# Patient Record
Sex: Female | Born: 1955 | Race: White | Hispanic: No | Marital: Single
Health system: Midwestern US, Community
[De-identification: ages and names within clinical notes are randomized; demographics above are authoritative.]

## PROBLEM LIST (undated history)

## (undated) DIAGNOSIS — R002 Palpitations: Secondary | ICD-10-CM

## (undated) DIAGNOSIS — K469 Unspecified abdominal hernia without obstruction or gangrene: Secondary | ICD-10-CM

## (undated) DIAGNOSIS — I8500 Esophageal varices without bleeding: Secondary | ICD-10-CM

## (undated) DIAGNOSIS — I351 Nonrheumatic aortic (valve) insufficiency: Secondary | ICD-10-CM

## (undated) DIAGNOSIS — D509 Iron deficiency anemia, unspecified: Secondary | ICD-10-CM

## (undated) DIAGNOSIS — R188 Other ascites: Secondary | ICD-10-CM

## (undated) DIAGNOSIS — F329 Major depressive disorder, single episode, unspecified: Secondary | ICD-10-CM

## (undated) DIAGNOSIS — I10 Essential (primary) hypertension: Secondary | ICD-10-CM

## (undated) DIAGNOSIS — K769 Liver disease, unspecified: Secondary | ICD-10-CM

## (undated) DIAGNOSIS — R519 Headache, unspecified: Secondary | ICD-10-CM

## (undated) DIAGNOSIS — E119 Type 2 diabetes mellitus without complications: Secondary | ICD-10-CM

## (undated) DIAGNOSIS — B182 Chronic viral hepatitis C: Secondary | ICD-10-CM

## (undated) DIAGNOSIS — L820 Inflamed seborrheic keratosis: Secondary | ICD-10-CM

## (undated) DIAGNOSIS — K219 Gastro-esophageal reflux disease without esophagitis: Secondary | ICD-10-CM

## (undated) DIAGNOSIS — K7460 Unspecified cirrhosis of liver: Secondary | ICD-10-CM

## (undated) DIAGNOSIS — N390 Urinary tract infection, site not specified: Secondary | ICD-10-CM

## (undated) DIAGNOSIS — F32A Depression, unspecified: Secondary | ICD-10-CM

## (undated) DIAGNOSIS — E785 Hyperlipidemia, unspecified: Secondary | ICD-10-CM

## (undated) DIAGNOSIS — K766 Portal hypertension: Secondary | ICD-10-CM

## (undated) DIAGNOSIS — Q6102 Congenital multiple renal cysts: Secondary | ICD-10-CM

## (undated) DIAGNOSIS — B1920 Unspecified viral hepatitis C without hepatic coma: Secondary | ICD-10-CM

## (undated) DIAGNOSIS — Z889 Allergy status to unspecified drugs, medicaments and biological substances status: Secondary | ICD-10-CM

## (undated) DIAGNOSIS — G5780 Other specified mononeuropathies of unspecified lower limb: Secondary | ICD-10-CM

## (undated) DIAGNOSIS — R51 Headache: Secondary | ICD-10-CM

## (undated) DIAGNOSIS — I499 Cardiac arrhythmia, unspecified: Secondary | ICD-10-CM

## (undated) DIAGNOSIS — K922 Gastrointestinal hemorrhage, unspecified: Secondary | ICD-10-CM

## (undated) DIAGNOSIS — D61818 Other pancytopenia: Secondary | ICD-10-CM

## (undated) DIAGNOSIS — R161 Splenomegaly, not elsewhere classified: Secondary | ICD-10-CM

## (undated) HISTORY — PX: OTHER SURGICAL HISTORY: SHX169

## (undated) HISTORY — PX: WISDOM TOOTH EXTRACTION: SHX21

## (undated) HISTORY — PX: ABDOMINAL HYSTERECTOMY: SHX81

## (undated) HISTORY — PX: CHOLECYSTECTOMY: SHX55

---

## 2017-11-14 NOTE — Progress Notes (Signed)
Formatting of this note might be different from the original.  ? The patient is here today for suture removal. Surgery was performed on 10/30/2017. Surgery location: right side lower back.  ?  Patient has no complaints.  ? Physical Exam: Surgery site healing appropriately with no signs of infection, bleeding, or dehiscence.  ? Sutures removed without complication; Wound edges remained well approximated after suture removal. Patient to return to the clinic: prn or as scheduled. Continue routine wound care and monitor wound for signs of infection. Patient to call with any questions or concerns.      Electronically signed by Hilarie Fredrickson, LPN at 81/19/1478  3:59 PM EST

## 2017-11-21 ENCOUNTER — Other Ambulatory Visit: Payer: Self-pay

## 2017-11-21 ENCOUNTER — Encounter: Payer: Self-pay | Admitting: Emergency Medicine

## 2017-11-21 ENCOUNTER — Emergency Department: Payer: Federal, State, Local not specified - PPO

## 2017-11-21 ENCOUNTER — Inpatient Hospital Stay
Admission: EM | Admit: 2017-11-21 | Discharge: 2017-11-23 | DRG: 369 | Disposition: A | Discharge status: Home / Self Care | Payer: Federal, State, Local not specified - PPO | Attending: Internal Medicine | Admitting: Internal Medicine

## 2017-11-21 DIAGNOSIS — R161 Splenomegaly, not elsewhere classified: Secondary | ICD-10-CM | POA: Diagnosis present

## 2017-11-21 DIAGNOSIS — K3189 Other diseases of stomach and duodenum: Secondary | ICD-10-CM | POA: Diagnosis present

## 2017-11-21 DIAGNOSIS — E119 Type 2 diabetes mellitus without complications: Secondary | ICD-10-CM | POA: Diagnosis present

## 2017-11-21 DIAGNOSIS — K766 Portal hypertension: Secondary | ICD-10-CM | POA: Diagnosis present

## 2017-11-21 DIAGNOSIS — R110 Nausea: Secondary | ICD-10-CM | POA: Diagnosis not present

## 2017-11-21 DIAGNOSIS — Z8619 Personal history of other infectious and parasitic diseases: Secondary | ICD-10-CM | POA: Diagnosis not present

## 2017-11-21 DIAGNOSIS — Z885 Allergy status to narcotic agent status: Secondary | ICD-10-CM

## 2017-11-21 DIAGNOSIS — E876 Hypokalemia: Secondary | ICD-10-CM | POA: Diagnosis not present

## 2017-11-21 DIAGNOSIS — K922 Gastrointestinal hemorrhage, unspecified: Secondary | ICD-10-CM

## 2017-11-21 DIAGNOSIS — D61818 Other pancytopenia: Secondary | ICD-10-CM | POA: Diagnosis present

## 2017-11-21 DIAGNOSIS — R5381 Other malaise: Secondary | ICD-10-CM | POA: Diagnosis not present

## 2017-11-21 DIAGNOSIS — Z886 Allergy status to analgesic agent status: Secondary | ICD-10-CM

## 2017-11-21 DIAGNOSIS — D509 Iron deficiency anemia, unspecified: Secondary | ICD-10-CM | POA: Diagnosis present

## 2017-11-21 DIAGNOSIS — R197 Diarrhea, unspecified: Secondary | ICD-10-CM | POA: Diagnosis present

## 2017-11-21 DIAGNOSIS — R946 Abnormal results of thyroid function studies: Secondary | ICD-10-CM | POA: Diagnosis present

## 2017-11-21 DIAGNOSIS — R5383 Other fatigue: Secondary | ICD-10-CM | POA: Diagnosis not present

## 2017-11-21 DIAGNOSIS — I8501 Esophageal varices with bleeding: Secondary | ICD-10-CM | POA: Diagnosis present

## 2017-11-21 DIAGNOSIS — I10 Essential (primary) hypertension: Secondary | ICD-10-CM | POA: Diagnosis present

## 2017-11-21 DIAGNOSIS — R42 Dizziness and giddiness: Secondary | ICD-10-CM | POA: Diagnosis not present

## 2017-11-21 DIAGNOSIS — K921 Melena: Secondary | ICD-10-CM | POA: Diagnosis not present

## 2017-11-21 DIAGNOSIS — R21 Rash and other nonspecific skin eruption: Secondary | ICD-10-CM | POA: Diagnosis present

## 2017-11-21 DIAGNOSIS — Z79899 Other long term (current) drug therapy: Secondary | ICD-10-CM | POA: Diagnosis not present

## 2017-11-21 DIAGNOSIS — K7460 Unspecified cirrhosis of liver: Secondary | ICD-10-CM | POA: Diagnosis present

## 2017-11-21 DIAGNOSIS — B1920 Unspecified viral hepatitis C without hepatic coma: Secondary | ICD-10-CM | POA: Diagnosis not present

## 2017-11-21 DIAGNOSIS — R531 Weakness: Secondary | ICD-10-CM | POA: Diagnosis not present

## 2017-11-21 DIAGNOSIS — Z880 Allergy status to penicillin: Secondary | ICD-10-CM | POA: Diagnosis not present

## 2017-11-21 HISTORY — DX: Unspecified viral hepatitis C without hepatic coma: B19.20

## 2017-11-21 HISTORY — DX: Type 2 diabetes mellitus without complications: E11.9

## 2017-11-21 HISTORY — DX: Essential (primary) hypertension: I10

## 2017-11-21 LAB — DIFFERENTIAL
BASOS ABS: 0 10*3/uL (ref 0–0.1)
BLASTS: 0 %
Band Neutrophils: 0 %
Basophils Relative: 0 %
Eosinophils Absolute: 0.2 10*3/uL (ref 0–0.7)
Eosinophils Relative: 10 %
LYMPHS ABS: 0.4 10*3/uL — AB (ref 1.0–3.6)
Lymphocytes Relative: 16 %
METAMYELOCYTES PCT: 0 %
MYELOCYTES: 0 %
Monocytes Absolute: 0.2 10*3/uL (ref 0.2–0.9)
Monocytes Relative: 10 %
NEUTROS PCT: 64 %
NRBC: 0 /100{WBCs}
Neutro Abs: 1.5 10*3/uL (ref 1.4–6.5)
Other: 0 %
PROMYELOCYTES ABS: 0 %

## 2017-11-21 LAB — URINALYSIS, COMPLETE (UACMP) WITH MICROSCOPIC
Bilirubin Urine: NEGATIVE
Glucose, UA: NEGATIVE mg/dL
KETONES UR: NEGATIVE mg/dL
Leukocytes, UA: NEGATIVE
Nitrite: NEGATIVE
PH: 6 (ref 5.0–8.0)
Protein, ur: NEGATIVE mg/dL
Specific Gravity, Urine: 1.032 — ABNORMAL HIGH (ref 1.005–1.030)

## 2017-11-21 LAB — COMPREHENSIVE METABOLIC PANEL
ALT: 24 U/L (ref 14–54)
AST: 55 U/L — AB (ref 15–41)
Albumin: 2.8 g/dL — ABNORMAL LOW (ref 3.5–5.0)
Alkaline Phosphatase: 131 U/L — ABNORMAL HIGH (ref 38–126)
Anion gap: 7 (ref 5–15)
BUN: 10 mg/dL (ref 6–20)
CHLORIDE: 106 mmol/L (ref 101–111)
CO2: 19 mmol/L — ABNORMAL LOW (ref 22–32)
CREATININE: 0.54 mg/dL (ref 0.44–1.00)
Calcium: 8.9 mg/dL (ref 8.9–10.3)
GFR calc Af Amer: >60 mL/min (ref >60)
GFR calc non Af Amer: >60 mL/min (ref >60)
Glucose, Bld: 167 mg/dL — ABNORMAL HIGH (ref 65–99)
Potassium: 3.5 mmol/L (ref 3.5–5.1)
SODIUM: 132 mmol/L — AB (ref 135–145)
Total Bilirubin: 2.1 mg/dL — ABNORMAL HIGH (ref 0.3–1.2)
Total Protein: 7.4 g/dL (ref 6.5–8.1)

## 2017-11-21 LAB — CBC
HCT: 32.5 % — ABNORMAL LOW (ref 35.0–47.0)
Hemoglobin: 10.6 g/dL — ABNORMAL LOW (ref 12.0–16.0)
MCH: 28.6 pg (ref 26.0–34.0)
MCHC: 32.5 g/dL (ref 32.0–36.0)
MCV: 87.8 fL (ref 80.0–100.0)
PLATELETS: 64 10*3/uL — AB (ref 150–440)
RBC: 3.7 MIL/uL — AB (ref 3.80–5.20)
RDW: 19.1 % — AB (ref 11.5–14.5)
WBC: 2.3 10*3/uL — ABNORMAL LOW (ref 3.6–11.0)

## 2017-11-21 LAB — GLUCOSE, CAPILLARY
GLUCOSE-CAPILLARY: 180 mg/dL — AB (ref 65–99)
Glucose-Capillary: 171 mg/dL — ABNORMAL HIGH (ref 65–99)

## 2017-11-21 LAB — LIPASE, BLOOD: LIPASE: 136 U/L — AB (ref 11–51)

## 2017-11-21 LAB — RETICULOCYTES
RBC.: 3.33 MIL/uL — ABNORMAL LOW (ref 3.80–5.20)
Retic Count, Absolute: 83.3 10*3/uL (ref 19.0–183.0)
Retic Ct Pct: 2.5 % (ref 0.4–3.1)

## 2017-11-21 LAB — TSH: TSH: 0.02 u[IU]/mL — ABNORMAL LOW (ref 0.350–4.500)

## 2017-11-21 LAB — FOLATE: Folate: 10 ng/mL (ref >5.9)

## 2017-11-21 LAB — IRON AND TIBC
Iron: 61 ug/dL (ref 28–170)
SATURATION RATIOS: 17 % (ref 10.4–31.8)
TIBC: 359 ug/dL (ref 250–450)
UIBC: 298 ug/dL

## 2017-11-21 LAB — FERRITIN: FERRITIN: 28 ng/mL (ref 11–307)

## 2017-11-21 LAB — HEMOGLOBIN: HEMOGLOBIN: 9.8 g/dL — AB (ref 12.0–16.0)

## 2017-11-21 IMAGING — CT CT ABD-PELV W/ CM
2 of 5 series · 14 of 46 positions shown, 16 images · IV contrast (APPLIED)
Comparison: None.

CLINICAL DATA: 61-year-old with current history of hepatitis-C,
hypertension and diabetes, presenting with acute onset of diarrhea
today.

EXAM:
CT ABDOMEN AND PELVIS WITH CONTRAST
TECHNIQUE: Multidetector CT imaging of the abdomen and pelvis was performed
using the standard protocol following bolus administration of
intravenous contrast.
CONTRAST:  100mL [XV] IOPAMIDOL INJECTION 61% IV. Oral
contrast was not administered.

[Series 2: axial st · axial · 0.73mm/px · z∈[-1158,-768]mm · 11 of 90 slices shown, 13 images]
[im 6/90  soft-tissue]
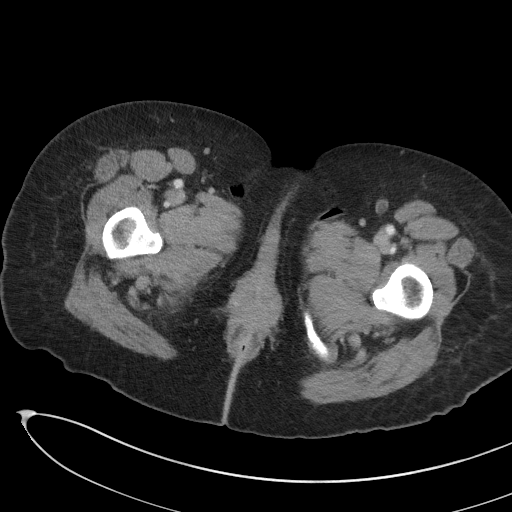
[im 6/90  bone]
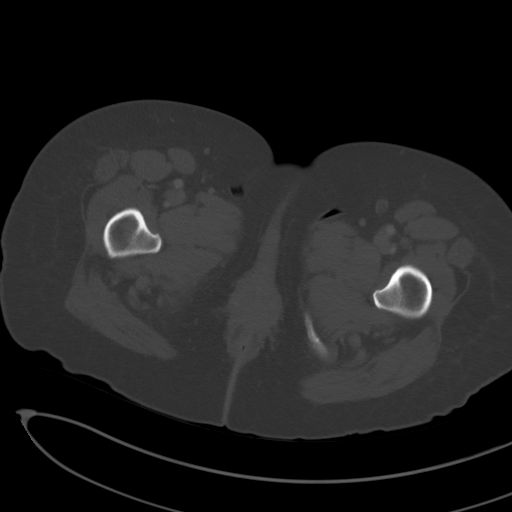
[im 17/90  soft-tissue]
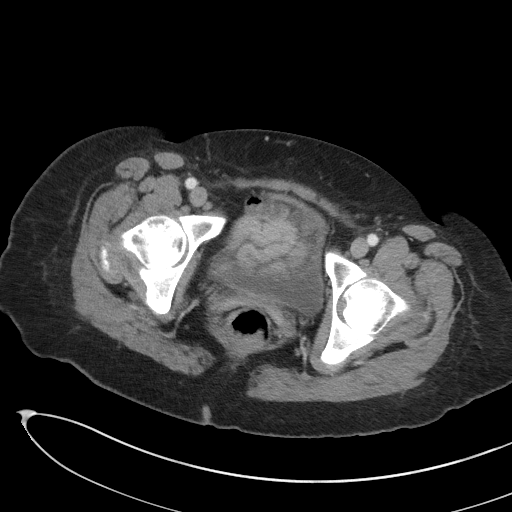
[im 23/90  soft-tissue]
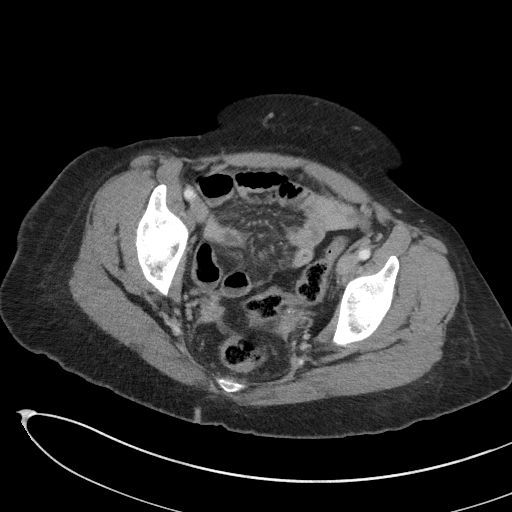
[im 28/90  soft-tissue]
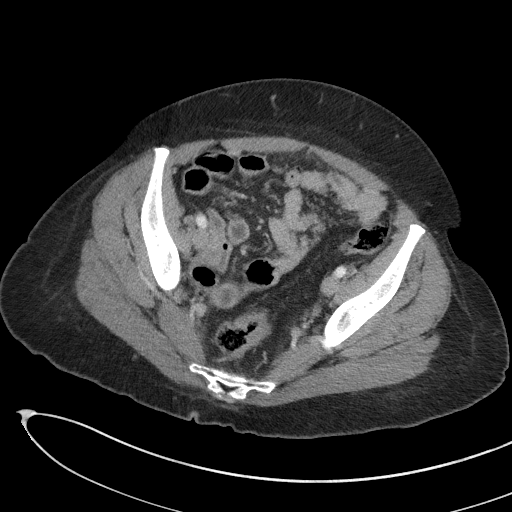
[im 39/90  soft-tissue]
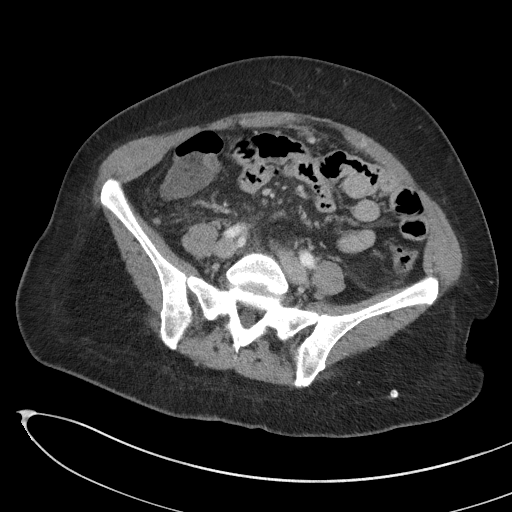
[im 45/90  soft-tissue]
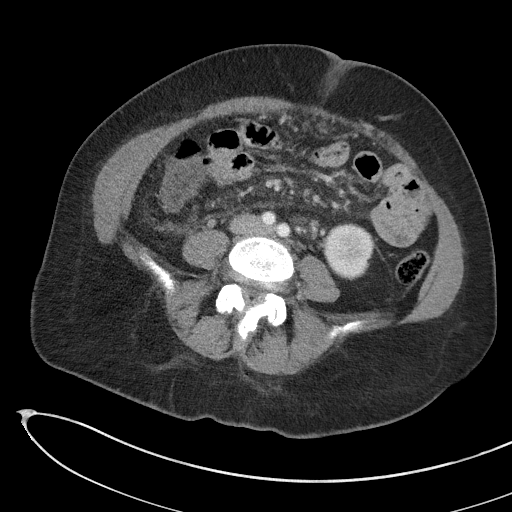
[im 51/90  soft-tissue]
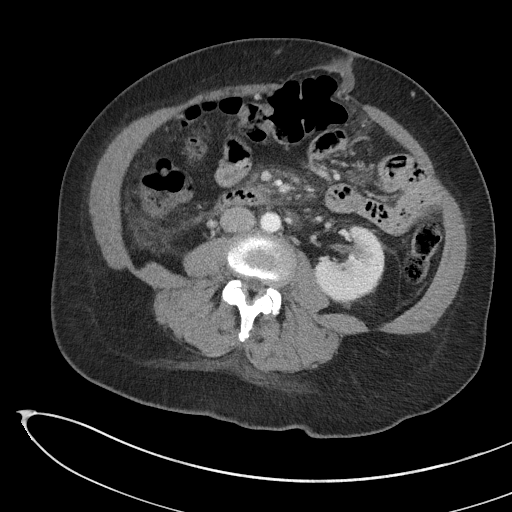
[im 62/90  soft-tissue]
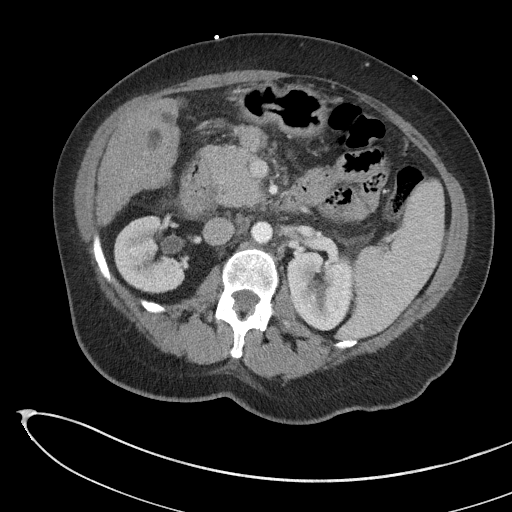
[im 67/90  soft-tissue]
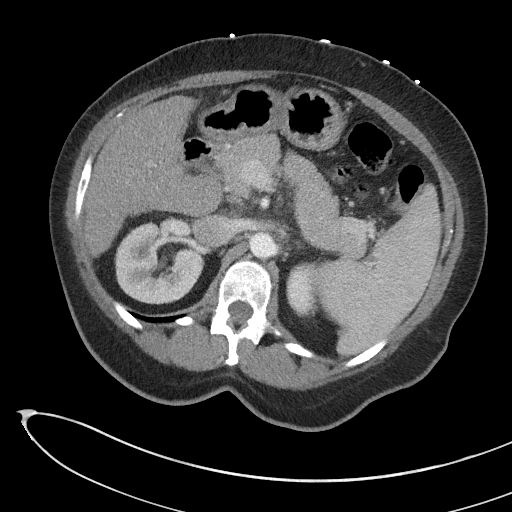
[im 67/90  bone]
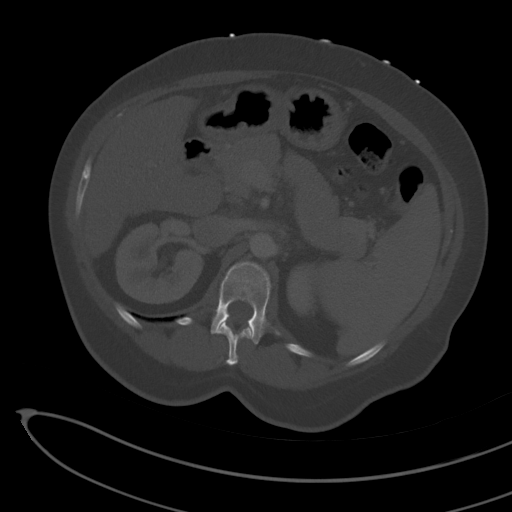
[im 73/90  soft-tissue]
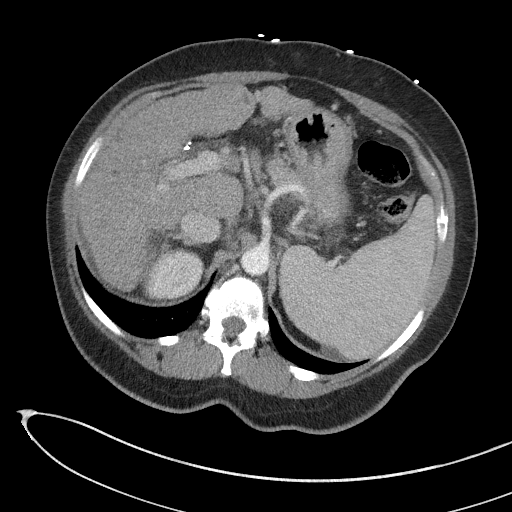
[im 84/90  soft-tissue]
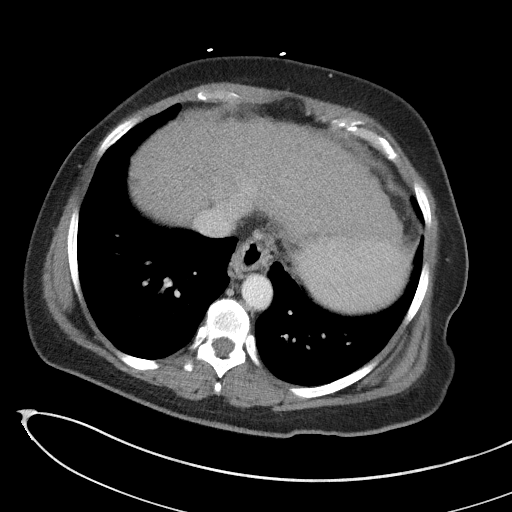

[Series 5: coronal st · coronal · 0.68mm/px · 3 of 96 slices shown]
[im 32/96  soft-tissue]
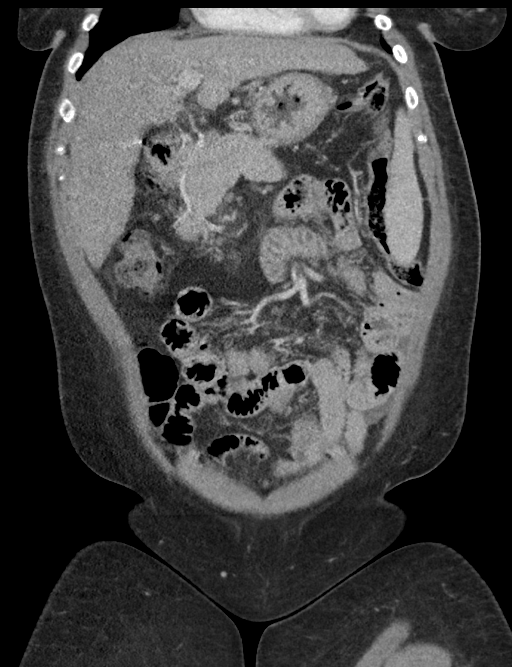
[im 43/96  soft-tissue]
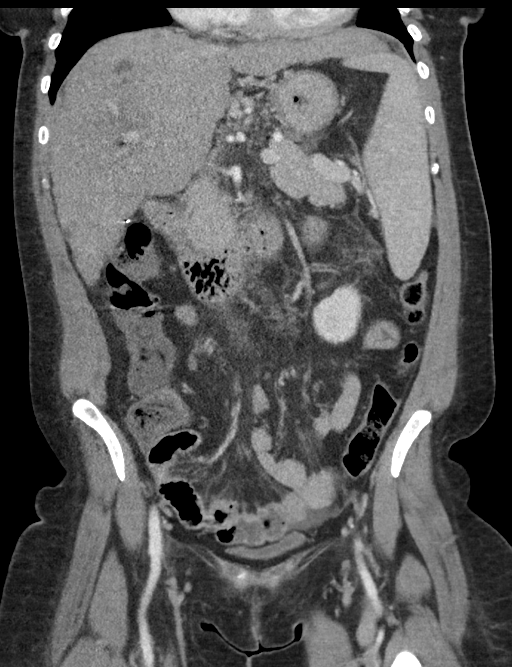
[im 53/96  soft-tissue]
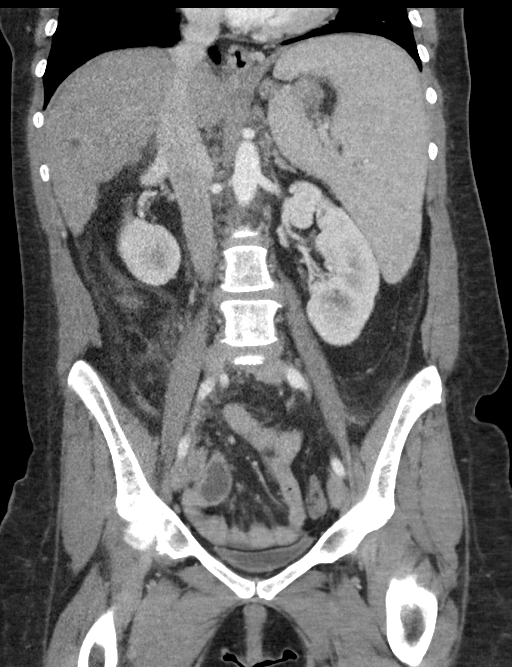

[14 of 46 positions shown; findings below may reference images not displayed]

FINDINGS: Lower chest: Heart size normal. Visualized lung bases clear. Varices
involving the distal esophagus.

Hepatobiliary: Irregularity of the hepatic contour and marked
enlargement of the caudate lobe. Multiple low-attenuation masses
throughout both lobes of the liver, the largest measuring
approximately 1.5 cm in the posterior segment right lobe. The
largest masses represent cysts with Hounsfield units 0-10, making it
likely that the smaller masses are cysts as well. Mild diffuse
hepatic steatosis.

Surgically absent gallbladder. No unexpected biliary ductal
dilation.

Pancreas: Normal in appearance without evidence of mass, ductal
dilation, or inflammation.

Spleen: Splenomegaly, with the spleen measuring approximately 13.7 x
6.4 x 17.3 cm, yielding a volume of approximately 758 mL. No focal
splenic parenchymal abnormality.

Adrenals/Urinary Tract: Normal appearing adrenal glands. Mild right
hydronephrosis without evidence of right ureteral dilation. Mild
left hydronephrosis without evidence of left ureteral dilation. No
urinary tract calculi on either side. Benign cyst involving the
lower pole of the left kidney measuring approximately 1.1 x 1.9 cm.
No solid mass involving either kidney. Normal appearing decompressed
urinary bladder.

Stomach/Bowel: Stomach decompressed and unremarkable.
Normal-appearing small bowel. Expected colonic stool burden. Liquid
stool in the ascending colon. No focal abnormality involving the
colon. Normal appendix in the right upper pelvis.

Vascular/Lymphatic: No visible aortoiliofemoral atherosclerosis.
Normal-appearing portal venous and systemic venous systems. Gastric
varices along the lesser curvature. Recanalization of the umbilical
vein.

No pathologic lymphadenopathy.

Reproductive: Uterus not visualized and presumed surgically absent.
No adnexal masses.

Other: Periumbilical hernia containing the gas-filled transverse
colon without evidence of incarceration. Adjacent umbilical hernia
containing fat.

Edema at the root of the mesentery.  No ascites.

Musculoskeletal: Degenerative disc disease and spondylosis at
T11-12. Facet degenerative changes on the right at L3-4 and L4-5.
Severe multifactorial spinal stenosis at L4-5.
IMPRESSION: 1. Hepatic cirrhosis. Multiple hepatic masses, the largest of which
are cysts, making it likely that the smaller masses are also cysts.
2. Patent portal vein and patent splenic vein.
3. Portal venous hypertension with gastric varices, esophageal
varices and marked splenomegaly.
4. Mild bilateral hydronephrosis, right greater than left, without
associated ureteral dilation. Therefore, congenital mild UPJ
stenoses are likely. No evidence of urinary tract calculi.
5. Periumbilical hernia containing a portion of the gas-filled
transverse colon without evidence of incarceration.
6. Nonspecific edema at the root of the mesentery. This is likely
related to the portal hypertension.
7. Multifactorial spinal stenosis at L4-5.

## 2017-11-21 MED ORDER — ACETAMINOPHEN 650 MG RE SUPP: 650.0000 mg | Freq: Four times a day (QID) | RECTAL | Status: DC | PRN

## 2017-11-21 MED ORDER — SENNOSIDES-DOCUSATE SODIUM 8.6-50 MG PO TABS: 1.0000 | ORAL_TABLET | Freq: Every evening | ORAL | Status: DC | PRN

## 2017-11-21 MED ORDER — ONDANSETRON HCL 4 MG PO TABS: 4.0000 mg | ORAL_TABLET | Freq: Four times a day (QID) | ORAL | Status: DC | PRN

## 2017-11-21 MED ORDER — ACETAMINOPHEN 325 MG PO TABS: 650.0000 mg | ORAL_TABLET | Freq: Four times a day (QID) | ORAL | Status: DC | PRN

## 2017-11-21 MED ORDER — SODIUM CHLORIDE 0.9 % IV BOLUS (SEPSIS)
1000.0000 mL | Freq: Once | INTRAVENOUS | Status: AC
  Administered 2017-11-21: 1000 mL via INTRAVENOUS

## 2017-11-21 MED ORDER — TRAMADOL HCL 50 MG PO TABS
50.0000 mg | ORAL_TABLET | Freq: Four times a day (QID) | ORAL | Status: DC | PRN
  Administered 2017-11-22: 13:00:00 50 mg via ORAL
  Filled 2017-11-21: qty 1

## 2017-11-21 MED ORDER — PANTOPRAZOLE SODIUM 40 MG IV SOLR: 40.0000 mg | Freq: Two times a day (BID) | INTRAVENOUS | Status: DC

## 2017-11-21 MED ORDER — INSULIN ASPART 100 UNIT/ML ~~LOC~~ SOLN: 0.0000 [IU] | Freq: Three times a day (TID) | SUBCUTANEOUS | Status: DC

## 2017-11-21 MED ORDER — HYDRALAZINE HCL 20 MG/ML IJ SOLN: 10.0000 mg | Freq: Four times a day (QID) | INTRAMUSCULAR | Status: DC | PRN

## 2017-11-21 MED ORDER — PANTOPRAZOLE SODIUM 40 MG IV SOLR
80.0000 mg | Freq: Once | INTRAVENOUS | Status: AC
  Administered 2017-11-21: 80 mg via INTRAVENOUS
  Filled 2017-11-21: qty 80

## 2017-11-21 MED ORDER — AMLODIPINE BESYLATE 5 MG PO TABS
5.0000 mg | ORAL_TABLET | Freq: Every day | ORAL | Status: DC
  Administered 2017-11-21 – 2017-11-23 (×3): 5 mg via ORAL
  Filled 2017-11-21 (×3): qty 1

## 2017-11-21 MED ORDER — SODIUM CHLORIDE 0.9 % IV SOLN
8.0000 mg/h | INTRAVENOUS | Status: DC
  Administered 2017-11-21 – 2017-11-23 (×4): 8 mg/h via INTRAVENOUS
  Filled 2017-11-21 (×5): qty 80

## 2017-11-21 MED ORDER — ONDANSETRON HCL 4 MG/2ML IJ SOLN: 4.0000 mg | Freq: Four times a day (QID) | INTRAMUSCULAR | Status: DC | PRN

## 2017-11-21 MED ORDER — SODIUM CHLORIDE 0.9 % IV SOLN
INTRAVENOUS | Status: DC
  Administered 2017-11-21 – 2017-11-23 (×4): via INTRAVENOUS

## 2017-11-21 MED ORDER — BISACODYL 10 MG RE SUPP: 10.0000 mg | Freq: Every day | RECTAL | Status: DC | PRN

## 2017-11-21 MED ORDER — IOPAMIDOL (ISOVUE-300) INJECTION 61%
100.0000 mL | Freq: Once | INTRAVENOUS | Status: AC | PRN
  Administered 2017-11-21: 100 mL via INTRAVENOUS

## 2017-11-21 MED ORDER — INSULIN ASPART 100 UNIT/ML ~~LOC~~ SOLN
0.0000 [IU] | Freq: Three times a day (TID) | SUBCUTANEOUS | Status: DC
  Administered 2017-11-22: 18:00:00 1 [IU] via SUBCUTANEOUS
  Filled 2017-11-21: qty 1

## 2017-11-21 NOTE — ED Notes (Signed)
Patient transported to 128 

## 2017-11-21 NOTE — ED Provider Notes (Signed)
[facility] Emergency Department Provider Note  ____________________________________________  Time seen: Approximately 3:35 PM  I have reviewed the triage vital signs and the nursing notes.   HISTORY  Chief Complaint Diarrhea   HPI Roberta Lane is a 62 y.o. female with history of diabetes, fully treated hepatitis C in 2017, and hypertension who presents for evaluation of diarrhea. Patient reports intermittent diarrhea for 10 days. For the last 3 days the diarrhea has become more pronounced. Today she had 7 episodes of diarrhea. She describes the diarrhea as blackand watery. No prior h/o GIB, not on blood thinners, no NSAID use. She also reports mild RLQ abdominal pain yesterday which she describes as dull and constant but that has resolved. No recent abx use no h/o C. Diff. Patient recently moved here from [place] and has no prior medical records with her. no vomiting, fever, chest pain, shortness of breath, dysuria.  Past Medical History:  Diagnosis Date  . Diabetes mellitus without complication ([place])   . Hepatitis C   . Hypertension     Patient Active Problem List   Diagnosis Date Noted  . GIB (gastrointestinal bleeding) 11/21/2017    History reviewed. No pertinent surgical history.  Prior to Admission medications   Medication Sig Start Date End Date Taking? Authorizing Provider  enalapril (VASOTEC) 2.5 MG tablet Take 2.5 mg by mouth daily.   Yes [provider]  metFORMIN (GLUCOPHAGE) 1000 MG tablet Take 1,000 mg by mouth 2 (two) times daily.   Yes [provider]    Allergies Penicillins; Codeine; and Tylenol [acetaminophen]  No family history on file.  Social History Social History   Tobacco Use  . Smoking status: Never Smoker  . Smokeless tobacco: Never Used  Substance Use Topics  . Alcohol use: Yes  . Drug use: No    Review of Systems  Constitutional: Negative for fever. Eyes: Negative for visual  changes. ENT: Negative for sore throat. Neck: No neck pain  Cardiovascular: Negative for chest pain. Respiratory: Negative for shortness of breath. Gastrointestinal: + abdominal pain and diarrhea. No vomiting  Genitourinary: Negative for dysuria. Musculoskeletal: Negative for back pain. Skin: Negative for rash. Neurological: Negative for headaches, weakness or numbness. Psych: No SI or HI  ____________________________________________   PHYSICAL EXAM:  VITAL SIGNS: ED Triage Vitals  Enc Vitals Group     BP 11/21/17 1316 (!) 150/70     Pulse Rate 11/21/17 1316 94     Resp 11/21/17 1316 20     Temp 11/21/17 1316 99.4 F (37.4 C)     Temp Source 11/21/17 1316 Oral     SpO2 11/21/17 1316 96 %     Weight 11/21/17 1315 165 lb (74.8 kg)     Height 11/21/17 1315 5\' 4"  (1.626 m)     Head Circumference --      Peak Flow --      Pain Score 11/21/17 1330 5     Pain Loc --      Pain Edu? --      Excl. in [place]? --     Constitutional: Alert and oriented. Well appearing and in no apparent distress. HEENT:      Head: Normocephalic and atraumatic.         Eyes: Conjunctivae are normal. Sclera is non-icteric.       Mouth/Throat: Mucous membranes are moist.       Neck: Supple with no signs of meningismus. Cardiovascular: Regular rate and rhythm. No murmurs, gallops, or  rubs. 2+ symmetrical distal pulses are present in all extremities. No JVD. Respiratory: Normal respiratory effort. Lungs are clear to auscultation bilaterally. No wheezes, crackles, or rhonchi.  Gastrointestinal: Soft, non tender, and non distended with positive bowel sounds. No rebound or guarding. Genitourinary: No CVA tenderness. rectal exam showing dark stool guaiac positive Musculoskeletal: Nontender with normal range of motion in all extremities. No edema, cyanosis, or erythema of extremities. Neurologic: Normal speech and language. Face is symmetric. Moving all extremities. No gross focal neurologic deficits are  appreciated. Skin: Skin is warm, dry and intact. No rash noted. Psychiatric: Mood and affect are normal. Speech and behavior are normal.  ____________________________________________   LABS (all labs ordered are listed, but only abnormal results are displayed)  Labs Reviewed  LIPASE, BLOOD - Abnormal; Notable for the following components:      Result Value   Lipase 136 (*)    All other components within normal limits  COMPREHENSIVE METABOLIC PANEL - Abnormal; Notable for the following components:   Sodium 132 (*)    CO2 19 (*)    Glucose, Bld 167 (*)    Albumin 2.8 (*)    AST 55 (*)    Alkaline Phosphatase 131 (*)    Total Bilirubin 2.1 (*)    All other components within normal limits  CBC - Abnormal; Notable for the following components:   WBC 2.3 (*)    RBC 3.70 (*)    Hemoglobin 10.6 (*)    HCT 32.5 (*)    RDW 19.1 (*)    Platelets 64 (*)    All other components within normal limits  C DIFFICILE QUICK SCREEN W PCR REFLEX  URINALYSIS, COMPLETE (UACMP) WITH MICROSCOPIC  DIFFERENTIAL  TYPE AND SCREEN   ____________________________________________  EKG  ED ECG REPORT I, Rudene Re, the attending physician, personally viewed and interpreted this ECG.  Normal sinus rhythm with frequent PVCs, rate of 94, normal intervals, normal axis, no ST elevations or depressions. ____________________________________________  RADIOLOGY  CT a/p: Interpreted by me: Cirrhosis of the liver with several hepatic masses and splenomegaly.    Interpretation by Radiologist:  Ct Abdomen Pelvis W Contrast  Result Date: 11/21/2017 CLINICAL DATA:  62 year old with current history of hepatitis-C, hypertension and diabetes, presenting with acute onset of diarrhea today. EXAM: CT ABDOMEN AND PELVIS WITH CONTRAST TECHNIQUE: Multidetector CT imaging of the abdomen and pelvis was performed using the standard protocol following bolus administration of intravenous contrast. CONTRAST:  134mL  ISOVUE-300 IOPAMIDOL INJECTION 61% IV. Oral contrast was not administered. COMPARISON:  None. FINDINGS: Lower chest: Heart size normal. Visualized lung bases clear. Varices involving the distal esophagus. Hepatobiliary: Irregularity of the hepatic contour and marked enlargement of the caudate lobe. Multiple low-attenuation masses throughout both lobes of the liver, the largest measuring approximately 1.5 cm in the posterior segment right lobe. The largest masses represent cysts with Hounsfield units 0-10, making it likely that the smaller masses are cysts as well. Mild diffuse hepatic steatosis. Surgically absent gallbladder. No unexpected biliary ductal dilation. Pancreas: Normal in appearance without evidence of mass, ductal dilation, or inflammation. Spleen: Splenomegaly, with the spleen measuring approximately 13.7 x 6.4 x 17.3 cm, yielding a volume of approximately 758 mL. No focal splenic parenchymal abnormality. Adrenals/Urinary Tract: Normal appearing adrenal glands. Mild right hydronephrosis without evidence of right ureteral dilation. Mild left hydronephrosis without evidence of left ureteral dilation. No urinary tract calculi on either side. Benign cyst involving the lower pole of the left kidney measuring approximately 1.1 x  1.9 cm. No solid mass involving either kidney. Normal appearing decompressed urinary bladder. Stomach/Bowel: Stomach decompressed and unremarkable. Normal-appearing small bowel. Expected colonic stool burden. Liquid stool in the ascending colon. No focal abnormality involving the colon. Normal appendix in the right upper pelvis. Vascular/Lymphatic: No visible aortoiliofemoral atherosclerosis. Normal-appearing portal venous and systemic venous systems. Gastric varices along the lesser curvature. Recanalization of the umbilical vein. No pathologic lymphadenopathy. Reproductive: Uterus not visualized and presumed surgically absent. No adnexal masses. Other: Periumbilical hernia  containing the gas-filled transverse colon without evidence of incarceration. Adjacent umbilical hernia containing fat. Edema at the root of the mesentery.  No ascites. Musculoskeletal: Degenerative disc disease and spondylosis at T11-12. Facet degenerative changes on the right at L3-4 and L4-5. Severe multifactorial spinal stenosis at L4-5. IMPRESSION: 1. Hepatic cirrhosis. Multiple hepatic masses, the largest of which are cysts, making it likely that the smaller masses are also cysts. 2. Patent portal vein and patent splenic vein. 3. Portal venous hypertension with gastric varices, esophageal varices and marked splenomegaly. 4. Mild bilateral hydronephrosis, right greater than left, without associated ureteral dilation. 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 No evidence of urinary tract calculi. 5. Periumbilical hernia containing a portion of the gas-filled transverse colon without evidence of incarceration. 6. Nonspecific edema at the root of the mesentery. This is likely related to the portal hypertension. 7. Multifactorial spinal stenosis at L4-5. Electronically Signed   By: Evangeline Dakin [name]   On: 11/21/2017 16:31      ____________________________________________   PROCEDURES  Procedure(s) performed: None Procedures Critical Care performed:  None ____________________________________________   INITIAL IMPRESSION / ASSESSMENT AND PLAN / ED COURSE   62 y.o. female with history of diabetes, fully treated hepatitis C in 2017, and hypertension who presents for evaluation of diarrhea/ melena, 7 episodes today. patient's rectal exam shows melena which is grossly guaiac positive, her abdomen is soft and nontender, her vitals are stable. patient has several lab abnormalities including pancytopenia, slightly elevated AST and T bili, elevated lipase. Patient reports that she was fully treated for hepatitis C in 2017. She has had her gallbladder removed several years ago. Unfortunately we  don't have access to her prior medical records as she recently moved here from [place]. However patient with a upper GI bleed and thrombocytopenic making inpatient admission required at this time. Will check for C. Diff, start protonix bolus and drip, and give IVF. Will get CT a/p to evaluate for any liver/ pancreas abnormaalitie based on lab work.       As part of my medical decision making, I reviewed the following data within the [place] notes reviewed and incorporated, Labs reviewed , EKG interpreted , Radiograph reviewed , Discussed with admitting physician , Notes from prior ED visits and [place] Controlled Substance Database    Pertinent labs & imaging results that were available during my care of the patient were reviewed by me and considered in my medical decision making (see chart for details).    ____________________________________________   FINAL CLINICAL IMPRESSION(S) / ED DIAGNOSES  Final diagnoses:  Pancytopenia ([place])  UGIB (upper gastrointestinal bleed)  Cirrhosis of liver without ascites, unspecified hepatic cirrhosis type ([place])      NEW MEDICATIONS STARTED DURING THIS VISIT:  ED Discharge Orders    None       Note:  This document was prepared using Dragon voice recognition software and may include unintentional dictation errors.    Alfred Levins, [place], MD 11/21/17 [phone]

## 2017-11-21 NOTE — ED Triage Notes (Signed)
Pt to ed with c/o diarrhea today x 7 times, pt also c/o no appetite, sore throat, and headache.  Pt states she has appt with PMD on Feb 20th but was told to come here today.

## 2017-11-21 NOTE — ED Notes (Signed)
FN: pt arrives from [facility] for further eval of diarrhea x1 week.

## 2017-11-21 NOTE — H&P (Signed)
Roberta Lane at [place] NAME: Roberta Lane    MR#:  [id number]  DATE OF BIRTH:  26-Nov-1955  DATE OF ADMISSION:  11/21/2017  PRIMARY CARE PHYSICIAN: Will be Dr Kary Kos   REQUESTING/REFERRING PHYSICIAN: dr Alfred Levins  CHIEF COMPLAINT:  Diarrhea and weakness    HISTORY OF PRESENT ILLNESS:  Roberta Lane  is a 62 y.o. female with a known history of hepatitis see which has been fully treated since 2017 and diabetes who presents due to diarrhea and generalized weakness. Patient reports over the past week she has had headache, nausea and generalized weakness along with diarrhea. Only today she noticed very dark colored stools. She also endorses some midepigastric abdominal pain. In the ER she was guaiac positive. Patient denies taking NSAIDs or aspirin.She does endorse generalized weakness with feelings of lightheadedness. Patient recently moved to this area and will establish primary care with Dr. Kary Kos.  PAST MEDICAL HISTORY:   Past Medical History:  Diagnosis Date  . Diabetes mellitus without complication ([place])   . Hepatitis C   . Hypertension     PAST SURGICAL HISTORY:  none  SOCIAL HISTORY:   Social History   Tobacco Use  . Smoking status: Never Smoker  . Smokeless tobacco: Never Used  Substance Use Topics  . Alcohol use: Yes    FAMILY HISTORY:  Positive for hypertension  DRUG ALLERGIES:   Allergies  Allergen Reactions  . Penicillins Anaphylaxis  . Codeine Itching  . Tylenol [Acetaminophen] Other (See Comments)    Breast pain    REVIEW OF SYSTEMS:   Review of Systems  Constitutional: Positive for malaise/fatigue. Negative for chills and fever.  HENT: Negative.  Negative for ear discharge, ear pain, hearing loss, nosebleeds and sore throat.   Eyes: Negative.  Negative for blurred vision and pain.  Respiratory: Negative.  Negative for cough, hemoptysis, shortness of breath and wheezing.   Cardiovascular:  Negative.  Negative for chest pain, palpitations and leg swelling.  Gastrointestinal: Positive for abdominal pain, diarrhea, melena and nausea. Negative for blood in stool and vomiting.  Genitourinary: Negative.  Negative for dysuria.  Musculoskeletal: Negative.  Negative for back pain.  Skin: Negative.   Neurological: Positive for dizziness, weakness and headaches. Negative for tremors, speech change, focal weakness and seizures.  Endo/Heme/Allergies: Negative.  Does not bruise/bleed easily.  Psychiatric/Behavioral: Negative.  Negative for depression, hallucinations and suicidal ideas.    MEDICATIONS AT HOME:   Prior to Admission medications   Medication Sig Start Date End Date Taking? Authorizing Provider  enalapril (VASOTEC) 2.5 MG tablet Take 2.5 mg by mouth daily.   Yes [provider]  metFORMIN (GLUCOPHAGE) 1000 MG tablet Take 1,000 mg by mouth 2 (two) times daily.   Yes [provider]      VITAL SIGNS:  Blood pressure (!) 150/70, pulse 94, temperature 99.4 F (37.4 C), temperature source Oral, resp. rate 20, height 5\' 4"  (1.626 m), weight 74.8 kg (165 lb), SpO2 96 %.  PHYSICAL EXAMINATION:   Physical Exam  Constitutional: She is oriented to person, place, and time and well-developed, well-nourished, and in no distress. No distress.  HENT:  Head: Normocephalic.  Eyes: No scleral icterus.  Neck: Normal range of motion. Neck supple. No JVD present. No tracheal deviation present.  Cardiovascular: Normal rate, regular rhythm and normal heart sounds. Exam reveals no gallop and no friction rub.  No murmur heard. Pulmonary/Chest: Effort normal and breath sounds normal. No respiratory distress. She has no  wheezes. She has no rales. She exhibits no tenderness.  Abdominal: Soft. Bowel sounds are normal. She exhibits no distension and no mass. There is no tenderness. There is no rebound and no guarding.  Musculoskeletal: Normal range of motion. She exhibits no edema.   Neurological: She is alert and oriented to person, place, and time.  Skin: Skin is warm. No rash noted. No erythema.  Psychiatric: Affect and judgment normal.      LABORATORY PANEL:   CBC Recent Labs  Lab 11/21/17 1331  WBC 2.3*  HGB 10.6*  HCT 32.5*  PLT 64*   ------------------------------------------------------------------------------------------------------------------  Chemistries  Recent Labs  Lab 11/21/17 1331  NA 132*  K 3.5  CL 106  CO2 19*  GLUCOSE 167*  BUN 10  CREATININE 0.54  CALCIUM 8.9  AST 55*  ALT 24  ALKPHOS 131*  BILITOT 2.1*   ------------------------------------------------------------------------------------------------------------------  Cardiac Enzymes No results for input(s): TROPONINI in the last 168 hours. ------------------------------------------------------------------------------------------------------------------  RADIOLOGY:  No results found.  EKG:  Sinus rhythm with PVCs no ST elevation or depression  IMPRESSION AND PLAN:   62 year old female with history of diabetes, essential hypertension and hepatitis C which was fully treated in 2017 who presents to generalized weakness and diarrhea.   1. Melena: Patient will need to undergo EGD Continue PPI Monitor hemoglobin every 6 hours GI consultation requested  2. Pancytopenia of unclear etiology Oncology evaluation requested Anemia panel ordered along with TSH and B12 Follow up on CT abdomen to evaluate underlying mass  3. Essential hypertension: Patient had a rash with enalapril. She saw dermatologist and had a punch biopsy. I will start Norvasc 5 mg daily. Continue to monitor blood pressure.  4. Diabetes: Start sliding scale Hold metformin   All the records are reviewed and case discussed with ED provider. Management plans discussed with the patient and she is in agreement  CODE STATUS: full  TOTAL TIME TAKING CARE OF THIS PATIENT: 42 minutes.    Roberta Lane,  Roberta Sgro M.D on 11/21/2017 at 4:11 PM  Between 7am to 6pm - Pager - [phone]  After 6pm go to [email or web address] - password [email or web address] [facility]  Office  [phone]  CC: Primary care physician; Dr. Kary Kos

## 2017-11-21 NOTE — ED Notes (Signed)
Pt reports that she has had diarrhea off an on for the past week, but that it got progressively worse today.  Pt reports that today it was straight liquid and that she noticed that it was much darker in color than normal.  Pt reports that she had a history of Hep C, but that she was cured in 2017.  Pt is A&Ox4, in NAD.  Pt used rest room when she came into room from triage.  Pt reported to alert this RN when she has to use the bathroom again so that a sample can be obtained.  Pt verbalized understanding.

## 2017-11-22 DIAGNOSIS — R5383 Other fatigue: Secondary | ICD-10-CM

## 2017-11-22 DIAGNOSIS — D61818 Other pancytopenia: Secondary | ICD-10-CM

## 2017-11-22 DIAGNOSIS — R5381 Other malaise: Secondary | ICD-10-CM

## 2017-11-22 DIAGNOSIS — R531 Weakness: Secondary | ICD-10-CM

## 2017-11-22 DIAGNOSIS — B1920 Unspecified viral hepatitis C without hepatic coma: Secondary | ICD-10-CM

## 2017-11-22 DIAGNOSIS — R42 Dizziness and giddiness: Secondary | ICD-10-CM

## 2017-11-22 DIAGNOSIS — R110 Nausea: Secondary | ICD-10-CM

## 2017-11-22 DIAGNOSIS — I10 Essential (primary) hypertension: Secondary | ICD-10-CM

## 2017-11-22 DIAGNOSIS — K921 Melena: Secondary | ICD-10-CM

## 2017-11-22 DIAGNOSIS — Z79899 Other long term (current) drug therapy: Secondary | ICD-10-CM

## 2017-11-22 DIAGNOSIS — K7460 Unspecified cirrhosis of liver: Secondary | ICD-10-CM

## 2017-11-22 DIAGNOSIS — E119 Type 2 diabetes mellitus without complications: Secondary | ICD-10-CM

## 2017-11-22 LAB — GLUCOSE, CAPILLARY
Glucose-Capillary: 92 mg/dL (ref 65–99)
Glucose-Capillary: 86 mg/dL (ref 65–99)
Glucose-Capillary: 132 mg/dL — ABNORMAL HIGH (ref 65–99)
Glucose-Capillary: 153 mg/dL — ABNORMAL HIGH (ref 65–99)

## 2017-11-22 LAB — COMPREHENSIVE METABOLIC PANEL
ALT: 20 U/L (ref 14–54)
AST: 44 U/L — AB (ref 15–41)
Albumin: 2.5 g/dL — ABNORMAL LOW (ref 3.5–5.0)
Alkaline Phosphatase: 89 U/L (ref 38–126)
Anion gap: 7 (ref 5–15)
BUN: 7 mg/dL (ref 6–20)
CHLORIDE: 113 mmol/L — AB (ref 101–111)
CO2: 19 mmol/L — AB (ref 22–32)
CREATININE: 0.42 mg/dL — AB (ref 0.44–1.00)
Calcium: 7.8 mg/dL — ABNORMAL LOW (ref 8.9–10.3)
GFR calc non Af Amer: >60 mL/min (ref >60)
Glucose, Bld: 94 mg/dL (ref 65–99)
Potassium: 3.2 mmol/L — ABNORMAL LOW (ref 3.5–5.1)
SODIUM: 139 mmol/L (ref 135–145)
Total Bilirubin: 1.9 mg/dL — ABNORMAL HIGH (ref 0.3–1.2)
Total Protein: 6.1 g/dL — ABNORMAL LOW (ref 6.5–8.1)

## 2017-11-22 LAB — TYPE AND SCREEN
ABO/RH(D): O POS
ANTIBODY SCREEN: NEGATIVE

## 2017-11-22 LAB — HEMOGLOBIN A1C
HEMOGLOBIN A1C: 6.1 % — AB (ref 4.8–5.6)
MEAN PLASMA GLUCOSE: 128.37 mg/dL

## 2017-11-22 LAB — CBC
HEMATOCRIT: 28.4 % — AB (ref 35.0–47.0)
HEMOGLOBIN: 9.5 g/dL — AB (ref 12.0–16.0)
MCH: 29.2 pg (ref 26.0–34.0)
MCHC: 33.5 g/dL (ref 32.0–36.0)
MCV: 87.1 fL (ref 80.0–100.0)
Platelets: 52 10*3/uL — ABNORMAL LOW (ref 150–440)
RBC: 3.26 MIL/uL — AB (ref 3.80–5.20)
RDW: 19 % — ABNORMAL HIGH (ref 11.5–14.5)
WBC: 2 10*3/uL — ABNORMAL LOW (ref 3.6–11.0)

## 2017-11-22 LAB — C DIFFICILE QUICK SCREEN W PCR REFLEX
C DIFFICILE (CDIFF) INTERP: NOT DETECTED
C DIFFICILE (CDIFF) TOXIN: NEGATIVE
C DIFFICLE (CDIFF) ANTIGEN: NEGATIVE

## 2017-11-22 LAB — HEMOGLOBIN
HEMOGLOBIN: 9.4 g/dL — AB (ref 12.0–16.0)
HEMOGLOBIN: 10.8 g/dL — AB (ref 12.0–16.0)
Hemoglobin: 10.3 g/dL — ABNORMAL LOW (ref 12.0–16.0)

## 2017-11-22 LAB — VITAMIN B12: VITAMIN B 12: 569 pg/mL (ref 180–914)

## 2017-11-22 LAB — PROTIME-INR
INR: 1.42
Prothrombin Time: 17.2 seconds — ABNORMAL HIGH (ref 11.4–15.2)

## 2017-11-22 MED ORDER — POTASSIUM CHLORIDE 20 MEQ PO PACK
40.0000 meq | PACK | Freq: Once | ORAL | Status: AC
  Administered 2017-11-22: 40 meq via ORAL
  Filled 2017-11-22: qty 2
  Filled 2017-11-22: qty 1

## 2017-11-22 MED ORDER — ENSURE ENLIVE PO LIQD: 237.0000 mL | Freq: Two times a day (BID) | ORAL | Status: DC

## 2017-11-22 NOTE — Consult Note (Signed)
[place]  [phone] [phone]  ID: Roberta Lane OB: 02-24-1956  MR#: [id number]  IRS#:[id number]  Patient Care Team: Maryland Pink, MD as PCP - General (Family Medicine)  CHIEF COMPLAINT: Pancytopenia.  INTERVAL HISTORY: Patient is a 62 year old female with a past medical history significant for treated hepatitis C and cirrhosis recently presented to the emergency room with worsening weakness and fatigue, diarrhea, and melena.  She also had some mild abdominal pain.  Patient also admits to some confusion and "stumbling about" while at home.  She  feels significantly improved since admission and nearly back to her baseline.  She has no neurologic complaints.  She denies any recent fevers or illnesses.  She has a good appetite and denies weight loss.  She denies any chest pain, cough, hemoptysis, or shortness of breath.  She denies any vomiting or constipation.  She has no urinary complaints.  Patient otherwise feels well and offers no further specific complaints today.  REVIEW OF SYSTEMS:   Review of Systems  Constitutional: Positive for malaise/fatigue. Negative for fever and weight loss.  Respiratory: Negative.  Negative for cough, hemoptysis and shortness of breath.   Cardiovascular: Negative.  Negative for chest pain and leg swelling.  Gastrointestinal: Positive for abdominal pain, melena and nausea.  Genitourinary: Negative.  Negative for hematuria.  Musculoskeletal: Negative.   Skin: Negative.  Negative for rash.  Neurological: Positive for dizziness and weakness. Negative for sensory change, focal weakness and headaches.  Endo/Heme/Allergies: Does not bruise/bleed easily.  Psychiatric/Behavioral: Negative.  The patient is not nervous/anxious.     As per HPI. Otherwise, a complete review of systems is negative.  PAST MEDICAL HISTORY: Past Medical History:  Diagnosis Date  . Diabetes mellitus without complication ([place])   .  Hepatitis C   . Hypertension     PAST SURGICAL HISTORY: History reviewed. No pertinent surgical history.  FAMILY HISTORY: History reviewed. No pertinent family history.  ADVANCED DIRECTIVES (Y/N):  '@ADVDIR' @  HEALTH MAINTENANCE: Social History   Tobacco Use  . Smoking status: Never Smoker  . Smokeless tobacco: Never Used  Substance Use Topics  . Alcohol use: Yes  . Drug use: No     Colonoscopy:  PAP:  Bone density:  Lipid panel:  Allergies  Allergen Reactions  . Penicillins Anaphylaxis  . Codeine Itching  . Tylenol [Acetaminophen] Other (See Comments)    Breast pain    Current Facility-Administered Medications  Medication Dose Route Frequency Provider Last Rate Last Dose  . 0.9 %  sodium chloride infusion   Intravenous Continuous Bettey Costa, MD 75 mL/hr at 11/22/17 1726    . acetaminophen (TYLENOL) tablet 650 mg  650 mg Oral Q6H PRN Bettey Costa, MD       Or  . acetaminophen (TYLENOL) suppository 650 mg  650 mg Rectal Q6H PRN Mody, Sital, MD      . amLODipine (NORVASC) tablet 5 mg  5 mg Oral Daily Mody, Sital, MD   5 mg at 11/22/17 1019  . bisacodyl (DULCOLAX) suppository 10 mg  10 mg Rectal Daily PRN Bettey Costa, MD      . Derrill Memo ON 11/23/2017] feeding supplement (ENSURE ENLIVE) (ENSURE ENLIVE) liquid 237 mL  237 mL Oral BID BM Gouru, Aruna, MD      . hydrALAZINE (APRESOLINE) injection 10 mg  10 mg Intravenous Q6H PRN Mody, Sital, MD      . insulin aspart (novoLOG) injection 0-9 Units  0-9 Units Subcutaneous TID WC Bettey Costa, MD  1 Units at 11/22/17 1811  . ondansetron (ZOFRAN) tablet 4 mg  4 mg Oral Q6H PRN Bettey Costa, MD       Or  . ondansetron (ZOFRAN) injection 4 mg  4 mg Intravenous Q6H PRN Mody, Sital, MD      . pantoprazole (PROTONIX) 80 mg in sodium chloride 0.9 % 250 mL (0.32 mg/mL) infusion  8 mg/hr Intravenous Continuous Alfred Levins, [place], MD 25 mL/hr at 11/22/17 1236 8 mg/hr at 11/22/17 1236  . [START ON 11/25/2017] pantoprazole (PROTONIX) injection 40  mg  40 mg Intravenous Q12H Alfred Levins, [place], MD      . senna-docusate (Senokot-S) tablet 1 tablet  1 tablet Oral QHS PRN Bettey Costa, MD      . traMADol (ULTRAM) tablet 50 mg  50 mg Oral Q6H PRN Bettey Costa, MD   50 mg at 11/22/17 1245    OBJECTIVE: Vitals:   11/22/17 1345 11/22/17 1927  BP: 137/67 132/63  Pulse: 86 87  Resp: 20 13  Temp: 98.3 F (36.8 C) 98.2 F (36.8 C)  SpO2: 98% 97%     Body mass index is 28.89 kg/m.    ECOG FS:1 - Symptomatic but completely ambulatory  General: Well-developed, well-nourished, no acute distress. Eyes: Pink conjunctiva, anicteric sclera. HEENT: Normocephalic, moist mucous membranes, clear oropharnyx. Lungs: Clear to auscultation bilaterally. Heart: Regular rate and rhythm. No rubs, murmurs, or gallops. Abdomen: Soft, nontender, nondistended. No organomegaly noted, normoactive bowel sounds. Musculoskeletal: No edema, cyanosis, or clubbing. Neuro: Alert, answering all questions appropriately. Cranial nerves grossly intact. Skin: No rashes or petechiae noted. Psych: Normal affect. Lymphatics: No cervical, calvicular, axillary or inguinal LAD.   LAB RESULTS:  Lab Results  Component Value Date   NA 139 11/22/2017   K 3.2 (L) 11/22/2017   CL 113 (H) 11/22/2017   CO2 19 (L) 11/22/2017   GLUCOSE 94 11/22/2017   BUN 7 11/22/2017   CREATININE 0.42 (L) 11/22/2017   CALCIUM 7.8 (L) 11/22/2017   PROT 6.1 (L) 11/22/2017   ALBUMIN 2.5 (L) 11/22/2017   AST 44 (H) 11/22/2017   ALT 20 11/22/2017   ALKPHOS 89 11/22/2017   BILITOT 1.9 (H) 11/22/2017   GFRNONAA >60 11/22/2017   GFRAA >60 11/22/2017    Lab Results  Component Value Date   WBC 2.0 (L) 11/22/2017   NEUTROABS 1.5 11/21/2017   HGB 10.8 (L) 11/22/2017   HCT 28.4 (L) 11/22/2017   MCV 87.1 11/22/2017   PLT 52 (L) 11/22/2017     STUDIES: Ct Abdomen Pelvis W Contrast  Result Date: 11/21/2017 CLINICAL DATA:  62 year old with current history of hepatitis-C, hypertension and  diabetes, presenting with acute onset of diarrhea today. EXAM: CT ABDOMEN AND PELVIS WITH CONTRAST TECHNIQUE: Multidetector CT imaging of the abdomen and pelvis was performed using the standard protocol following bolus administration of intravenous contrast. CONTRAST:  139m ISOVUE-300 IOPAMIDOL INJECTION 61% IV. Oral contrast was not administered. COMPARISON:  None. FINDINGS: Lower chest: Heart size normal. Visualized lung bases clear. Varices involving the distal esophagus. Hepatobiliary: Irregularity of the hepatic contour and marked enlargement of the caudate lobe. Multiple low-attenuation masses throughout both lobes of the liver, the largest measuring approximately 1.5 cm in the posterior segment right lobe. The largest masses represent cysts with Hounsfield units 0-10, making it likely that the smaller masses are cysts as well. Mild diffuse hepatic steatosis. Surgically absent gallbladder. No unexpected biliary ductal dilation. Pancreas: Normal in appearance without evidence of mass, ductal dilation, or inflammation. Spleen: Splenomegaly, with the  spleen measuring approximately 13.7 x 6.4 x 17.3 cm, yielding a volume of approximately 758 mL. No focal splenic parenchymal abnormality. Adrenals/Urinary Tract: Normal appearing adrenal glands. Mild right hydronephrosis without evidence of right ureteral dilation. Mild left hydronephrosis without evidence of left ureteral dilation. No urinary tract calculi on either side. Benign cyst involving the lower pole of the left kidney measuring approximately 1.1 x 1.9 cm. No solid mass involving either kidney. Normal appearing decompressed urinary bladder. Stomach/Bowel: Stomach decompressed and unremarkable. Normal-appearing small bowel. Expected colonic stool burden. Liquid stool in the ascending colon. No focal abnormality involving the colon. Normal appendix in the right upper pelvis. Vascular/Lymphatic: No visible aortoiliofemoral atherosclerosis. Normal-appearing  portal venous and systemic venous systems. Gastric varices along the lesser curvature. Recanalization of the umbilical vein. No pathologic lymphadenopathy. Reproductive: Uterus not visualized and presumed surgically absent. No adnexal masses. Other: Periumbilical hernia containing the gas-filled transverse colon without evidence of incarceration. Adjacent umbilical hernia containing fat. Edema at the root of the mesentery.  No ascites. Musculoskeletal: Degenerative disc disease and spondylosis at T11-12. Facet degenerative changes on the right at L3-4 and L4-5. Severe multifactorial spinal stenosis at L4-5. IMPRESSION: 1. Hepatic cirrhosis. Multiple hepatic masses, the largest of which are cysts, making it likely that the smaller masses are also cysts. 2. Patent portal vein and patent splenic vein. 3. Portal venous hypertension with gastric varices, esophageal varices and marked splenomegaly. 4. Mild bilateral hydronephrosis, right greater than left, without associated ureteral dilation. 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 No evidence of urinary tract calculi. 5. Periumbilical hernia containing a portion of the gas-filled transverse colon without evidence of incarceration. 6. Nonspecific edema at the root of the mesentery. This is likely related to the portal hypertension. 7. Multifactorial spinal stenosis at L4-5. Electronically Signed   By: Evangeline Dakin [name]   On: 11/21/2017 16:31    ASSESSMENT: Pancytopenia.  PLAN:  1.  Pancytopenia: Multifactorial including cirrhosis, splenomegaly of greater than 17 cm causing sequestration, and recent GI bleed.  Patient's hemoglobin is decreased, but stable.  She has an EGD scheduled for tomorrow.  Her iron stores are within normal limits, but this may be falsely elevated given her recent acute bleed.  B12 and folate are within normal limits.  Will order hemolysis labs for completeness.  Her thrombocytopenia is likely secondary to her splenomegaly.   Will order antiplatelet antibodies for completeness.  Her white blood cell count decreased to 2.0, but patient is asymptomatic.  Monitor.  She does not require bone marrow biopsy at this time.  Please ensure patient has follow-up in the cancer center 2-3 weeks after discharge for repeat laboratory work and further evaluation. 2.  Cirrhosis: Will order AFP for completeness.  Continue follow-up with GI as indicated. 3.  GI bleed: EGD scheduled for tomorrow.  Continue IV Protonix. 4.  Hepatic masses: Most likely benign cysts, AFP as above.  Appreciate consult, call with questions.    Lloyd Huger, MD   11/22/2017 10:51 PM

## 2017-11-22 NOTE — H&P (View-Only) (Signed)
[facility] GI Inpatient Consult Note   Kathline Magic, [name]  Reason for Consult: Melena, anemia, abdominal pain.   Attending Requesting Consult: Bettey Costa MD  Outpatient Primary Physician: Dr. Maryland Pink  History of Present Illness: Roberta Lane is a 62 y.o. female recently relocated to [facility] from [facility].  Patient was born in [place].  She is retired.  She presented to the emergency room yesterday afternoon with complaints of progressive weakness, did be progressive diarrhea and "black stools".  She was found to be Hemoccult positive in the emergency room and was admitted for diffuse abdominal pain along with a mild anemia with hemoglobin around 10.5.  Patient has a personal history of hepatitis C diagnosed in early 2017 in her previous state of [place].  She was treated reportedly with a 12-week course of Harvoni and had successful sustained remission of virus translating to cure of the hepatitis C. Unfortunately the patient underwent a CT yesterday showing evidence of progressive cirrhosis along with endorgan findings of gastric and esophageal varices.  Patient denies any history of confusion, jaundice, increased abdominal girth or hematemesis.  She denies any history of alcohol abuse or regular intake. The patient denies any NSAID use. Patient reports previous history of colonoscopy for screening around 3 years ago in [facility].  Patient reports the results as "normal".  Patient has not previously undergone upper endoscopy.  Past Medical History:  Past Medical History:  Diagnosis Date  . Diabetes mellitus without complication ([place])   . Hepatitis C   . Hypertension     Problem List: Patient Active Problem List   Diagnosis Date Noted  . GIB (gastrointestinal bleeding) 11/21/2017    Past Surgical History: History reviewed. No pertinent surgical history.  Allergies: Allergies  Allergen Reactions  . Penicillins Anaphylaxis  .  Codeine Itching  . Tylenol [Acetaminophen] Other (See Comments)    Breast pain    Home Medications: Medications Prior to Admission  Medication Sig Dispense Refill Last Dose  . enalapril (VASOTEC) 2.5 MG tablet Take 2.5 mg by mouth daily.   Past Month at Unknown time  . metFORMIN (GLUCOPHAGE) 500 MG tablet Take 1,000 mg by mouth 2 (two) times daily with a meal.    11/20/2017 at Unknown time   Home medication reconciliation was completed with the patient.   Scheduled Inpatient Medications:   . amLODipine  5 mg Oral Daily  . insulin aspart  0-9 Units Subcutaneous TID WC  . [START ON 11/25/2017] pantoprazole  40 mg Intravenous Q12H    Continuous Inpatient Infusions:   . sodium chloride 75 mL/hr at 11/22/17 1017  . pantoprozole (PROTONIX) infusion 8 mg/hr (11/22/17 0323)    PRN Inpatient Medications:  acetaminophen **OR** acetaminophen, bisacodyl, hydrALAZINE, ondansetron **OR** ondansetron (ZOFRAN) IV, senna-docusate, traMADol  Family History: family history is not on file.   GI Family History: Negative  Social History:   reports that  has never smoked. she has never used smokeless tobacco. She reports that she drinks alcohol. She reports that she does not use drugs. The patient denies ETOH, tobacco, or drug use.    Review of Systems: Review of Systems - History obtained from the patient General ROS: positive for  - fatigue and night sweats Psychological ROS: negative Allergy and Immunology ROS: positive for - Patient reports recent allergic reaction to "blood pressure medicine". Endocrine ROS: negative Respiratory ROS: no cough, shortness of breath, or wheezing Cardiovascular ROS: no chest pain or dyspnea on exertion Gastrointestinal ROS:  As in the history of present illness Genito-Urinary ROS: negative Musculoskeletal ROS: negative Neurological ROS: no TIA or stroke symptoms Dermatological ROS: positive for eczema and rash  Physical Examination: BP 133/63 (BP Location:  Right Arm)   Pulse 85   Temp 98 F (36.7 C) (Oral)   Resp 12   Ht 5\' 4"  (1.626 m)   Wt 76.3 kg (168 lb 4.8 oz)   SpO2 96%   BMI 28.89 kg/m  Physical Exam  Data: Lab Results  Component Value Date   WBC 2.0 (L) 11/22/2017   HGB 9.4 (L) 11/22/2017   HCT 28.4 (L) 11/22/2017   MCV 87.1 11/22/2017   PLT 52 (L) 11/22/2017   Recent Labs  Lab 11/21/17 2141 11/22/17 0235 11/22/17 0827  HGB 9.8* 9.5* 9.4*   Lab Results  Component Value Date   NA 139 11/22/2017   K 3.2 (L) 11/22/2017   CL 113 (H) 11/22/2017   CO2 19 (L) 11/22/2017   BUN 7 11/22/2017   CREATININE 0.42 (L) 11/22/2017   Lab Results  Component Value Date   ALT 20 11/22/2017   AST 44 (H) 11/22/2017   ALKPHOS 89 11/22/2017   BILITOT 1.9 (H) 11/22/2017   No results for input(s): APTT, INR, PTT in the last 168 hours. CBC Latest Ref Rng & Units 11/22/2017 11/22/2017 11/21/2017  WBC 3.6 - 11.0 K/uL - 2.0(L) -  Hemoglobin 12.0 - 16.0 g/dL 9.4(L) 9.5(L) 9.8(L)  Hematocrit 35.0 - 47.0 % - 28.4(L) -  Platelets 150 - 440 K/uL - 52(L) -    STUDIES: Ct Abdomen Pelvis W Contrast  Result Date: 11/21/2017 CLINICAL DATA:  61 year old with current history of hepatitis-C, hypertension and diabetes, presenting with acute onset of diarrhea today. EXAM: CT ABDOMEN AND PELVIS WITH CONTRAST TECHNIQUE: Multidetector CT imaging of the abdomen and pelvis was performed using the standard protocol following bolus administration of intravenous contrast. CONTRAST:  125mL ISOVUE-300 IOPAMIDOL INJECTION 61% IV. Oral contrast was not administered. COMPARISON:  None. FINDINGS: Lower chest: Heart size normal. Visualized lung bases clear. Varices involving the distal esophagus. Hepatobiliary: Irregularity of the hepatic contour and marked enlargement of the caudate lobe. Multiple low-attenuation masses throughout both lobes of the liver, the largest measuring approximately 1.5 cm in the posterior segment right lobe. The largest masses represent cysts  with Hounsfield units 0-10, making it likely that the smaller masses are cysts as well. Mild diffuse hepatic steatosis. Surgically absent gallbladder. No unexpected biliary ductal dilation. Pancreas: Normal in appearance without evidence of mass, ductal dilation, or inflammation. Spleen: Splenomegaly, with the spleen measuring approximately 13.7 x 6.4 x 17.3 cm, yielding a volume of approximately 758 mL. No focal splenic parenchymal abnormality. Adrenals/Urinary Tract: Normal appearing adrenal glands. Mild right hydronephrosis without evidence of right ureteral dilation. Mild left hydronephrosis without evidence of left ureteral dilation. No urinary tract calculi on either side. Benign cyst involving the lower pole of the left kidney measuring approximately 1.1 x 1.9 cm. No solid mass involving either kidney. Normal appearing decompressed urinary bladder. Stomach/Bowel: Stomach decompressed and unremarkable. Normal-appearing small bowel. Expected colonic stool burden. Liquid stool in the ascending colon. No focal abnormality involving the colon. Normal appendix in the right upper pelvis. Vascular/Lymphatic: No visible aortoiliofemoral atherosclerosis. Normal-appearing portal venous and systemic venous systems. Gastric varices along the lesser curvature. Recanalization of the umbilical vein. No pathologic lymphadenopathy. Reproductive: Uterus not visualized and presumed surgically absent. No adnexal masses. Other: Periumbilical hernia containing the gas-filled transverse colon without evidence of incarceration. Adjacent  umbilical hernia containing fat. Edema at the root of the mesentery.  No ascites. Musculoskeletal: Degenerative disc disease and spondylosis at T11-12. Facet degenerative changes on the right at L3-4 and L4-5. Severe multifactorial spinal stenosis at L4-5. IMPRESSION: 1. Hepatic cirrhosis. Multiple hepatic masses, the largest of which are cysts, making it likely that the smaller masses are also cysts.  2. Patent portal vein and patent splenic vein. 3. Portal venous hypertension with gastric varices, esophageal varices and marked splenomegaly. 4. Mild bilateral hydronephrosis, right greater than left, without associated ureteral dilation. 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 No evidence of urinary tract calculi. 5. Periumbilical hernia containing a portion of the gas-filled transverse colon without evidence of incarceration. 6. Nonspecific edema at the root of the mesentery. This is likely related to the portal hypertension. 7. Multifactorial spinal stenosis at L4-5. Electronically Signed   By: Evangeline Dakin [name]   On: 11/21/2017 16:31   @IMAGES @  Assessment:  1.  Melena-differential diagnosis includes portal hypertensive gastropathy, oozing from gastroesophageal varices, peptic ulcer disease, small bowel bleed or right colonic bleed.  It is encouraging to note the patient has had a colonoscopy within the past 5 years that was negative.  2.  Cirrhosis, child Pugh class as yet undetermined given lack of prothrombin time in admission.  We will need to order prothrombin time to get child Pugh score as well as calculate MELD.  Cirrhosis presumably secondary to #3 below.  3.  Gastroesophageal varices per CT scan.  4.  Moderate to heavy stool burden on CT scan.  5.  The symptomatic anemia-hemoglobin is decreased to 9.4 today.  Likely secondary to gastrointestinal blood loss.  Recommendations: 1.  Obtain prothrombin time with INR.  2.  Plan EGD. The patient understands the nature of the planned procedure, indications, risks, alternatives and potential complications including but not limited to bleeding, infection, perforation, damage to internal organs and possible oversedation/side effects from anesthesia. The patient agrees and gives consent to proceed.  Please refer to procedure notes for findings, recommendations and patient disposition/instructions.  3. Continue IV protonix as  ordered.   4. Will follow along.   Thank you for the consult. Please call with questions or concerns.  Olean Ree, MD  11/22/2017 11:36 AM

## 2017-11-22 NOTE — Progress Notes (Signed)
Pt sitting up in the chair, call bell in reach.  Will continue to monitor the pt throughout the shift.Pt alert and oriented x4, no complaints of pain or discomfort.  Pain noted  0 /10.

## 2017-11-22 NOTE — Progress Notes (Signed)
Patient did not request information on AD.

## 2017-11-22 NOTE — Progress Notes (Signed)
Initial Nutrition Assessment  DOCUMENTATION CODES:   Not applicable  INTERVENTION:  Provide Ensure Enlive po BID, each supplement provides 350 kcal and 20 grams of protein.  Encouraged adequate intake of calories and protein from meals.  NUTRITION DIAGNOSIS:   Inadequate oral intake related to poor appetite as evidenced by per patient/family report.  GOAL:   Patient will meet greater than or equal to 90% of their needs  MONITOR:   PO intake, Supplement acceptance, Diet advancement, Labs, Weight trends, I & O's  REASON FOR ASSESSMENT:   Malnutrition Screening Tool    ASSESSMENT:   62 year old female with PMHx of HTN, DM type 2, hepatitis C who presented with diarrhea, dark-colored stools, generalized weakness. Found to have evidence of progressive cirrhosis and gastric and esophageal varices on CT.   -Plan is for EGD 1/31.  Met with patient at bedside. She reports she has not been eating well for the past 1-2 months. She initially thought she had the flu. She has been having nausea after smelling food. She also reports anorexia. She has only been eating 1-2 small meals per day. She may have 1/2 of a sandwich or a small amount of meat and vegetables. She reports she does not typically look at her bowel movements, so she did not realize they were dark black until right before admission. She has been feeling very weak and dehydrated for a while now. Patient enjoys milk and was drinking 1-2 glasses of milk daily. Also endorses her stomach has been somewhat distended, though she did not realize it until she saw a picture of herself.  UBW 170 lbs. Patient reports she has lost about 5 lbs over the past month. Current body weight likely falsely elevated with fluid - will continue to monitor.  Medications reviewed and include: Novolog 0-9 units TID, pantoprazole, NS at 75 mL/hr.  Labs reviewed: CBG 86-180, Potassium 3.2, Chloride 113, CO2 19, Creatinine 0.42. HgbA1c 6.1 1/29.  Patient  does not meet criteria for malnutrition at this time, but is at risk for acute malnutrition.  NUTRITION - FOCUSED PHYSICAL EXAM:    Most Recent Value  Orbital Region  No depletion  Upper Arm Region  Mild depletion  Thoracic and Lumbar Region  No depletion  Buccal Region  No depletion  Temple Region  No depletion  Clavicle Bone Region  No depletion  Clavicle and Acromion Bone Region  No depletion  Scapular Bone Region  No depletion  Dorsal Hand  No depletion  Patellar Region  No depletion  Anterior Thigh Region  No depletion  Posterior Calf Region  No depletion  Edema (RD Assessment)  Mild  Hair  Reviewed  Eyes  Reviewed  Mouth  Reviewed  Skin  Reviewed  Nails  Reviewed     Diet Order:  Diet full liquid Room service appropriate? Yes; Fluid consistency: Thin Diet NPO time specified  EDUCATION NEEDS:   No education needs have been identified at this time  Skin:  Skin Assessment: Reviewed RN Assessment  Last BM:  11/21/2017  Height:   Ht Readings from Last 1 Encounters:  11/21/17 '5\' 4"'  (1.626 m)    Weight:   Wt Readings from Last 1 Encounters:  11/21/17 168 lb 4.8 oz (76.3 kg)    Ideal Body Weight:  54.5 kg  BMI:  Body mass index is 28.89 kg/m.  Estimated Nutritional Needs:   Kcal:  1575-1840 (MSJ x 1.2-1.4)  Protein:  75-90 grams (1-1.2 grams/kg)  Fluid:  1.6-1.9 L/day (  30-35 mL/kg IBW)  Willey Blade, MS, RD, LDN Office: [phone] Pager: [phone] After Hours/Weekend Pager: [phone]

## 2017-11-22 NOTE — Plan of Care (Signed)
Oriented to unit.  VSS, free of falls.  Ambulated to BR, sb assist.  Denies pain, nausea.  No BM since arriving to unit.  Bed in low position, call bell within reach.  WCTM.

## 2017-11-22 NOTE — Progress Notes (Signed)
[place] [facility] [place] NAME: Roberta Lane    MR#:  [id number]  DATE OF BIRTH:  Apr 19, 1956  SUBJECTIVE:  CHIEF COMPLAINT:  Patient is resting comfortably.  Denies any other episodes of melena as she did not have a bowel movement since admission.  Denies any abdominal pain.  REVIEW OF SYSTEMS:  CONSTITUTIONAL: No fever, fatigue or weakness.  EYES: No blurred or double vision.  EARS, NOSE, AND THROAT: No tinnitus or ear pain.  RESPIRATORY: No cough, shortness of breath, wheezing or hemoptysis.  CARDIOVASCULAR: No chest pain, orthopnea, edema.  GASTROINTESTINAL: No nausea, vomiting, diarrhea or abdominal pain.  GENITOURINARY: No dysuria, hematuria.  ENDOCRINE: No polyuria, nocturia,  HEMATOLOGY: No anemia, easy bruising or bleeding SKIN: No rash or lesion. MUSCULOSKELETAL: No joint pain or arthritis.   NEUROLOGIC: No tingling, numbness, weakness.  PSYCHIATRY: No anxiety or depression.   DRUG ALLERGIES:   Allergies  Allergen Reactions  . Penicillins Anaphylaxis  . Codeine Itching  . Tylenol [Acetaminophen] Other (See Comments)    Breast pain    VITALS:  Blood pressure 137/67, pulse 86, temperature 98.3 F (36.8 C), temperature source Oral, resp. rate 20, height 5\' 4"  (1.626 m), weight 76.3 kg (168 lb 4.8 oz), SpO2 98 %.  PHYSICAL EXAMINATION:  GENERAL:  62 y.o.-year-old patient lying in the bed with no acute distress.  EYES: Pupils equal, round, reactive to light and accommodation. No scleral icterus. Extraocular muscles intact.  HEENT: Head atraumatic, normocephalic. Oropharynx and nasopharynx clear.  NECK:  Supple, no jugular venous distention. No thyroid enlargement, no tenderness.  LUNGS: Normal breath sounds bilaterally, no wheezing, rales,rhonchi or crepitation. No use of accessory muscles of respiration.  CARDIOVASCULAR: S1, S2 normal. No murmurs, rubs, or gallops.  ABDOMEN: Soft, nontender, nondistended. Bowel  sounds present. No organomegaly or mass.  EXTREMITIES: No pedal edema, cyanosis, or clubbing.  NEUROLOGIC: Cranial nerves II through XII are intact. Muscle strength 5/5 in all extremities. Sensation intact. Gait not checked.  PSYCHIATRIC: The patient is alert and oriented x 3.  SKIN: No obvious rash, lesion, or ulcer.    LABORATORY PANEL:   CBC Recent Labs  Lab 11/22/17 0235  11/22/17 1407  WBC 2.0*  --   --   HGB 9.5*   < > 10.8*  HCT 28.4*  --   --   PLT 52*  --   --    < > = values in this interval not displayed.   ------------------------------------------------------------------------------------------------------------------  Chemistries  Recent Labs  Lab 11/22/17 0235  NA 139  K 3.2*  CL 113*  CO2 19*  GLUCOSE 94  BUN 7  CREATININE 0.42*  CALCIUM 7.8*  AST 44*  ALT 20  ALKPHOS 89  BILITOT 1.9*   ------------------------------------------------------------------------------------------------------------------  Cardiac Enzymes No results for input(s): TROPONINI in the last 168 hours. ------------------------------------------------------------------------------------------------------------------  RADIOLOGY:  Ct Abdomen Pelvis W Contrast  Result Date: 11/21/2017 CLINICAL DATA:  62 year old with current history of hepatitis-C, hypertension and diabetes, presenting with acute onset of diarrhea today. EXAM: CT ABDOMEN AND PELVIS WITH CONTRAST TECHNIQUE: Multidetector CT imaging of the abdomen and pelvis was performed using the standard protocol following bolus administration of intravenous contrast. CONTRAST:  160mL ISOVUE-300 IOPAMIDOL INJECTION 61% IV. Oral contrast was not administered. COMPARISON:  None. FINDINGS: Lower chest: Heart size normal. Visualized lung bases clear. Varices involving the distal esophagus. Hepatobiliary: Irregularity of the hepatic contour and marked enlargement of the caudate lobe. Multiple low-attenuation masses throughout both lobes  of  the liver, the largest measuring approximately 1.5 cm in the posterior segment right lobe. The largest masses represent cysts with Hounsfield units 0-10, making it likely that the smaller masses are cysts as well. Mild diffuse hepatic steatosis. Surgically absent gallbladder. No unexpected biliary ductal dilation. Pancreas: Normal in appearance without evidence of mass, ductal dilation, or inflammation. Spleen: Splenomegaly, with the spleen measuring approximately 13.7 x 6.4 x 17.3 cm, yielding a volume of approximately 758 mL. No focal splenic parenchymal abnormality. Adrenals/Urinary Tract: Normal appearing adrenal glands. Mild right hydronephrosis without evidence of right ureteral dilation. Mild left hydronephrosis without evidence of left ureteral dilation. No urinary tract calculi on either side. Benign cyst involving the lower pole of the left kidney measuring approximately 1.1 x 1.9 cm. No solid mass involving either kidney. Normal appearing decompressed urinary bladder. Stomach/Bowel: Stomach decompressed and unremarkable. Normal-appearing small bowel. Expected colonic stool burden. Liquid stool in the ascending colon. No focal abnormality involving the colon. Normal appendix in the right upper pelvis. Vascular/Lymphatic: No visible aortoiliofemoral atherosclerosis. Normal-appearing portal venous and systemic venous systems. Gastric varices along the lesser curvature. Recanalization of the umbilical vein. No pathologic lymphadenopathy. Reproductive: Uterus not visualized and presumed surgically absent. No adnexal masses. Other: Periumbilical hernia containing the gas-filled transverse colon without evidence of incarceration. Adjacent umbilical hernia containing fat. Edema at the root of the mesentery.  No ascites. Musculoskeletal: Degenerative disc disease and spondylosis at T11-12. Facet degenerative changes on the right at L3-4 and L4-5. Severe multifactorial spinal stenosis at L4-5. IMPRESSION: 1.  Hepatic cirrhosis. Multiple hepatic masses, the largest of which are cysts, making it likely that the smaller masses are also cysts. 2. Patent portal vein and patent splenic vein. 3. Portal venous hypertension with gastric varices, esophageal varices and marked splenomegaly. 4. Mild bilateral hydronephrosis, right greater than left, without associated ureteral dilation. 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 No evidence of urinary tract calculi. 5. Periumbilical hernia containing a portion of the gas-filled transverse colon without evidence of incarceration. 6. Nonspecific edema at the root of the mesentery. This is likely related to the portal hypertension. 7. Multifactorial spinal stenosis at L4-5. Electronically Signed   By: Evangeline Dakin [name]   On: 11/21/2017 16:31    EKG:   Orders placed or performed during the hospital encounter of 11/21/17  . EKG 12-Lead  . EKG 12-Lead    ASSESSMENT AND PLAN:     62 year old female with history of diabetes, essential hypertension and hepatitis C which was fully treated in 2017 who presents to generalized weakness and diarrhea.   1. Melena: Differential diagnosis includes portal hypertensive gastropathy ,oozing from gastroesophageal varices, peptic ulcer disease, small bowel bleed or right colonic bleed Continue PPI Hemoglobin 9.4-10.8 Monitor hemoglobin every 6 hours GI Dr. Alice Reichert has seen the patient and scheduled for EGD tomorrow  2. Pancytopenia of unclear etiology Oncology evaluation requested Anemia panel ordered along with TSH and B12 Follow up on CT abdomen to evaluate underlying mass  3. Essential hypertension: Patient had a rash with enalapril. She saw dermatologist and had a punch biopsy. I will start Norvasc 5 mg daily. Continue to monitor blood pressure.  4. Diabetes: Start sliding scale Hold metformin       All the records are reviewed and case discussed with Care Management/Social Workerr. Management  plans discussed with the patient, family and they are in agreement.  CODE STATUS: fc   TOTAL TIME TAKING CARE OF THIS PATIENT: 36  minutes.   POSSIBLE  D/C IN 1-2  DAYS, DEPENDING ON CLINICAL CONDITION.  Note: This dictation was prepared with Dragon dictation along with smaller phrase technology. Any transcriptional errors that result from this process are unintentional.   Nicholes Mango M.D on 11/22/2017 at 6:46 PM  Between 7am to 6pm - Pager - [phone] After 6pm go to [email or web address] - password [email or web address] [facility]  [facility]  Office  [phone]  CC: Primary care physician; Maryland Pink, MD

## 2017-11-22 NOTE — Consult Note (Signed)
[facility] GI Inpatient Consult Note   Kathline Magic, [name]  Reason for Consult: Melena, anemia, abdominal pain.   Attending Requesting Consult: Bettey Costa MD  Outpatient Primary Physician: Dr. Maryland Pink  History of Present Illness: Roberta Lane is a 62 y.o. female recently relocated to [facility] from [facility].  Patient was born in [place].  She is retired.  She presented to the emergency room yesterday afternoon with complaints of progressive weakness, did be progressive diarrhea and "black stools".  She was found to be Hemoccult positive in the emergency room and was admitted for diffuse abdominal pain along with a mild anemia with hemoglobin around 10.5.  Patient has a personal history of hepatitis C diagnosed in early 2017 in her previous state of [place].  She was treated reportedly with a 12-week course of Harvoni and had successful sustained remission of virus translating to cure of the hepatitis C. Unfortunately the patient underwent a CT yesterday showing evidence of progressive cirrhosis along with endorgan findings of gastric and esophageal varices.  Patient denies any history of confusion, jaundice, increased abdominal girth or hematemesis.  She denies any history of alcohol abuse or regular intake. The patient denies any NSAID use. Patient reports previous history of colonoscopy for screening around 3 years ago in [facility].  Patient reports the results as "normal".  Patient has not previously undergone upper endoscopy.  Past Medical History:  Past Medical History:  Diagnosis Date  . Diabetes mellitus without complication ([place])   . Hepatitis C   . Hypertension     Problem List: Patient Active Problem List   Diagnosis Date Noted  . GIB (gastrointestinal bleeding) 11/21/2017    Past Surgical History: History reviewed. No pertinent surgical history.  Allergies: Allergies  Allergen Reactions  . Penicillins Anaphylaxis  .  Codeine Itching  . Tylenol [Acetaminophen] Other (See Comments)    Breast pain    Home Medications: Medications Prior to Admission  Medication Sig Dispense Refill Last Dose  . enalapril (VASOTEC) 2.5 MG tablet Take 2.5 mg by mouth daily.   Past Month at Unknown time  . metFORMIN (GLUCOPHAGE) 500 MG tablet Take 1,000 mg by mouth 2 (two) times daily with a meal.    11/20/2017 at Unknown time   Home medication reconciliation was completed with the patient.   Scheduled Inpatient Medications:   . amLODipine  5 mg Oral Daily  . insulin aspart  0-9 Units Subcutaneous TID WC  . [START ON 11/25/2017] pantoprazole  40 mg Intravenous Q12H    Continuous Inpatient Infusions:   . sodium chloride 75 mL/hr at 11/22/17 1017  . pantoprozole (PROTONIX) infusion 8 mg/hr (11/22/17 0323)    PRN Inpatient Medications:  acetaminophen **OR** acetaminophen, bisacodyl, hydrALAZINE, ondansetron **OR** ondansetron (ZOFRAN) IV, senna-docusate, traMADol  Family History: family history is not on file.   GI Family History: Negative  Social History:   reports that  has never smoked. she has never used smokeless tobacco. She reports that she drinks alcohol. She reports that she does not use drugs. The patient denies ETOH, tobacco, or drug use.    Review of Systems: Review of Systems - History obtained from the patient General ROS: positive for  - fatigue and night sweats Psychological ROS: negative Allergy and Immunology ROS: positive for - Patient reports recent allergic reaction to "blood pressure medicine". Endocrine ROS: negative Respiratory ROS: no cough, shortness of breath, or wheezing Cardiovascular ROS: no chest pain or dyspnea on exertion Gastrointestinal ROS:  As in the history of present illness Genito-Urinary ROS: negative Musculoskeletal ROS: negative Neurological ROS: no TIA or stroke symptoms Dermatological ROS: positive for eczema and rash  Physical Examination: BP 133/63 (BP Location:  Right Arm)   Pulse 85   Temp 98 F (36.7 C) (Oral)   Resp 12   Ht 5\' 4"  (1.626 m)   Wt 76.3 kg (168 lb 4.8 oz)   SpO2 96%   BMI 28.89 kg/m  Physical Exam  Data: Lab Results  Component Value Date   WBC 2.0 (L) 11/22/2017   HGB 9.4 (L) 11/22/2017   HCT 28.4 (L) 11/22/2017   MCV 87.1 11/22/2017   PLT 52 (L) 11/22/2017   Recent Labs  Lab 11/21/17 2141 11/22/17 0235 11/22/17 0827  HGB 9.8* 9.5* 9.4*   Lab Results  Component Value Date   NA 139 11/22/2017   K 3.2 (L) 11/22/2017   CL 113 (H) 11/22/2017   CO2 19 (L) 11/22/2017   BUN 7 11/22/2017   CREATININE 0.42 (L) 11/22/2017   Lab Results  Component Value Date   ALT 20 11/22/2017   AST 44 (H) 11/22/2017   ALKPHOS 89 11/22/2017   BILITOT 1.9 (H) 11/22/2017   No results for input(s): APTT, INR, PTT in the last 168 hours. CBC Latest Ref Rng & Units 11/22/2017 11/22/2017 11/21/2017  WBC 3.6 - 11.0 K/uL - 2.0(L) -  Hemoglobin 12.0 - 16.0 g/dL 9.4(L) 9.5(L) 9.8(L)  Hematocrit 35.0 - 47.0 % - 28.4(L) -  Platelets 150 - 440 K/uL - 52(L) -    STUDIES: Ct Abdomen Pelvis W Contrast  Result Date: 11/21/2017 CLINICAL DATA:  62 year old with current history of hepatitis-C, hypertension and diabetes, presenting with acute onset of diarrhea today. EXAM: CT ABDOMEN AND PELVIS WITH CONTRAST TECHNIQUE: Multidetector CT imaging of the abdomen and pelvis was performed using the standard protocol following bolus administration of intravenous contrast. CONTRAST:  143mL ISOVUE-300 IOPAMIDOL INJECTION 61% IV. Oral contrast was not administered. COMPARISON:  None. FINDINGS: Lower chest: Heart size normal. Visualized lung bases clear. Varices involving the distal esophagus. Hepatobiliary: Irregularity of the hepatic contour and marked enlargement of the caudate lobe. Multiple low-attenuation masses throughout both lobes of the liver, the largest measuring approximately 1.5 cm in the posterior segment right lobe. The largest masses represent cysts  with Hounsfield units 0-10, making it likely that the smaller masses are cysts as well. Mild diffuse hepatic steatosis. Surgically absent gallbladder. No unexpected biliary ductal dilation. Pancreas: Normal in appearance without evidence of mass, ductal dilation, or inflammation. Spleen: Splenomegaly, with the spleen measuring approximately 13.7 x 6.4 x 17.3 cm, yielding a volume of approximately 758 mL. No focal splenic parenchymal abnormality. Adrenals/Urinary Tract: Normal appearing adrenal glands. Mild right hydronephrosis without evidence of right ureteral dilation. Mild left hydronephrosis without evidence of left ureteral dilation. No urinary tract calculi on either side. Benign cyst involving the lower pole of the left kidney measuring approximately 1.1 x 1.9 cm. No solid mass involving either kidney. Normal appearing decompressed urinary bladder. Stomach/Bowel: Stomach decompressed and unremarkable. Normal-appearing small bowel. Expected colonic stool burden. Liquid stool in the ascending colon. No focal abnormality involving the colon. Normal appendix in the right upper pelvis. Vascular/Lymphatic: No visible aortoiliofemoral atherosclerosis. Normal-appearing portal venous and systemic venous systems. Gastric varices along the lesser curvature. Recanalization of the umbilical vein. No pathologic lymphadenopathy. Reproductive: Uterus not visualized and presumed surgically absent. No adnexal masses. Other: Periumbilical hernia containing the gas-filled transverse colon without evidence of incarceration. Adjacent  umbilical hernia containing fat. Edema at the root of the mesentery.  No ascites. Musculoskeletal: Degenerative disc disease and spondylosis at T11-12. Facet degenerative changes on the right at L3-4 and L4-5. Severe multifactorial spinal stenosis at L4-5. IMPRESSION: 1. Hepatic cirrhosis. Multiple hepatic masses, the largest of which are cysts, making it likely that the smaller masses are also cysts.  2. Patent portal vein and patent splenic vein. 3. Portal venous hypertension with gastric varices, esophageal varices and marked splenomegaly. 4. Mild bilateral hydronephrosis, right greater than left, without associated ureteral dilation. 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 No evidence of urinary tract calculi. 5. Periumbilical hernia containing a portion of the gas-filled transverse colon without evidence of incarceration. 6. Nonspecific edema at the root of the mesentery. This is likely related to the portal hypertension. 7. Multifactorial spinal stenosis at L4-5. Electronically Signed   By: Evangeline Dakin [name]   On: 11/21/2017 16:31   @IMAGES @  Assessment:  1.  Melena-differential diagnosis includes portal hypertensive gastropathy, oozing from gastroesophageal varices, peptic ulcer disease, small bowel bleed or right colonic bleed.  It is encouraging to note the patient has had a colonoscopy within the past 5 years that was negative.  2.  Cirrhosis, child Pugh class as yet undetermined given lack of prothrombin time in admission.  We will need to order prothrombin time to get child Pugh score as well as calculate MELD.  Cirrhosis presumably secondary to #3 below.  3.  Gastroesophageal varices per CT scan.  4.  Moderate to heavy stool burden on CT scan.  5.  The symptomatic anemia-hemoglobin is decreased to 9.4 today.  Likely secondary to gastrointestinal blood loss.  Recommendations: 1.  Obtain prothrombin time with INR.  2.  Plan EGD. The patient understands the nature of the planned procedure, indications, risks, alternatives and potential complications including but not limited to bleeding, infection, perforation, damage to internal organs and possible oversedation/side effects from anesthesia. The patient agrees and gives consent to proceed.  Please refer to procedure notes for findings, recommendations and patient disposition/instructions.  3. Continue IV protonix as  ordered.   4. Will follow along.   Thank you for the consult. Please call with questions or concerns.  Olean Ree, MD  11/22/2017 11:36 AM

## 2017-11-23 ENCOUNTER — Inpatient Hospital Stay: Payer: Federal, State, Local not specified - PPO | Admitting: Anesthesiology

## 2017-11-23 ENCOUNTER — Encounter
Admission: EM | Disposition: A | Discharge status: Home / Self Care | Payer: Self-pay | Source: Home / Self Care | Attending: Internal Medicine

## 2017-11-23 ENCOUNTER — Encounter: Payer: Self-pay | Admitting: Anesthesiology

## 2017-11-23 HISTORY — PX: ESOPHAGOGASTRODUODENOSCOPY (EGD) WITH PROPOFOL: SHX5813

## 2017-11-23 LAB — HEMOGLOBIN
HEMOGLOBIN: 9.7 g/dL — AB (ref 12.0–16.0)
Hemoglobin: 9.7 g/dL — ABNORMAL LOW (ref 12.0–16.0)

## 2017-11-23 LAB — BASIC METABOLIC PANEL
Anion gap: 6 (ref 5–15)
BUN: 6 mg/dL (ref 6–20)
CHLORIDE: 114 mmol/L — AB (ref 101–111)
CO2: 20 mmol/L — ABNORMAL LOW (ref 22–32)
Calcium: 7.9 mg/dL — ABNORMAL LOW (ref 8.9–10.3)
Creatinine, Ser: 0.45 mg/dL (ref 0.44–1.00)
Glucose, Bld: 90 mg/dL (ref 65–99)
Potassium: 3.2 mmol/L — ABNORMAL LOW (ref 3.5–5.1)
SODIUM: 140 mmol/L (ref 135–145)

## 2017-11-23 LAB — PROTIME-INR
INR: 1.53
PROTHROMBIN TIME: 18.3 s — AB (ref 11.4–15.2)

## 2017-11-23 LAB — CBC
HCT: 29.4 % — ABNORMAL LOW (ref 35.0–47.0)
Hemoglobin: 9.6 g/dL — ABNORMAL LOW (ref 12.0–16.0)
MCH: 28.6 pg (ref 26.0–34.0)
MCHC: 32.6 g/dL (ref 32.0–36.0)
MCV: 87.7 fL (ref 80.0–100.0)
Platelets: 52 10*3/uL — ABNORMAL LOW (ref 150–440)
RBC: 3.35 MIL/uL — AB (ref 3.80–5.20)
RDW: 19.4 % — AB (ref 11.5–14.5)
WBC: 2.6 10*3/uL — AB (ref 3.6–11.0)

## 2017-11-23 LAB — HIV ANTIBODY (ROUTINE TESTING W REFLEX): HIV SCREEN 4TH GENERATION: NONREACTIVE

## 2017-11-23 LAB — GLUCOSE, CAPILLARY
GLUCOSE-CAPILLARY: 93 mg/dL (ref 65–99)
Glucose-Capillary: 91 mg/dL (ref 65–99)

## 2017-11-23 LAB — LACTATE DEHYDROGENASE: LDH: 168 U/L (ref 98–192)

## 2017-11-23 LAB — DAT, POLYSPECIFIC AHG (ARMC ONLY): Polyspecific AHG test: NEGATIVE

## 2017-11-23 SURGERY — ESOPHAGOGASTRODUODENOSCOPY (EGD) WITH PROPOFOL
Anesthesia: General

## 2017-11-23 MED ORDER — FENTANYL CITRATE (PF) 100 MCG/2ML IJ SOLN
INTRAMUSCULAR | Status: AC
  Filled 2017-11-23: qty 2

## 2017-11-23 MED ORDER — ENSURE ENLIVE PO LIQD: 237.0000 mL | Freq: Two times a day (BID) | ORAL | 1 refills | Status: DC

## 2017-11-23 MED ORDER — PROPOFOL 500 MG/50ML IV EMUL
INTRAVENOUS | Status: AC
  Filled 2017-11-23: qty 50

## 2017-11-23 MED ORDER — MIDAZOLAM HCL 2 MG/2ML IJ SOLN
INTRAMUSCULAR | Status: AC
  Filled 2017-11-23: qty 2

## 2017-11-23 MED ORDER — FENTANYL CITRATE (PF) 100 MCG/2ML IJ SOLN
INTRAMUSCULAR | Status: DC | PRN
  Administered 2017-11-23: 50 ug via INTRAVENOUS

## 2017-11-23 MED ORDER — ACETAMINOPHEN 325 MG PO TABS: 650.0000 mg | ORAL_TABLET | Freq: Four times a day (QID) | ORAL | Status: DC | PRN

## 2017-11-23 MED ORDER — LIDOCAINE HCL (PF) 2 % IJ SOLN
INTRAMUSCULAR | Status: AC
  Filled 2017-11-23: qty 10

## 2017-11-23 MED ORDER — MIDAZOLAM HCL 2 MG/2ML IJ SOLN
INTRAMUSCULAR | Status: DC | PRN
  Administered 2017-11-23: 2 mg via INTRAVENOUS

## 2017-11-23 MED ORDER — PANTOPRAZOLE SODIUM 40 MG PO TBEC: 40.0000 mg | DELAYED_RELEASE_TABLET | Freq: Two times a day (BID) | ORAL | Status: DC

## 2017-11-23 MED ORDER — SODIUM CHLORIDE 0.9 % IV SOLN: INTRAVENOUS | Status: DC

## 2017-11-23 MED ORDER — PROPOFOL 500 MG/50ML IV EMUL
INTRAVENOUS | Status: DC | PRN
  Administered 2017-11-23: 100 ug/kg/min via INTRAVENOUS

## 2017-11-23 MED ORDER — SENNOSIDES-DOCUSATE SODIUM 8.6-50 MG PO TABS: 1.0000 | ORAL_TABLET | Freq: Every evening | ORAL | Status: DC | PRN

## 2017-11-23 MED ORDER — ENALAPRIL MALEATE 5 MG PO TABS: 5.0000 mg | ORAL_TABLET | Freq: Every day | ORAL | 0 refills | Status: DC

## 2017-11-23 MED ORDER — PANTOPRAZOLE SODIUM 40 MG PO TBEC: 40.0000 mg | DELAYED_RELEASE_TABLET | Freq: Two times a day (BID) | ORAL | 0 refills | Status: DC

## 2017-11-23 MED ORDER — PROPOFOL 10 MG/ML IV BOLUS
INTRAVENOUS | Status: AC
  Filled 2017-11-23: qty 20

## 2017-11-23 MED ORDER — BUTAMBEN-TETRACAINE-BENZOCAINE 2-2-14 % EX AERO
INHALATION_SPRAY | CUTANEOUS | Status: AC
  Filled 2017-11-23: qty 5

## 2017-11-23 NOTE — Interval H&P Note (Signed)
History and Physical Interval Note:  11/23/2017 1:07 PM  Roberta Lane  has presented today for surgery, with the diagnosis of Melena, symptomatic anemia, HCV cirrhosis  The various methods of treatment have been discussed with the patient and family. After consideration of risks, benefits and other options for treatment, the patient has consented to  Procedure(s): ESOPHAGOGASTRODUODENOSCOPY (EGD) WITH PROPOFOL (N/A) as a surgical intervention .  The patient's history has been reviewed, patient examined, no change in status, stable for surgery.  I have reviewed the patient's chart and labs.  Questions were answered to the patient's satisfaction.     [place], [place]

## 2017-11-23 NOTE — Op Note (Signed)
[facility] Gastroenterology Patient Name: Roberta Lane Procedure Date: 11/23/2017 1:09 PM MRN: [id number] Account #: [id number] Date of Birth: [birth year] Admit Type: Inpatient Age: 62 Room: [facility] ENDO ROOM 1 Gender: Female Note Status: Finalized Procedure:            Upper GI endoscopy Indications:          Recent gastrointestinal bleeding, Iron deficiency                        anemia due to suspected upper gastrointestinal                        bleeding, Abnormal CT of the GI tract, Cirrhosis with                        suspected esophageal varices, Suspected gastric varices Providers:            Lorie Apley K. Alice Reichert MD, MD Referring MD:         Irven Easterly. Kary Kos, MD (Referring MD) Medicines:            Propofol per Anesthesia Complications:        No immediate complications. Procedure:            Pre-Anesthesia Assessment:                       - The risks and benefits of the procedure and the                        sedation options and risks were discussed with the                        patient. All questions were answered and informed                        consent was obtained.                       - Patient identification and proposed procedure were                        verified prior to the procedure by the nurse. The                        procedure was verified in the procedure room.                       - ASA Grade Assessment: III - A patient with severe                        systemic disease.                       - After reviewing the risks and benefits, the patient                        was deemed in satisfactory condition to undergo the                        procedure.  After obtaining informed consent, the endoscope was                        passed under direct vision. Throughout the procedure,                        the patient's blood pressure, pulse, and oxygen                        saturations were monitored  continuously. The Endoscope                        was introduced through the mouth, and advanced to the                        third part of duodenum. The upper GI endoscopy was                        accomplished without difficulty. The patient tolerated                        the procedure well. Findings:      Two columns of non-bleeding grade II varices were found in the middle       third of the esophagus and in the lower third of the esophagus, 27 cm       from the incisors. They were 5 mm in largest diameter. No stigmata of       recent bleeding were evident and no red wale signs were present.      Diffuse mildly friable mucosa with contact bleeding was found in the       entire examined stomach.      There is no endoscopic evidence of varices in the gastric fundus (on       retroflexion).      The exam was otherwise without abnormality.      The examined duodenum was normal. Impression:           - Non-bleeding grade II esophageal varices.                       - Friable gastric mucosa.                       - The examination was otherwise normal.                       - Normal examined duodenum.                       - No specimens collected. Recommendation:       - Resume previous diet.                       - Resume previous diet.                       - Repeat upper endoscopy in 1 year for surveillance.                       - Return to my office in 1 month.                       -  Monitor hemoglobin Procedure Code(s):    --- Professional ---                       [phone], Esophagogastroduodenoscopy, flexible, transoral;                        diagnostic, including collection of specimen(s) by                        brushing or washing, when performed (separate procedure) Diagnosis Code(s):    --- Professional ---                       K74.60, Unspecified cirrhosis of liver                       I85.10, Secondary esophageal varices without bleeding                        K31.89, Other diseases of stomach and duodenum                       K92.2, Gastrointestinal hemorrhage, unspecified                       D50.9, Iron deficiency anemia, unspecified                       R93.3, Abnormal findings on diagnostic imaging of other                        parts of digestive tract CPT copyright 2016 [employer]. All rights reserved. The codes documented in this report are preliminary and upon coder review may  be revised to meet current compliance requirements. Efrain Sella MD, MD 11/23/2017 1:30:19 PM This report has been signed electronically. Number of Addenda: 0 Note Initiated On: 11/23/2017 1:09 PM      [facility]

## 2017-11-23 NOTE — Anesthesia Post-op Follow-up Note (Signed)
Anesthesia QCDR form completed.        

## 2017-11-23 NOTE — Progress Notes (Signed)
MD making rounds. Received order to discharge home. IV removed. Patient provided with Education Handout. Prescriptions E-Scribed to pharmacy. Discharge paperwork provided, explained, signed and witnessed. No questions left unanswered Discharged via wheelchair by nursing staff. Belongings sent with patient and family.

## 2017-11-23 NOTE — Discharge Instructions (Signed)
Follow-up with primary care physician in 1 week, primary care physician to consider repeating CBC during follow-up visit Follow-up with gastroenterology Dr. Alice Reichert in 1 month Follow-up with oncology for pancytopenia in 1 week

## 2017-11-23 NOTE — Anesthesia Preprocedure Evaluation (Addendum)
Anesthesia Evaluation  Patient identified by MRN, date of birth, ID band Patient awake    Reviewed: Allergy & Precautions, H&P , NPO status , Patient's Chart, lab work & pertinent test results, reviewed documented beta blocker date and time   History of Anesthesia Complications Negative for: history of anesthetic complications  Airway Mallampati: III  TM Distance: >3 FB Neck ROM: full    Dental  (+) Dental Advidsory Given, Caps, Implants   Pulmonary neg pulmonary ROS,           Cardiovascular Exercise Tolerance: Good hypertension, (-) angina(-) CAD, (-) Past MI, (-) Cardiac Stents and (-) CABG (-) dysrhythmias + Valvular Problems/Murmurs      Neuro/Psych negative neurological ROS  negative psych ROS   GI/Hepatic GERD  ,(+) Hepatitis - (s/p treatment), C  Endo/Other  diabetes  Renal/GU negative Renal ROS  negative genitourinary   Musculoskeletal   Abdominal   Peds  Hematology negative hematology ROS (+)   Anesthesia Other Findings Past Medical History: No date: Diabetes mellitus without complication (HCC) No date: Hepatitis C No date: Hypertension   Reproductive/Obstetrics negative OB ROS                            Anesthesia Physical Anesthesia Plan  ASA: II  Anesthesia Plan: General   Post-op Pain Management:    Induction: Intravenous  PONV Risk Score and Plan: 3 and Propofol infusion  Airway Management Planned: Nasal Cannula  Additional Equipment:   Intra-op Plan:   Post-operative Plan:   Informed Consent: I have reviewed the patients History and Physical, chart, labs and discussed the procedure including the risks, benefits and alternatives for the proposed anesthesia with the patient or authorized representative who has indicated his/her understanding and acceptance.   Dental Advisory Given  Plan Discussed with: Anesthesiologist, CRNA and Surgeon  Anesthesia Plan  Comments:        Anesthesia Quick Evaluation

## 2017-11-23 NOTE — Discharge Summary (Signed)
[facility] - [place] [facility]   PATIENT NAME: Roberta Lane    MR#:  [id number]  DATE OF BIRTH:  03/10/1956  DATE OF ADMISSION:  11/21/2017 ADMITTING PHYSICIAN: Sital Mody, MD  DATE OF DISCHARGE: 11/23/17  PRIMARY CARE PHYSICIAN: Hedrick, James, MD    ADMISSION DIAGNOSIS:  UGIB (upper gastrointestinal bleed) [K92.2] Pancytopenia (HCC) [D61.818] Cirrhosis of liver without ascites, unspecified hepatic cirrhosis type (HCC) [K74.60]  DISCHARGE DIAGNOSIS:  Active Problems:   GIB (gastrointestinal bleeding)   SECONDARY DIAGNOSIS:   Past Medical History:  Diagnosis Date  . Diabetes mellitus without complication (HCC)   . Hepatitis C   . Hypertension     HOSPITAL COURSE:  HPI  Roberta Lane  is a 62 y.o. female with a known history of hepatitis see which has been fully treated since 2017 and diabetes who presents due to diarrhea and generalized weakness. Patient reports over the past week she has had headache, nausea and generalized weakness along with diarrhea. Only today she noticed very dark colored stools. She also endorses some midepigastric abdominal pain. In the ER she was guaiac positive. Patient denies taking NSAIDs or aspirin.She does endorse generalized weakness with feelings of lightheadedness. Patient recently moved to this area and will establish primary care with Dr. Hedrick  #. Melena: Differential diagnosis includes portal hypertensive gastropathy ,oozing from gastroesophageal varices, peptic ulcer disease, small bowel bleed or right colonic bleed Continue PPI p.o. twice daily Hemoglobin 9.4-10.8 9.7 and 9.7 GI Dr. Toledo has seen the patient and EGD done today reveals nonbleeding grade 2 esophageal varices.  Okay to discharge patient from GI standpoint with PPI and outpatient follow-up in 1 month is recommended for possible repeat EGD  #. Pancytopenia -multifactorial including cirrhosis, splenomegaly and recent GI  bleed Patient's hemoglobin is stable. Thrombocytopenia is likely secondary to her splenomegaly.  Antiplatelet antibodies were ordered by oncology Not considering bone marrow biopsy at this time Dr. Finnegan is recommending outpatient follow-up in 2-3 weeks for further evaluation Anemia panel ordered  Vitamin B12 and folate levels are normal.  Serum iron level at 61   #Below normal TSH  TSH-0.020, PCP to check free T4 and T3 levels and if necessary refer to endocrinology  #Hepatic cysts with hepatic cirrhosis Follow up on CT abdomen-hepatic cirrhosis with multiple hepatic masses likely hepatic cysts.  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 follow-up with gastroenterology.  # Essential hypertension: Continue enalapril, dose titrated.  Monitor if patient's rash is getting worse, in that case we will consider switching to different antihypertensive like Norvasc  #  History of rash She saw dermatologist and had a punch biopsy. Results pending  #. Diabetes:  resume home medication metformin   #hypokalemia: repletedPotassium     DISCHARGE CONDITIONS:   STABLE  CONSULTS OBTAINED:  Treatment Team:  Toledo, Teodoro K, MD Rao, Archana C, MD   PROCEDURES  EGD Two columns of non-bleeding grade II varices were found in the middle third of the esophagus and in the lower third of the esophagus, 27 cm from the incisors  DRUG ALLERGIES:   Allergies  Allergen Reactions  . Penicillins Anaphylaxis  . Codeine Itching  . Tylenol [Acetaminophen] Other (See Comments)    Breast pain  . Latex Rash    DISCHARGE MEDICATIONS:   Allergies as of 11/23/2017      Reactions   Penicillins Anaphylaxis   Codeine Itching   Tylenol [acetaminophen] Other (See Comments)   Breast   pain   Latex Rash      Medication List    TAKE these medications   acetaminophen 325 MG tablet Commonly known as:  TYLENOL Take 2 tablets (650 mg  total) by mouth every 6 (six) hours as needed for mild pain (or Fever >/= 101).   enalapril 5 MG tablet Commonly known as:  VASOTEC Take 1 tablet (5 mg total) by mouth daily. What changed:    medication strength  how much to take   feeding supplement (ENSURE ENLIVE) Liqd Take 237 mLs by mouth 2 (two) times daily between meals.   metFORMIN 500 MG tablet Commonly known as:  GLUCOPHAGE Take 1,000 mg by mouth 2 (two) times daily with a meal.   pantoprazole 40 MG tablet Commonly known as:  PROTONIX Take 1 tablet (40 mg total) by mouth 2 (two) times daily.   senna-docusate 8.6-50 MG tablet Commonly known as:  Senokot-S Take 1 tablet by mouth at bedtime as needed for mild constipation.        DISCHARGE INSTRUCTIONS:  Follow-up with primary care physician in 1 week, primary care physician to consider repeating CBC during follow-up visit Follow-up with gastroenterology Dr. Alice Reichert in 1 month Follow-up with oncology in 2 week for pancytopenia  DIET:  Diabetic diet  DISCHARGE CONDITION:  Stable  ACTIVITY:  Activity as tolerated  OXYGEN:  Home Oxygen: No.   Oxygen Delivery: room air  DISCHARGE LOCATION:  home   If you experience worsening of your admission symptoms, develop shortness of breath, life threatening emergency, suicidal or homicidal thoughts you must seek medical attention immediately by calling 911 or calling your MD immediately  if symptoms less severe.  You Must read complete instructions/literature along with all the possible adverse reactions/side effects for all the Medicines you take and that have been prescribed to you. Take any new Medicines after you have completely understood and accpet all the possible adverse reactions/side effects.   Please note  You were cared for by a hospitalist during your hospital stay. If you have any questions about your discharge medications or the care you received while you were in the hospital after you are discharged,  you can call the unit and asked to speak with the hospitalist on call if the hospitalist that took care of you is not available. Once you are discharged, your primary care physician will handle any further medical issues. Please note that NO REFILLS for any discharge medications will be authorized once you are discharged, as it is imperative that you return to your primary care physician (or establish a relationship with a primary care physician if you do not have one) for your aftercare needs so that they can reassess your need for medications and monitor your lab values.     Today  Chief Complaint  Patient presents with  . Diarrhea   Patient well.  No other episodes of bleeding.  Tolerated diet after EGD and wants to go home.  Okay to discharge patient from GI standpoint and oncology standpoint  ROS:  CONSTITUTIONAL: Denies fevers, chills. Denies any fatigue, weakness.  EYES: Denies blurry vision, double vision, eye pain. EARS, NOSE, THROAT: Denies tinnitus, ear pain, hearing loss. RESPIRATORY: Denies cough, wheeze, shortness of breath.  CARDIOVASCULAR: Denies chest pain, palpitations, edema.  GASTROINTESTINAL: Denies nausea, vomiting, diarrhea, abdominal pain. Denies bright red blood per rectum. GENITOURINARY: Denies dysuria, hematuria. ENDOCRINE: Denies nocturia or thyroid problems. HEMATOLOGIC AND LYMPHATIC: Denies easy bruising or bleeding. SKIN: Denies rash or lesion. MUSCULOSKELETAL: Denies  pain in neck, back, shoulder, knees, hips or arthritic symptoms.  NEUROLOGIC: Denies paralysis, paresthesias.  PSYCHIATRIC: Denies anxiety or depressive symptoms.   VITAL SIGNS:  Blood pressure 137/75, pulse 87, temperature 97.9 F (36.6 C), temperature source Oral, resp. rate 20, height 5' 4" (1.626 m), weight 82.4 kg (181 lb 9.6 oz), SpO2 96 %.  I/O:    Intake/Output Summary (Last 24 hours) at 11/23/2017 1727 Last data filed at 11/23/2017 1324 Gross per 24 hour  Intake 3227.92 ml   Output 0 ml  Net 3227.92 ml    PHYSICAL EXAMINATION:  GENERAL:  62 y.o.-year-old patient lying in the bed with no acute distress.  EYES: Pupils equal, round, reactive to light and accommodation. No scleral icterus. Extraocular muscles intact.  HEENT: Head atraumatic, normocephalic. Oropharynx and nasopharynx clear.  NECK:  Supple, no jugular venous distention. No thyroid enlargement, no tenderness.  LUNGS: Normal breath sounds bilaterally, no wheezing, rales,rhonchi or crepitation. No use of accessory muscles of respiration.  CARDIOVASCULAR: S1, S2 normal. No murmurs, rubs, or gallops.  ABDOMEN: Soft, non-tender, non-distended. Bowel sounds present. No organomegaly or mass.  EXTREMITIES: No pedal edema, cyanosis, or clubbing.  NEUROLOGIC: Cranial nerves II through XII are intact. Muscle strength 5/5 in all extremities. Sensation intact. Gait not checked.  PSYCHIATRIC: The patient is alert and oriented x 3.  SKIN: No obvious rash, lesion, or ulcer.   DATA REVIEW:   CBC Recent Labs  Lab 11/23/17 0415  11/23/17 1442  WBC 2.6*  --   --   HGB 9.6*   < > 9.7*  HCT 29.4*  --   --   PLT 52*  --   --    < > = values in this interval not displayed.    Chemistries  Recent Labs  Lab 11/22/17 0235 11/23/17 0415  NA 139 140  K 3.2* 3.2*  CL 113* 114*  CO2 19* 20*  GLUCOSE 94 90  BUN 7 6  CREATININE 0.42* 0.45  CALCIUM 7.8* 7.9*  AST 44*  --   ALT 20  --   ALKPHOS 89  --   BILITOT 1.9*  --     Cardiac Enzymes No results for input(s): TROPONINI in the last 168 hours.  Microbiology Results  Results for orders placed or performed during the hospital encounter of 11/21/17  C difficile quick scan w PCR reflex     Status: None   Collection Time: 11/21/17  6:01 PM  Result Value Ref Range Status   C Diff antigen NEGATIVE NEGATIVE Final   C Diff toxin NEGATIVE NEGATIVE Final   C Diff interpretation No C. difficile detected.  Final    Comment: Performed at [facility],  [address] [place], [place] [address]    RADIOLOGY:  Ct Abdomen Pelvis W Contrast  Result Date: 11/21/2017 CLINICAL DATA:  62 year old with current history of hepatitis-C, hypertension and diabetes, presenting with acute onset of diarrhea today. EXAM: CT ABDOMEN AND PELVIS WITH CONTRAST TECHNIQUE: Multidetector CT imaging of the abdomen and pelvis was performed using the standard protocol following bolus administration of intravenous contrast. CONTRAST:  136m ISOVUE-300 IOPAMIDOL INJECTION 61% IV. Oral contrast was not administered. COMPARISON:  None. FINDINGS: Lower chest: Heart size normal. Visualized lung bases clear. Varices involving the distal esophagus. Hepatobiliary: Irregularity of the hepatic contour and marked enlargement of the caudate lobe. Multiple low-attenuation masses throughout both lobes of the liver, the largest measuring approximately 1.5 cm in the posterior segment right lobe. The largest masses  represent cysts with Hounsfield units 0-10, making it likely that the smaller masses are cysts as well. Mild diffuse hepatic steatosis. Surgically absent gallbladder. No unexpected biliary ductal dilation. Pancreas: Normal in appearance without evidence of mass, ductal dilation, or inflammation. Spleen: Splenomegaly, with the spleen measuring approximately 13.7 x 6.4 x 17.3 cm, yielding a volume of approximately 758 mL. No focal splenic parenchymal abnormality. Adrenals/Urinary Tract: Normal appearing adrenal glands. Mild right hydronephrosis without evidence of right ureteral dilation. Mild left hydronephrosis without evidence of left ureteral dilation. No urinary tract calculi on either side. Benign cyst involving the lower pole of the left kidney measuring approximately 1.1 x 1.9 cm. No solid mass involving either kidney. Normal appearing decompressed urinary bladder. Stomach/Bowel: Stomach decompressed and unremarkable. Normal-appearing small bowel. Expected colonic stool burden. Liquid  stool in the ascending colon. No focal abnormality involving the colon. Normal appendix in the right upper pelvis. Vascular/Lymphatic: No visible aortoiliofemoral atherosclerosis. Normal-appearing portal venous and systemic venous systems. Gastric varices along the lesser curvature. Recanalization of the umbilical vein. No pathologic lymphadenopathy. Reproductive: Uterus not visualized and presumed surgically absent. No adnexal masses. Other: Periumbilical hernia containing the gas-filled transverse colon without evidence of incarceration. Adjacent umbilical hernia containing fat. Edema at the root of the mesentery.  No ascites. Musculoskeletal: Degenerative disc disease and spondylosis at T11-12. Facet degenerative changes on the right at L3-4 and L4-5. Severe multifactorial spinal stenosis at L4-5. IMPRESSION: 1. Hepatic cirrhosis. Multiple hepatic masses, the largest of which are cysts, making it likely that the smaller masses are also cysts. 2. Patent portal vein and patent splenic vein. 3. Portal venous hypertension with gastric varices, esophageal varices and marked splenomegaly. 4. Mild bilateral hydronephrosis, right greater than left, without associated ureteral dilation. 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 No evidence of urinary tract calculi. 5. Periumbilical hernia containing a portion of the gas-filled transverse colon without evidence of incarceration. 6. Nonspecific edema at the root of the mesentery. This is likely related to the portal hypertension. 7. Multifactorial spinal stenosis at L4-5. Electronically Signed   By: Thomas  Lawrence [name]   On: 11/21/2017 16:31    EKG:   Orders placed or performed during the hospital encounter of 11/21/17  . EKG 12-Lead  . EKG 12-Lead      Management plans discussed with the patient, family and they are in agreement.  CODE STATUS:     Code Status Orders  (From admission, onward)        Start     Ordered   11/21/17 2029  Full  code  Continuous     11/21/17 2028    Code Status History    Date Active Date Inactive Code Status Order ID Comments User Context   This patient has a current code status but no historical code status.      TOTAL TIME TAKING CARE OF THIS PATIENT: 43  minutes.   Note: This dictation was prepared with Dragon dictation along with smaller phrase technology. Any transcriptional errors that result from this process are unintentional.   @MEC@  on 11/23/2017 at 5:27 PM  Between 7am to 6pm - Pager - [phone]  After 6pm go to [email or web address] - password [email or web address]  [facility]  Office  [phone]  CC: Primary care physician; Hedrick, James, MD    

## 2017-11-23 NOTE — Anesthesia Procedure Notes (Signed)
Performed by: Cook-Martin, Janari Gagner Pre-anesthesia Checklist: Patient identified, Emergency Drugs available, Suction available, Patient being monitored and Timeout performed Patient Re-evaluated:Patient Re-evaluated prior to induction Oxygen Delivery Method: Nasal cannula Preoxygenation: Pre-oxygenation with 100% oxygen Induction Type: IV induction Airway Equipment and Method: Bite block Placement Confirmation: CO2 detector and positive ETCO2       

## 2017-11-23 NOTE — Anesthesia Postprocedure Evaluation (Signed)
Anesthesia Post Note  Patient: Katheleen Stella  Procedure(s) Performed: ESOPHAGOGASTRODUODENOSCOPY (EGD) WITH PROPOFOL (N/A )  Patient location during evaluation: Endoscopy Anesthesia Type: General Level of consciousness: awake and alert Pain management: pain level controlled Vital Signs Assessment: post-procedure vital signs reviewed and stable Respiratory status: spontaneous breathing, nonlabored ventilation, respiratory function stable and patient connected to nasal cannula oxygen Cardiovascular status: blood pressure returned to baseline and stable Postop Assessment: no apparent nausea or vomiting Anesthetic complications: no     Last Vitals:  Vitals:   11/23/17 1350 11/23/17 1400  BP: 120/70 117/68  Pulse: 85 83  Resp: (!) 23 17  Temp:    SpO2: 97% 92%    Last Pain:  Vitals:   11/23/17 1330  TempSrc: Tympanic  PainSc: Asleep                 Martha Clan

## 2017-11-23 NOTE — Transfer of Care (Signed)
Immediate Anesthesia Transfer of Care Note  Patient: Roberta Lane  Procedure(s) Performed: ESOPHAGOGASTRODUODENOSCOPY (EGD) WITH PROPOFOL (N/A )  Patient Location: PACU  Anesthesia Type:General  Level of Consciousness: awake and sedated  Airway & Oxygen Therapy: Patient Spontanous Breathing and Patient connected to nasal cannula oxygen  Post-op Assessment: Report given to RN and Post -op Vital signs reviewed and stable  Post vital signs: Reviewed and stable  Last Vitals:  Vitals:   11/23/17 0932 11/23/17 1308  BP: 140/64 (!) 145/70  Pulse: 80 86  Resp:  (!) 24  Temp:  36.6 C  SpO2:  98%    Last Pain:  Vitals:   11/23/17 1308  TempSrc: Tympanic  PainSc: 0-No pain      Patients Stated Pain Goal: 0 (96/75/91 6384)  Complications: No apparent anesthesia complications

## 2017-11-24 ENCOUNTER — Other Ambulatory Visit: Payer: Self-pay

## 2017-11-24 ENCOUNTER — Emergency Department
Admission: EM | Admit: 2017-11-24 | Discharge: 2017-11-24 | Disposition: A | Discharge status: Home / Self Care | Payer: Federal, State, Local not specified - PPO | Attending: Emergency Medicine | Admitting: Emergency Medicine

## 2017-11-24 DIAGNOSIS — Z760 Encounter for issue of repeat prescription: Secondary | ICD-10-CM | POA: Diagnosis present

## 2017-11-24 DIAGNOSIS — Z91040 Latex allergy status: Secondary | ICD-10-CM | POA: Diagnosis not present

## 2017-11-24 DIAGNOSIS — E119 Type 2 diabetes mellitus without complications: Secondary | ICD-10-CM | POA: Diagnosis not present

## 2017-11-24 DIAGNOSIS — Z7984 Long term (current) use of oral hypoglycemic drugs: Secondary | ICD-10-CM | POA: Insufficient documentation

## 2017-11-24 DIAGNOSIS — I10 Essential (primary) hypertension: Secondary | ICD-10-CM | POA: Diagnosis not present

## 2017-11-24 DIAGNOSIS — Z79899 Other long term (current) drug therapy: Secondary | ICD-10-CM | POA: Diagnosis not present

## 2017-11-24 LAB — DIRECT PLATELET ANTIBODY
PLT ASSOC. ANTI-IA/IIA: NEGATIVE
Plt Assoc. Anti-IB/IX: NEGATIVE
Plt Assoc. Anti-IIB/IIIA: POSITIVE — AB

## 2017-11-24 LAB — AFP TUMOR MARKER: AFP, Serum, Tumor Marker: 2 ng/mL (ref 0.0–8.3)

## 2017-11-24 LAB — HAPTOGLOBIN: Haptoglobin: <10 mg/dL — ABNORMAL LOW (ref 34–200)

## 2017-11-24 MED ORDER — AMLODIPINE BESYLATE 5 MG PO TABS
5.0000 mg | ORAL_TABLET | Freq: Once | ORAL | Status: AC
  Administered 2017-11-24: 5 mg via ORAL
  Filled 2017-11-24: qty 1

## 2017-11-24 MED ORDER — AMLODIPINE BESYLATE 5 MG PO TABS: 5.0000 mg | ORAL_TABLET | Freq: Every day | ORAL | 2 refills | Status: DC

## 2017-11-24 NOTE — ED Provider Notes (Signed)
[facility] Emergency Department Provider Note  ____________________________________________  Time seen: Approximately 10:28 PM  I have reviewed the triage vital signs and the nursing notes.   HISTORY  Chief Complaint Medication Refill    HPI Roberta Lane is a 62 y.o. female who presents the emergency department requesting different blood pressure medication.  Patient was admitted to the hospital several days ago, discharged home with a prescription for enalapril.  Patient reports that she has taken enalapril in the past and had allergic reaction to same.  While she was here, she was given Norvasc which she tolerated well.  Discharging physician was supposed to prescribe Norvasc, but somehow a prescription for enalapril was sent to her pharmacy.  Patient is here for prescription of Norvasc only.  She has no complaints of headache, visual changes, chest pain, shortness of breath, abdominal pain, nausea or vomiting, diarrhea or constipation.  Patient reports that she has never had a history of arrhythmia for bradycardia.  Patient reports that she was on a 5 mg dosage here in the hospital and did well with a very well controlled blood pressure and no cardiac symptoms.  She is requesting same medication.  Past Medical History:  Diagnosis Date  . Diabetes mellitus without complication ([place])   . Hepatitis C   . Hypertension     Patient Active Problem List   Diagnosis Date Noted  . GIB (gastrointestinal bleeding) 11/21/2017    Past Surgical History:  Procedure Laterality Date  . ESOPHAGOGASTRODUODENOSCOPY (EGD) WITH PROPOFOL N/A 11/23/2017   Procedure: ESOPHAGOGASTRODUODENOSCOPY (EGD) WITH PROPOFOL;  Surgeon: Toledo, Benay Pike, MD;  Location: [facility] ENDOSCOPY;  Service: Gastroenterology;  Laterality: N/A;    Prior to Admission medications   Medication Sig Start Date End Date Taking? Authorizing Provider  acetaminophen (TYLENOL) 325 MG tablet Take 2 tablets (650  mg total) by mouth every 6 (six) hours as needed for mild pain (or Fever >/= 101). 11/23/17   Gouru, Illene Silver, MD  amLODipine (NORVASC) 5 MG tablet Take 1 tablet (5 mg total) by mouth daily. 11/24/17 11/24/18  Kewon Statler, Charline Bills, PA-C  enalapril (VASOTEC) 5 MG tablet Take 1 tablet (5 mg total) by mouth daily. 11/23/17   Gouru, Illene Silver, MD  feeding supplement, ENSURE ENLIVE, (ENSURE ENLIVE) LIQD Take 237 mLs by mouth 2 (two) times daily between meals. 11/23/17   Nicholes Mango, MD  metFORMIN (GLUCOPHAGE) 500 MG tablet Take 1,000 mg by mouth 2 (two) times daily with a meal.     [provider]  pantoprazole (PROTONIX) 40 MG tablet Take 1 tablet (40 mg total) by mouth 2 (two) times daily. 11/23/17   Gouru, Illene Silver, MD  senna-docusate (SENOKOT-S) 8.6-50 MG tablet Take 1 tablet by mouth at bedtime as needed for mild constipation. 11/23/17   Nicholes Mango, MD    Allergies Penicillins; Codeine; Enalapril; Tylenol [acetaminophen]; and Latex  No family history on file.  Social History Social History   Tobacco Use  . Smoking status: Never Smoker  . Smokeless tobacco: Never Used  Substance Use Topics  . Alcohol use: Yes  . Drug use: No     Review of Systems  Constitutional: No fever/chills Eyes: No visual changes. Cardiovascular: no chest pain. Respiratory: no cough. No SOB. Gastrointestinal: No abdominal pain.  No nausea, no vomiting.  Musculoskeletal: Negative for musculoskeletal pain. Skin: Negative for rash, abrasions, lacerations, ecchymosis. Neurological: Negative for headaches, focal weakness or numbness. 10-point ROS otherwise negative.  ____________________________________________   PHYSICAL EXAM:  VITAL SIGNS: ED Triage  Vitals [11/24/17 2140]  Enc Vitals Group     BP (!) 150/76     Pulse Rate 84     Resp 17     Temp 97.7 F (36.5 C)     Temp Source Oral     SpO2 97 %     Weight      Height      Head Circumference      Peak Flow      Pain Score      Pain Loc      Pain  Edu?      Excl. in [place]?      Constitutional: Alert and oriented. Well appearing and in no acute distress. Eyes: Conjunctivae are normal. PERRL. EOMI. Head: Atraumatic. Neck: No stridor.    Cardiovascular: Normal rate, regular rhythm. Normal S1 and S2.  Good peripheral circulation. Respiratory: Normal respiratory effort without tachypnea or retractions. Lungs CTAB. Good air entry to the bases with no decreased or absent breath sounds. Musculoskeletal: Full range of motion to all extremities. No gross deformities appreciated. Neurologic:  Normal speech and language. No gross focal neurologic deficits are appreciated.  Skin:  Skin is warm, dry and intact. No rash noted. Psychiatric: Mood and affect are normal. Speech and behavior are normal. Patient exhibits appropriate insight and judgement.   ____________________________________________   LABS (all labs ordered are listed, but only abnormal results are displayed)  Labs Reviewed - No data to display ____________________________________________  EKG   ____________________________________________  RADIOLOGY   No results found.  ____________________________________________    PROCEDURES  Procedure(s) performed:    Procedures    Medications  amLODipine (NORVASC) tablet 5 mg (not administered)     ____________________________________________   INITIAL IMPRESSION / ASSESSMENT AND PLAN / ED COURSE  Pertinent labs & imaging results that were available during my care of the patient were reviewed by me and considered in my medical decision making (see chart for details).  Review of the [place] CSRS was performed in accordance of the [place] prior to dispensing any controlled drugs.     Patient's diagnosis is consistent with medication refill for her hypertension.  Patient was prescribed the wrong medication after her inpatient stay.  Patient is requesting Norvasc instead of the enalapril which causes him allergic reaction.   No complaints at this time.  No indication for workup at this time.  Patient is going to be discharged home with Norvasc, 5 mg tablets.  She has her first primary care visit with her primary care on 12/13/2017.  [place] care for the first time so as such she was unable to call her primary care for change of medication.. Patient is given ED precautions to return to the ED for any worsening or new symptoms.     ____________________________________________  FINAL CLINICAL IMPRESSION(S) / ED DIAGNOSES  Final diagnoses:  Medication refill  Essential hypertension      NEW MEDICATIONS STARTED DURING THIS VISIT:  ED Discharge Orders        Ordered    amLODipine (NORVASC) 5 MG tablet  Daily     11/24/17 2240          This chart was dictated using voice recognition software/Dragon. Despite best efforts to proofread, errors can occur which can change the meaning. Any change was purely unintentional.    Darletta Moll, PA-C 11/24/17 [place], [place], MD 11/25/17 [phone]

## 2017-11-24 NOTE — ED Notes (Signed)
Pt verbalizes understanding of discharge instructions.

## 2017-11-24 NOTE — ED Triage Notes (Signed)
Patient reports she is here today to obtain a prescription for Norvasc. Patient reports she was recently discharged from hospital for severe dehydration/diarrhea. Patient reports she was discharged with prescription for enalapril, which she is allergic to. Patient has new PCP and can not get in to be seen until 2/20.

## 2017-11-28 LAB — NEUTROPHIL AB TEST LEVEL 1: NEUTROPHIL SCR/PANEL INTERP.: POSITIVE — AB

## 2017-12-10 DIAGNOSIS — D61818 Other pancytopenia: Secondary | ICD-10-CM | POA: Insufficient documentation

## 2017-12-10 NOTE — Progress Notes (Signed)
Roberta Lane  Telephone:(336) [phone] Fax:(336) [phone]  ID: Roberta Lane OB: 10/15/1956  MR#: [id number]  CSN#:[id number]  Patient Care Team: [place] Pink, MD as PCP - General (Family Medicine)  CHIEF COMPLAINT: Pancytopenia  INTERVAL HISTORY: Patient is a 62 year old female who was initially evaluated as an inpatient with a past medical history significant for treated hepatitis C and cirrhosis.  She had heme positive stools and was noted to be significantly pancytopenic. Since discharge she feels well and is nearly back to her baseline.  Her only complaint is of an abdominal hernia that occasionally bothers her. She has no neurologic complaints.  She denies any recent fevers or illnesses.  She has a good appetite and denies weight loss.  She denies any chest pain, cough, hemoptysis, or shortness of breath.  She denies any nausea, vomiting, constipation, or diarrhea.  She has no melena or hematochezia. She has no urinary complaints.  Patient offers no further specific complaints today.  REVIEW OF SYSTEMS:   Review of Systems  Constitutional: Negative.  Negative for fever, malaise/fatigue and weight loss.  Respiratory: Negative.  Negative for cough, hemoptysis and shortness of breath.   Cardiovascular: Negative.  Negative for chest pain and leg swelling.  Gastrointestinal: Negative.  Negative for abdominal pain, blood in stool and melena.  Genitourinary: Negative.  Negative for hematuria.  Musculoskeletal: Negative.   Skin: Negative.  Negative for rash.  Neurological: Negative.  Negative for weakness.  Psychiatric/Behavioral: The patient is nervous/anxious.     As per HPI. Otherwise, a complete review of systems is negative.  PAST MEDICAL HISTORY: Past Medical History:  Diagnosis Date  . Diabetes mellitus without complication ([place])   . Hepatitis C   . Hypertension     PAST SURGICAL HISTORY: Past Surgical History:  Procedure Laterality Date  .  ABDOMINAL HYSTERECTOMY    . ESOPHAGOGASTRODUODENOSCOPY (EGD) WITH PROPOFOL N/A 11/23/2017   Procedure: ESOPHAGOGASTRODUODENOSCOPY (EGD) WITH PROPOFOL;  Surgeon: Toledo, Benay Pike, MD;  Location: [facility] ENDOSCOPY;  Service: Gastroenterology;  Laterality: N/A;    FAMILY HISTORY: Family History  Problem Relation Age of Onset  . Diabetes Mother   . Heart attack Mother   . Hypertension Father   . Thyroid disease Sister   . Sickle cell anemia Sister     ADVANCED DIRECTIVES (Y/N):  N  HEALTH MAINTENANCE: Social History   Tobacco Use  . Smoking status: Never Smoker  . Smokeless tobacco: Never Used  Substance Use Topics  . Alcohol use: Yes    Comment: socially  . Drug use: No     Colonoscopy:  PAP:  Bone density:  Lipid panel:  Allergies  Allergen Reactions  . Penicillins Anaphylaxis  . Diphenhydramine-Acetaminophen Other (See Comments)    Made my breast hurt.  . Enalapril   . Sulfa Antibiotics     Reaction unknown.  . Tylenol [Acetaminophen] Other (See Comments)    Breast pain  . Codeine Itching  . Latex Rash    Current Outpatient Medications  Medication Sig Dispense Refill  . amLODipine (NORVASC) 5 MG tablet Take 1 tablet (5 mg total) by mouth daily. 30 tablet 2  . metFORMIN (GLUCOPHAGE) 500 MG tablet Take 1,000 mg by mouth 2 (two) times daily with a meal.     . acetaminophen (TYLENOL) 325 MG tablet Take 2 tablets (650 mg total) by mouth every 6 (six) hours as needed for mild pain (or Fever >/= 101). (Patient not taking: Reported on 12/12/2017)    . clobetasol ointment (TEMOVATE) 0.05 %  APP TOPICALLY AA BID  1   No current facility-administered medications for this visit.     OBJECTIVE: Vitals:   12/12/17 1543  BP: (!) 166/79  Pulse: (!) 101  Resp: 18  Temp: 98.5 F (36.9 C)     Body mass index is 27.08 kg/m.    ECOG FS:0 - Asymptomatic  General: Well-developed, well-nourished, no acute distress. Eyes: Pink conjunctiva, anicteric sclera. HEENT:  Normocephalic, moist mucous membranes, clear oropharnyx. Lungs: Clear to auscultation bilaterally. Heart: Regular rate and rhythm. No rubs, murmurs, or gallops. Abdomen: Mildly distended, easily palpable midline hernia.  Splenomegaly.   Musculoskeletal: No edema, cyanosis, or clubbing. Neuro: Alert, answering all questions appropriately. Cranial nerves grossly intact. Skin: No rashes or petechiae noted. Psych: Normal affect. Lymphatics: No cervical, calvicular, axillary or inguinal LAD.   LAB RESULTS:  Lab Results  Component Value Date   NA 140 11/23/2017   K 3.2 (L) 11/23/2017   CL 114 (H) 11/23/2017   CO2 20 (L) 11/23/2017   GLUCOSE 90 11/23/2017   BUN 6 11/23/2017   CREATININE 0.45 11/23/2017   CALCIUM 7.9 (L) 11/23/2017   PROT 6.1 (L) 11/22/2017   ALBUMIN 2.5 (L) 11/22/2017   AST 44 (H) 11/22/2017   ALT 20 11/22/2017   ALKPHOS 89 11/22/2017   BILITOT 1.9 (H) 11/22/2017   GFRNONAA >60 11/23/2017   GFRAA >60 11/23/2017    Lab Results  Component Value Date   WBC 4.0 12/12/2017   NEUTROABS PENDING 12/12/2017   HGB 11.1 (L) 12/12/2017   HCT 34.2 (L) 12/12/2017   MCV 86.6 12/12/2017   PLT 92 (L) 12/12/2017     STUDIES: Ct Abdomen Pelvis W Contrast  Result Date: 11/21/2017 CLINICAL DATA:  62 year old with current history of hepatitis-C, hypertension and diabetes, presenting with acute onset of diarrhea today. EXAM: CT ABDOMEN AND PELVIS WITH CONTRAST TECHNIQUE: Multidetector CT imaging of the abdomen and pelvis was performed using the standard protocol following bolus administration of intravenous contrast. CONTRAST:  146m ISOVUE-300 IOPAMIDOL INJECTION 61% IV. Oral contrast was not administered. COMPARISON:  None. FINDINGS: Lower chest: Heart size normal. Visualized lung bases clear. Varices involving the distal esophagus. Hepatobiliary: Irregularity of the hepatic contour and marked enlargement of the caudate lobe. Multiple low-attenuation masses throughout both lobes  of the liver, the largest measuring approximately 1.5 cm in the posterior segment right lobe. The largest masses represent cysts with Hounsfield units 0-10, making it likely that the smaller masses are cysts as well. Mild diffuse hepatic steatosis. Surgically absent gallbladder. No unexpected biliary ductal dilation. Pancreas: Normal in appearance without evidence of mass, ductal dilation, or inflammation. Spleen: Splenomegaly, with the spleen measuring approximately 13.7 x 6.4 x 17.3 cm, yielding a volume of approximately 758 mL. No focal splenic parenchymal abnormality. Adrenals/Urinary Tract: Normal appearing adrenal glands. Mild right hydronephrosis without evidence of right ureteral dilation. Mild left hydronephrosis without evidence of left ureteral dilation. No urinary tract calculi on either side. Benign cyst involving the lower pole of the left kidney measuring approximately 1.1 x 1.9 cm. No solid mass involving either kidney. Normal appearing decompressed urinary bladder. Stomach/Bowel: Stomach decompressed and unremarkable. Normal-appearing small bowel. Expected colonic stool burden. Liquid stool in the ascending colon. No focal abnormality involving the colon. Normal appendix in the right upper pelvis. Vascular/Lymphatic: No visible aortoiliofemoral atherosclerosis. Normal-appearing portal venous and systemic venous systems. Gastric varices along the lesser curvature. Recanalization of the umbilical vein. No pathologic lymphadenopathy. Reproductive: Uterus not visualized and presumed surgically absent. No adnexal  masses. Other: Periumbilical hernia containing the gas-filled transverse colon without evidence of incarceration. Adjacent umbilical hernia containing fat. Edema at the root of the mesentery.  No ascites. Musculoskeletal: Degenerative disc disease and spondylosis at T11-12. Facet degenerative changes on the right at L3-4 and L4-5. Severe multifactorial spinal stenosis at L4-5. IMPRESSION: 1.  Hepatic cirrhosis. Multiple hepatic masses, the largest of which are cysts, making it likely that the smaller masses are also cysts. 2. Patent portal vein and patent splenic vein. 3. Portal venous hypertension with gastric varices, esophageal varices and marked splenomegaly. 4. Mild bilateral hydronephrosis, right greater than left, without associated ureteral dilation. 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 No evidence of urinary tract calculi. 5. Periumbilical hernia containing a portion of the gas-filled transverse colon without evidence of incarceration. 6. Nonspecific edema at the root of the mesentery. This is likely related to the portal hypertension. 7. Multifactorial spinal stenosis at L4-5. Electronically Signed   By: Evangeline Dakin [name]   On: 11/21/2017 16:31    ASSESSMENT: Pancytopenia  PLAN:    1.  Pancytopenia: Multifactorial including cirrhosis, splenomegaly of greater than 17 cm causing sequestration, and recent GI bleed.  Patient's white blood cell count is now within normal limits.  She was noted to have antineutrophil antibodies, but this is likely a false positive.  Her hemoglobin has improved and is now 11.1.  EGD revealed esophageal varices, but no active bleeding.  Finally, she remains thrombocytopenic likely secondary to her splenomegaly but her platelet count has improved since her hospitalization.  She was also noted to have positive platelet antibodies, but these are also likely a false positive.  No intervention is needed at this time.  Patient does not require bone marrow biopsy.  Return to clinic in 3 months with repeat laboratory work and further evaluation.   2.  Cirrhosis: AFP is within normal limits.  Patient has an appointment with GI tomorrow. 3.  GI bleed: No recent evidence of bleed and patient's hemoglobin is improving.  EGD results noted above.  Follow-up with GI tomorrow.    Approximately 30 minutes was spent in discussion of which greater than 50%  was consultation.   Patient expressed understanding and was in agreement with this plan. She also understands that She can call clinic at any time with any questions, concerns, or complaints.    Lloyd Huger, MD   12/12/2017 5:02 PM

## 2017-12-12 ENCOUNTER — Other Ambulatory Visit: Payer: Self-pay

## 2017-12-12 ENCOUNTER — Inpatient Hospital Stay: Payer: Federal, State, Local not specified - PPO | Attending: Oncology | Admitting: Oncology

## 2017-12-12 ENCOUNTER — Encounter: Payer: Self-pay | Admitting: Oncology

## 2017-12-12 ENCOUNTER — Inpatient Hospital Stay: Payer: Federal, State, Local not specified - PPO

## 2017-12-12 VITALS — BP 166/79 | HR 101 | Temp 98.5°F | Resp 18 | Ht 67.72 in | Wt 176.6 lb

## 2017-12-12 DIAGNOSIS — K469 Unspecified abdominal hernia without obstruction or gangrene: Secondary | ICD-10-CM | POA: Insufficient documentation

## 2017-12-12 DIAGNOSIS — E119 Type 2 diabetes mellitus without complications: Secondary | ICD-10-CM | POA: Diagnosis not present

## 2017-12-12 DIAGNOSIS — B1920 Unspecified viral hepatitis C without hepatic coma: Secondary | ICD-10-CM | POA: Insufficient documentation

## 2017-12-12 DIAGNOSIS — I8500 Esophageal varices without bleeding: Secondary | ICD-10-CM | POA: Insufficient documentation

## 2017-12-12 DIAGNOSIS — D61818 Other pancytopenia: Secondary | ICD-10-CM | POA: Insufficient documentation

## 2017-12-12 DIAGNOSIS — Z7984 Long term (current) use of oral hypoglycemic drugs: Secondary | ICD-10-CM | POA: Insufficient documentation

## 2017-12-12 DIAGNOSIS — Z79899 Other long term (current) drug therapy: Secondary | ICD-10-CM | POA: Diagnosis not present

## 2017-12-12 DIAGNOSIS — I10 Essential (primary) hypertension: Secondary | ICD-10-CM | POA: Diagnosis not present

## 2017-12-12 DIAGNOSIS — R161 Splenomegaly, not elsewhere classified: Secondary | ICD-10-CM | POA: Diagnosis not present

## 2017-12-12 LAB — CBC WITH DIFFERENTIAL/PLATELET
BAND NEUTROPHILS: 2 %
BLASTS: 0 %
Basophils Absolute: 0 10*3/uL (ref 0–0.1)
Basophils Relative: 0 %
EOS ABS: 0.2 10*3/uL (ref 0–0.7)
Eosinophils Relative: 4 %
HEMATOCRIT: 34.2 % — AB (ref 35.0–47.0)
Hemoglobin: 11.1 g/dL — ABNORMAL LOW (ref 12.0–16.0)
Lymphocytes Relative: 36 %
Lymphs Abs: 1.4 10*3/uL (ref 1.0–3.6)
MCH: 28.1 pg (ref 26.0–34.0)
MCHC: 32.5 g/dL (ref 32.0–36.0)
MCV: 86.6 fL (ref 80.0–100.0)
METAMYELOCYTES PCT: 0 %
MONOS PCT: 2 %
MYELOCYTES: 0 %
Monocytes Absolute: 0.1 10*3/uL — ABNORMAL LOW (ref 0.2–0.9)
NEUTROS ABS: 2.3 10*3/uL (ref 1.4–6.5)
Neutrophils Relative %: 56 %
Other: 0 %
PLATELETS: 92 10*3/uL — AB (ref 150–440)
Promyelocytes Absolute: 0 %
RBC: 3.95 MIL/uL (ref 3.80–5.20)
RDW: 19.8 % — AB (ref 11.5–14.5)
WBC: 4 10*3/uL (ref 3.6–11.0)
nRBC: 0 /100 WBC

## 2017-12-12 NOTE — Progress Notes (Signed)
Here for pt hospital follow up appt

## 2017-12-14 ENCOUNTER — Other Ambulatory Visit: Payer: Self-pay | Admitting: Internal Medicine

## 2017-12-14 DIAGNOSIS — K7031 Alcoholic cirrhosis of liver with ascites: Secondary | ICD-10-CM

## 2017-12-14 DIAGNOSIS — K219 Gastro-esophageal reflux disease without esophagitis: Secondary | ICD-10-CM | POA: Insufficient documentation

## 2017-12-14 DIAGNOSIS — R1084 Generalized abdominal pain: Secondary | ICD-10-CM | POA: Insufficient documentation

## 2017-12-18 ENCOUNTER — Ambulatory Visit
Admission: RE | Admit: 2017-12-18 | Discharge: 2017-12-18 | Disposition: A | Discharge status: Home / Self Care | Payer: Federal, State, Local not specified - PPO | Source: Ambulatory Visit | Attending: Internal Medicine | Admitting: Internal Medicine

## 2018-01-12 ENCOUNTER — Ambulatory Visit
Admission: RE | Admit: 2018-01-12 | Discharge: 2018-01-12 | Disposition: A | Discharge status: Home / Self Care | Payer: Federal, State, Local not specified - PPO | Source: Ambulatory Visit | Attending: Internal Medicine | Admitting: Internal Medicine

## 2018-01-12 DIAGNOSIS — K7031 Alcoholic cirrhosis of liver with ascites: Secondary | ICD-10-CM

## 2018-01-12 LAB — BODY FLUID CELL COUNT WITH DIFFERENTIAL
Eos, Fluid: 0 %
LYMPHS FL: 46 %
Monocyte-Macrophage-Serous Fluid: 49 %
Neutrophil Count, Fluid: 5 %
OTHER CELLS FL: 0 %
Total Nucleated Cell Count, Fluid: 302 cu mm

## 2018-01-12 LAB — PROTEIN, PLEURAL OR PERITONEAL FLUID

## 2018-01-12 LAB — ALBUMIN, PLEURAL OR PERITONEAL FLUID

## 2018-01-13 LAB — PROTEIN, BODY FLUID (OTHER): TOTAL PROTEIN, BODY FLUID OTHER: 1.1 g/dL

## 2018-01-13 NOTE — Procedures (Signed)
PROCEDURE SUMMARY:  Successful US guided paracentesis from left lateral abdomen.  Yielded 2.6 liters of cloudy yellow fluid.  No immediate complications.  Pt tolerated well.   Specimen was sent for labs.  Nivek Powley S Sol Odor PA-C 01/13/2018 1:52 PM

## 2018-01-15 LAB — ACID FAST SMEAR (AFB, MYCOBACTERIA): Acid Fast Smear: NEGATIVE

## 2018-01-15 LAB — BODY FLUID CULTURE: CULTURE: NO GROWTH

## 2018-01-15 LAB — CYTOLOGY - NON PAP

## 2018-01-15 LAB — ACID FAST SMEAR (AFB)

## 2018-02-26 LAB — ACID FAST CULTURE WITH REFLEXED SENSITIVITIES: ACID FAST CULTURE - AFSCU3: NEGATIVE

## 2018-03-11 NOTE — Progress Notes (Signed)
[place]  Telephone:(336) [phone] Fax:(336) [phone]  ID: Roberta Lane OB: 03/14/1956  MR#: [id number]  CSN#:[id number]  Patient Care Team: [place] Pink, MD as PCP - General (Family Medicine)  CHIEF COMPLAINT: Pancytopenia  INTERVAL HISTORY: Patient returns to clinic today for further evaluation and repeat laboratory work.  She currently feels well and is asymptomatic. She has no neurologic complaints.  She denies any recent fevers or illnesses.  She has a good appetite and denies weight loss.  She denies any chest pain, cough, hemoptysis, or shortness of breath.  She denies any abdominal pain. She denies any nausea, vomiting, constipation, or diarrhea.  She has no melena or hematochezia. She has no urinary complaints.    Patient feels at her baseline offers no specific complaints today.  REVIEW OF SYSTEMS:   Review of Systems  Constitutional: Negative.  Negative for fever, malaise/fatigue and weight loss.  Respiratory: Negative.  Negative for cough, hemoptysis and shortness of breath.   Cardiovascular: Negative.  Negative for chest pain and leg swelling.  Gastrointestinal: Negative.  Negative for abdominal pain, blood in stool and melena.  Genitourinary: Negative.  Negative for dysuria and hematuria.  Musculoskeletal: Negative.  Negative for back pain.  Skin: Negative.  Negative for rash.  Neurological: Negative.  Negative for sensory change, focal weakness and weakness.  Psychiatric/Behavioral: Negative.  The patient is not nervous/anxious.     As per HPI. Otherwise, a complete review of systems is negative.  PAST MEDICAL HISTORY: Past Medical History:  Diagnosis Date  . Diabetes mellitus without complication ([place])   . Hepatitis C   . Hypertension     PAST SURGICAL HISTORY: Past Surgical History:  Procedure Laterality Date  . ABDOMINAL HYSTERECTOMY    . ESOPHAGOGASTRODUODENOSCOPY (EGD) WITH PROPOFOL N/A 11/23/2017   Procedure:  ESOPHAGOGASTRODUODENOSCOPY (EGD) WITH PROPOFOL;  Surgeon: Toledo, Benay Pike, MD;  Location: [facility] ENDOSCOPY;  Service: Gastroenterology;  Laterality: N/A;    FAMILY HISTORY: Family History  Problem Relation Age of Onset  . Diabetes Mother   . Heart attack Mother   . Hypertension Father   . Thyroid disease Sister   . Sickle cell anemia Sister     ADVANCED DIRECTIVES (Y/N):  N  HEALTH MAINTENANCE: Social History   Tobacco Use  . Smoking status: Never Smoker  . Smokeless tobacco: Never Used  Substance Use Topics  . Alcohol use: Yes    Comment: socially  . Drug use: No     Colonoscopy:  PAP:  Bone density:  Lipid panel:  Allergies  Allergen Reactions  . Penicillins Anaphylaxis  . Amlodipine Besylate Itching  . Diphenhydramine-Acetaminophen Other (See Comments)    Made my breast hurt.  . Enalapril   . Sulfa Antibiotics     Reaction unknown.  . Tylenol [Acetaminophen] Other (See Comments)    Breast pain  . Codeine Itching  . Latex Rash    Current Outpatient Medications  Medication Sig Dispense Refill  . clobetasol ointment (TEMOVATE) 0.05 % APP TOPICALLY AA BID  1  . metFORMIN (GLUCOPHAGE) 500 MG tablet Take 1,000 mg by mouth 2 (two) times daily with a meal.     . spironolactone (ALDACTONE) 100 MG tablet Take 100 mg by mouth daily.     Marland Kitchen acetaminophen (TYLENOL) 325 MG tablet Take 2 tablets (650 mg total) by mouth every 6 (six) hours as needed for mild pain (or Fever >/= 101). (Patient not taking: Reported on 12/12/2017)     No current facility-administered medications for this visit.  OBJECTIVE: Vitals:   03/14/18 1451  BP: (!) 143/72  Pulse: 87  Resp: 18  Temp: 98.1 F (36.7 C)     Body mass index is 23.5 kg/m.    ECOG FS:0 - Asymptomatic  General: Well-developed, well-nourished, no acute distress. Eyes: Pink conjunctiva, anicteric sclera. Lungs: Clear to auscultation bilaterally. Heart: Regular rate and rhythm. No rubs, murmurs, or  gallops. Abdomen: Soft, nontender, reducible abdominal hernia. normoactive bowel sounds. Musculoskeletal: No edema, cyanosis, or clubbing. Neuro: Alert, answering all questions appropriately. Cranial nerves grossly intact. Skin: No rashes or petechiae noted. Psych: Normal affect.  LAB RESULTS:  Lab Results  Component Value Date   NA 140 11/23/2017   K 3.2 (L) 11/23/2017   CL 114 (H) 11/23/2017   CO2 20 (L) 11/23/2017   GLUCOSE 90 11/23/2017   BUN 6 11/23/2017   CREATININE 0.45 11/23/2017   CALCIUM 7.9 (L) 11/23/2017   PROT 6.1 (L) 11/22/2017   ALBUMIN 2.5 (L) 11/22/2017   AST 44 (H) 11/22/2017   ALT 20 11/22/2017   ALKPHOS 89 11/22/2017   BILITOT 1.9 (H) 11/22/2017   GFRNONAA >60 11/23/2017   GFRAA >60 11/23/2017    Lab Results  Component Value Date   WBC 3.0 (L) 03/14/2018   NEUTROABS 1.6 03/14/2018   HGB 10.3 (L) 03/14/2018   HCT 30.6 (L) 03/14/2018   MCV 90.0 03/14/2018   PLT 90 (L) 03/14/2018     STUDIES: No results found.  ASSESSMENT: Pancytopenia  PLAN:    1.  Pancytopenia: Multifactorial including cirrhosis and splenomegaly of greater than 17 cm causing sequestration.  Patient's counts are all decreased, but relatively stable.  She was previously noted to have antineutrophil antibodies, but these are likely a false positive.  Hemoglobin has trended down slightly, but is relatively stable.  Platelets are decreased, but unchanged.  She was also noted to have positive platelet antibodies, but these are also likely a false positive.  Will repeat neutrophil antibodies and platelet antibodies with next lab draw.  No intervention is needed at this time.  Patient does not require bone marrow biopsy.  Return to clinic in 3 months with repeat laboratory work and further evaluation.   2.  Cirrhosis: Previously, AFP is within normal limits.  Continue follow-up with GI as indicated. 3.  GI bleed: Patient's most recent EGD on November 23, 2017 revealed esophageal varices.     Approximately 30 minutes spent in discussion of which greater than 50% was consultation.   Patient expressed understanding and was in agreement with this plan. She also understands that She can call clinic at any time with any questions, concerns, or complaints.    Lloyd Huger, MD   03/18/2018 10:08 AM

## 2018-03-13 ENCOUNTER — Other Ambulatory Visit: Payer: Self-pay | Admitting: Oncology

## 2018-03-13 ENCOUNTER — Other Ambulatory Visit: Payer: Self-pay | Admitting: *Deleted

## 2018-03-13 DIAGNOSIS — D61818 Other pancytopenia: Secondary | ICD-10-CM

## 2018-03-13 NOTE — Progress Notes (Signed)
cbc

## 2018-03-14 ENCOUNTER — Other Ambulatory Visit: Payer: Self-pay

## 2018-03-14 ENCOUNTER — Inpatient Hospital Stay: Payer: Federal, State, Local not specified - PPO | Attending: Oncology

## 2018-03-14 ENCOUNTER — Inpatient Hospital Stay (HOSPITAL_BASED_OUTPATIENT_CLINIC_OR_DEPARTMENT_OTHER): Payer: Federal, State, Local not specified - PPO | Admitting: Oncology

## 2018-03-14 VITALS — BP 143/72 | HR 87 | Temp 98.1°F | Resp 18 | Wt 153.3 lb

## 2018-03-14 DIAGNOSIS — B1920 Unspecified viral hepatitis C without hepatic coma: Secondary | ICD-10-CM | POA: Diagnosis not present

## 2018-03-14 DIAGNOSIS — R161 Splenomegaly, not elsewhere classified: Secondary | ICD-10-CM

## 2018-03-14 DIAGNOSIS — Z7984 Long term (current) use of oral hypoglycemic drugs: Secondary | ICD-10-CM

## 2018-03-14 DIAGNOSIS — I10 Essential (primary) hypertension: Secondary | ICD-10-CM | POA: Diagnosis not present

## 2018-03-14 DIAGNOSIS — E119 Type 2 diabetes mellitus without complications: Secondary | ICD-10-CM | POA: Insufficient documentation

## 2018-03-14 DIAGNOSIS — I8500 Esophageal varices without bleeding: Secondary | ICD-10-CM

## 2018-03-14 DIAGNOSIS — Z8719 Personal history of other diseases of the digestive system: Secondary | ICD-10-CM

## 2018-03-14 DIAGNOSIS — K7460 Unspecified cirrhosis of liver: Secondary | ICD-10-CM | POA: Insufficient documentation

## 2018-03-14 DIAGNOSIS — Z79899 Other long term (current) drug therapy: Secondary | ICD-10-CM

## 2018-03-14 DIAGNOSIS — D61818 Other pancytopenia: Secondary | ICD-10-CM | POA: Diagnosis not present

## 2018-03-14 LAB — IRON AND TIBC
IRON: 62 ug/dL (ref 28–170)
SATURATION RATIOS: 14 % (ref 10.4–31.8)
TIBC: 455 ug/dL — ABNORMAL HIGH (ref 250–450)
UIBC: 393 ug/dL

## 2018-03-14 LAB — CBC WITH DIFFERENTIAL/PLATELET
BASOS ABS: 0 10*3/uL (ref 0–0.1)
BASOS PCT: 1 %
Eosinophils Absolute: 0.1 10*3/uL (ref 0–0.7)
Eosinophils Relative: 4 %
HEMATOCRIT: 30.6 % — AB (ref 35.0–47.0)
HEMOGLOBIN: 10.3 g/dL — AB (ref 12.0–16.0)
Lymphocytes Relative: 27 %
Lymphs Abs: 0.8 10*3/uL — ABNORMAL LOW (ref 1.0–3.6)
MCH: 30.3 pg (ref 26.0–34.0)
MCHC: 33.7 g/dL (ref 32.0–36.0)
MCV: 90 fL (ref 80.0–100.0)
MONO ABS: 0.4 10*3/uL (ref 0.2–0.9)
Monocytes Relative: 12 %
NEUTROS ABS: 1.6 10*3/uL (ref 1.4–6.5)
NEUTROS PCT: 56 %
Platelets: 90 10*3/uL — ABNORMAL LOW (ref 150–440)
RBC: 3.4 MIL/uL — AB (ref 3.80–5.20)
RDW: 18.1 % — AB (ref 11.5–14.5)
WBC: 3 10*3/uL — ABNORMAL LOW (ref 3.6–11.0)

## 2018-03-14 LAB — FERRITIN: FERRITIN: 18 ng/mL (ref 11–307)

## 2018-03-14 NOTE — Progress Notes (Signed)
Here for follow up. Stated feeling good. Med changes noted. Lost weight and watching her NA . Doing well she stated

## 2018-03-17 LAB — DIRECT PLATELET ANTIBODY
PLT ASSOC. ANTI-IB/IX: POSITIVE — AB
PLT ASSOC. ANTI-IIB/IIIA: POSITIVE — AB
Plt Assoc. Anti-IA/IIA: POSITIVE — AB

## 2018-03-21 LAB — NEUTROPHIL AB TEST LEVEL 1: NEUTROPHIL SCR/PANEL INTERP.: POSITIVE — AB

## 2018-05-17 DIAGNOSIS — E119 Type 2 diabetes mellitus without complications: Secondary | ICD-10-CM | POA: Insufficient documentation

## 2018-05-29 DIAGNOSIS — R002 Palpitations: Secondary | ICD-10-CM | POA: Insufficient documentation

## 2018-05-29 DIAGNOSIS — I351 Nonrheumatic aortic (valve) insufficiency: Secondary | ICD-10-CM | POA: Insufficient documentation

## 2018-05-29 DIAGNOSIS — R42 Dizziness and giddiness: Secondary | ICD-10-CM | POA: Insufficient documentation

## 2018-05-29 DIAGNOSIS — E782 Mixed hyperlipidemia: Secondary | ICD-10-CM | POA: Insufficient documentation

## 2018-06-12 DIAGNOSIS — I8500 Esophageal varices without bleeding: Secondary | ICD-10-CM | POA: Insufficient documentation

## 2018-06-13 DIAGNOSIS — I493 Ventricular premature depolarization: Secondary | ICD-10-CM | POA: Insufficient documentation

## 2018-06-14 ENCOUNTER — Other Ambulatory Visit: Payer: Self-pay | Admitting: Internal Medicine

## 2018-06-14 DIAGNOSIS — K7031 Alcoholic cirrhosis of liver with ascites: Secondary | ICD-10-CM

## 2018-06-14 DIAGNOSIS — K766 Portal hypertension: Secondary | ICD-10-CM

## 2018-06-17 NOTE — Progress Notes (Deleted)
[place]  [phone] Fax:(336) [phone]  ID: [name] OB: 1955/11/27  MR#: [id number]  CSN#:[id number]  Patient Care Team: Maryland Pink, MD as PCP - General (Family Medicine)  CHIEF COMPLAINT: Pancytopenia  INTERVAL HISTORY: Patient returns to clinic today for further evaluation and repeat laboratory work.  She currently feels well and is asymptomatic. She has no neurologic complaints.  She denies any recent fevers or illnesses.  She has a good appetite and denies weight loss.  She denies any chest pain, cough, hemoptysis, or shortness of breath.  She denies any abdominal pain. She denies any nausea, vomiting, constipation, or diarrhea.  She has no melena or hematochezia. She has no urinary complaints.    Patient feels at her baseline offers no specific complaints today.  REVIEW OF SYSTEMS:   Review of Systems  Constitutional: Negative.  Negative for fever, malaise/fatigue and weight loss.  Respiratory: Negative.  Negative for cough, hemoptysis and shortness of breath.   Cardiovascular: Negative.  Negative for chest pain and leg swelling.  Gastrointestinal: Negative.  Negative for abdominal pain, blood in stool and melena.  Genitourinary: Negative.  Negative for dysuria and hematuria.  Musculoskeletal: Negative.  Negative for back pain.  Skin: Negative.  Negative for rash.  Neurological: Negative.  Negative for sensory change, focal weakness and weakness.  Psychiatric/Behavioral: Negative.  The patient is not nervous/anxious.     As per HPI. Otherwise, a complete review of systems is negative.  PAST MEDICAL HISTORY: Past Medical History:  Diagnosis Date  . Diabetes mellitus without complication ([place])   . Hepatitis C   . Hypertension     PAST SURGICAL HISTORY: Past Surgical History:  Procedure Laterality Date  . ABDOMINAL HYSTERECTOMY    . ESOPHAGOGASTRODUODENOSCOPY (EGD) WITH PROPOFOL N/A 11/23/2017   Procedure:  ESOPHAGOGASTRODUODENOSCOPY (EGD) WITH PROPOFOL;  Surgeon: Toledo, Benay Pike, MD;  Location: [facility] ENDOSCOPY;  Service: Gastroenterology;  Laterality: N/A;    FAMILY HISTORY: Family History  Problem Relation Age of Onset  . Diabetes Mother   . Heart attack Mother   . Hypertension Father   . Thyroid disease Sister   . Sickle cell anemia Sister     ADVANCED DIRECTIVES (Y/N):  N  HEALTH MAINTENANCE: Social History   Tobacco Use  . Smoking status: Never Smoker  . Smokeless tobacco: Never Used  Substance Use Topics  . Alcohol use: Yes    Comment: socially  . Drug use: No     Colonoscopy:  PAP:  Bone density:  Lipid panel:  Allergies  Allergen Reactions  . Penicillins Anaphylaxis  . Amlodipine Besylate Itching  . Diphenhydramine-Acetaminophen Other (See Comments)    Made my breast hurt.  . Enalapril   . Sulfa Antibiotics     Reaction unknown.  . Tylenol [Acetaminophen] Other (See Comments)    Breast pain  . Codeine Itching  . Latex Rash    Current Outpatient Medications  Medication Sig Dispense Refill  . acetaminophen (TYLENOL) 325 MG tablet Take 2 tablets (650 mg total) by mouth every 6 (six) hours as needed for mild pain (or Fever >/= 101). (Patient not taking: Reported on 12/12/2017)    . clobetasol ointment (TEMOVATE) 0.05 % APP TOPICALLY AA BID  1  . metFORMIN (GLUCOPHAGE) 500 MG tablet Take 1,000 mg by mouth 2 (two) times daily with a meal.     . spironolactone (ALDACTONE) 100 MG tablet Take 100 mg by mouth daily.      No current facility-administered medications for this visit.  OBJECTIVE: There were no vitals filed for this visit.   There is no height or weight on file to calculate BMI.    ECOG FS:0 - Asymptomatic  General: Well-developed, well-nourished, no acute distress. Eyes: Pink conjunctiva, anicteric sclera. Lungs: Clear to auscultation bilaterally. Heart: Regular rate and rhythm. No rubs, murmurs, or gallops. Abdomen: Soft, nontender,  reducible abdominal hernia. normoactive bowel sounds. Musculoskeletal: No edema, cyanosis, or clubbing. Neuro: Alert, answering all questions appropriately. Cranial nerves grossly intact. Skin: No rashes or petechiae noted. Psych: Normal affect.  LAB RESULTS:  Lab Results  Component Value Date   NA 140 11/23/2017   K 3.2 (L) 11/23/2017   CL 114 (H) 11/23/2017   CO2 20 (L) 11/23/2017   GLUCOSE 90 11/23/2017   BUN 6 11/23/2017   CREATININE 0.45 11/23/2017   CALCIUM 7.9 (L) 11/23/2017   PROT 6.1 (L) 11/22/2017   ALBUMIN 2.5 (L) 11/22/2017   AST 44 (H) 11/22/2017   ALT 20 11/22/2017   ALKPHOS 89 11/22/2017   BILITOT 1.9 (H) 11/22/2017   GFRNONAA >60 11/23/2017   GFRAA >60 11/23/2017    Lab Results  Component Value Date   WBC 3.0 (L) 03/14/2018   NEUTROABS 1.6 03/14/2018   HGB 10.3 (L) 03/14/2018   HCT 30.6 (L) 03/14/2018   MCV 90.0 03/14/2018   PLT 90 (L) 03/14/2018     STUDIES: No results found.  ASSESSMENT: Pancytopenia  PLAN:    1.  Pancytopenia: Multifactorial including cirrhosis and splenomegaly of greater than 17 cm causing sequestration.  Patient's counts are all decreased, but relatively stable.  She was previously noted to have antineutrophil antibodies, but these are likely a false positive.  Hemoglobin has trended down slightly, but is relatively stable.  Platelets are decreased, but unchanged.  She was also noted to have positive platelet antibodies, but these are also likely a false positive.  Will repeat neutrophil antibodies and platelet antibodies with next lab draw.  No intervention is needed at this time.  Patient does not require bone marrow biopsy.  Return to clinic in 3 months with repeat laboratory work and further evaluation.   2.  Cirrhosis: Previously, AFP is within normal limits.  Continue follow-up with GI as indicated. 3.  GI bleed: Patient's most recent EGD on November 23, 2017 revealed esophageal varices.    Approximately 30 minutes spent in  discussion of which greater than 50% was consultation.   Patient expressed understanding and was in agreement with this plan. She also understands that She can call clinic at any time with any questions, concerns, or complaints.    Lloyd Huger, MD   06/17/2018 11:08 AM

## 2018-06-20 ENCOUNTER — Inpatient Hospital Stay: Payer: Federal, State, Local not specified - PPO

## 2018-06-20 ENCOUNTER — Inpatient Hospital Stay: Payer: Federal, State, Local not specified - PPO | Admitting: Oncology

## 2018-06-29 ENCOUNTER — Ambulatory Visit: Admission: RE | Admit: 2018-06-29 | Payer: Federal, State, Local not specified - PPO | Source: Ambulatory Visit

## 2018-07-02 NOTE — Progress Notes (Signed)
[place]  [phone] Fax:(336) [phone]  ID: Roberta Lane OB: 11/09/1955  MR#: [id number]  CSN#:[id number]  Patient Care Team: Maryland Pink, MD as PCP - General (Family Medicine)  CHIEF COMPLAINT: Pancytopenia  INTERVAL HISTORY: Patient returns to clinic today for further evaluation and repeat laboratory work.  She continues to feel well and at her baseline.  She is not complain of any weakness or fatigue.  She has no neurologic complaints.  She denies any recent fevers or illnesses.  She has a good appetite and denies weight loss.  She denies any chest pain, cough, hemoptysis, or shortness of breath.  She denies any abdominal pain. She denies any nausea, vomiting, constipation, or diarrhea.  She has no melena or hematochezia. She has no urinary complaints.    Patient offers no specific complaints today.  REVIEW OF SYSTEMS:   Review of Systems  Constitutional: Negative.  Negative for fever, malaise/fatigue and weight loss.  Respiratory: Negative.  Negative for cough, hemoptysis and shortness of breath.   Cardiovascular: Negative.  Negative for chest pain and leg swelling.  Gastrointestinal: Negative.  Negative for abdominal pain, blood in stool and melena.  Genitourinary: Negative.  Negative for dysuria and hematuria.  Musculoskeletal: Negative.  Negative for back pain.  Skin: Negative.  Negative for rash.  Neurological: Negative.  Negative for sensory change, focal weakness and weakness.  Psychiatric/Behavioral: Negative.  The patient is not nervous/anxious.     As per HPI. Otherwise, a complete review of systems is negative.  PAST MEDICAL HISTORY: Past Medical History:  Diagnosis Date  . Diabetes mellitus without complication ([place])   . Hepatitis C   . Hypertension     PAST SURGICAL HISTORY: Past Surgical History:  Procedure Laterality Date  . ABDOMINAL HYSTERECTOMY    . ESOPHAGOGASTRODUODENOSCOPY (EGD) WITH PROPOFOL N/A 11/23/2017   Procedure: ESOPHAGOGASTRODUODENOSCOPY (EGD) WITH PROPOFOL;  Surgeon: Toledo, Benay Pike, MD;  Location: [facility] ENDOSCOPY;  Service: Gastroenterology;  Laterality: N/A;    FAMILY HISTORY: Family History  Problem Relation Age of Onset  . Diabetes Mother   . Heart attack Mother   . Hypertension Father   . Thyroid disease Sister   . Sickle cell anemia Sister     ADVANCED DIRECTIVES (Y/N):  N  HEALTH MAINTENANCE: Social History   Tobacco Use  . Smoking status: Never Smoker  . Smokeless tobacco: Never Used  Substance Use Topics  . Alcohol use: Yes    Comment: socially  . Drug use: No     Colonoscopy:  PAP:  Bone density:  Lipid panel:  Allergies  Allergen Reactions  . Penicillins Anaphylaxis  . Amlodipine Besylate Itching  . Diphenhydramine-Acetaminophen Other (See Comments)    Made my breast hurt.  . Enalapril   . Sulfa Antibiotics     Reaction unknown.  . Tylenol [Acetaminophen] Other (See Comments)    Breast pain  . Codeine Itching  . Latex Rash    Current Outpatient Medications  Medication Sig Dispense Refill  . clobetasol ointment (TEMOVATE) 0.05 % APP TOPICALLY AA BID  1  . furosemide (LASIX) 40 MG tablet Take by mouth.    . metFORMIN (GLUCOPHAGE) 500 MG tablet Take 1,000 mg by mouth 2 (two) times daily with a meal.     . spironolactone (ALDACTONE) 100 MG tablet Take 100 mg by mouth daily.     Marland Kitchen acetaminophen (TYLENOL) 325 MG tablet Take 2 tablets (650 mg total) by mouth every 6 (six) hours as needed for mild pain (or  Fever >/= 101). (Patient not taking: Reported on 12/12/2017)     No current facility-administered medications for this visit.     OBJECTIVE: Vitals:   07/04/18 1454  BP: 128/76  Pulse: 78  Resp: 18  Temp: 97.6 F (36.4 C)     Body mass index is 25.03 kg/m.    ECOG FS:0 - Asymptomatic  General: Well-developed, well-nourished, no acute distress. Eyes: Pink conjunctiva, anicteric sclera. HEENT: Normocephalic, moist mucous  membranes. Lungs: Clear to auscultation bilaterally. Heart: Regular rate and rhythm. No rubs, murmurs, or gallops. Abdomen: Soft, nontender, nondistended. No organomegaly noted, normoactive bowel sounds. Musculoskeletal: No edema, cyanosis, or clubbing. Neuro: Alert, answering all questions appropriately. Cranial nerves grossly intact. Skin: No rashes or petechiae noted. Psych: Normal affect.   LAB RESULTS:  Lab Results  Component Value Date   NA 140 11/23/2017   K 3.2 (L) 11/23/2017   CL 114 (H) 11/23/2017   CO2 20 (L) 11/23/2017   GLUCOSE 90 11/23/2017   BUN 6 11/23/2017   CREATININE 0.45 11/23/2017   CALCIUM 7.9 (L) 11/23/2017   PROT 6.1 (L) 11/22/2017   ALBUMIN 2.5 (L) 11/22/2017   AST 44 (H) 11/22/2017   ALT 20 11/22/2017   ALKPHOS 89 11/22/2017   BILITOT 1.9 (H) 11/22/2017   GFRNONAA >60 11/23/2017   GFRAA >60 11/23/2017    Lab Results  Component Value Date   WBC 3.3 (L) 07/04/2018   NEUTROABS 1.8 07/04/2018   HGB 9.9 (L) 07/04/2018   HCT 30.3 (L) 07/04/2018   MCV 81.6 07/04/2018   PLT 80 (L) 07/04/2018     STUDIES: No results found.  ASSESSMENT: Pancytopenia  PLAN:    1.  Pancytopenia: Multifactorial including cirrhosis and splenomegaly of greater than 17 cm noted on CT scan on November 21, 2017 causing sequestration. Patient's counts are all decreased, but relatively stable.  She was previously noted to have antineutrophil antibodies, but these are likely a false positive. Platelets are also decreased, but unchanged.  She was also noted to have positive platelet antibodies, but these are also likely a false positive.  We will repeat neutrophil antibodies and platelet antibodies in 3 months with next lab draw.  No intervention is needed at this time.  Patient does not require bone marrow biopsy.  No intervention is needed at this time.  Patient does not require bone marrow biopsy.  Return to clinic in 3 months with repeat laboratory work only and then in 6  months with repeat laboratory work and further evaluation.   2.  Cirrhosis: Previously, AFP is within normal limits.  Continue follow-up with GI as indicated. 3.  GI bleed: Patient denies any recent bleeding.  Patient's most recent EGD on November 23, 2017 revealed esophageal varices.    I spent a total of 20 minutes face-to-face with the patient of which greater than 50% of the visit was spent in counseling and coordination of care as detailed above.   Patient expressed understanding and was in agreement with this plan. She also understands that She can call clinic at any time with any questions, concerns, or complaints.    Lloyd Huger, MD   07/06/2018 7:39 PM

## 2018-07-04 ENCOUNTER — Encounter: Payer: Self-pay | Admitting: Oncology

## 2018-07-04 ENCOUNTER — Inpatient Hospital Stay (HOSPITAL_BASED_OUTPATIENT_CLINIC_OR_DEPARTMENT_OTHER): Payer: Federal, State, Local not specified - PPO | Admitting: Oncology

## 2018-07-04 ENCOUNTER — Inpatient Hospital Stay: Payer: Federal, State, Local not specified - PPO | Attending: Oncology

## 2018-07-04 VITALS — BP 128/76 | HR 78 | Temp 97.6°F | Resp 18 | Wt 163.2 lb

## 2018-07-04 DIAGNOSIS — K7460 Unspecified cirrhosis of liver: Secondary | ICD-10-CM | POA: Diagnosis not present

## 2018-07-04 DIAGNOSIS — I8500 Esophageal varices without bleeding: Secondary | ICD-10-CM

## 2018-07-04 DIAGNOSIS — R161 Splenomegaly, not elsewhere classified: Secondary | ICD-10-CM

## 2018-07-04 DIAGNOSIS — D61818 Other pancytopenia: Secondary | ICD-10-CM | POA: Insufficient documentation

## 2018-07-04 LAB — CBC WITH DIFFERENTIAL/PLATELET
BASOS ABS: 0 10*3/uL (ref 0–0.1)
BASOS PCT: 1 %
EOS PCT: 11 %
Eosinophils Absolute: 0.4 10*3/uL (ref 0–0.7)
HEMATOCRIT: 30.3 % — AB (ref 35.0–47.0)
Hemoglobin: 9.9 g/dL — ABNORMAL LOW (ref 12.0–16.0)
LYMPHS PCT: 21 %
Lymphs Abs: 0.7 10*3/uL — ABNORMAL LOW (ref 1.0–3.6)
MCH: 26.5 pg (ref 26.0–34.0)
MCHC: 32.5 g/dL (ref 32.0–36.0)
MCV: 81.6 fL (ref 80.0–100.0)
Monocytes Absolute: 0.4 10*3/uL (ref 0.2–0.9)
Monocytes Relative: 12 %
NEUTROS ABS: 1.8 10*3/uL (ref 1.4–6.5)
Neutrophils Relative %: 55 %
PLATELETS: 80 10*3/uL — AB (ref 150–440)
RBC: 3.71 MIL/uL — AB (ref 3.80–5.20)
RDW: 19.4 % — AB (ref 11.5–14.5)
WBC: 3.3 10*3/uL — AB (ref 3.6–11.0)

## 2018-07-04 NOTE — Progress Notes (Signed)
Pt in for follow up, reports no concerns today.

## 2018-07-09 ENCOUNTER — Ambulatory Visit
Admission: RE | Admit: 2018-07-09 | Discharge: 2018-07-09 | Disposition: A | Discharge status: Home / Self Care | Payer: Federal, State, Local not specified - PPO | Source: Ambulatory Visit | Attending: Internal Medicine | Admitting: Internal Medicine

## 2018-07-09 DIAGNOSIS — Z9049 Acquired absence of other specified parts of digestive tract: Secondary | ICD-10-CM | POA: Diagnosis not present

## 2018-07-09 DIAGNOSIS — K7031 Alcoholic cirrhosis of liver with ascites: Secondary | ICD-10-CM | POA: Diagnosis present

## 2018-07-09 DIAGNOSIS — K766 Portal hypertension: Secondary | ICD-10-CM | POA: Diagnosis present

## 2018-07-09 IMAGING — US US ABDOMEN LIMITED
1 series · 14 of 25 positions shown · non-contrast
Comparison: Abdominal and pelvic CT scan [DATE].

CLINICAL DATA: History of alcoholic cirrhosis, portal hypertension,
previous cholecystectomy.

EXAM:
ULTRASOUND ABDOMEN LIMITED RIGHT UPPER QUADRANT

[Series 1: us abdomen limited · 14 of 48 slices shown]
[im 1/48]
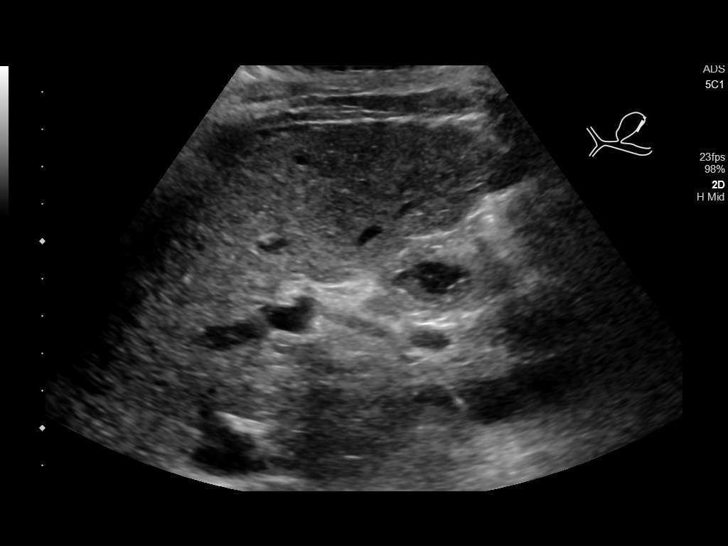
[im 4/48]
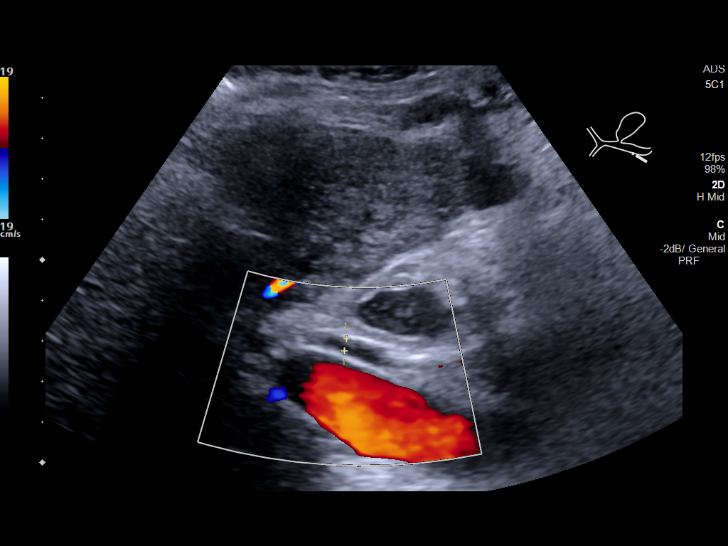
[im 8/48]
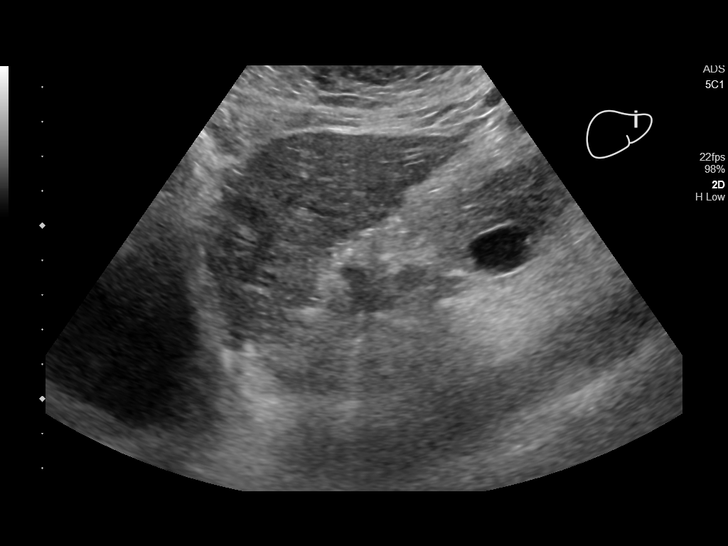
[im 12/48]
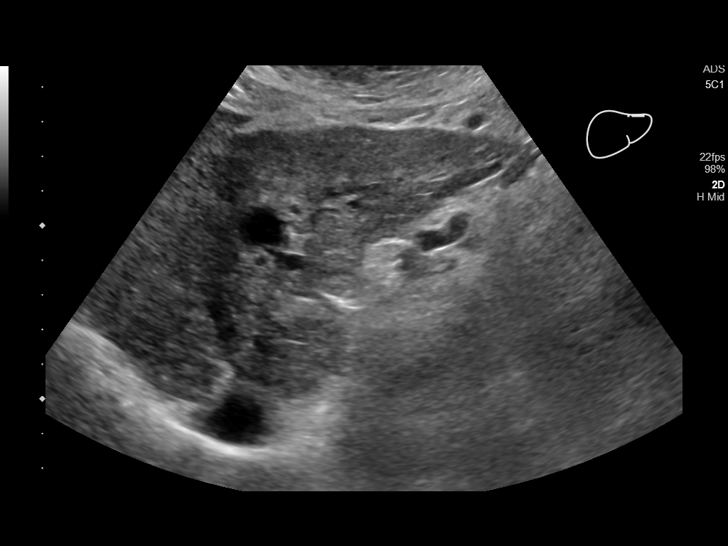
[im 16/48]
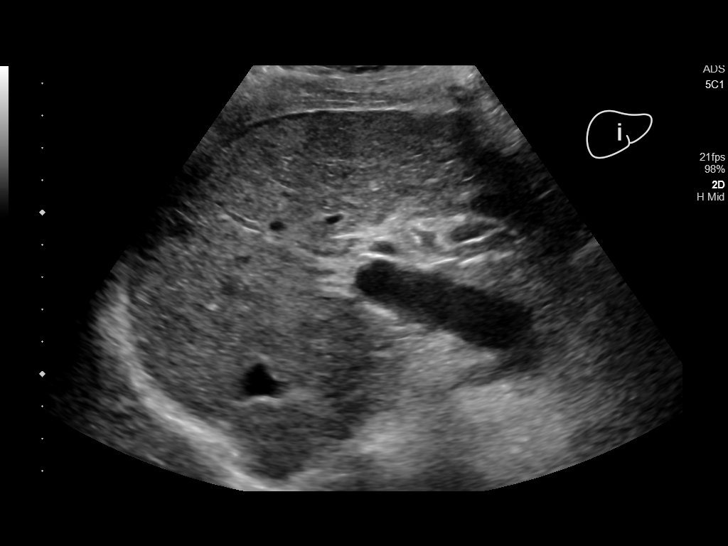
[im 18/48]
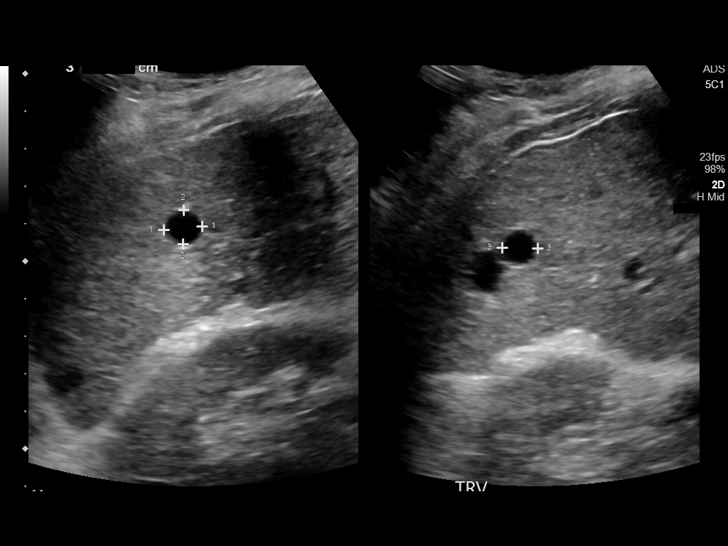
[im 22/48]
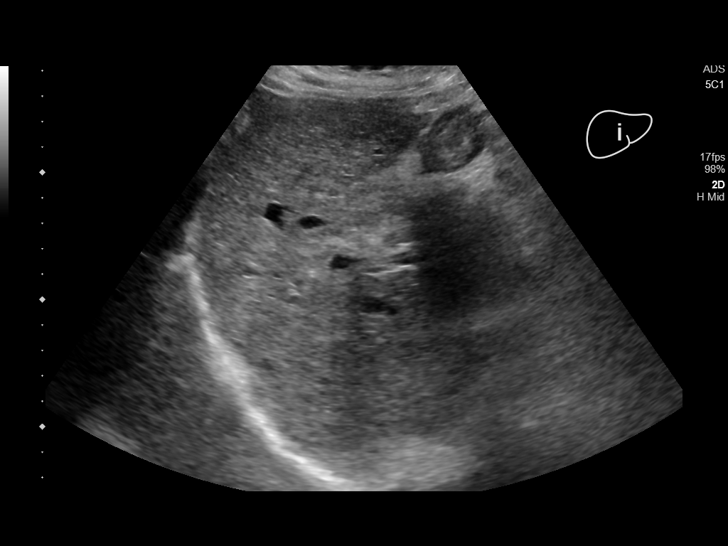
[im 26/48]
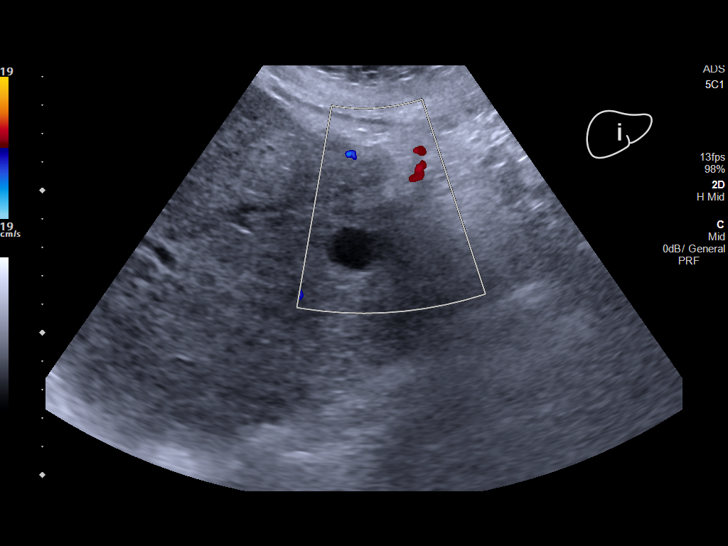
[im 30/48]
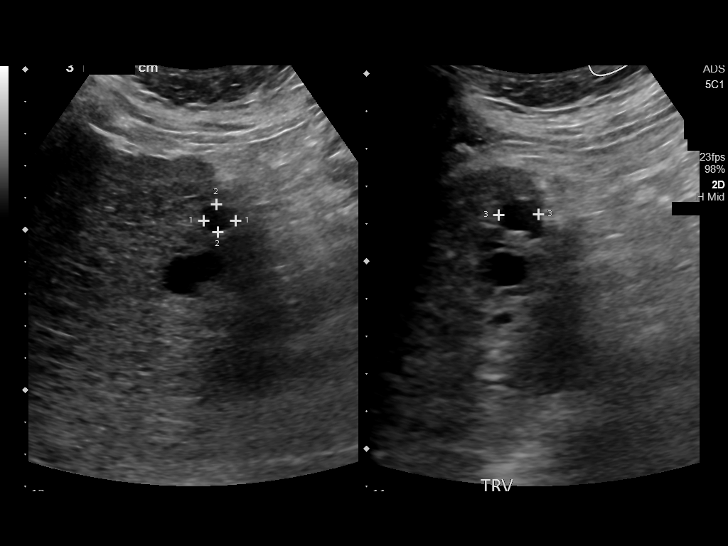
[im 32/48]
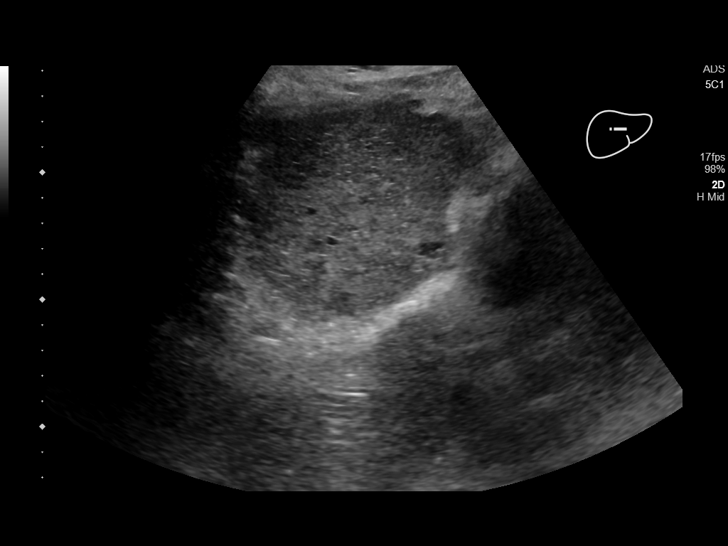
[im 36/48]
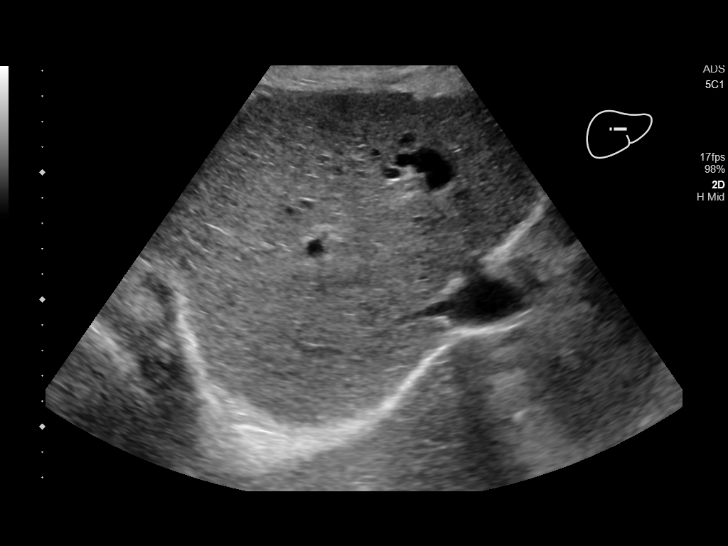
[im 40/48]
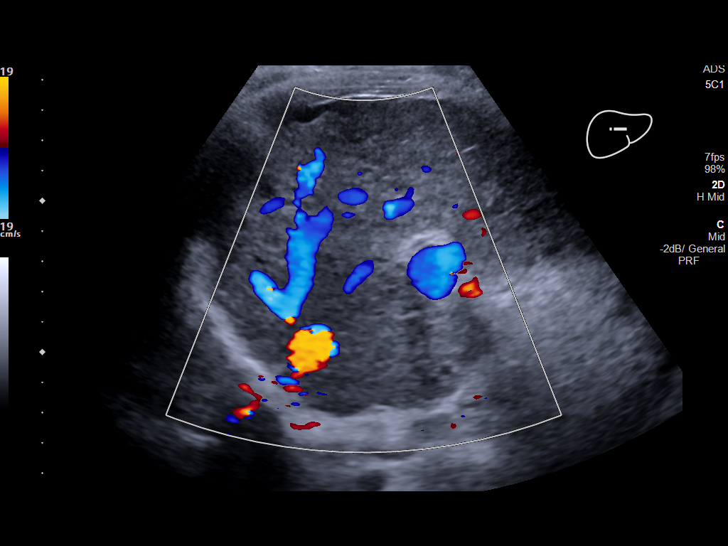
[im 44/48]
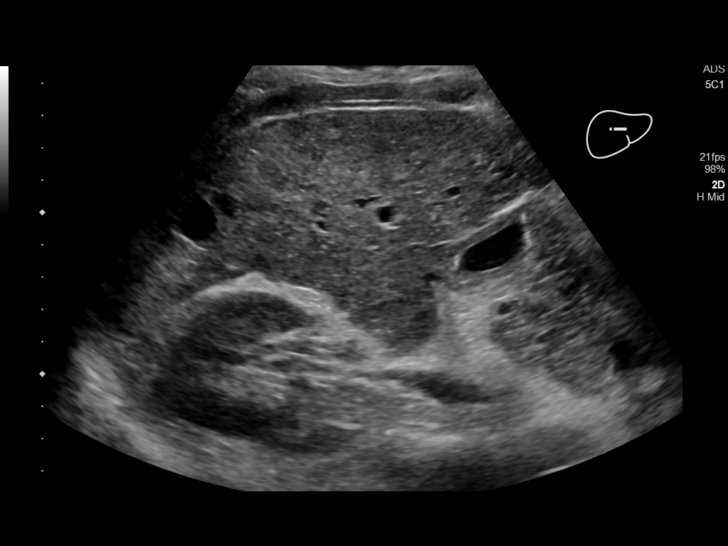
[im 48/48]
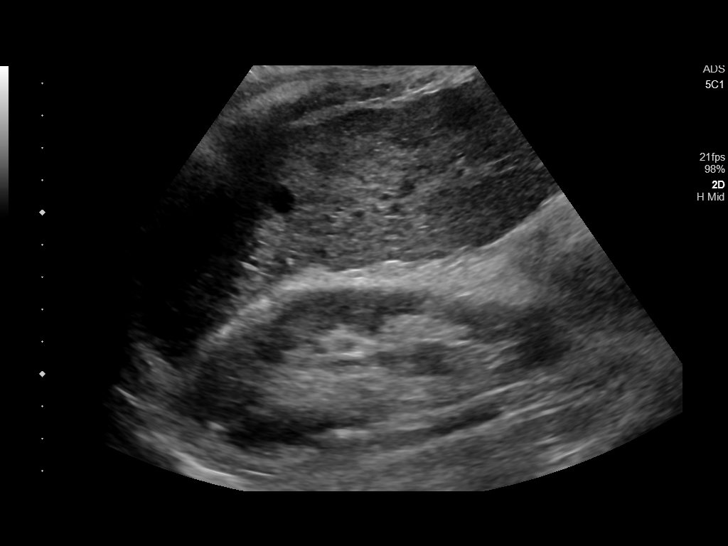

[14 of 25 positions shown; findings below may reference images not displayed]

FINDINGS: Gallbladder:

The gallbladder is surgically absent.

Common bile duct:

Diameter: 3 mm

Liver:

The hepatic echotexture is heterogeneously increased. The surface
contour is irregular. There are multiple hepatic cysts which appear
to be simple in nature. The largest measures 2.1 x 1.6 x 1.6 cm.
There is no discrete solid appearing mass. There is no intrahepatic
ductal dilation. Portal vein is patent on color Doppler imaging with
normal direction of blood flow towards the liver.

There is ascites surrounding the liver.
IMPRESSION: Cirrhotic changes within the liver. No suspicious masses. Ascites is
present surrounding the liver.

Normal common bile duct.  Previous cholecystectomy.

## 2018-07-20 ENCOUNTER — Other Ambulatory Visit: Payer: Self-pay

## 2018-07-20 ENCOUNTER — Observation Stay
Admission: EM | Admit: 2018-07-20 | Discharge: 2018-07-21 | Disposition: A | Discharge status: Home / Self Care | Payer: Federal, State, Local not specified - PPO | Attending: Internal Medicine | Admitting: Internal Medicine

## 2018-07-20 ENCOUNTER — Encounter: Payer: Self-pay | Admitting: Emergency Medicine

## 2018-07-20 DIAGNOSIS — Z91040 Latex allergy status: Secondary | ICD-10-CM | POA: Diagnosis not present

## 2018-07-20 DIAGNOSIS — Z880 Allergy status to penicillin: Secondary | ICD-10-CM | POA: Insufficient documentation

## 2018-07-20 DIAGNOSIS — Z9049 Acquired absence of other specified parts of digestive tract: Secondary | ICD-10-CM | POA: Insufficient documentation

## 2018-07-20 DIAGNOSIS — Z886 Allergy status to analgesic agent status: Secondary | ICD-10-CM | POA: Diagnosis not present

## 2018-07-20 DIAGNOSIS — R410 Disorientation, unspecified: Secondary | ICD-10-CM | POA: Diagnosis present

## 2018-07-20 DIAGNOSIS — K7031 Alcoholic cirrhosis of liver with ascites: Secondary | ICD-10-CM | POA: Diagnosis not present

## 2018-07-20 DIAGNOSIS — Z833 Family history of diabetes mellitus: Secondary | ICD-10-CM | POA: Diagnosis not present

## 2018-07-20 DIAGNOSIS — Z79899 Other long term (current) drug therapy: Secondary | ICD-10-CM | POA: Diagnosis not present

## 2018-07-20 DIAGNOSIS — K7290 Hepatic failure, unspecified without coma: Principal | ICD-10-CM | POA: Insufficient documentation

## 2018-07-20 DIAGNOSIS — Z882 Allergy status to sulfonamides status: Secondary | ICD-10-CM | POA: Diagnosis not present

## 2018-07-20 DIAGNOSIS — Z888 Allergy status to other drugs, medicaments and biological substances status: Secondary | ICD-10-CM | POA: Diagnosis not present

## 2018-07-20 DIAGNOSIS — Z7984 Long term (current) use of oral hypoglycemic drugs: Secondary | ICD-10-CM | POA: Diagnosis not present

## 2018-07-20 DIAGNOSIS — K766 Portal hypertension: Secondary | ICD-10-CM | POA: Diagnosis not present

## 2018-07-20 DIAGNOSIS — E119 Type 2 diabetes mellitus without complications: Secondary | ICD-10-CM | POA: Diagnosis not present

## 2018-07-20 DIAGNOSIS — E7220 Disorder of urea cycle metabolism, unspecified: Secondary | ICD-10-CM | POA: Diagnosis present

## 2018-07-20 DIAGNOSIS — Z90710 Acquired absence of both cervix and uterus: Secondary | ICD-10-CM | POA: Insufficient documentation

## 2018-07-20 DIAGNOSIS — D61818 Other pancytopenia: Secondary | ICD-10-CM | POA: Diagnosis not present

## 2018-07-20 DIAGNOSIS — Z8349 Family history of other endocrine, nutritional and metabolic diseases: Secondary | ICD-10-CM | POA: Insufficient documentation

## 2018-07-20 DIAGNOSIS — Z8619 Personal history of other infectious and parasitic diseases: Secondary | ICD-10-CM | POA: Diagnosis not present

## 2018-07-20 DIAGNOSIS — Z832 Family history of diseases of the blood and blood-forming organs and certain disorders involving the immune mechanism: Secondary | ICD-10-CM | POA: Diagnosis not present

## 2018-07-20 DIAGNOSIS — Z8249 Family history of ischemic heart disease and other diseases of the circulatory system: Secondary | ICD-10-CM | POA: Diagnosis not present

## 2018-07-20 DIAGNOSIS — K7682 Hepatic encephalopathy: Secondary | ICD-10-CM | POA: Diagnosis present

## 2018-07-20 DIAGNOSIS — Z885 Allergy status to narcotic agent status: Secondary | ICD-10-CM | POA: Insufficient documentation

## 2018-07-20 LAB — URINALYSIS, COMPLETE (UACMP) WITH MICROSCOPIC
Bilirubin Urine: NEGATIVE
Glucose, UA: NEGATIVE mg/dL
HGB URINE DIPSTICK: NEGATIVE
Ketones, ur: NEGATIVE mg/dL
Leukocytes, UA: NEGATIVE
NITRITE: NEGATIVE
PROTEIN: NEGATIVE mg/dL
SPECIFIC GRAVITY, URINE: 1.014 (ref 1.005–1.030)
Squamous Epithelial / LPF: NONE SEEN (ref 0–5)
pH: 5 (ref 5.0–8.0)

## 2018-07-20 LAB — COMPREHENSIVE METABOLIC PANEL
ALT: 27 U/L (ref 0–44)
AST: 48 U/L — AB (ref 15–41)
Albumin: 3.6 g/dL (ref 3.5–5.0)
Alkaline Phosphatase: 91 U/L (ref 38–126)
Anion gap: 11 (ref 5–15)
BUN: 9 mg/dL (ref 8–23)
CHLORIDE: 109 mmol/L (ref 98–111)
CO2: 17 mmol/L — AB (ref 22–32)
CREATININE: 0.7 mg/dL (ref 0.44–1.00)
Calcium: 9.4 mg/dL (ref 8.9–10.3)
GFR calc Af Amer: >60 mL/min (ref >60)
GLUCOSE: 118 mg/dL — AB (ref 70–99)
POTASSIUM: 4.1 mmol/L (ref 3.5–5.1)
SODIUM: 137 mmol/L (ref 135–145)
Total Bilirubin: 2.5 mg/dL — ABNORMAL HIGH (ref 0.3–1.2)
Total Protein: 7.9 g/dL (ref 6.5–8.1)

## 2018-07-20 LAB — AMMONIA: Ammonia: 89 umol/L — ABNORMAL HIGH (ref 9–35)

## 2018-07-20 LAB — CBC
HEMATOCRIT: 33.2 % — AB (ref 35.0–47.0)
Hemoglobin: 11.1 g/dL — ABNORMAL LOW (ref 12.0–16.0)
MCH: 27 pg (ref 26.0–34.0)
MCHC: 33.4 g/dL (ref 32.0–36.0)
MCV: 80.9 fL (ref 80.0–100.0)
PLATELETS: 90 10*3/uL — AB (ref 150–440)
RBC: 4.1 MIL/uL (ref 3.80–5.20)
RDW: 19.2 % — AB (ref 11.5–14.5)
WBC: 2.9 10*3/uL — ABNORMAL LOW (ref 3.6–11.0)

## 2018-07-20 LAB — GLUCOSE, CAPILLARY: Glucose-Capillary: 152 mg/dL — ABNORMAL HIGH (ref 70–99)

## 2018-07-20 MED ORDER — LACTULOSE 10 GM/15ML PO SOLN
30.0000 g | Freq: Once | ORAL | Status: AC
  Administered 2018-07-20: 30 g via ORAL
  Filled 2018-07-20 (×2): qty 60

## 2018-07-20 MED ORDER — ONDANSETRON HCL 4 MG PO TABS: 4.0000 mg | ORAL_TABLET | Freq: Four times a day (QID) | ORAL | Status: DC | PRN

## 2018-07-20 MED ORDER — HEPARIN SODIUM (PORCINE) 5000 UNIT/ML IJ SOLN
5000.0000 [IU] | Freq: Three times a day (TID) | INTRAMUSCULAR | Status: DC
  Administered 2018-07-21: 01:00:00 5000 [IU] via SUBCUTANEOUS
  Filled 2018-07-20 (×2): qty 1

## 2018-07-20 MED ORDER — METFORMIN HCL 500 MG PO TABS
1000.0000 mg | ORAL_TABLET | Freq: Two times a day (BID) | ORAL | Status: DC
  Administered 2018-07-21: 10:00:00 1000 mg via ORAL
  Filled 2018-07-20 (×4): qty 2

## 2018-07-20 MED ORDER — LACTULOSE 10 GM/15ML PO SOLN
30.0000 g | Freq: Two times a day (BID) | ORAL | Status: DC
  Administered 2018-07-21 (×2): 30 g via ORAL
  Filled 2018-07-20 (×2): qty 60

## 2018-07-20 MED ORDER — TRAZODONE HCL 50 MG PO TABS: 25.0000 mg | ORAL_TABLET | Freq: Every evening | ORAL | Status: DC | PRN

## 2018-07-20 MED ORDER — SODIUM CHLORIDE 0.9 % IV BOLUS
500.0000 mL | Freq: Once | INTRAVENOUS | Status: AC
  Administered 2018-07-20: 500 mL via INTRAVENOUS

## 2018-07-20 MED ORDER — SPIRONOLACTONE 100 MG PO TABS
100.0000 mg | ORAL_TABLET | Freq: Every day | ORAL | Status: DC
  Administered 2018-07-21: 100 mg via ORAL
  Filled 2018-07-20 (×2): qty 1
  Filled 2018-07-20: qty 4

## 2018-07-20 MED ORDER — ONDANSETRON HCL 4 MG/2ML IJ SOLN: 4.0000 mg | Freq: Four times a day (QID) | INTRAMUSCULAR | Status: DC | PRN

## 2018-07-20 NOTE — ED Notes (Signed)
This RN brought patient's neighbors who were in the lobby back to be with patient and explained plan of care per patient's request.

## 2018-07-20 NOTE — ED Triage Notes (Signed)
Pt comes into the ED via POV c/o dizziness and more confusion than normal.  Patient appears to be a good historian at this time, but states that she has issues from hep C and vertigo and her MD wanted her evaluated for possible liver problems.  Patient has no jaundice at this time and is alert and oriented.  Patient in NAD with even and unlabored respirations.  Patient said she had liver enzymes checked a week ago and they were WDL.

## 2018-07-20 NOTE — ED Provider Notes (Signed)
[facility] Emergency Department Provider Note  ____________________________________________  Time seen: Approximately 5:04 PM  I have reviewed the triage vital signs and the nursing notes.   HISTORY  Chief Complaint Dizziness and Altered Mental Status    HPI Roberta Lane is a 62 y.o. female with a history of HTN, DM, hepatitis C presenting for altered mental status.  The patient reports that she went to see her primary care physician because she has had lightheadedness with standing for the past 2 days.  They are, it was noted that she was confused and was unable to state the date, which is unusual for her she is usually alert and oriented x4.  The patient has noted some confusion.  She denies any nausea vomiting or diarrhea, fevers or chills, abdominal pain.  She is followed by Dr. Alice Reichert, her GI physician, who recently placed her on a diuretic which gave her urticaria; she did take several doses of Benadryl several days ago for this; she does not remember which medication she was placed on.  She denies any history of hyperammonemia.  Past Medical History:  Diagnosis Date  . Diabetes mellitus without complication ([place])   . Hepatitis C   . Hypertension     Patient Active Problem List   Diagnosis Date Noted  . Pancytopenia ([place]) 12/10/2017  . GIB (gastrointestinal bleeding) 11/21/2017    Past Surgical History:  Procedure Laterality Date  . ABDOMINAL HYSTERECTOMY    . ESOPHAGOGASTRODUODENOSCOPY (EGD) WITH PROPOFOL N/A 11/23/2017   Procedure: ESOPHAGOGASTRODUODENOSCOPY (EGD) WITH PROPOFOL;  Surgeon: Toledo, Benay Pike, MD;  Location: [facility] ENDOSCOPY;  Service: Gastroenterology;  Laterality: N/A;    Current Outpatient Rx  . Order #: [id number] Class: No Print  . Order #: [id number] Class: Historical Med  . Order #: [id number] Class: Historical Med  . Order #: [id number] Class: Historical Med  . Order #: [id number] Class: Historical Med     Allergies Penicillins; Amlodipine besylate; Diphenhydramine-acetaminophen; Enalapril; Sulfa antibiotics; Tylenol [acetaminophen]; Codeine; and Latex  Family History  Problem Relation Age of Onset  . Diabetes Mother   . Heart attack Mother   . Hypertension Father   . Thyroid disease Sister   . Sickle cell anemia Sister     Social History Social History   Tobacco Use  . Smoking status: Never Smoker  . Smokeless tobacco: Never Used  Substance Use Topics  . Alcohol use: Yes    Comment: socially  . Drug use: No    Review of Systems Constitutional: No fever/chills.  Positive altered mental status.  Positive postural lightheadedness.  No dizziness. Eyes: No visual changes. ENT: No sore throat. No congestion or rhinorrhea. Cardiovascular: Denies chest pain. Denies palpitations. Respiratory: Denies shortness of breath.  No cough. Gastrointestinal: No abdominal pain.  No nausea, no vomiting.  No diarrhea.  No constipation. Genitourinary: Negative for dysuria. Musculoskeletal: Negative for back pain. Skin: Negative for rash. Neurological: Negative for headaches. No focal numbness, tingling or weakness.  No visual changes, speech changes.  Positive confusion.  No difficulty walking.    ____________________________________________   PHYSICAL EXAM:  VITAL SIGNS: ED Triage Vitals  Enc Vitals Group     BP 07/20/18 1546 (!) 137/91     Pulse Rate 07/20/18 1546 89     Resp 07/20/18 1546 18     Temp 07/20/18 1546 98.5 F (36.9 C)     Temp Source 07/20/18 1546 Oral     SpO2 07/20/18 1546 98 %     Weight 07/20/18 1550 158 lb (71.7  kg)     Height 07/20/18 1550 5\' 4"  (1.626 m)     Head Circumference --      Peak Flow --      Pain Score 07/20/18 1550 0     Pain Loc --      Pain Edu? --      Excl. in [place]? --     Constitutional: Alert and oriented to person, place, but does not know the year or the date; she does notice September.  Chronically ill-appearing.  Answers  questions appropriately. Eyes: Conjunctivae are normal.  EOMI. No scleral icterus. Head: Atraumatic. Nose: No congestion/rhinnorhea. Mouth/Throat: Mucous membranes are moist.  Neck: No stridor.  Supple.  No JVD.  No meningismus peer Cardiovascular: Normal rate, regular rhythm. No murmurs, rubs or gallops.  Respiratory: Normal respiratory effort.  No accessory muscle use or retractions. Lungs CTAB.  No wheezes, rales or ronchi. Gastrointestinal: Soft, nontender and mildly distended.  She has a large umbilical hernia that is completely reducible without pain..  No guarding or rebound.  No peritoneal signs. Musculoskeletal: No LE edema. Neurologic: alert and confused speech is clear.  Face and smile are symmetric.  EOMI.  Moves all extremities well. Skin:  Skin is warm, dry and intact. No rash noted. Psychiatric: Mood and affect are normal. Speech and behavior are normal.  Normal judgement.  ____________________________________________   LABS (all labs ordered are listed, but only abnormal results are displayed)  Labs Reviewed  COMPREHENSIVE METABOLIC PANEL - Abnormal; Notable for the following components:      Result Value   CO2 17 (*)    Glucose, Bld 118 (*)    AST 48 (*)    Total Bilirubin 2.5 (*)    All other components within normal limits  AMMONIA - Abnormal; Notable for the following components:   Ammonia 89 (*)    All other components within normal limits  CBC  URINALYSIS, COMPLETE (UACMP) WITH MICROSCOPIC  CBG MONITORING, ED   ____________________________________________  EKG  ED ECG REPORT I, Anne-Caroline Mariea Clonts, the attending physician, personally viewed and interpreted this ECG.   Date: 07/20/2018  EKG Time: 1608  Rate: 86  Rhythm: normal sinus rhythm  Axis: normal  Intervals:none  ST&T Change: No STEMI  ____________________________________________  RADIOLOGY  No results  found.  ____________________________________________   PROCEDURES  Procedure(s) performed: None  Procedures  Critical Care performed: No ____________________________________________   INITIAL IMPRESSION / ASSESSMENT AND PLAN / ED COURSE  Pertinent labs & imaging results that were available during my care of the patient were reviewed by me and considered in my medical decision making (see chart for details).  63 y.o. female with a history of hep C presenting with several days of confusion and lightheadedness with standing.  Overall, the patient is hemodynamically stable.  However, I am concerned that she is not fully oriented, and her laboratory studies do show hyperammonemia with an ammonia of 89 today.  In addition, her bilirubin is elevated at 2.5 from a baseline of just over 1.  At this time, the patient will be given lactulose and admitted to the hospitalist for continued evaluation and treatment.  ____________________________________________  FINAL CLINICAL IMPRESSION(S) / ED DIAGNOSES  Final diagnoses:  Hyperammonemia ([place])  Confusion         NEW MEDICATIONS STARTED DURING THIS VISIT:  New Prescriptions   No medications on file      Eula Listen, MD 07/20/18 1708

## 2018-07-20 NOTE — ED Notes (Signed)
Pt denies needs, pt updated on delay to floor. Pt verbalizes understanding, lights dimmed for comfort.

## 2018-07-20 NOTE — H&P (Addendum)
[facility] [place] NAME: Roberta Lane    MR#:  [id number]  DATE OF BIRTH:  23-Jun-1956  DATE OF ADMISSION:  07/20/2018  PRIMARY CARE PHYSICIAN: [place] Pink, MD   REQUESTING/REFERRING PHYSICIAN: Mariea Clonts  CHIEF COMPLAINT: Dizziness, altered mental status   Chief Complaint  Patient presents with  . Dizziness  . Altered Mental Status    HISTORY OF PRESENT ILLNESS:  Roberta Lane  is a 62 y.o. female with a known history of diabetes mellitus type 2, hepatitis C with cirrhosis, ascites comes in because she was feeling lightheaded, unable to use the phone so she went to PCP. because of her history of liver cirrhosis PCP sent her here to evaluate for hepatic encephalopathy.  Near found to be 80, patient said that she does not use lactulose at all.  Noticed to be loopy and dizzy today and she also had vertigo thought it probably positional vertigo and went to PCP.  PCP suggested her to go to the ER due to her history of liver disease.  Has history of hepatitis C before and  treated for that as per her.  PAST MEDICAL HISTORY:   Past Medical History:  Diagnosis Date  . Diabetes mellitus without complication ([place])   . Hepatitis C   . Hypertension     PAST SURGICAL HISTOIRY:   Past Surgical History:  Procedure Laterality Date  . ABDOMINAL HYSTERECTOMY    . ESOPHAGOGASTRODUODENOSCOPY (EGD) WITH PROPOFOL N/A 11/23/2017   Procedure: ESOPHAGOGASTRODUODENOSCOPY (EGD) WITH PROPOFOL;  Surgeon: Toledo, Benay Pike, MD;  Location: [facility] ENDOSCOPY;  Service: Gastroenterology;  Laterality: N/A;    SOCIAL HISTORY:   Social History   Tobacco Use  . Smoking status: Never Smoker  . Smokeless tobacco: Never Used  Substance Use Topics  . Alcohol use: Yes    Comment: socially    FAMILY HISTORY:   Family History  Problem Relation Age of Onset  . Diabetes Mother   . Heart attack Mother   . Hypertension Father   . Thyroid disease  Sister   . Sickle cell anemia Sister     DRUG ALLERGIES:   Allergies  Allergen Reactions  . Penicillins Anaphylaxis  . Amlodipine Besylate Itching  . Diphenhydramine-Acetaminophen Other (See Comments)    Made my breast hurt.  . Enalapril   . Sulfa Antibiotics     Reaction unknown.  . Tylenol [Acetaminophen] Other (See Comments)    Breast pain  . Codeine Itching  . Latex Rash    REVIEW OF SYSTEMS:  CONSTITUTIONAL: No fever, fatigue or weakness.  EYES: No blurred or double vision.  EARS, NOSE, AND THROAT: No tinnitus or ear pain.  RESPIRATORY: No cough, shortness of breath, wheezing or hemoptysis.  CARDIOVASCULAR: No chest pain, orthopnea, edema.  GASTROINTESTINAL: No nausea, vomiting, diarrhea or abdominal pain.  GENITOURINARY: No dysuria, hematuria.  ENDOCRINE: No polyuria, nocturia,  HEMATOLOGY: No anemia, easy bruising or bleeding SKIN: No rash or lesion. MUSCULOSKELETAL: No joint pain or arthritis.   NEUROLOGIC: No tingling, numbness, weakness.  Dizzy, patient experienced a little bit confusion. PSYCHIATRY: No anxiety or depression.   MEDICATIONS AT HOME:   Prior to Admission medications   Medication Sig Start Date End Date Taking? Authorizing Provider  acetaminophen (TYLENOL) 325 MG tablet Take 2 tablets (650 mg total) by mouth every 6 (six) hours as needed for mild pain (or Fever >/= 101). Patient not taking: Reported on 12/12/2017 11/23/17   Nicholes Mango, MD  clobetasol  ointment (TEMOVATE) 0.05 % APP TOPICALLY AA BID 11/02/17   [provider]  furosemide (LASIX) 40 MG tablet Take by mouth. 06/12/18 06/12/19  [provider]  metFORMIN (GLUCOPHAGE) 500 MG tablet Take 1,000 mg by mouth 2 (two) times daily with a meal.     [provider]  spironolactone (ALDACTONE) 100 MG tablet Take 100 mg by mouth daily.  01/26/18 01/26/19  [provider]      VITAL SIGNS:  Blood pressure (!) 137/91, pulse 89, temperature 98.5 F (36.9 C),  temperature source Oral, resp. rate 18, height 5\' 4"  (1.626 m), weight 71.7 kg, SpO2 98 %.  PHYSICAL EXAMINATION:  GENERAL:  62 y.o.-year-old patient lying in the bed with no acute distress.  EYES: Pupils equal, round, reactive to light and accommodation. No scleral icterus. Extraocular muscles intact.  HEENT: Head atraumatic, normocephalic. Oropharynx and nasopharynx clear.  NECK:  Supple, no jugular venous distention. No thyroid enlargement, no tenderness.  LUNGS: Normal breath sounds bilaterally, no wheezing, rales,rhonchi or crepitation. No use of accessory muscles of respiration.  CARDIOVASCULAR: S1, S2 normal. No murmurs, rubs, or gallops.  ABDOMEN: Ascites, does have reducible umbilical hernia.  EXTREMITIES: No pedal edema, cyanosis, or clubbing.  NEUROLOGIC: Cranial nerves II through XII are intact. Muscle strength 5/5 in all extremities. Sensation intact. Gait not checked.  PSYCHIATRIC: The patient is alert and oriented x 3.  SKIN: No obvious rash, lesion, or ulcer.   LABORATORY PANEL:   CBC Recent Labs  Lab 07/20/18 1551  WBC 2.9*  HGB 11.1*  HCT 33.2*  PLT 90*   ------------------------------------------------------------------------------------------------------------------  Chemistries  Recent Labs  Lab 07/20/18 1551  NA 137  K 4.1  CL 109  CO2 17*  GLUCOSE 118*  BUN 9  CREATININE 0.70  CALCIUM 9.4  AST 48*  ALT 27  ALKPHOS 91  BILITOT 2.5*   ------------------------------------------------------------------------------------------------------------------  Cardiac Enzymes No results for input(s): TROPONINI in the last 168 hours. ------------------------------------------------------------------------------------------------------------------  RADIOLOGY:  No results found.  EKG:   Orders placed or performed during the hospital encounter of 07/20/18  . ED EKG  . ED EKG    IMPRESSION AND PLAN:   62 year old female patient with history of  hepatitis C, liver cirrhosis, diabetes mellitus type 2, ascites taking Lasix, Aldactone comes in because of dizziness, confusion found to have elevated ammonia.  Patient told me that she is taken of Lasix recently because it caused her urticaria so she is allergic to Lasix also. 1.  Hepatic encephalopathy: Likely cause of her dizziness , ammonia level 89.  Patient was dizzy and has vertigo  She is alert, awake, oriented now.  Started on low-dose lactulose, check ammonia level tomorrow, likely discharge home tomorrow with lactulose. 2.  Diabetes mellitus type 2: Continue metformin, add sliding scale with coverage. 3.  Liver cirrhosis, ascites, patient is on Aldactone: Continue that DC Lasix as it caused her allergy.  Patient follows with Dr. Alice Reichert.   All the records are reviewed and case discussed with ED provider. Management plans discussed with the patient, family and they are in agreement.  CODE STATUS: Full code  TOTAL TIME TAKING CARE OF THIS PATIENT: 55 minutes.    Epifanio Lesches M.D on 07/20/2018 at 5:36 PM  Between 7am to 6pm - Pager - [phone]  After 6pm go to [email or web address] - password [email or web address] [facility]  Office  [phone]  CC: Primary care physician; [place] Pink, MD  Note: This dictation was prepared with Dragon dictation  along with smaller phrase technology. Any transcriptional errors that result from this process are unintentional.

## 2018-07-21 LAB — CBC
HCT: 27.1 % — ABNORMAL LOW (ref 35.0–47.0)
Hemoglobin: 9.1 g/dL — ABNORMAL LOW (ref 12.0–16.0)
MCH: 26.9 pg (ref 26.0–34.0)
MCHC: 33.8 g/dL (ref 32.0–36.0)
MCV: 79.6 fL — ABNORMAL LOW (ref 80.0–100.0)
PLATELETS: 71 10*3/uL — AB (ref 150–440)
RBC: 3.4 MIL/uL — ABNORMAL LOW (ref 3.80–5.20)
RDW: 19 % — ABNORMAL HIGH (ref 11.5–14.5)
WBC: 2.4 10*3/uL — ABNORMAL LOW (ref 3.6–11.0)

## 2018-07-21 LAB — BASIC METABOLIC PANEL
Anion gap: 8 (ref 5–15)
BUN: 8 mg/dL (ref 8–23)
CALCIUM: 8.6 mg/dL — AB (ref 8.9–10.3)
CO2: 18 mmol/L — AB (ref 22–32)
CREATININE: 0.66 mg/dL (ref 0.44–1.00)
Chloride: 112 mmol/L — ABNORMAL HIGH (ref 98–111)
GFR calc Af Amer: >60 mL/min (ref >60)
GFR calc non Af Amer: >60 mL/min (ref >60)
GLUCOSE: 111 mg/dL — AB (ref 70–99)
Potassium: 4.1 mmol/L (ref 3.5–5.1)
Sodium: 138 mmol/L (ref 135–145)

## 2018-07-21 LAB — AMMONIA: AMMONIA: 54 umol/L — AB (ref 9–35)

## 2018-07-21 LAB — GLUCOSE, CAPILLARY: Glucose-Capillary: 101 mg/dL — ABNORMAL HIGH (ref 70–99)

## 2018-07-21 MED ORDER — LACTULOSE 10 GM/15ML PO SOLN: 30.0000 g | Freq: Two times a day (BID) | ORAL | 0 refills | Status: AC

## 2018-07-21 NOTE — Progress Notes (Signed)
Chaplain responded to an OR for prayer. Pt was "drifting off" but invited chaplain in. Pt is a retiree from [employer] in [place]. She is now getting bored and contemplating going back to work. She has liver diease and may be grieving the loss of her planned retirement.  She has very little family and that she does have is not very close. Chaplain prayed for a healing and an increased support system.    07/21/18 0800  Clinical Encounter Type  Visited With Patient  Visit Type Initial;Spiritual support  Referral From Nurse  Spiritual Encounters  Spiritual Needs Prayer

## 2018-07-21 NOTE — Progress Notes (Signed)
Patient discharged home on self. Discharge instruction given to patient. All questions answered, all concerns addressed.  Patient verbalized understanding.

## 2018-07-21 NOTE — Progress Notes (Signed)
Admitted for hepatic encephalopathy, patient ammonia level on admission 89 decreased to 54 with lactulose.  Patient feels better, stable for discharge, discharge instructions on the computer.  Answered patient questions.  Advised to continue lactulose 30 g p.o. twice daily

## 2018-07-21 NOTE — Plan of Care (Signed)
  Problem: Spiritual Needs Goal: Ability to function at adequate level Outcome: Adequate for Discharge   Problem: Education: Goal: Knowledge of General Education information will improve Description Including pain rating scale, medication(s)/side effects and non-pharmacologic comfort measures Outcome: Adequate for Discharge   Problem: Health Behavior/Discharge Planning: Goal: Ability to manage health-related needs will improve Outcome: Adequate for Discharge   Problem: Clinical Measurements: Goal: Ability to maintain clinical measurements within normal limits will improve Outcome: Adequate for Discharge Goal: Will remain free from infection Outcome: Adequate for Discharge Goal: Diagnostic test results will improve Outcome: Adequate for Discharge Goal: Respiratory complications will improve Outcome: Adequate for Discharge Goal: Cardiovascular complication will be avoided Outcome: Adequate for Discharge   Problem: Activity: Goal: Risk for activity intolerance will decrease Outcome: Adequate for Discharge   Problem: Nutrition: Goal: Adequate nutrition will be maintained Outcome: Adequate for Discharge   Problem: Coping: Goal: Level of anxiety will decrease Outcome: Adequate for Discharge   Problem: Elimination: Goal: Will not experience complications related to bowel motility Outcome: Adequate for Discharge Goal: Will not experience complications related to urinary retention Outcome: Adequate for Discharge   Problem: Pain Managment: Goal: General experience of comfort will improve Outcome: Adequate for Discharge   Problem: Safety: Goal: Ability to remain free from injury will improve Outcome: Adequate for Discharge   Problem: Skin Integrity: Goal: Risk for impaired skin integrity will decrease Outcome: Adequate for Discharge

## 2018-07-22 NOTE — Discharge Summary (Signed)
Roberta Lane, is a 62 y.o. female  DOB 1956-03-17  MRN [id number].  Admission date:  07/20/2018  Admitting Physician  Epifanio Lesches, MD  Discharge Date:  07/21/2018   Primary MD  [place] Pink, MD  Recommendations for primary care physician for things to follow:   Follow-up with PCP in 1 week Follow-up with GI . Dr. Alice Reichert in 10 days   Admission Diagnosis  Confusion [R41.0] Hyperammonemia ([place]) [E72.20]   Discharge Diagnosis  Confusion [R41.0] Hyperammonemia ([place]) [E72.20]   Active Problems:   Hepatic encephalopathy [facility])      Past Medical History:  Diagnosis Date  . Diabetes mellitus without complication ([place])   . Hepatitis C   . Hypertension     Past Surgical History:  Procedure Laterality Date  . ABDOMINAL HYSTERECTOMY    . ESOPHAGOGASTRODUODENOSCOPY (EGD) WITH PROPOFOL N/A 11/23/2017   Procedure: ESOPHAGOGASTRODUODENOSCOPY (EGD) WITH PROPOFOL;  Surgeon: Toledo, Benay Pike, MD;  Location: [facility] ENDOSCOPY;  Service: Gastroenterology;  Laterality: N/A;       History of present illness and  Hospital Course:     Kindly see H&P for history of present illness and admission details, please review complete Labs, Consult reports and Test reports for all details in brief  HPI  from the history and physical done on the day of admission 62 year old female patient with history of diabetes mellitus type 2, hep C with cirrhosis, ascites comes in because of some confusion, lightheadedness, found to have hepatic encephalopathy with ammonia of 89.   Hospital Course  #1. hepatic encephalopathy with ammonia of 89 on admission patient had trouble using her phone, dizziness so admitted her with her symptoms, started on lactulose.  Patient never was on lactulose at home.  So started on lactulose,next day  ammonia  level decreased to 54 and patient said that she feels much better. #2 diabetes mellitus type 2: Patient is on metformin. 3.  History of liver cirrhosis, ascites: Patient was started on Lasix but she developed allergy with hives so stopped the Lasix as per PCP recommendation, she is on lactone, continue that.  Discharge Condition: Stable   Follow UP      Discharge Instructions  and  Discharge Medications   Advised the patient to continue lactulose twice a day to prevent hepatic encephalopathy due to her history of hep C, liver cirrhosis.   Allergies as of 07/21/2018      Reactions   Penicillins Anaphylaxis   Amlodipine Besylate Itching   Diphenhydramine-acetaminophen Other (See Comments)   Made my breast hurt.   Enalapril    Sulfa Antibiotics    Reaction unknown.   Tylenol [acetaminophen] Other (See Comments)   Breast pain   Codeine Itching   Latex Rash      Medication List    STOP taking these medications   acetaminophen 325 MG tablet Commonly known as:  TYLENOL   furosemide 40 MG tablet Commonly known as:  LASIX     TAKE these medications   clobetasol ointment 0.05 % Commonly known as:  TEMOVATE APP TOPICALLY AA BID   lactulose 10 GM/15ML solution Commonly known as:  CHRONULAC Take 45 mLs (30 g total) by mouth 2 (two) times daily.   metFORMIN 500 MG tablet Commonly known as:  GLUCOPHAGE Take 1,000 mg by mouth 2 (two) times daily with a meal.   spironolactone 100 MG tablet Commonly known as:  ALDACTONE Take 100 mg by mouth daily.         Diet and Activity  recommendation: See Discharge Instructions above   Consults obtained -none   Major procedures and Radiology Reports - PLEASE review detailed and final reports for all details, in brief -      US Abdomen Limited Ruq  Result Date: 07/09/2018 CLINICAL DATA:  History of alcoholic cirrhosis, portal hypertension, previous cholecystectomy. EXAM: ULTRASOUND ABDOMEN LIMITED RIGHT UPPER QUADRANT  COMPARISON:  Abdominal and pelvic CT scan of January 29th 2019. FINDINGS: Gallbladder: The gallbladder is surgically absent. Common bile duct: Diameter: 3 mm Liver: The hepatic echotexture is heterogeneously increased. The surface contour is irregular. There are multiple hepatic cysts which appear to be simple in nature. The largest measures 2.1 x 1.6 x 1.6 cm. There is no discrete solid appearing mass. There is no intrahepatic ductal dilation. Portal vein is patent on color Doppler imaging with normal direction of blood flow towards the liver. There is ascites surrounding the liver. IMPRESSION: Cirrhotic changes within the liver. No suspicious masses. Ascites is present surrounding the liver. Normal common bile duct.  Previous cholecystectomy. Electronically Signed   By: David  Martinique [name]   On: 07/09/2018 09:46    Micro Results    No results found for this or any previous visit (from the past 240 hour(s)).     Today   Subjective:   Roberta Lane today has no headache,no chest abdominal pain,no new weakness tingling or numbness, feels much better wants to go home today.   Objective:   Blood pressure (!) 116/58, pulse 81, temperature 98.7 F (37.1 C), temperature source Oral, resp. rate 16, height 5\' 4"  (1.626 m), weight 71.7 kg, SpO2 95 %.  No intake or output data in the 24 hours ending 07/22/18 1217  Exam Awake Alert, Oriented x 3, No new F.N deficits, Normal affect [place].AT,PERRAL Supple Neck,No JVD, No cervical lymphadenopathy appriciated.  Symmetrical Chest wall movement, Good air movement bilaterally, CTAB RRR,No Gallops,Rubs or new Murmurs, No Parasternal Heave +ve B.Sounds, Abd Soft, Non tender, No organomegaly appriciated, No rebound -guarding or rigidity. No Cyanosis, Clubbing or edema, No new Rash or bruise  Data Review   CBC w Diff:  Lab Results  Component Value Date   WBC 2.4 (L) 07/21/2018   HGB 9.1 (L) 07/21/2018   HCT 27.1 (L) 07/21/2018   PLT 71 (L)  07/21/2018   LYMPHOPCT 21 07/04/2018   BANDSPCT 2 12/12/2017   MONOPCT 12 07/04/2018   EOSPCT 11 07/04/2018   BASOPCT 1 07/04/2018    CMP:  Lab Results  Component Value Date   NA 138 07/21/2018   K 4.1 07/21/2018   CL 112 (H) 07/21/2018   CO2 18 (L) 07/21/2018   BUN 8 07/21/2018   CREATININE 0.66 07/21/2018   PROT 7.9 07/20/2018   ALBUMIN 3.6 07/20/2018   BILITOT 2.5 (H) 07/20/2018   ALKPHOS 91 07/20/2018   AST 48 (H) 07/20/2018   ALT 27 07/20/2018  .   Total Time in preparing paper work, data evaluation and todays exam - 35 minutes  Epifanio Lesches M.D on 07/21/2018 at 12:17 PM    Note: This dictation was prepared with Dragon dictation along with smaller phrase technology. Any transcriptional errors that result from this process are unintentional.

## 2018-08-02 ENCOUNTER — Other Ambulatory Visit: Payer: Self-pay | Admitting: Internal Medicine

## 2018-08-02 DIAGNOSIS — K7031 Alcoholic cirrhosis of liver with ascites: Secondary | ICD-10-CM

## 2018-08-03 ENCOUNTER — Other Ambulatory Visit: Payer: Self-pay | Admitting: Internal Medicine

## 2018-08-03 ENCOUNTER — Ambulatory Visit
Admission: RE | Admit: 2018-08-03 | Discharge: 2018-08-03 | Disposition: A | Discharge status: Home / Self Care | Payer: Federal, State, Local not specified - PPO | Source: Ambulatory Visit | Attending: Internal Medicine | Admitting: Internal Medicine

## 2018-08-03 DIAGNOSIS — K7031 Alcoholic cirrhosis of liver with ascites: Secondary | ICD-10-CM

## 2018-08-03 IMAGING — US US ABDOMEN LIMITED
1 series · 4 of 4 positions shown · non-contrast
Comparison: [DATE]

CLINICAL DATA: Alcoholic cirrhosis, hypertension, ascites noticed
on previous months ultrasound

EXAM:
LIMITED ABDOMEN ULTRASOUND FOR ASCITES
TECHNIQUE: Limited ultrasound survey for ascites was performed in all four
abdominal quadrants.

[Series 1: us abdomen limited · 4 of 4 slices shown]
[im 1/4]
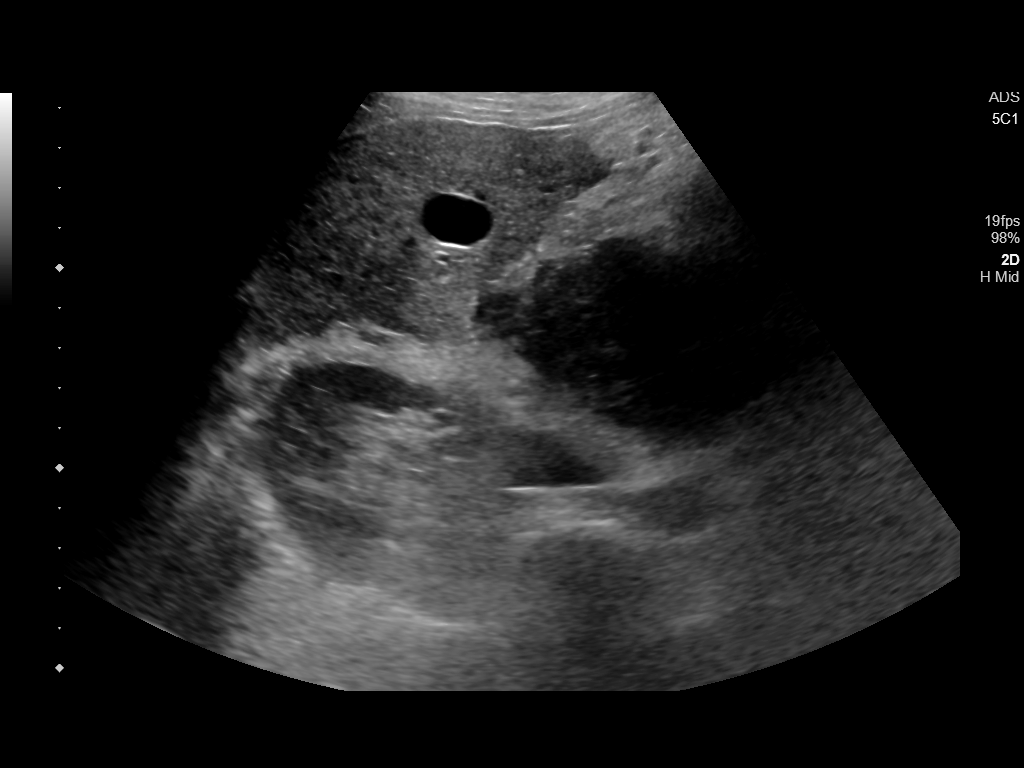
[im 2/4]
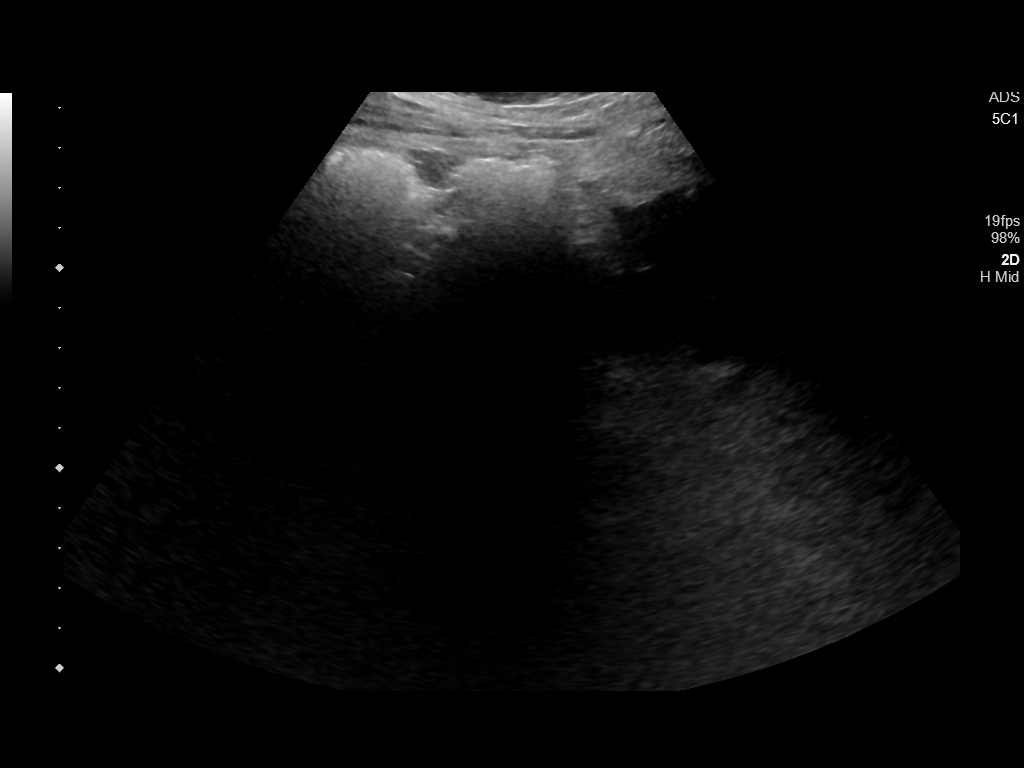
[im 3/4]
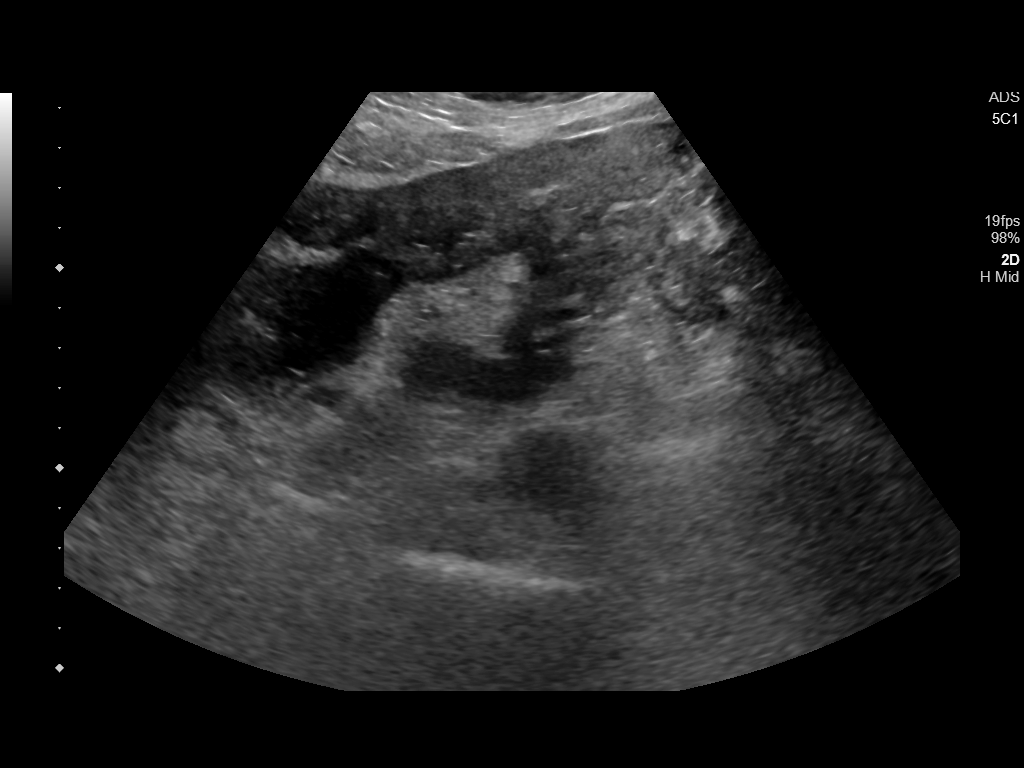
[im 4/4]
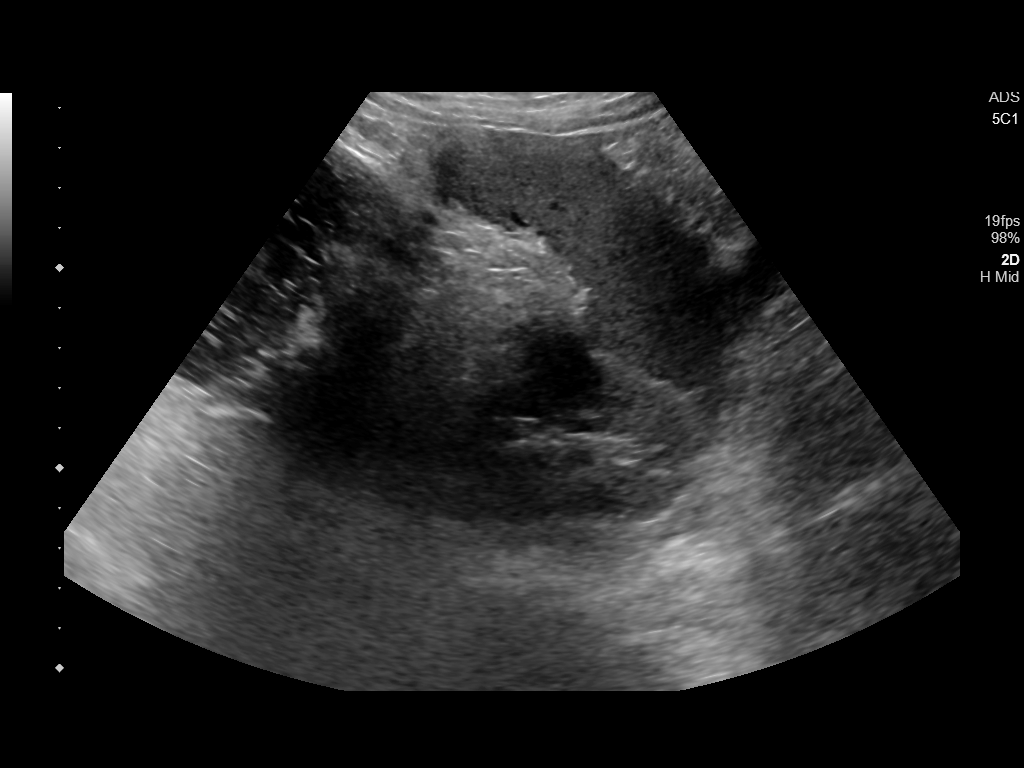

[4 of 4 positions shown; findings below may reference images not displayed]

FINDINGS: No significant abdominal ascites on survey evaluation of the
peritoneal cavity.
IMPRESSION: No significant ascites.  Paracentesis deferred.

## 2018-08-08 IMAGING — US US PARACENTESIS
1 series · 8 of 8 positions shown · non-contrast
Comparison: none

INDICATION: Hepatitis C. Cirrhosis. Ascites. Request for diagnostic and
therapeutic paracentesis.

[Series 1: us paracentesis · 0.26mm/px · 8 of 8 slices shown]
[im 1/8]
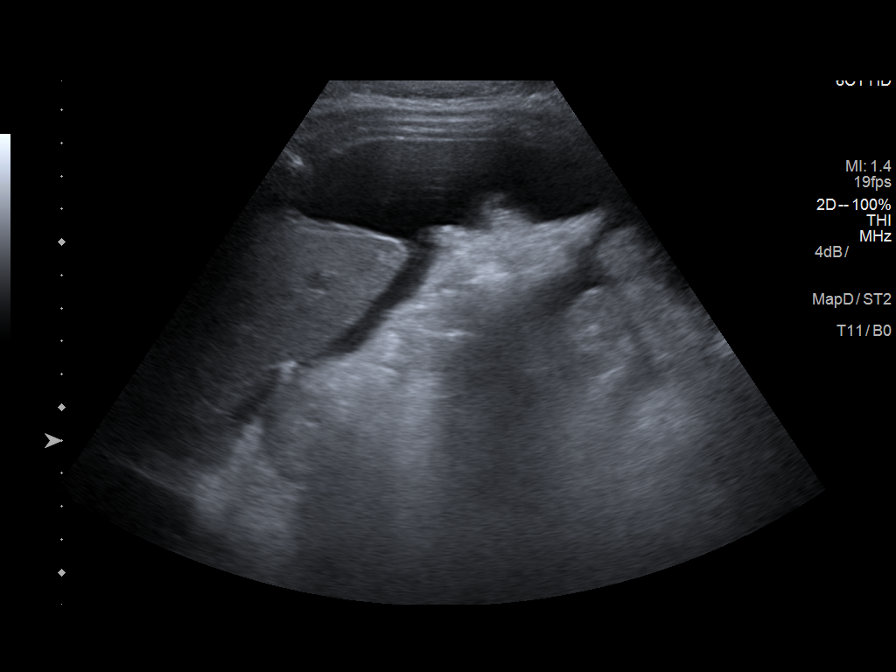
[im 2/8]
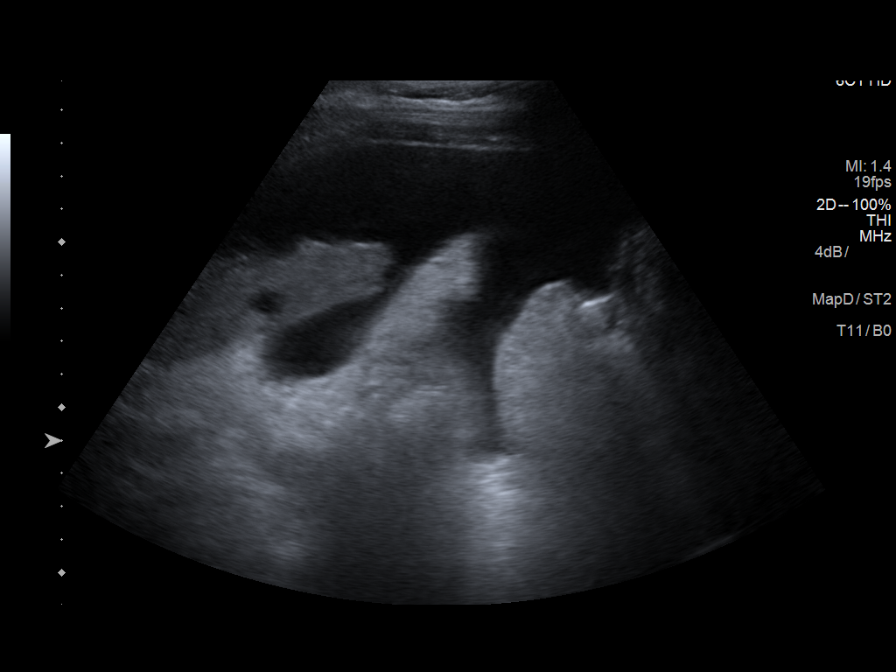
[im 3/8]
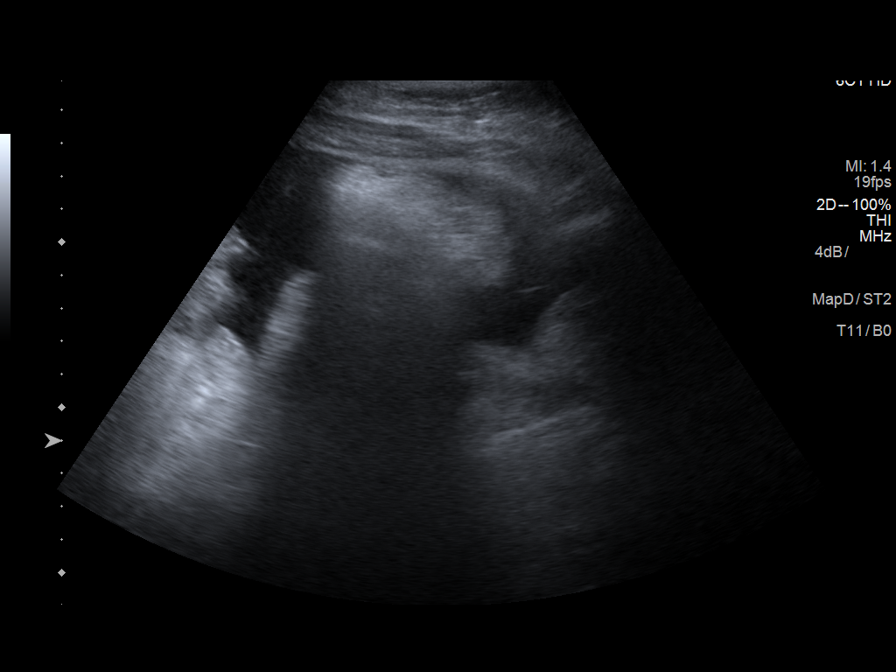
[im 4/8]
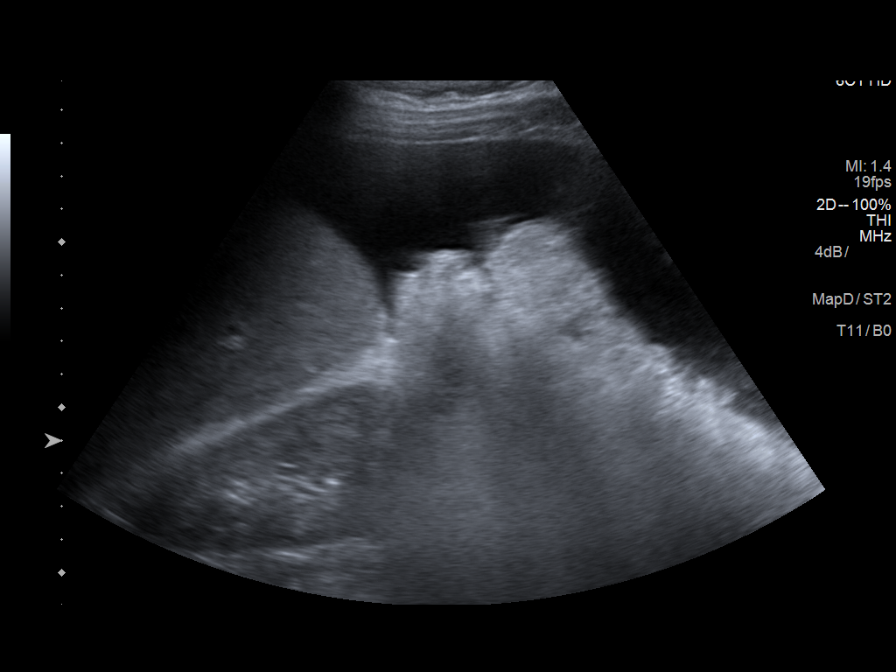
[im 5/8]
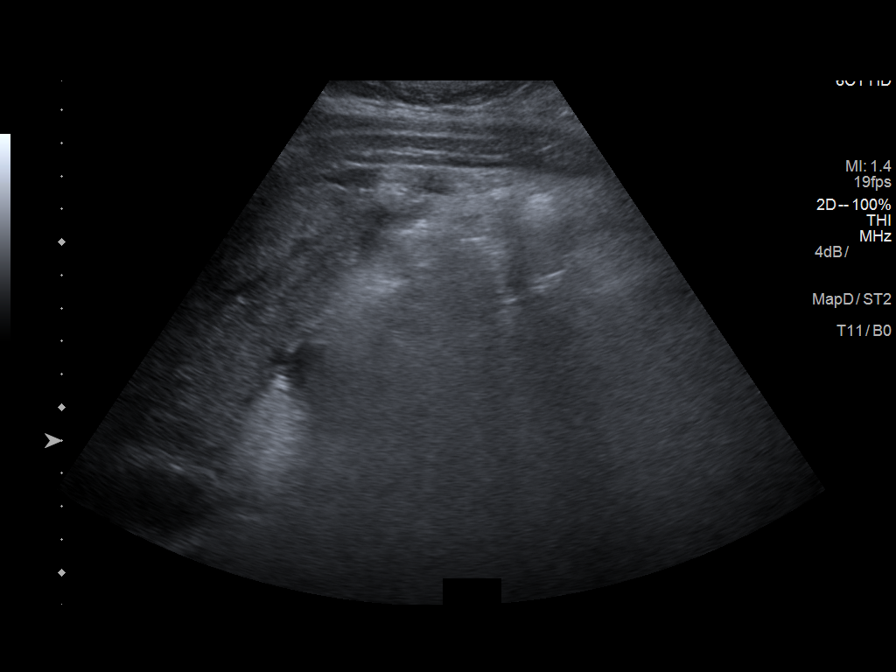
[im 6/8]
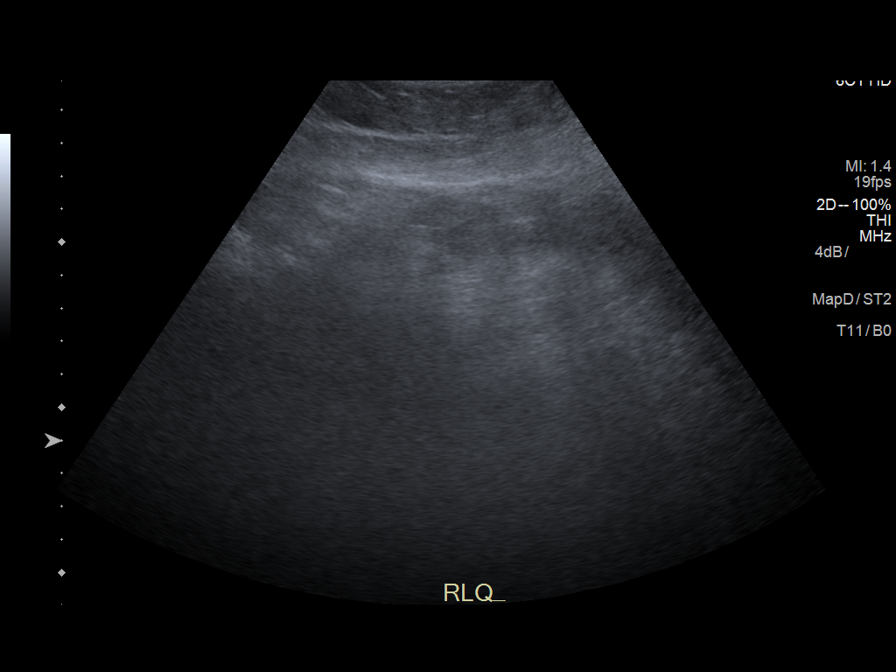
[im 7/8]
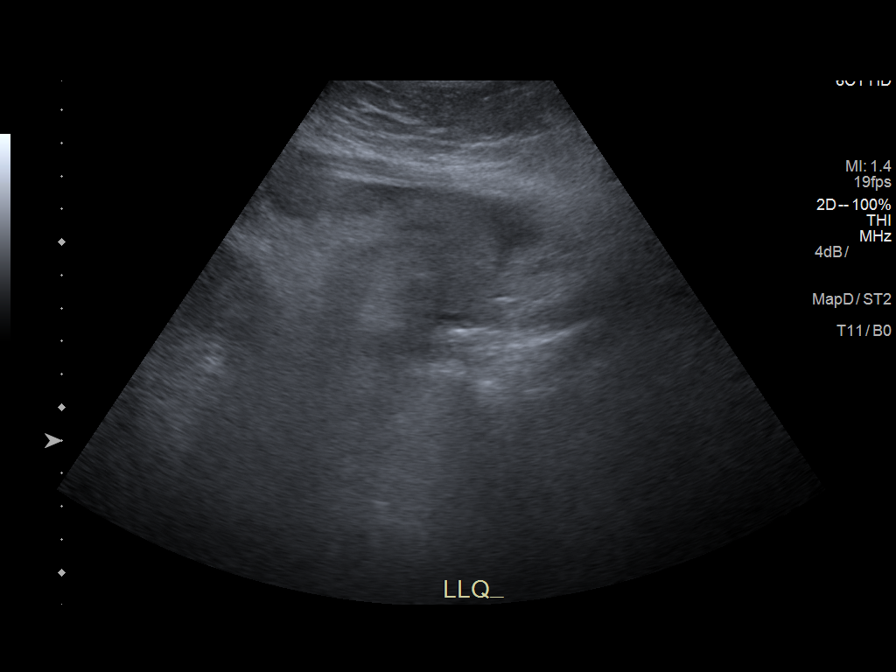
[im 8/8]
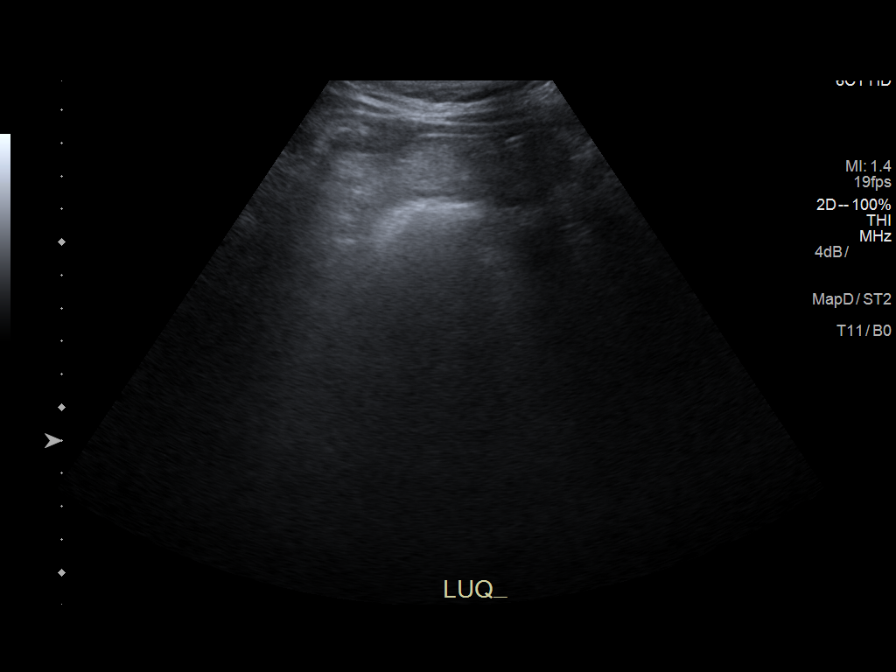

[8 of 8 positions shown; findings below may reference images not displayed]

EXAM:
ULTRASOUND GUIDED LEFT LATERAL ABDOMEN PARACENTESIS

MEDICATIONS:
1% Lidocaine = 10 mL

COMPLICATIONS:
None immediate.

PROCEDURE:
Informed written consent was obtained from the patient after a
discussion of the risks, benefits and alternatives to treatment. A
timeout was performed prior to the initiation of the procedure.

Initial ultrasound scanning demonstrates a moderate amount of
ascites within the left lateral abdomen. The left lateral abdomen
was prepped and draped in the usual sterile fashion. 1% lidocaine
was used for local anesthesia.

Following this, a 6 Fr Safe-T-Centesis catheter was introduced. An
ultrasound image was saved for documentation purposes. The
paracentesis was performed. The catheter was removed and a dressing
was applied. The patient tolerated the procedure well without
immediate post procedural complication.
FINDINGS: A total of approximately 2.6 liters of cloudy yellow fluid was
removed. Samples were sent to the laboratory as requested by the
clinical team.
IMPRESSION: Successful ultrasound-guided paracentesis yielding 2.6 liters of
peritoneal fluid.

## 2018-08-17 ENCOUNTER — Encounter: Payer: Self-pay | Admitting: Dietician

## 2018-08-17 ENCOUNTER — Encounter: Payer: Federal, State, Local not specified - PPO | Attending: Internal Medicine | Admitting: Dietician

## 2018-08-17 VITALS — Ht 64.0 in | Wt 166.3 lb

## 2018-08-17 DIAGNOSIS — K7031 Alcoholic cirrhosis of liver with ascites: Secondary | ICD-10-CM

## 2018-08-17 NOTE — Patient Instructions (Addendum)
   Low sugar options have 10g or less per serving  Your daily sodium limit is 2000mg  of sodium per day  Per meal: 600mg , 200mg  for a snack  A low sodium choice has 140mg  per serving or less. Look for labeling such as "hint of salt" or "low sodium" on labeling  Choose low sodium broths, zero sodium bouillon, salt free seasonings / herbs / spices  Use the plate method to plan meals: 1/2 plate veggies/fruit, 1/4 plate protein, 1/4 starch. Beans would be a starch and a protein  Mayo and avocado oil are ok!  Be careful with snack foods, frozen meals and "instant" products like instant potatoes. They are often high in salt. If you desire a high salt product, work your day around it to include it on occasion

## 2018-08-17 NOTE — Progress Notes (Signed)
Medical Nutrition Therapy: Visit start time: 2409  end time: 1630  Assessment:  Diagnosis: alcoholic liver cirrhosis with ascites  Past medical history: GI Bleed, DM type II, Hepatic encephalopathy Psychosocial issues/ stress concerns: None  Preferred learning method:  . Auditory . Hands-on  Current weight: 166.3#  Height: _0  Medications, supplements: Cipro, Temovate, Lactulose, Metformin, Rifaximin, Traxodone, Aldactone  Progress and evaluation: Pt has been advised to follow a low sodium diet (2g/day) and is unsure of how to measure her sodium intake day-to-day. She has been trying to read food labels but it is unclear whether she should be reading percentages or mg, and she does not yet know how to identify a low sodium choice. She is also a diabetic and tries to monitor sugar intake but is seeking additional guidance for this as well. Since moving from [place] she has been eating out much less and is learning how to cook with less salt. Uses avocado oil to cook at home and does not fry foods.   Physical activity: walks up to 5x/wk for 89mn each session  Dietary Intake:  Usual eating pattern includes 2-3 meals and 1-3 snacks per day. Dining out frequency: 2-4 meals per week.  Breakfast: Raisin Bran cereal with 2% milk, English muffin with jelly and salt free butter, whole wheat toast Snack: apple, strawberries, blueberries, NSA ice cream, tries not to eat chocolate Lunch: fruit, Triscuits, nuts Supper: if out: side salad + bisque soup + dinner roll, beef, mac n' cheese; if home: salads, steak, hamburger, fish, scallops, broccoli, chicken, roast Snack: popcorn, fruit Beverages: water  Nutrition Care Education: Topics covered: identifying high and low sodium foods, guidelines for 2g/day sodium restricted diet, sources of healthy and unhealthy fats, plate method for planning meals and managing DM type II Basic nutrition: basic food groups, appropriate nutrient balance, appropriate  meal and snack schedule, general nutrition guidelines Advanced nutrition: cooking techniques, dining out, food label reading particularly for sugar and salt Diabetes: appropriate meal and snack schedule, role of fiber, added vs natural sugars Other lifestyle changes:  benefits of making changes, increasing motivation, readiness for change, identifying habits that need to change  Nutritional Diagnosis:  [place]-2.1 Inpaired nutrition utilization As related to dx of alcoholic cirrhosis.  As evidenced by 2g/day sodium restriction.  Intervention: Discussion as noted above. Patient will continue to educate herself on following a low sodium (2g/day) diet using the information provided. She will practice reading food labels to identify low sodium and sugar foods and beverages and look for labeling which helps identify lower sodium options such as 'low sodium.' She will also practice experimenting cooking at home with salt free seasonings, herbs and spices as well as lower sodium broths.  Education Materials given:  . General diet guidelines for Low Sodium diet . Goals/ instructions  Learner/ who was taught:  . Patient   Level of understanding: .Marland KitchenVerbalizes/ demonstrates competency  Demonstrated degree of understanding via:   Teach back Learning barriers: . None  Willingness to learn/ readiness for change: . Eager, change in progress  Monitoring and Evaluation:  Dietary intake, fluid intake, and body weight      follow up: prn

## 2018-09-27 ENCOUNTER — Other Ambulatory Visit: Payer: Self-pay | Admitting: Family Medicine

## 2018-09-27 DIAGNOSIS — Z1231 Encounter for screening mammogram for malignant neoplasm of breast: Secondary | ICD-10-CM

## 2018-09-28 ENCOUNTER — Other Ambulatory Visit: Payer: Self-pay

## 2018-09-28 DIAGNOSIS — D61818 Other pancytopenia: Secondary | ICD-10-CM

## 2018-10-02 ENCOUNTER — Encounter: Payer: Self-pay | Admitting: *Deleted

## 2018-10-03 ENCOUNTER — Inpatient Hospital Stay: Payer: Federal, State, Local not specified - PPO | Attending: Oncology

## 2018-10-22 ENCOUNTER — Encounter: Payer: Self-pay | Admitting: *Deleted

## 2018-10-23 ENCOUNTER — Ambulatory Visit: Payer: Federal, State, Local not specified - PPO | Admitting: Certified Registered"

## 2018-10-23 ENCOUNTER — Encounter
Admission: RE | Disposition: A | Discharge status: Home / Self Care | Payer: Self-pay | Source: Home / Self Care | Attending: Internal Medicine

## 2018-10-23 ENCOUNTER — Ambulatory Visit
Admission: RE | Admit: 2018-10-23 | Discharge: 2018-10-23 | Disposition: A | Discharge status: Home / Self Care | Payer: Federal, State, Local not specified - PPO | Attending: Internal Medicine | Admitting: Internal Medicine

## 2018-10-23 DIAGNOSIS — K3189 Other diseases of stomach and duodenum: Secondary | ICD-10-CM | POA: Diagnosis not present

## 2018-10-23 DIAGNOSIS — Z1389 Encounter for screening for other disorder: Secondary | ICD-10-CM | POA: Diagnosis present

## 2018-10-23 DIAGNOSIS — E119 Type 2 diabetes mellitus without complications: Secondary | ICD-10-CM | POA: Insufficient documentation

## 2018-10-23 DIAGNOSIS — K766 Portal hypertension: Secondary | ICD-10-CM | POA: Insufficient documentation

## 2018-10-23 DIAGNOSIS — I8500 Esophageal varices without bleeding: Secondary | ICD-10-CM | POA: Insufficient documentation

## 2018-10-23 DIAGNOSIS — K7460 Unspecified cirrhosis of liver: Secondary | ICD-10-CM | POA: Insufficient documentation

## 2018-10-23 DIAGNOSIS — Z79899 Other long term (current) drug therapy: Secondary | ICD-10-CM | POA: Diagnosis not present

## 2018-10-23 DIAGNOSIS — Z7984 Long term (current) use of oral hypoglycemic drugs: Secondary | ICD-10-CM | POA: Diagnosis not present

## 2018-10-23 DIAGNOSIS — I10 Essential (primary) hypertension: Secondary | ICD-10-CM | POA: Insufficient documentation

## 2018-10-23 HISTORY — PX: ESOPHAGOGASTRODUODENOSCOPY (EGD) WITH PROPOFOL: SHX5813

## 2018-10-23 HISTORY — DX: Unspecified abdominal hernia without obstruction or gangrene: K46.9

## 2018-10-23 HISTORY — DX: Hyperlipidemia, unspecified: E78.5

## 2018-10-23 HISTORY — DX: Headache, unspecified: R51.9

## 2018-10-23 HISTORY — DX: Portal hypertension: K76.6

## 2018-10-23 HISTORY — DX: Gastro-esophageal reflux disease without esophagitis: K21.9

## 2018-10-23 HISTORY — DX: Esophageal varices without bleeding: I85.00

## 2018-10-23 HISTORY — DX: Nonrheumatic aortic (valve) insufficiency: I35.1

## 2018-10-23 HISTORY — DX: Headache: R51

## 2018-10-23 HISTORY — DX: Depression, unspecified: F32.A

## 2018-10-23 HISTORY — DX: Palpitations: R00.2

## 2018-10-23 HISTORY — DX: Major depressive disorder, single episode, unspecified: F32.9

## 2018-10-23 HISTORY — DX: Unspecified cirrhosis of liver: K74.60

## 2018-10-23 HISTORY — DX: Allergy status to unspecified drugs, medicaments and biological substances: Z88.9

## 2018-10-23 HISTORY — DX: Urinary tract infection, site not specified: N39.0

## 2018-10-23 HISTORY — DX: Liver disease, unspecified: K76.9

## 2018-10-23 HISTORY — DX: Cardiac arrhythmia, unspecified: I49.9

## 2018-10-23 LAB — GLUCOSE, CAPILLARY: GLUCOSE-CAPILLARY: 97 mg/dL (ref 70–99)

## 2018-10-23 SURGERY — ESOPHAGOGASTRODUODENOSCOPY (EGD) WITH PROPOFOL
Anesthesia: General

## 2018-10-23 MED ORDER — SODIUM CHLORIDE 0.9 % IV SOLN
INTRAVENOUS | Status: DC
  Administered 2018-10-23: 09:00:00 via INTRAVENOUS

## 2018-10-23 MED ORDER — PROPOFOL 10 MG/ML IV BOLUS
INTRAVENOUS | Status: DC | PRN
  Administered 2018-10-23 (×2): 30 mg via INTRAVENOUS

## 2018-10-23 MED ORDER — FENTANYL CITRATE (PF) 100 MCG/2ML IJ SOLN
INTRAMUSCULAR | Status: AC
  Filled 2018-10-23: qty 2

## 2018-10-23 MED ORDER — PHENYLEPHRINE HCL 10 MG/ML IJ SOLN
INTRAMUSCULAR | Status: DC | PRN
  Administered 2018-10-23 (×2): 100 ug via INTRAVENOUS

## 2018-10-23 MED ORDER — FENTANYL CITRATE (PF) 100 MCG/2ML IJ SOLN
INTRAMUSCULAR | Status: DC | PRN
  Administered 2018-10-23: 25 ug via INTRAVENOUS

## 2018-10-23 MED ORDER — PROPOFOL 10 MG/ML IV BOLUS
INTRAVENOUS | Status: AC
  Filled 2018-10-23: qty 40

## 2018-10-23 MED ORDER — PROPOFOL 500 MG/50ML IV EMUL
INTRAVENOUS | Status: DC | PRN
  Administered 2018-10-23: 100 ug/kg/min via INTRAVENOUS

## 2018-10-23 NOTE — Anesthesia Post-op Follow-up Note (Signed)
Anesthesia QCDR form completed.        

## 2018-10-23 NOTE — Transfer of Care (Signed)
Immediate Anesthesia Transfer of Care Note  Patient: Roberta Lane  Procedure(s) Performed: ESOPHAGOGASTRODUODENOSCOPY (EGD) WITH PROPOFOL (N/A )  Patient Location: Endoscopy Unit  Anesthesia Type:General  Level of Consciousness: awake  Airway & Oxygen Therapy: Patient Spontanous Breathing and Patient connected to nasal cannula oxygen  Post-op Assessment: Report given to RN and Post -op Vital signs reviewed and stable  Post vital signs: stable  Last Vitals:  Vitals Value Taken Time  BP 111/50 10/23/2018  9:20 AM  Temp    Pulse 81 10/23/2018  9:24 AM  Resp 15 10/23/2018  9:24 AM  SpO2 100 % 10/23/2018  9:24 AM  Vitals shown include unvalidated device data.  Last Pain:  Vitals:   10/23/18 0920  TempSrc: Tympanic         Complications: No apparent anesthesia complications

## 2018-10-23 NOTE — Op Note (Signed)
[facility] Gastroenterology Patient Name: Roberta Lane Procedure Date: 10/23/2018 8:38 AM MRN: [id number] Account #: [id number] Date of Birth: [birth year] Admit Type: Outpatient Age: 62 Room: [facility] ENDO ROOM 2 Gender: Female Note Status: Finalized Procedure:            Upper GI endoscopy Indications:          Follow-up of esophageal varices in patient with                        suspected portal hypertension Providers:            Benay Pike. Alice Reichert MD, MD Referring MD:         Irven Easterly. Kary Kos, MD (Referring MD) Medicines:            Propofol per Anesthesia Complications:        No immediate complications. Procedure:            Pre-Anesthesia Assessment:                       - The risks and benefits of the procedure and the                        sedation options and risks were discussed with the                        patient. All questions were answered and informed                        consent was obtained.                       - Patient identification and proposed procedure were                        verified prior to the procedure by the nurse. The                        procedure was verified in the procedure room.                       - ASA Grade Assessment: III - A patient with severe                        systemic disease.                       - After reviewing the risks and benefits, the patient                        was deemed in satisfactory condition to undergo the                        procedure.                       After obtaining informed consent, the endoscope was                        passed under direct vision. Throughout the procedure,  the patient's blood pressure, pulse, and oxygen                        saturations were monitored continuously. The Endoscope                        was introduced through the mouth, and advanced to the                        third part of duodenum. The upper GI endoscopy  was                        accomplished without difficulty. The patient tolerated                        the procedure well. Findings:      Grade II varices were found in the middle third of the esophagus and in       the lower third of the esophagus.      There is no endoscopic evidence of bleeding or red wale signs, cherry       red spots, clots, tears or other stigmata or recent bleed in the entire       esophagus.      A medium amount of food (residue) was found in the gastric fundus.      Mild portal hypertensive gastropathy was found in the entire examined       stomach.      The examined duodenum was normal.      The exam was otherwise without abnormality. Impression:           - Grade II esophageal varices.                       - A medium amount of food (residue) in the stomach.                       - Portal hypertensive gastropathy.                       - Normal examined duodenum.                       - The examination was otherwise normal.                       - No specimens collected. Recommendation:       - Patient has a contact number available for                        emergencies. The signs and symptoms of potential                        delayed complications were discussed with the patient.                        Return to normal activities tomorrow. Written discharge                        instructions were provided to the patient.                       -  Resume previous diet.                       - Continue present medications.                       - Repeat upper endoscopy in 1 year for surveillance.                       - Return to my office in 3 months.                       - The findings and recommendations were discussed with                        the patient and their family. Procedure Code(s):    --- Professional ---                       [phone], Esophagogastroduodenoscopy, flexible, transoral;                        diagnostic, including collection  of specimen(s) by                        brushing or washing, when performed (separate procedure) Diagnosis Code(s):    --- Professional ---                       K31.89, Other diseases of stomach and duodenum                       K76.6, Portal hypertension                       I85.00, Esophageal varices without bleeding CPT copyright 2018 American [employer] Association. All rights reserved. The codes documented in this report are preliminary and upon coder review may  be revised to meet current compliance requirements. Efrain Sella MD, MD 10/23/2018 9:16:12 AM This report has been signed electronically. Number of Addenda: 0 Note Initiated On: 10/23/2018 8:38 AM      [facility]

## 2018-10-23 NOTE — H&P (Signed)
  Outpatient short stay form Pre-procedure 10/23/2018 8:35 AM Cesar Alf K. Alice Reichert, [name]  Primary Physician: Maryland Pink, MD  Reason for visit: Portal venous hypertension, history of esophageal varices, remote history of hepatitis C status post antiviral therapy eradication.  History of present illness: Ms. Keeble is a pleasant 62 year old female presenting for surveillance for esophageal gastric varices.  Patient had grade 1-2 esophageal varices noted on her initial EGD with me on November 23, 2017.  No stigmata of bleeding were noted and the patient has done well in that regard over the past year.  She has intermittent problems with encephalopathy and her dosing of lactulose appears to be somewhat unreliable as she only has 1-2 stools per day.  She is taking rifaximin mean, however, as directed per her history.    Current Facility-Administered Medications:  .  0.9 %  sodium chloride infusion, , Intravenous, Continuous, Deadrian Toya, Benay Pike, MD  Medications Prior to Admission  Medication Sig Dispense Refill Last Dose  . ciprofloxacin (CIPRO) 500 MG tablet Take by mouth.   Taking  . clobetasol ointment (TEMOVATE) 0.05 % APP TOPICALLY AA BID  1 Not Taking  . lactulose (CHRONULAC) 10 GM/15ML solution Take 45 mLs (30 g total) by mouth 2 (two) times daily. 240 mL 0 Taking  . metFORMIN (GLUCOPHAGE) 500 MG tablet Take 1,000 mg by mouth 2 (two) times daily with a meal.    Taking  . rifaximin (XIFAXAN) 550 MG TABS tablet Take by mouth.   Taking  . spironolactone (ALDACTONE) 100 MG tablet Take 100 mg by mouth daily.    Taking  . traZODone (DESYREL) 50 MG tablet Take by mouth.   Taking     Allergies  Allergen Reactions  . Penicillins Anaphylaxis  . Amlodipine Besylate Itching  . Diphenhydramine-Acetaminophen Other (See Comments)    Made my breast hurt.  . Enalapril   . Furosemide   . Sulfa Antibiotics     Reaction unknown.  . Tylenol [Acetaminophen] Other (See Comments)    Breast pain  .  Codeine Itching  . Latex Rash     Past Medical History:  Diagnosis Date  . Allergic genetic state   . Aortic ejection murmur   . Depression   . Diabetes mellitus without complication ([place])   . Dysrhythmia   . GERD (gastroesophageal reflux disease)   . Headache   . Hepatic cirrhosis ([place])   . Hepatitis C   . Hernia, abdominal   . Hyperlipidemia   . Hypertension   . Liver disease   . Palpitations   . Portal hypertension with esophageal varices (HCC)   . UTI (urinary tract infection)     Review of systems:  Otherwise negative.    Physical Exam  Gen: Alert, oriented. Appears stated age.  HEENT: [place]/AT. PERRLA. Lungs: CTA, no wheezes. CV: RR nl S1, S2. Abd: soft, benign, no masses. BS+ Ext: No edema. Pulses 2+    Planned procedures: Proceed with EGD. The patient understands the nature of the planned procedure, indications, risks, alternatives and potential complications including but not limited to bleeding, infection, perforation, damage to internal organs and possible oversedation/side effects from anesthesia. The patient agrees and gives consent to proceed.  Please refer to procedure notes for findings, recommendations and patient disposition/instructions.     Kahle Mcqueen K. Alice Reichert, [name] Gastroenterology 10/23/2018  8:35 AM

## 2018-10-23 NOTE — Anesthesia Postprocedure Evaluation (Signed)
Anesthesia Post Note  Patient: Anwita Mencer  Procedure(s) Performed: ESOPHAGOGASTRODUODENOSCOPY (EGD) WITH PROPOFOL (N/A )  Patient location during evaluation: Endoscopy Anesthesia Type: General Level of consciousness: awake and alert and oriented Pain management: pain level controlled Vital Signs Assessment: post-procedure vital signs reviewed and stable Respiratory status: spontaneous breathing, nonlabored ventilation and respiratory function stable Cardiovascular status: blood pressure returned to baseline and stable Postop Assessment: no signs of nausea or vomiting Anesthetic complications: no     Last Vitals:  Vitals:   10/23/18 0930 10/23/18 0940  BP: (!) 114/58 (!) 112/57  Pulse: 78 77  Resp: 14 16  Temp:    SpO2: 100% 100%    Last Pain:  Vitals:   10/23/18 0920  TempSrc: Tympanic                 Curren Mohrmann

## 2018-10-23 NOTE — Interval H&P Note (Signed)
History and Physical Interval Note:  10/23/2018 8:37 AM  Roberta Lane  has presented today for surgery, with the diagnosis of PORTAL HYPERTENSION W/ESOPHAGEAL VARICES  The various methods of treatment have been discussed with the patient and family. After consideration of risks, benefits and other options for treatment, the patient has consented to  Procedure(s): ESOPHAGOGASTRODUODENOSCOPY (EGD) WITH PROPOFOL (N/A) as a surgical intervention .  The patient's history has been reviewed, patient examined, no change in status, stable for surgery.  I have reviewed the patient's chart and labs.  Questions were answered to the patient's satisfaction.     [place], [place]

## 2018-10-23 NOTE — Anesthesia Preprocedure Evaluation (Signed)
Anesthesia Evaluation  Patient identified by MRN, date of birth, ID band Patient awake    Reviewed: Allergy & Precautions, NPO status , Patient's Chart, lab work & pertinent test results  History of Anesthesia Complications Negative for: history of anesthetic complications  Airway Mallampati: II  TM Distance: >3 FB Neck ROM: Full    Dental no notable dental hx.    Pulmonary neg pulmonary ROS, neg sleep apnea, neg COPD,    breath sounds clear to auscultation- rhonchi (-) wheezing      Cardiovascular hypertension, (-) CAD, (-) Past MI, (-) Cardiac Stents and (-) CABG  Rhythm:Regular Rate:Normal - Systolic murmurs and - Diastolic murmurs    Neuro/Psych  Headaches, PSYCHIATRIC DISORDERS Depression    GI/Hepatic GERD  ,(+) Cirrhosis   Esophageal Varices    , Hepatitis -, C  Endo/Other  diabetes, Oral Hypoglycemic Agents  Renal/GU      Musculoskeletal negative musculoskeletal ROS (+)   Abdominal (+) - obese,   Peds  Hematology negative hematology ROS (+)   Anesthesia Other Findings Past Medical History: No date: Allergic genetic state No date: Aortic ejection murmur No date: Depression No date: Diabetes mellitus without complication (HCC) No date: Dysrhythmia No date: GERD (gastroesophageal reflux disease) No date: Headache No date: Hepatic cirrhosis (HCC) No date: Hepatitis C No date: Hernia, abdominal No date: Hyperlipidemia No date: Hypertension No date: Liver disease No date: Palpitations No date: Portal hypertension with esophageal varices (HCC) No date: UTI (urinary tract infection)   Reproductive/Obstetrics                             Anesthesia Physical Anesthesia Plan  ASA: III  Anesthesia Plan: General   Post-op Pain Management:    Induction: Intravenous  PONV Risk Score and Plan: 2 and Propofol infusion  Airway Management Planned: Natural Airway  Additional  Equipment:   Intra-op Plan:   Post-operative Plan:   Informed Consent: I have reviewed the patients History and Physical, chart, labs and discussed the procedure including the risks, benefits and alternatives for the proposed anesthesia with the patient or authorized representative who has indicated his/her understanding and acceptance.   Dental advisory given  Plan Discussed with: CRNA and Anesthesiologist  Anesthesia Plan Comments:         Anesthesia Quick Evaluation

## 2018-10-25 ENCOUNTER — Encounter: Payer: Self-pay | Admitting: Internal Medicine

## 2018-11-01 ENCOUNTER — Ambulatory Visit
Admission: RE | Admit: 2018-11-01 | Discharge: 2018-11-01 | Disposition: A | Discharge status: Home / Self Care | Payer: Federal, State, Local not specified - PPO | Source: Ambulatory Visit | Attending: Family Medicine | Admitting: Family Medicine

## 2018-11-01 DIAGNOSIS — Z1231 Encounter for screening mammogram for malignant neoplasm of breast: Secondary | ICD-10-CM

## 2018-11-01 IMAGING — MG DIGITAL SCREENING BILATERAL MAMMOGRAM WITH TOMO AND CAD
8 series · 8 of 24 positions shown · non-contrast
Comparison: Previous exam(s).

CLINICAL DATA: Screening.

EXAM:
DIGITAL SCREENING BILATERAL MAMMOGRAM WITH TOMO AND CAD

[L MLO synth-2D]
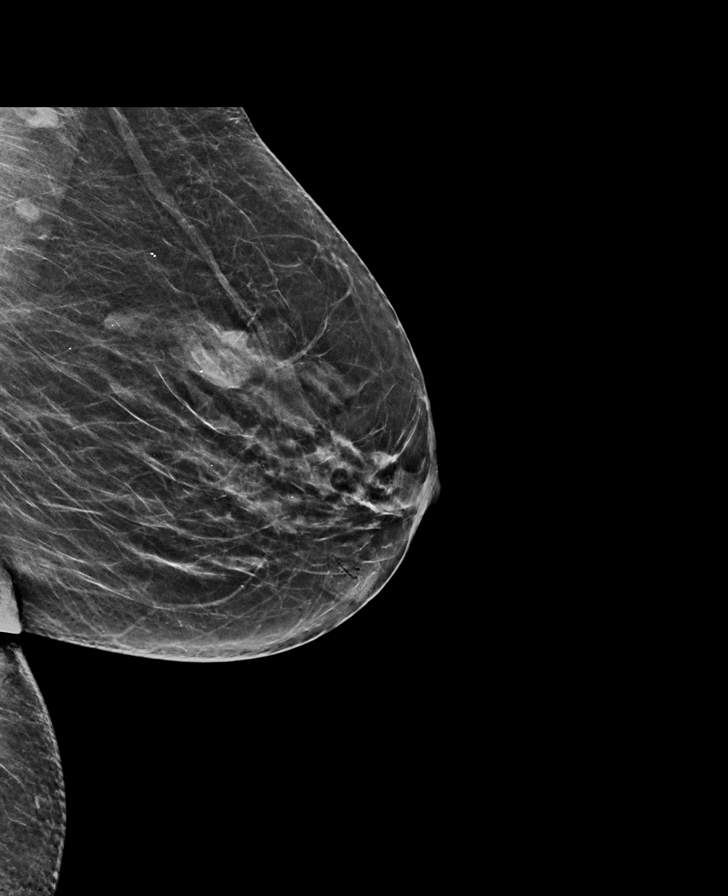

[R CC synth-2D]
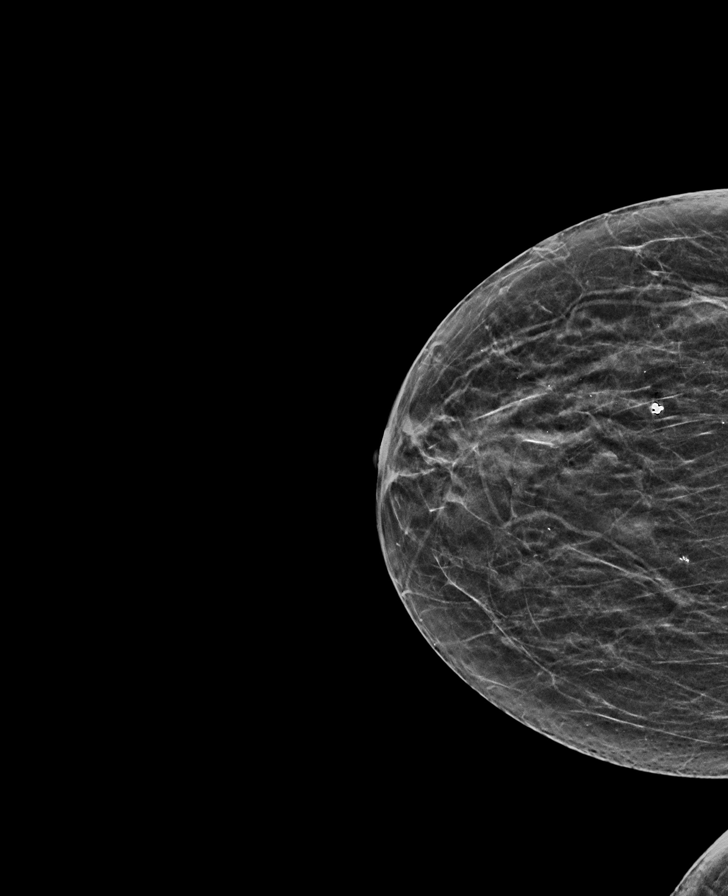

[L CC synth-2D]
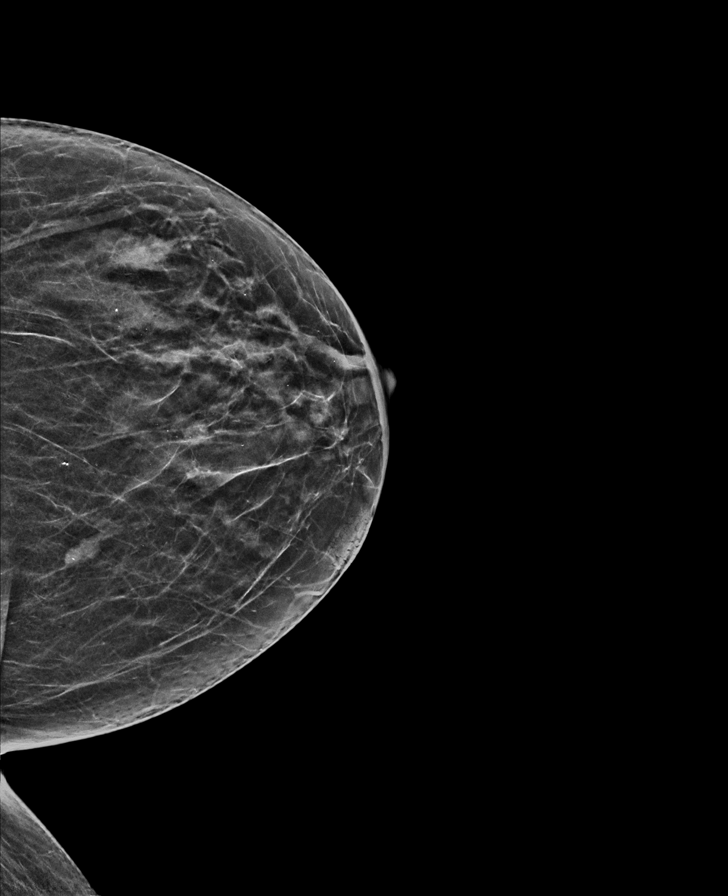

[R MLO synth-2D]
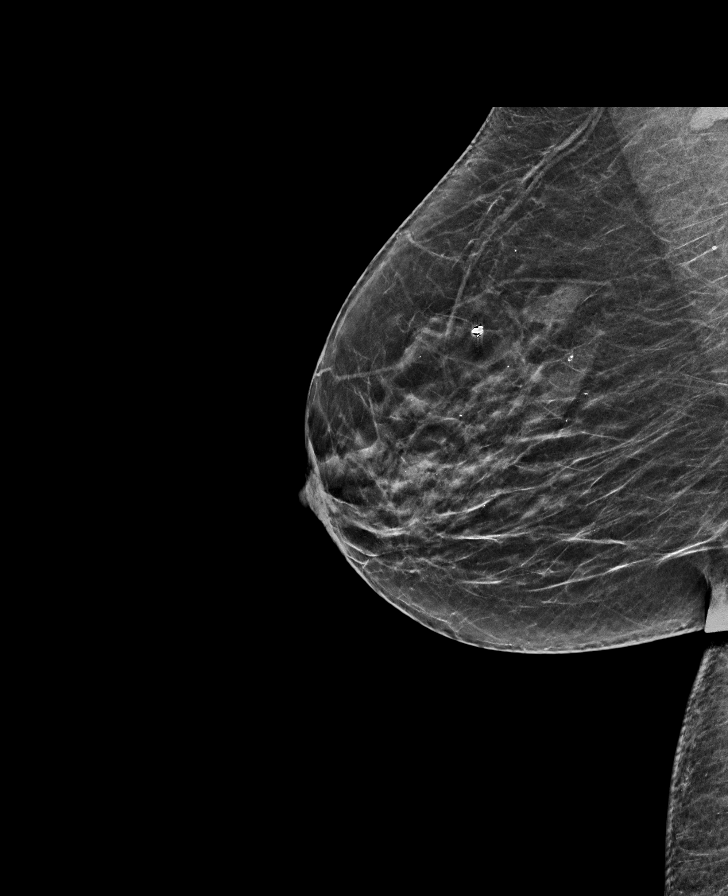

[R CC tomo · tomo slice 23/45.0]
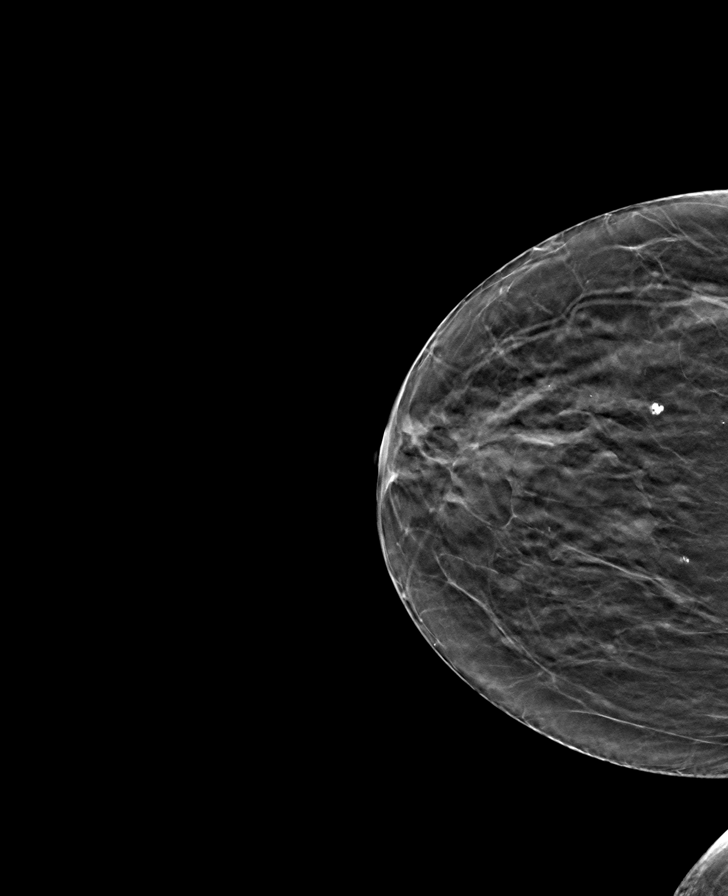

[L MLO tomo · tomo slice 30/59.0]
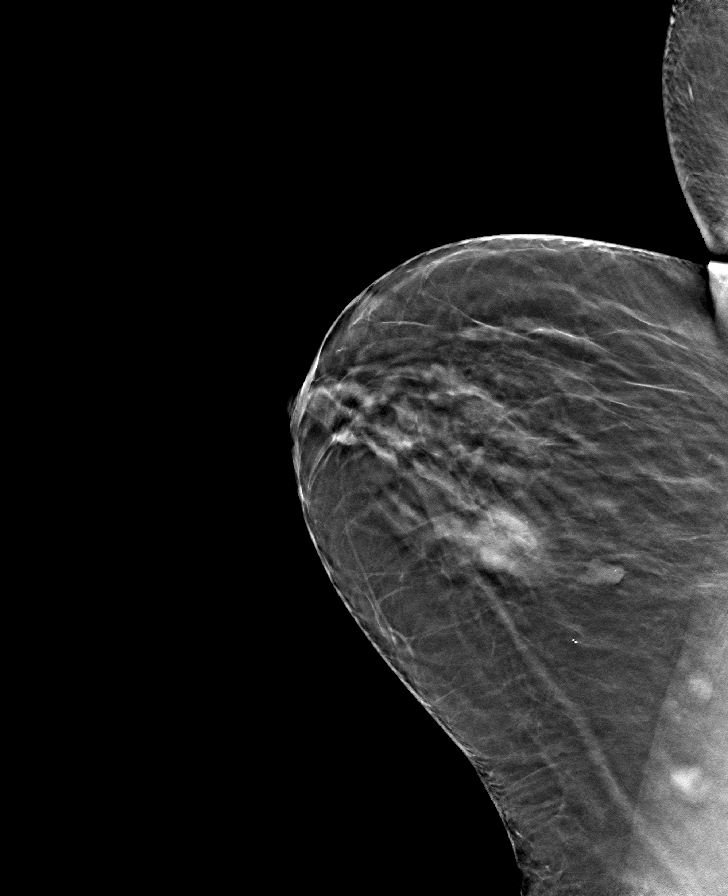

[L CC tomo · tomo slice 26/51.0]
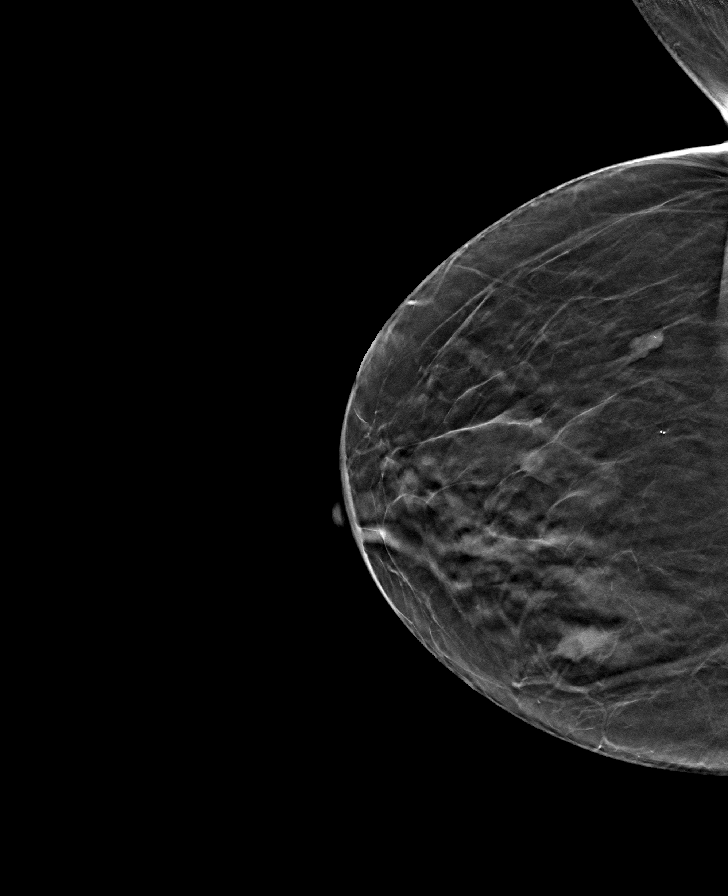

[R MLO tomo · tomo slice 29/57.0]
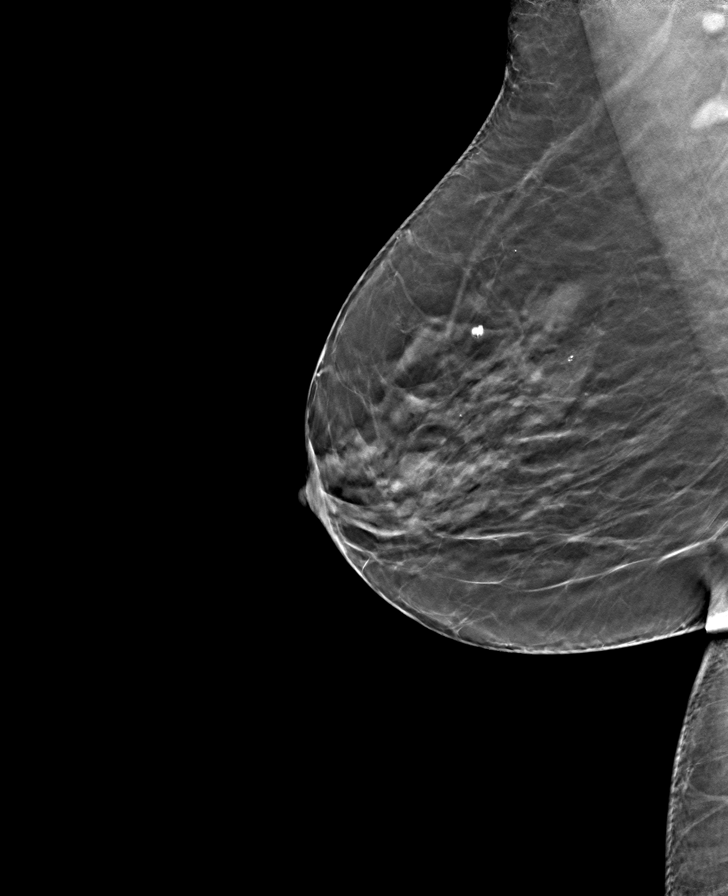

[8 of 24 positions shown; findings below may reference images not displayed]

ACR Breast Density Category b: There are scattered areas of
fibroglandular density.
FINDINGS: In the left breast, a possible mass warrants further evaluation. In
the right breast, no findings suspicious for malignancy.

Images were processed with CAD.
IMPRESSION: Further evaluation is suggested for possible mass in the left
breast.

RECOMMENDATION:
Diagnostic mammogram and possibly ultrasound of the left breast.
(Code:[R1])

The patient will be contacted regarding the findings, and additional
imaging will be scheduled.

BI-RADS CATEGORY  0: Incomplete. Need additional imaging evaluation
and/or prior mammograms for comparison.

## 2018-11-12 ENCOUNTER — Other Ambulatory Visit: Payer: Self-pay | Admitting: Family Medicine

## 2018-11-12 DIAGNOSIS — R928 Other abnormal and inconclusive findings on diagnostic imaging of breast: Secondary | ICD-10-CM

## 2018-11-12 DIAGNOSIS — N6320 Unspecified lump in the left breast, unspecified quadrant: Secondary | ICD-10-CM

## 2018-11-23 ENCOUNTER — Ambulatory Visit
Admission: RE | Admit: 2018-11-23 | Discharge: 2018-11-23 | Disposition: A | Discharge status: Home / Self Care | Payer: Federal, State, Local not specified - PPO | Source: Ambulatory Visit | Attending: Family Medicine | Admitting: Family Medicine

## 2018-11-23 DIAGNOSIS — R928 Other abnormal and inconclusive findings on diagnostic imaging of breast: Secondary | ICD-10-CM

## 2018-11-23 DIAGNOSIS — N6320 Unspecified lump in the left breast, unspecified quadrant: Secondary | ICD-10-CM | POA: Insufficient documentation

## 2018-11-23 IMAGING — MG MM DIGITAL DIAGNOSTIC UNILAT*L* W/ TOMO W/ CAD
6 series · 6 of 18 positions shown · non-contrast
Comparison: Previous exam(s).

CLINICAL DATA: Patient was called back screening mammogram for a
possible mass in the left breast.

EXAM:
DIGITAL DIAGNOSTIC UNILATERAL LEFT MAMMOGRAM WITH CAD AND TOMO

[L CC synth-2D]
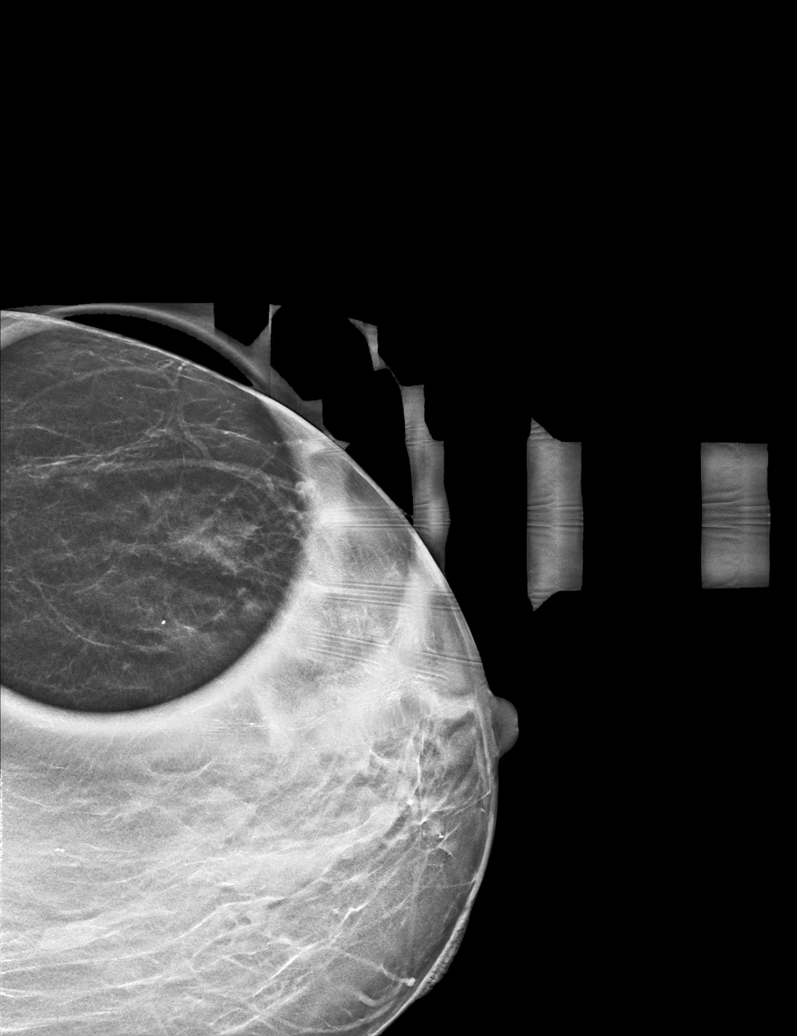

[L ML synth-2D]
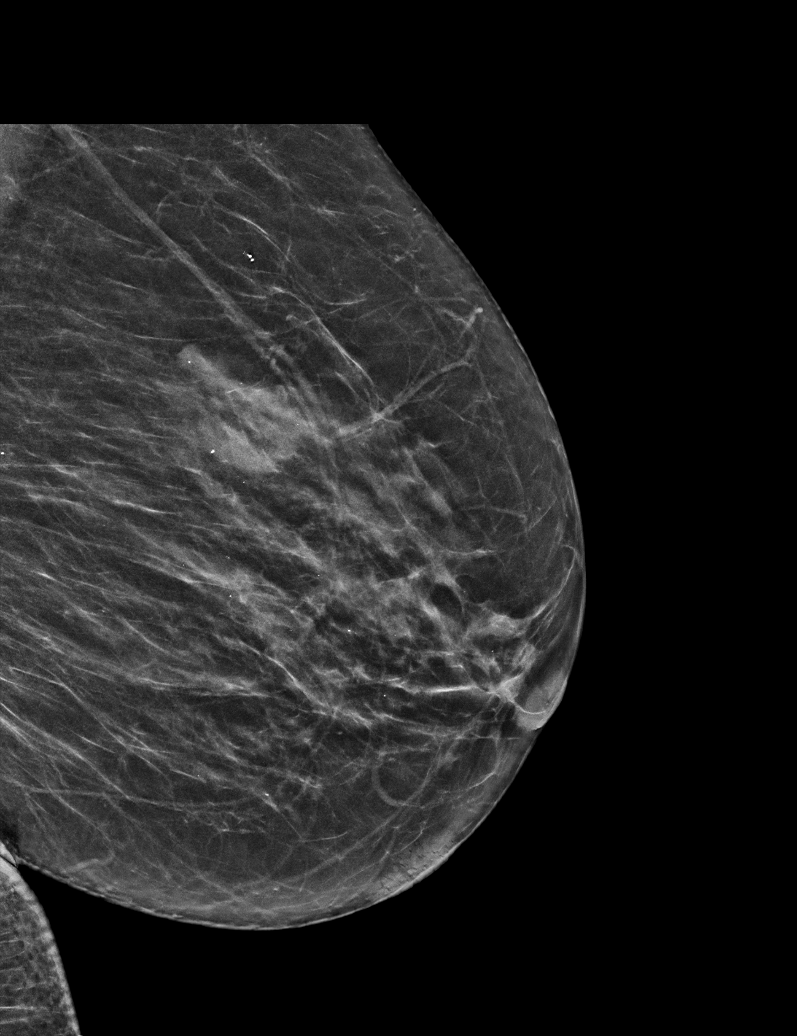

[L MLO synth-2D]
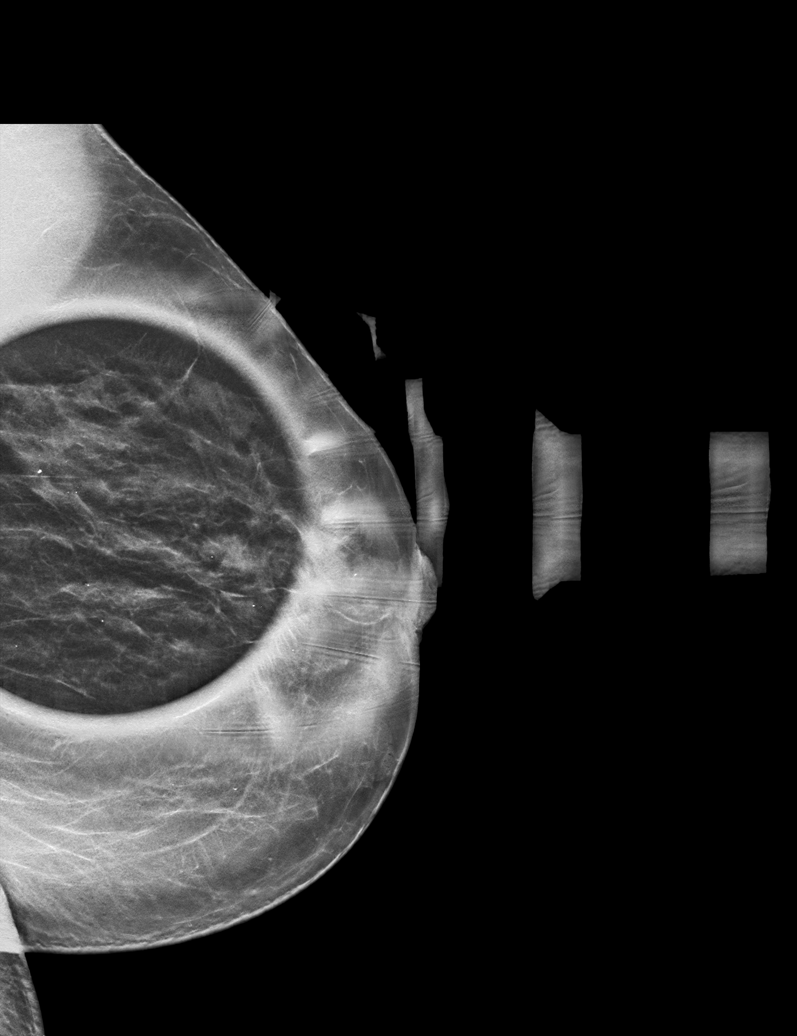

[L CC tomo · tomo slice 18/35.0]
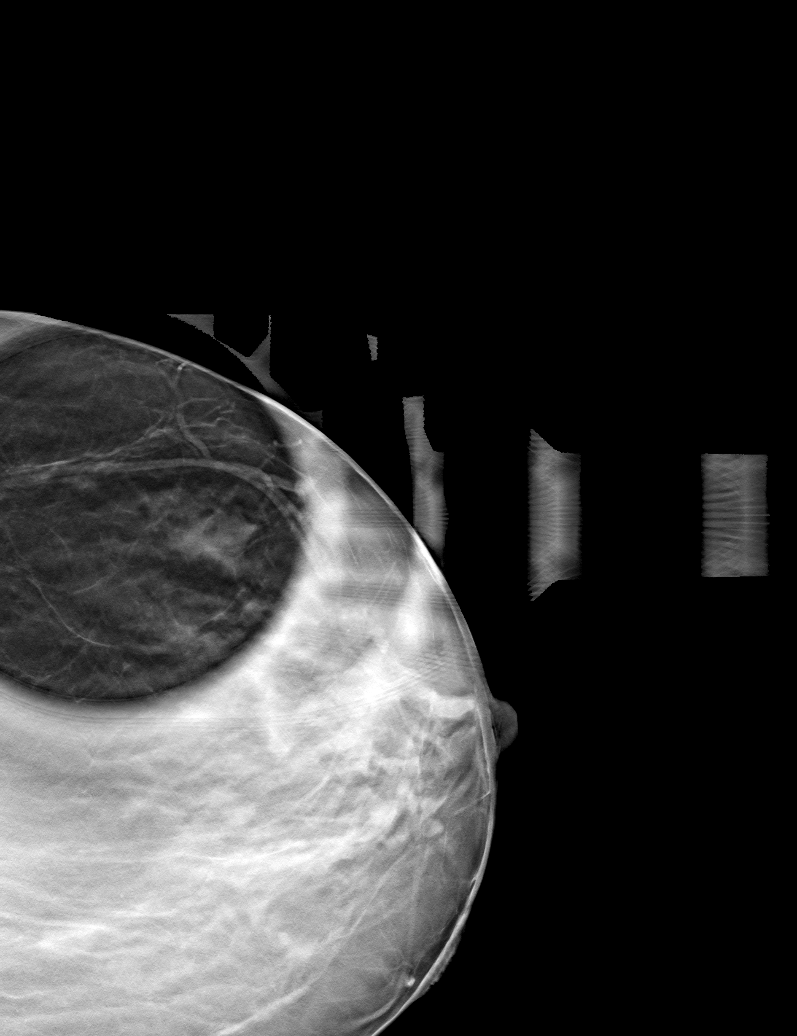

[L ML tomo · tomo slice 27/53.0]
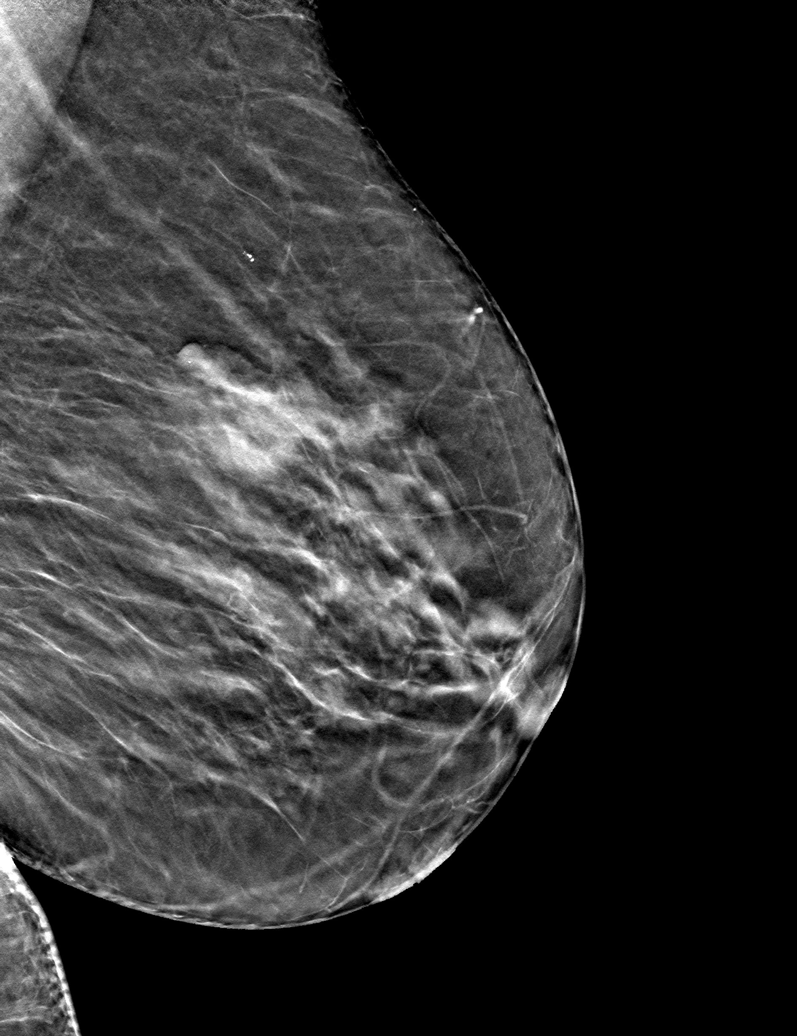

[L MLO tomo · tomo slice 21/41.0]
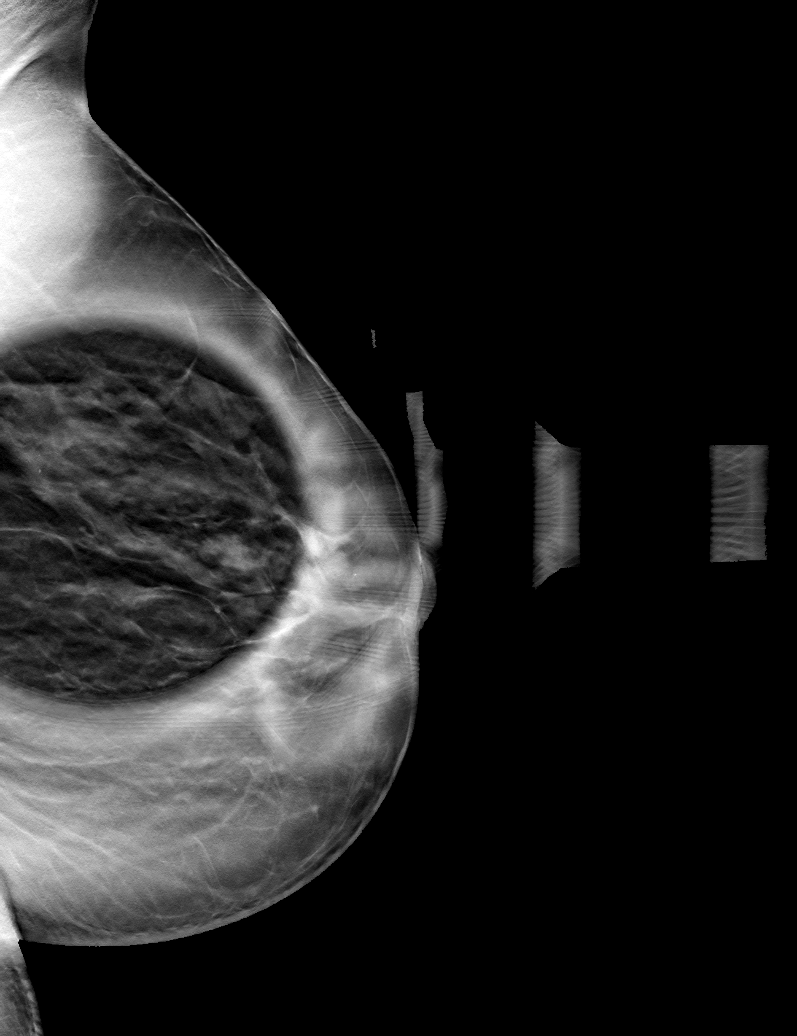

[6 of 18 positions shown; findings below may reference images not displayed]

ACR Breast Density Category b: There are scattered areas of
fibroglandular density.
FINDINGS: Additional images of the left breast was performed. No persistent
mass, distortion or malignant type microcalcifications identified.

Mammographic images were processed with CAD.
IMPRESSION: No evidence of malignancy in the left breast.

RECOMMENDATION:
Bilateral screening mammogram in 1 year is recommended.

I have discussed the findings and recommendations with the patient.
Results were also provided in writing at the conclusion of the
visit. If applicable, a reminder letter will be sent to the patient
regarding the next appointment.

BI-RADS CATEGORY  1: Negative.

## 2018-12-30 NOTE — Progress Notes (Signed)
Roberta Lane  [phone] [phone]) [phone]  ID: [name] OB: 09-09-1956  MR#: [id number]  CSN#:[id number]  Patient Care Team: Maryland Pink, MD as PCP - General (Family Medicine)  CHIEF COMPLAINT: Pancytopenia, iron deficiency anemia.  INTERVAL HISTORY: Patient returns to clinic today for repeat laboratory work and further evaluation.  Over the past several months she has noted increased weakness and fatigue as well as pica with ice chips.  She otherwise feels well.  She has no neurologic complaints.  She denies any recent fevers or illnesses.  She has a good appetite and denies weight loss.  She denies any chest pain, cough, hemoptysis, or shortness of breath.  She denies any abdominal pain. She denies any nausea, vomiting, constipation, or diarrhea.  She has no melena or hematochezia. She has no urinary complaints.    Patient offers no further specific complaints today.  REVIEW OF SYSTEMS:   Review of Systems  Constitutional: Positive for malaise/fatigue. Negative for fever and weight loss.  Respiratory: Negative.  Negative for cough, hemoptysis and shortness of breath.   Cardiovascular: Negative.  Negative for chest pain and leg swelling.  Gastrointestinal: Negative.  Negative for abdominal pain, blood in stool and melena.  Genitourinary: Negative.  Negative for dysuria and hematuria.  Musculoskeletal: Negative.  Negative for back pain.  Skin: Negative.  Negative for rash.  Neurological: Positive for weakness. Negative for sensory change and focal weakness.  Psychiatric/Behavioral: Negative.  The patient is not nervous/anxious.     As per HPI. Otherwise, a complete review of systems is negative.  PAST MEDICAL HISTORY: Past Medical History:  Diagnosis Date  . Allergic genetic state   . Aortic ejection murmur   . Depression   . Diabetes mellitus without complication ([place])   . Dysrhythmia   . GERD (gastroesophageal reflux disease)   .  Headache   . Hepatic cirrhosis ([place])   . Hepatitis C   . Hernia, abdominal   . Hyperlipidemia   . Hypertension   . Liver disease   . Palpitations   . Portal hypertension with esophageal varices (HCC)   . UTI (urinary tract infection)     PAST SURGICAL HISTORY: Past Surgical History:  Procedure Laterality Date  . ABDOMINAL HYSTERECTOMY    . ESOPHAGOGASTRODUODENOSCOPY (EGD) WITH PROPOFOL N/A 11/23/2017   Procedure: ESOPHAGOGASTRODUODENOSCOPY (EGD) WITH PROPOFOL;  Surgeon: Toledo, Benay Pike, MD;  Location: [facility] ENDOSCOPY;  Service: Gastroenterology;  Laterality: N/A;  . ESOPHAGOGASTRODUODENOSCOPY (EGD) WITH PROPOFOL N/A 10/23/2018   Procedure: ESOPHAGOGASTRODUODENOSCOPY (EGD) WITH PROPOFOL;  Surgeon: Toledo, Benay Pike, MD;  Location: [facility] ENDOSCOPY;  Service: Gastroenterology;  Laterality: N/A;  . right elbow surgery    . WISDOM TOOTH EXTRACTION      FAMILY HISTORY: Family History  Problem Relation Age of Onset  . Diabetes Mother   . Heart attack Mother   . Hypertension Father   . Thyroid disease Sister   . Sickle cell anemia Sister   . Breast cancer Neg Hx     ADVANCED DIRECTIVES (Y/N):  N  HEALTH MAINTENANCE: Social History   Tobacco Use  . Smoking status: Never Smoker  . Smokeless tobacco: Never Used  Substance Use Topics  . Alcohol use: Not Currently  . Drug use: Never     Colonoscopy:  PAP:  Bone density:  Lipid panel:  Allergies  Allergen Reactions  . Penicillins Anaphylaxis  . Amlodipine Besylate Itching  . Diphenhydramine-Acetaminophen Other (See Comments)    Made my breast hurt.  . Enalapril   .  Furosemide   . Sulfa Antibiotics     Reaction unknown.  . Tylenol [Acetaminophen] Other (See Comments)    Breast pain  . Codeine Itching  . Latex Rash    Current Outpatient Medications  Medication Sig Dispense Refill  . clobetasol ointment (TEMOVATE) 0.05 % APP TOPICALLY AA BID  1  . metFORMIN (GLUCOPHAGE) 500 MG tablet Take 1,000 mg by mouth 2  (two) times daily with a meal.     . rifaximin (XIFAXAN) 550 MG TABS tablet Take by mouth.    . spironolactone (ALDACTONE) 100 MG tablet Take 100 mg by mouth daily.     . traZODone (DESYREL) 50 MG tablet Take by mouth.    . lactulose (CHRONULAC) 10 GM/15ML solution Take 45 mLs (30 g total) by mouth 2 (two) times daily. (Patient not taking: Reported on 12/31/2018) 240 mL 0   No current facility-administered medications for this visit.     OBJECTIVE: Vitals:   12/31/18 1504  BP: 135/80  Pulse: 69  Resp: 18  Temp: (!) 97.4 F (36.3 C)     Body mass index is 30.55 kg/m.    ECOG FS:0 - Asymptomatic  General: Well-developed, well-nourished, no acute distress. Eyes: Pink conjunctiva, anicteric sclera. HEENT: Normocephalic, moist mucous membranes. Lungs: Clear to auscultation bilaterally. Heart: Regular rate and rhythm. No rubs, murmurs, or gallops. Abdomen: Soft, nontender, nondistended. No organomegaly noted, normoactive bowel sounds. Musculoskeletal: No edema, cyanosis, or clubbing. Neuro: Alert, answering all questions appropriately. Cranial nerves grossly intact. Skin: No rashes or petechiae noted. Psych: Normal affect.  LAB RESULTS:  Lab Results  Component Value Date   NA 138 07/21/2018   K 4.1 07/21/2018   CL 112 (H) 07/21/2018   CO2 18 (L) 07/21/2018   GLUCOSE 111 (H) 07/21/2018   BUN 8 07/21/2018   CREATININE 0.66 07/21/2018   CALCIUM 8.6 (L) 07/21/2018   PROT 7.9 07/20/2018   ALBUMIN 3.6 07/20/2018   AST 48 (H) 07/20/2018   ALT 27 07/20/2018   ALKPHOS 91 07/20/2018   BILITOT 2.5 (H) 07/20/2018   GFRNONAA >60 07/21/2018   GFRAA >60 07/21/2018    Lab Results  Component Value Date   WBC 3.0 (L) 12/31/2018   NEUTROABS 1.8 12/31/2018   HGB 8.8 (L) 12/31/2018   HCT 28.0 (L) 12/31/2018   MCV 78.7 (L) 12/31/2018   PLT 82 (L) 12/31/2018     STUDIES: No results found.  ASSESSMENT: Pancytopenia  PLAN:    1.  Pancytopenia: Multifactorial including cirrhosis  and splenomegaly of greater than 17 cm noted on CT scan on November 21, 2017 causing sequestration.  Patient's white blood cell count and platelet count are decreased, but relatively stable.  Her hemoglobin has trended down likely secondary to iron deficiency.  She was previously noted to have antineutrophil antibodies as well as platelet antibodies but both are likely clinically insignificant given the stability of her laboratory work.  No intervention is needed at this time.  Patient does not require bone marrow biopsy.  Return to clinic in 3 months with repeat laboratory work and further evaluation. 2.  Cirrhosis: Previously, AFP is within normal limits.  Continue follow-up with GI as indicated. 3.  GI bleed: Patient denies any recent bleeding.  Patient's most recent EGD on November 23, 2017 revealed esophageal varices.   4.  Worsening anemia: Likely secondary to iron deficiency.  Patient declined additional lab work or to return to clinic for IV Feraheme.  Will repeat labs in 3 months at visit  and proceed with Feraheme at that time.  Patient has been instructed that if her weakness and fatigue or her pica symptoms worsen to call clinic for further evaluation and initiation of Feraheme.  Patient expressed understanding and was in agreement with this plan. She also understands that She can call clinic at any time with any questions, concerns, or complaints.    Lloyd Huger, MD   12/31/2018 3:35 PM

## 2018-12-31 ENCOUNTER — Encounter (INDEPENDENT_AMBULATORY_CARE_PROVIDER_SITE_OTHER): Payer: Self-pay

## 2018-12-31 ENCOUNTER — Other Ambulatory Visit: Payer: Self-pay

## 2018-12-31 ENCOUNTER — Inpatient Hospital Stay (HOSPITAL_BASED_OUTPATIENT_CLINIC_OR_DEPARTMENT_OTHER): Payer: Federal, State, Local not specified - PPO | Admitting: Oncology

## 2018-12-31 ENCOUNTER — Inpatient Hospital Stay: Payer: Federal, State, Local not specified - PPO | Attending: Oncology

## 2018-12-31 ENCOUNTER — Encounter: Payer: Self-pay | Admitting: Oncology

## 2018-12-31 VITALS — BP 135/80 | HR 69 | Temp 97.4°F | Resp 18 | Wt 178.0 lb

## 2018-12-31 DIAGNOSIS — D61818 Other pancytopenia: Secondary | ICD-10-CM

## 2018-12-31 DIAGNOSIS — E119 Type 2 diabetes mellitus without complications: Secondary | ICD-10-CM | POA: Insufficient documentation

## 2018-12-31 DIAGNOSIS — I10 Essential (primary) hypertension: Secondary | ICD-10-CM | POA: Diagnosis not present

## 2018-12-31 DIAGNOSIS — Z7984 Long term (current) use of oral hypoglycemic drugs: Secondary | ICD-10-CM | POA: Insufficient documentation

## 2018-12-31 DIAGNOSIS — D509 Iron deficiency anemia, unspecified: Secondary | ICD-10-CM | POA: Diagnosis not present

## 2018-12-31 DIAGNOSIS — K7460 Unspecified cirrhosis of liver: Secondary | ICD-10-CM

## 2018-12-31 DIAGNOSIS — Z79899 Other long term (current) drug therapy: Secondary | ICD-10-CM | POA: Diagnosis not present

## 2018-12-31 LAB — CBC WITH DIFFERENTIAL/PLATELET
Abs Immature Granulocytes: 0.01 10*3/uL (ref 0.00–0.07)
Basophils Absolute: 0 10*3/uL (ref 0.0–0.1)
Basophils Relative: 1 %
Eosinophils Absolute: 0.1 10*3/uL (ref 0.0–0.5)
Eosinophils Relative: 3 %
HCT: 28 % — ABNORMAL LOW (ref 36.0–46.0)
Hemoglobin: 8.8 g/dL — ABNORMAL LOW (ref 12.0–15.0)
Immature Granulocytes: 0 %
Lymphocytes Relative: 24 %
Lymphs Abs: 0.7 10*3/uL (ref 0.7–4.0)
MCH: 24.7 pg — ABNORMAL LOW (ref 26.0–34.0)
MCHC: 31.4 g/dL (ref 30.0–36.0)
MCV: 78.7 fL — ABNORMAL LOW (ref 80.0–100.0)
Monocytes Absolute: 0.3 10*3/uL (ref 0.1–1.0)
Monocytes Relative: 11 %
Neutro Abs: 1.8 10*3/uL (ref 1.7–7.7)
Neutrophils Relative %: 61 %
PLATELETS: 82 10*3/uL — AB (ref 150–400)
RBC: 3.56 MIL/uL — ABNORMAL LOW (ref 3.87–5.11)
RDW: 18.3 % — ABNORMAL HIGH (ref 11.5–15.5)
WBC: 3 10*3/uL — ABNORMAL LOW (ref 4.0–10.5)
nRBC: 0 % (ref 0.0–0.2)

## 2019-01-02 ENCOUNTER — Other Ambulatory Visit: Payer: Federal, State, Local not specified - PPO

## 2019-01-02 ENCOUNTER — Ambulatory Visit: Payer: Federal, State, Local not specified - PPO | Admitting: Oncology

## 2019-01-04 ENCOUNTER — Other Ambulatory Visit: Payer: Self-pay | Admitting: Gastroenterology

## 2019-01-04 DIAGNOSIS — K7460 Unspecified cirrhosis of liver: Principal | ICD-10-CM

## 2019-01-04 DIAGNOSIS — B182 Chronic viral hepatitis C: Secondary | ICD-10-CM

## 2019-01-07 DIAGNOSIS — I350 Nonrheumatic aortic (valve) stenosis: Secondary | ICD-10-CM | POA: Insufficient documentation

## 2019-03-21 ENCOUNTER — Other Ambulatory Visit: Payer: Self-pay | Admitting: Gastroenterology

## 2019-03-21 DIAGNOSIS — B182 Chronic viral hepatitis C: Secondary | ICD-10-CM

## 2019-03-26 ENCOUNTER — Ambulatory Visit: Payer: Federal, State, Local not specified - PPO | Admitting: Oncology

## 2019-03-26 ENCOUNTER — Other Ambulatory Visit: Payer: Federal, State, Local not specified - PPO

## 2019-03-26 ENCOUNTER — Ambulatory Visit: Payer: Federal, State, Local not specified - PPO

## 2019-03-26 DIAGNOSIS — M25562 Pain in left knee: Secondary | ICD-10-CM | POA: Insufficient documentation

## 2019-03-26 DIAGNOSIS — R188 Other ascites: Secondary | ICD-10-CM | POA: Insufficient documentation

## 2019-04-03 ENCOUNTER — Ambulatory Visit: Payer: Federal, State, Local not specified - PPO

## 2019-04-05 ENCOUNTER — Other Ambulatory Visit: Payer: Self-pay

## 2019-04-05 ENCOUNTER — Ambulatory Visit
Admission: RE | Admit: 2019-04-05 | Discharge: 2019-04-05 | Disposition: A | Discharge status: Home / Self Care | Payer: Federal, State, Local not specified - PPO | Source: Ambulatory Visit | Attending: Gastroenterology | Admitting: Gastroenterology

## 2019-04-05 DIAGNOSIS — B182 Chronic viral hepatitis C: Secondary | ICD-10-CM | POA: Diagnosis not present

## 2019-04-05 DIAGNOSIS — K7460 Unspecified cirrhosis of liver: Secondary | ICD-10-CM | POA: Diagnosis present

## 2019-04-05 IMAGING — US ULTRASOUND ABDOMEN LIMITED
1 series · 14 of 25 positions shown · non-contrast
Comparison: Right upper quadrant ultrasound [DATE]. CT abdomen
and pelvis [DATE].

CLINICAL DATA: History of cirrhosis secondary to hepatitis C.

EXAM:
ULTRASOUND ABDOMEN LIMITED RIGHT UPPER QUADRANT

[Series 1: ultrasound abdomen limited · 14 of 58 slices shown]
[im 1/58]
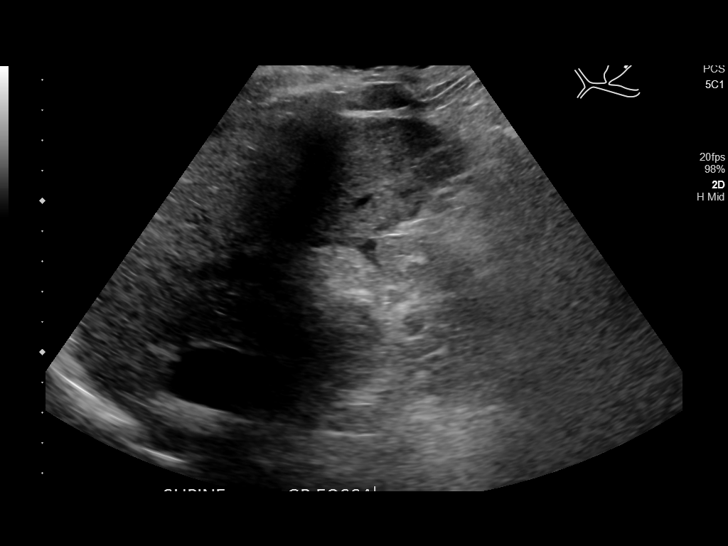
[im 5/58]
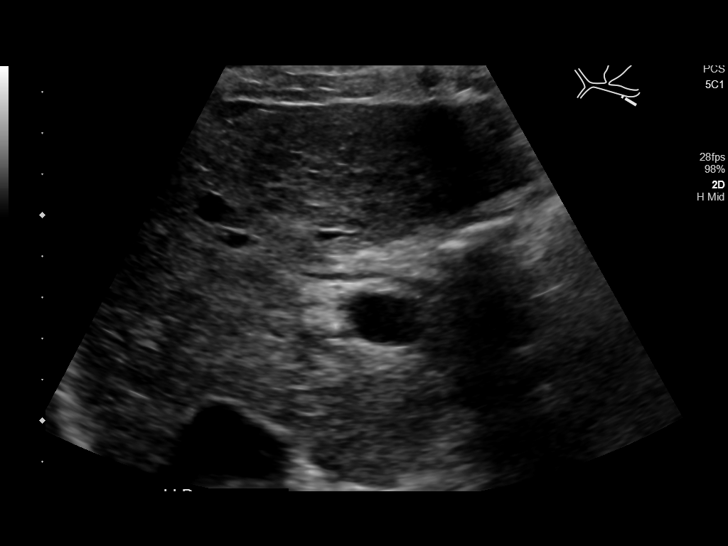
[im 10/58]
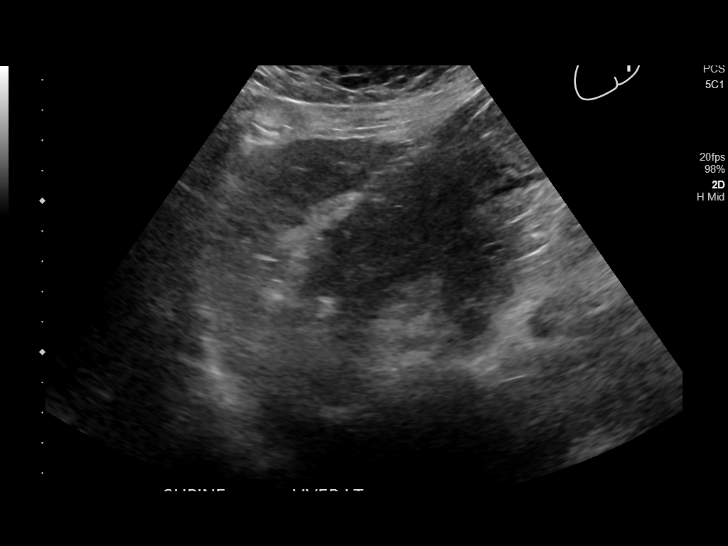
[im 15/58]
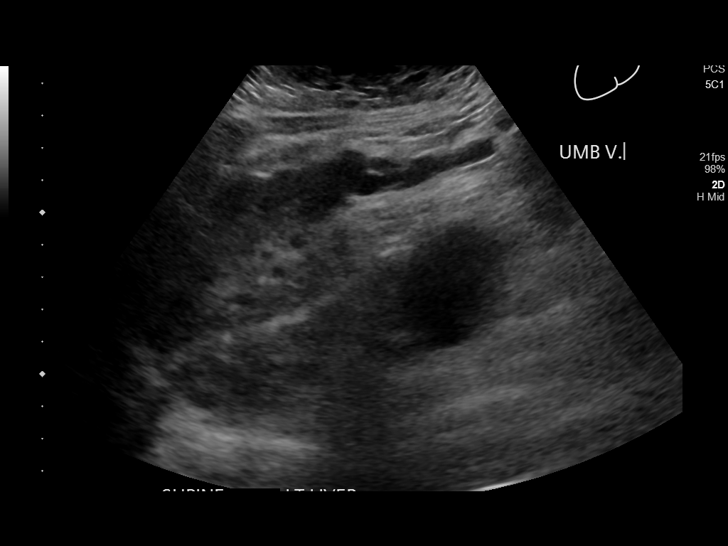
[im 20/58]
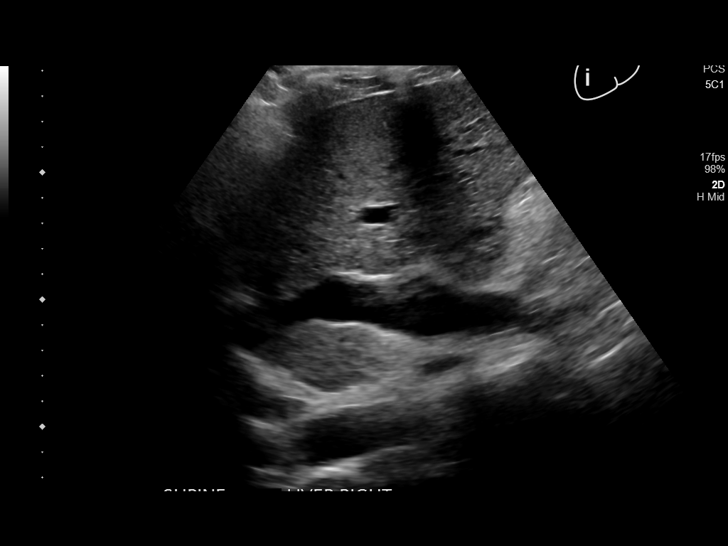
[im 22/58]
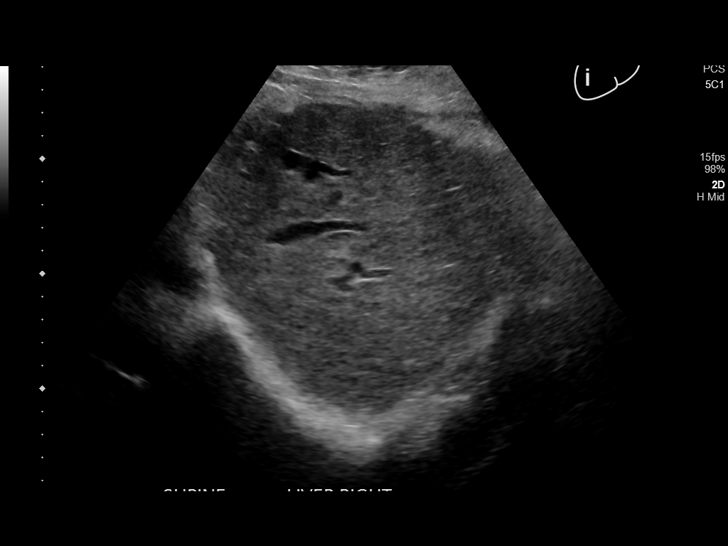
[im 27/58]
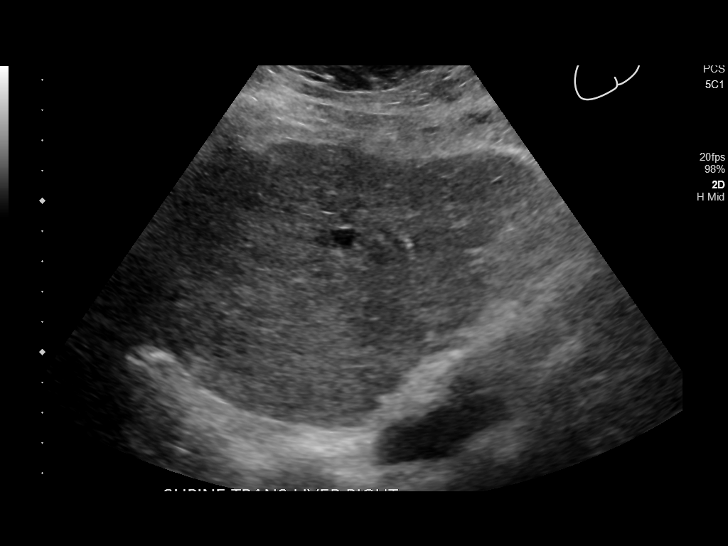
[im 31/58]
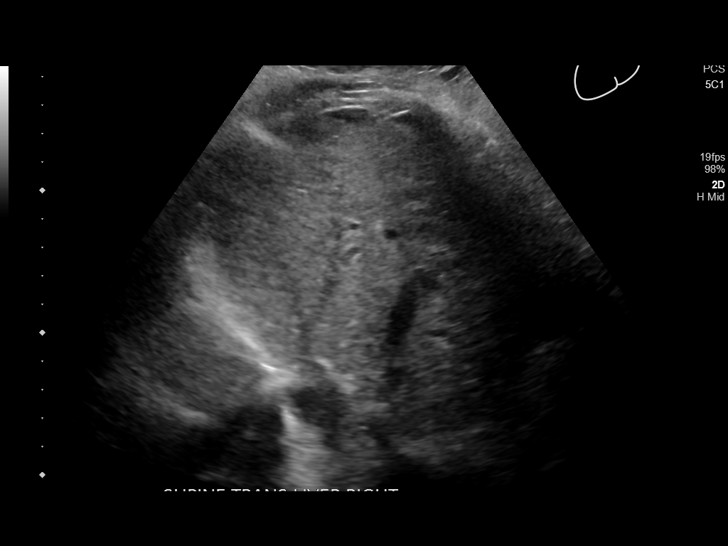
[im 36/58]
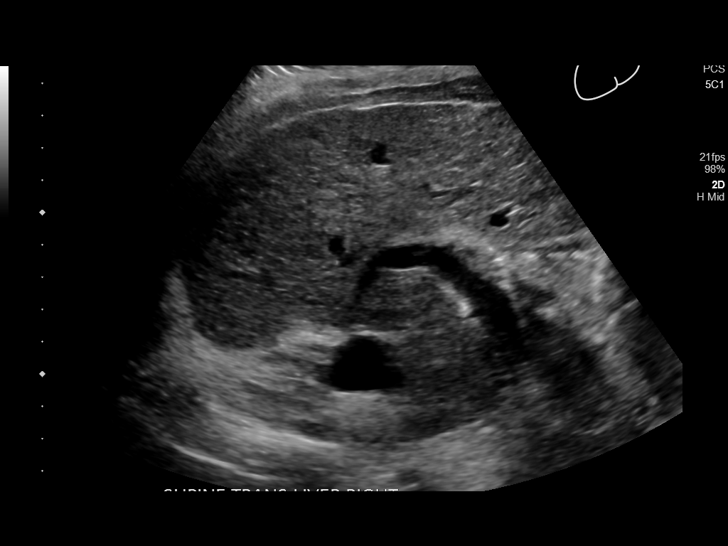
[im 39/58]
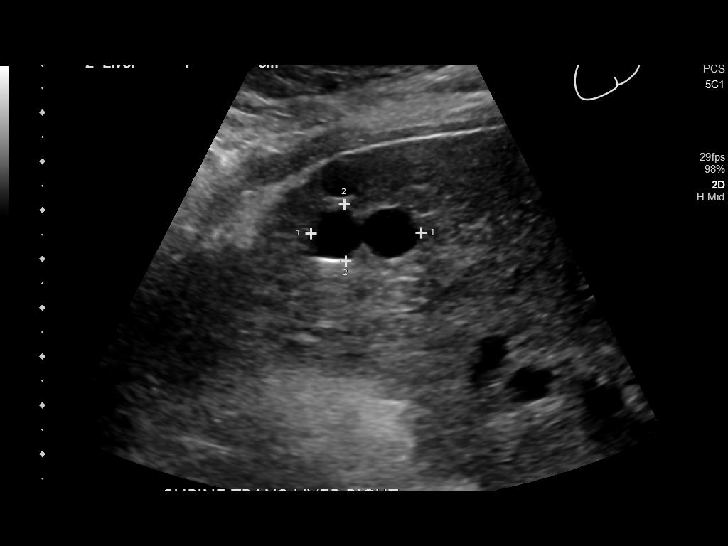
[im 43/58]
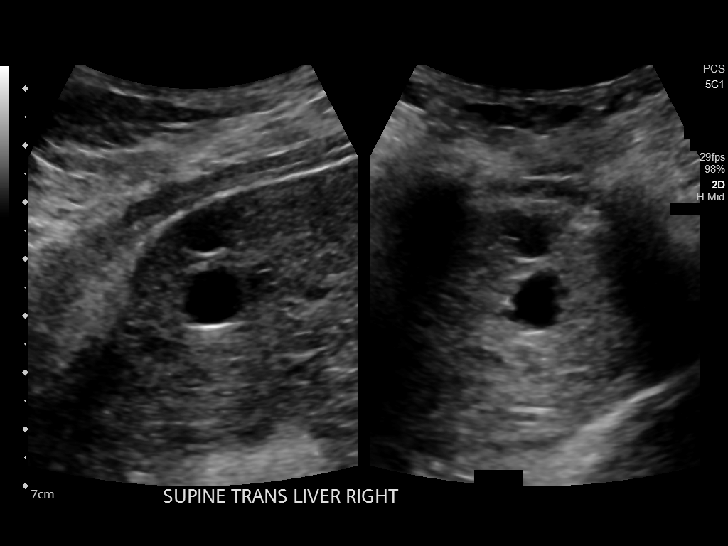
[im 48/58]
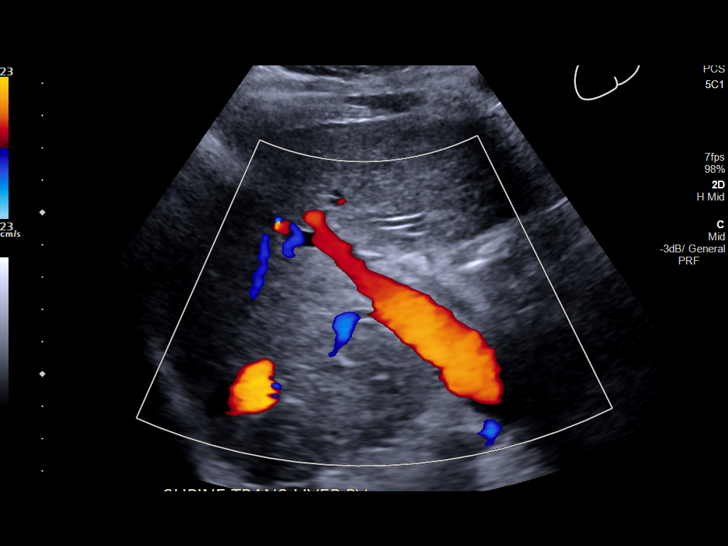
[im 53/58]
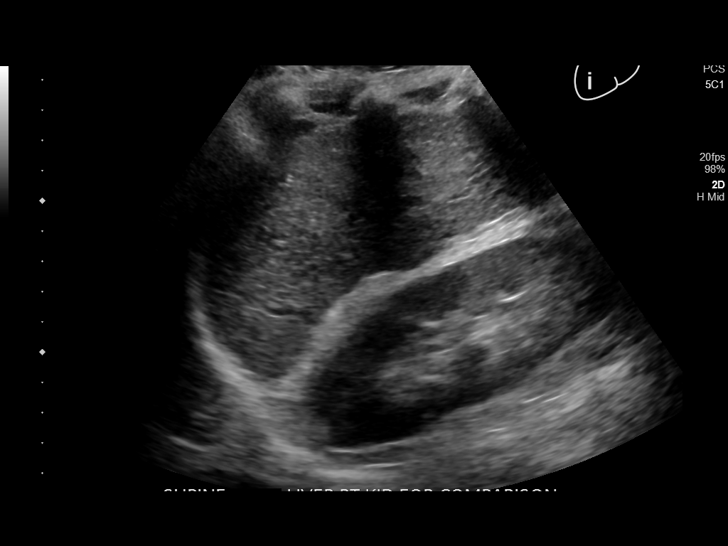
[im 58/58]
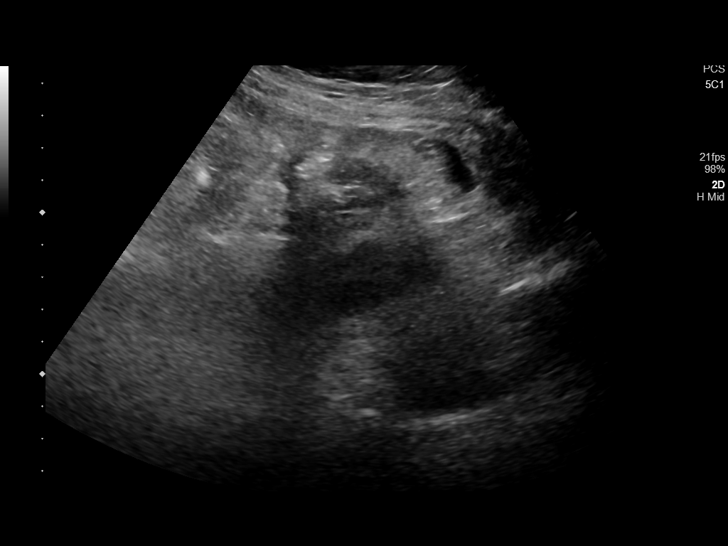

[14 of 25 positions shown; findings below may reference images not displayed]

FINDINGS: Gallbladder:

Removed.

Common bile duct:

Diameter: 0.3 cm.

Liver:

Scattered cysts are seen in the right hepatic lobe. The largest
measures 2.1 cm. The liver is shrunken with a nodular border
consistent with the patient's history of cirrhosis. Trace amount of
perihepatic ascites is noted. Portal vein is patent on color Doppler
imaging with normal direction of blood flow towards the liver.
IMPRESSION: Cirrhotic liver with a trace amount of perihepatic ascites.

Three simple liver cysts as seen on prior exams.

## 2019-06-10 ENCOUNTER — Telehealth: Payer: Self-pay | Admitting: Oncology

## 2019-06-10 NOTE — Telephone Encounter (Signed)
Tried to return PT's phone message 3 times. Somebody would answer and then hang up. Was not able to complete phone call.

## 2019-06-22 NOTE — Progress Notes (Deleted)
[place]  [phone] [phone]) [phone]  ID: [name] OB: 30-Nov-1955  MR#: [id number]  CSN#:[id number]  Patient Care Team: Maryland Pink, MD as PCP - General (Family Medicine)  CHIEF COMPLAINT: Pancytopenia, iron deficiency anemia.  INTERVAL HISTORY: Patient returns to clinic today for repeat laboratory work and further evaluation.  Over the past several months she has noted increased weakness and fatigue as well as pica with ice chips.  She otherwise feels well.  She has no neurologic complaints.  She denies any recent fevers or illnesses.  She has a good appetite and denies weight loss.  She denies any chest pain, cough, hemoptysis, or shortness of breath.  She denies any abdominal pain. She denies any nausea, vomiting, constipation, or diarrhea.  She has no melena or hematochezia. She has no urinary complaints.    Patient offers no further specific complaints today.  REVIEW OF SYSTEMS:   Review of Systems  Constitutional: Positive for malaise/fatigue. Negative for fever and weight loss.  Respiratory: Negative.  Negative for cough, hemoptysis and shortness of breath.   Cardiovascular: Negative.  Negative for chest pain and leg swelling.  Gastrointestinal: Negative.  Negative for abdominal pain, blood in stool and melena.  Genitourinary: Negative.  Negative for dysuria and hematuria.  Musculoskeletal: Negative.  Negative for back pain.  Skin: Negative.  Negative for rash.  Neurological: Positive for weakness. Negative for sensory change and focal weakness.  Psychiatric/Behavioral: Negative.  The patient is not nervous/anxious.     As per HPI. Otherwise, a complete review of systems is negative.  PAST MEDICAL HISTORY: Past Medical History:  Diagnosis Date  . Allergic genetic state   . Aortic ejection murmur   . Depression   . Diabetes mellitus without complication ([place])   . Dysrhythmia   . GERD (gastroesophageal reflux disease)   .  Headache   . Hepatic cirrhosis ([place])   . Hepatitis C   . Hernia, abdominal   . Hyperlipidemia   . Hypertension   . Liver disease   . Palpitations   . Portal hypertension with esophageal varices (HCC)   . UTI (urinary tract infection)     PAST SURGICAL HISTORY: Past Surgical History:  Procedure Laterality Date  . ABDOMINAL HYSTERECTOMY    . ESOPHAGOGASTRODUODENOSCOPY (EGD) WITH PROPOFOL N/A 11/23/2017   Procedure: ESOPHAGOGASTRODUODENOSCOPY (EGD) WITH PROPOFOL;  Surgeon: Toledo, Benay Pike, MD;  Location: [facility] ENDOSCOPY;  Service: Gastroenterology;  Laterality: N/A;  . ESOPHAGOGASTRODUODENOSCOPY (EGD) WITH PROPOFOL N/A 10/23/2018   Procedure: ESOPHAGOGASTRODUODENOSCOPY (EGD) WITH PROPOFOL;  Surgeon: Toledo, Benay Pike, MD;  Location: [facility] ENDOSCOPY;  Service: Gastroenterology;  Laterality: N/A;  . right elbow surgery    . WISDOM TOOTH EXTRACTION      FAMILY HISTORY: Family History  Problem Relation Age of Onset  . Diabetes Mother   . Heart attack Mother   . Hypertension Father   . Thyroid disease Sister   . Sickle cell anemia Sister   . Breast cancer Neg Hx     ADVANCED DIRECTIVES (Y/N):  N  HEALTH MAINTENANCE: Social History   Tobacco Use  . Smoking status: Never Smoker  . Smokeless tobacco: Never Used  Substance Use Topics  . Alcohol use: Not Currently  . Drug use: Never     Colonoscopy:  PAP:  Bone density:  Lipid panel:  Allergies  Allergen Reactions  . Penicillins Anaphylaxis  . Amlodipine Besylate Itching  . Diphenhydramine-Acetaminophen Other (See Comments)    Made my breast hurt.  . Enalapril   .  Furosemide   . Sulfa Antibiotics     Reaction unknown.  . Tylenol [Acetaminophen] Other (See Comments)    Breast pain  . Codeine Itching  . Latex Rash    Current Outpatient Medications  Medication Sig Dispense Refill  . clobetasol ointment (TEMOVATE) 0.05 % APP TOPICALLY AA BID  1  . lactulose (CHRONULAC) 10 GM/15ML solution Take 45 mLs (30 g total)  by mouth 2 (two) times daily. (Patient not taking: Reported on 12/31/2018) 240 mL 0  . metFORMIN (GLUCOPHAGE) 500 MG tablet Take 1,000 mg by mouth 2 (two) times daily with a meal.     . rifaximin (XIFAXAN) 550 MG TABS tablet Take by mouth.    . spironolactone (ALDACTONE) 100 MG tablet Take 100 mg by mouth daily.      No current facility-administered medications for this visit.     OBJECTIVE: There were no vitals filed for this visit.   There is no height or weight on file to calculate BMI.    ECOG FS:0 - Asymptomatic  General: Well-developed, well-nourished, no acute distress. Eyes: Pink conjunctiva, anicteric sclera. HEENT: Normocephalic, moist mucous membranes. Lungs: Clear to auscultation bilaterally. Heart: Regular rate and rhythm. No rubs, murmurs, or gallops. Abdomen: Soft, nontender, nondistended. No organomegaly noted, normoactive bowel sounds. Musculoskeletal: No edema, cyanosis, or clubbing. Neuro: Alert, answering all questions appropriately. Cranial nerves grossly intact. Skin: No rashes or petechiae noted. Psych: Normal affect.  LAB RESULTS:  Lab Results  Component Value Date   NA 138 07/21/2018   K 4.1 07/21/2018   CL 112 (H) 07/21/2018   CO2 18 (L) 07/21/2018   GLUCOSE 111 (H) 07/21/2018   BUN 8 07/21/2018   CREATININE 0.66 07/21/2018   CALCIUM 8.6 (L) 07/21/2018   PROT 7.9 07/20/2018   ALBUMIN 3.6 07/20/2018   AST 48 (H) 07/20/2018   ALT 27 07/20/2018   ALKPHOS 91 07/20/2018   BILITOT 2.5 (H) 07/20/2018   GFRNONAA >60 07/21/2018   GFRAA >60 07/21/2018    Lab Results  Component Value Date   WBC 3.0 (L) 12/31/2018   NEUTROABS 1.8 12/31/2018   HGB 8.8 (L) 12/31/2018   HCT 28.0 (L) 12/31/2018   MCV 78.7 (L) 12/31/2018   PLT 82 (L) 12/31/2018     STUDIES: No results found.  ASSESSMENT: Pancytopenia  PLAN:    1.  Pancytopenia: Multifactorial including cirrhosis and splenomegaly of greater than 17 cm noted on CT scan on November 21, 2017 causing  sequestration.  Patient's white blood cell count and platelet count are decreased, but relatively stable.  Her hemoglobin has trended down likely secondary to iron deficiency.  She was previously noted to have antineutrophil antibodies as well as platelet antibodies but both are likely clinically insignificant given the stability of her laboratory work.  No intervention is needed at this time.  Patient does not require bone marrow biopsy.  Return to clinic in 3 months with repeat laboratory work and further evaluation. 2.  Cirrhosis: Previously, AFP is within normal limits.  Continue follow-up with GI as indicated. 3.  GI bleed: Patient denies any recent bleeding.  Patient's most recent EGD on November 23, 2017 revealed esophageal varices.   4.  Worsening anemia: Likely secondary to iron deficiency.  Patient declined additional lab work or to return to clinic for IV Feraheme.  Will repeat labs in 3 months at visit and proceed with Feraheme at that time.  Patient has been instructed that if her weakness and fatigue or her pica symptoms  worsen to call clinic for further evaluation and initiation of Feraheme.  Patient expressed understanding and was in agreement with this plan. She also understands that She can call clinic at any time with any questions, concerns, or complaints.    Lloyd Huger, MD   06/22/2019 7:56 AM

## 2019-06-27 ENCOUNTER — Inpatient Hospital Stay: Payer: Federal, State, Local not specified - PPO

## 2019-06-27 ENCOUNTER — Inpatient Hospital Stay: Payer: Federal, State, Local not specified - PPO | Admitting: Oncology

## 2019-06-27 ENCOUNTER — Encounter: Payer: Self-pay | Admitting: Oncology

## 2019-07-19 ENCOUNTER — Inpatient Hospital Stay: Payer: Federal, State, Local not specified - PPO | Attending: Oncology

## 2019-07-19 ENCOUNTER — Inpatient Hospital Stay: Payer: Federal, State, Local not specified - PPO

## 2019-07-19 ENCOUNTER — Other Ambulatory Visit: Payer: Self-pay

## 2019-07-19 ENCOUNTER — Encounter (INDEPENDENT_AMBULATORY_CARE_PROVIDER_SITE_OTHER): Payer: Self-pay

## 2019-07-19 ENCOUNTER — Encounter: Payer: Self-pay | Admitting: Oncology

## 2019-07-19 ENCOUNTER — Inpatient Hospital Stay (HOSPITAL_BASED_OUTPATIENT_CLINIC_OR_DEPARTMENT_OTHER): Payer: Federal, State, Local not specified - PPO | Admitting: Oncology

## 2019-07-19 VITALS — BP 135/75 | HR 73 | Temp 97.9°F | Resp 20

## 2019-07-19 VITALS — BP 144/79 | HR 75 | Temp 98.2°F | Ht 64.0 in | Wt 176.0 lb

## 2019-07-19 DIAGNOSIS — F5089 Other specified eating disorder: Secondary | ICD-10-CM | POA: Diagnosis not present

## 2019-07-19 DIAGNOSIS — I10 Essential (primary) hypertension: Secondary | ICD-10-CM | POA: Insufficient documentation

## 2019-07-19 DIAGNOSIS — K766 Portal hypertension: Secondary | ICD-10-CM | POA: Diagnosis not present

## 2019-07-19 DIAGNOSIS — K7460 Unspecified cirrhosis of liver: Secondary | ICD-10-CM

## 2019-07-19 DIAGNOSIS — D61818 Other pancytopenia: Secondary | ICD-10-CM | POA: Insufficient documentation

## 2019-07-19 DIAGNOSIS — R5382 Chronic fatigue, unspecified: Secondary | ICD-10-CM | POA: Insufficient documentation

## 2019-07-19 DIAGNOSIS — E119 Type 2 diabetes mellitus without complications: Secondary | ICD-10-CM | POA: Insufficient documentation

## 2019-07-19 DIAGNOSIS — Z8719 Personal history of other diseases of the digestive system: Secondary | ICD-10-CM | POA: Insufficient documentation

## 2019-07-19 DIAGNOSIS — R531 Weakness: Secondary | ICD-10-CM | POA: Diagnosis not present

## 2019-07-19 DIAGNOSIS — E785 Hyperlipidemia, unspecified: Secondary | ICD-10-CM | POA: Diagnosis not present

## 2019-07-19 DIAGNOSIS — Z7984 Long term (current) use of oral hypoglycemic drugs: Secondary | ICD-10-CM | POA: Diagnosis not present

## 2019-07-19 DIAGNOSIS — D509 Iron deficiency anemia, unspecified: Secondary | ICD-10-CM

## 2019-07-19 DIAGNOSIS — R5381 Other malaise: Secondary | ICD-10-CM | POA: Insufficient documentation

## 2019-07-19 DIAGNOSIS — K219 Gastro-esophageal reflux disease without esophagitis: Secondary | ICD-10-CM | POA: Insufficient documentation

## 2019-07-19 DIAGNOSIS — Z79899 Other long term (current) drug therapy: Secondary | ICD-10-CM | POA: Diagnosis not present

## 2019-07-19 LAB — CBC WITH DIFFERENTIAL/PLATELET
Abs Immature Granulocytes: 0 10*3/uL (ref 0.00–0.07)
Basophils Absolute: 0 10*3/uL (ref 0.0–0.1)
Basophils Relative: 1 %
Eosinophils Absolute: 0.1 10*3/uL (ref 0.0–0.5)
Eosinophils Relative: 4 %
HCT: 28.8 % — ABNORMAL LOW (ref 36.0–46.0)
Hemoglobin: 8.9 g/dL — ABNORMAL LOW (ref 12.0–15.0)
Immature Granulocytes: 0 %
Lymphocytes Relative: 22 %
Lymphs Abs: 0.6 10*3/uL — ABNORMAL LOW (ref 0.7–4.0)
MCH: 24.1 pg — ABNORMAL LOW (ref 26.0–34.0)
MCHC: 30.9 g/dL (ref 30.0–36.0)
MCV: 77.8 fL — ABNORMAL LOW (ref 80.0–100.0)
Monocytes Absolute: 0.2 10*3/uL (ref 0.1–1.0)
Monocytes Relative: 9 %
Neutro Abs: 1.7 10*3/uL (ref 1.7–7.7)
Neutrophils Relative %: 64 %
Platelets: 86 10*3/uL — ABNORMAL LOW (ref 150–400)
RBC: 3.7 MIL/uL — ABNORMAL LOW (ref 3.87–5.11)
RDW: 19.5 % — ABNORMAL HIGH (ref 11.5–15.5)
WBC: 2.6 10*3/uL — ABNORMAL LOW (ref 4.0–10.5)
nRBC: 0 % (ref 0.0–0.2)

## 2019-07-19 LAB — FERRITIN: Ferritin: 8 ng/mL — ABNORMAL LOW (ref 11–307)

## 2019-07-19 LAB — IRON AND TIBC
Iron: 33 ug/dL (ref 28–170)
Saturation Ratios: 6 % — ABNORMAL LOW (ref 10.4–31.8)
TIBC: 531 ug/dL — ABNORMAL HIGH (ref 250–450)
UIBC: 498 ug/dL

## 2019-07-19 MED ORDER — SODIUM CHLORIDE 0.9 % IV SOLN
Freq: Once | INTRAVENOUS | Status: AC
  Administered 2019-07-19: 14:00:00 via INTRAVENOUS
  Filled 2019-07-19: qty 250

## 2019-07-19 MED ORDER — SODIUM CHLORIDE 0.9 % IV SOLN
510.0000 mg | Freq: Once | INTRAVENOUS | Status: AC
  Administered 2019-07-19: 510 mg via INTRAVENOUS
  Filled 2019-07-19: qty 17

## 2019-07-19 NOTE — Progress Notes (Signed)
Patient stated that she had been doing well with no complaints. 

## 2019-07-20 LAB — AFP TUMOR MARKER: AFP, Serum, Tumor Marker: 2.6 ng/mL (ref 0.0–8.3)

## 2019-07-20 NOTE — Progress Notes (Signed)
[place]  Telephone:(336) [phone] Fax:(336) [phone]  ID: Roberta Lane OB: 1956/09/30  MR#: [id number]  CSN#:[id number]  Patient Care Team: Maryland Pink, MD as PCP - General (Family Medicine)  CHIEF COMPLAINT: Pancytopenia, iron deficiency anemia.  INTERVAL HISTORY: Patient returns to clinic today for repeat laboratory, further evaluation, and consideration of IV Feraheme.  She continues to have chronic weakness and fatigue as well as pica.  She otherwise feels well. She has no neurologic complaints.  She denies any recent fevers or illnesses.  She has a good appetite and denies weight loss.  She denies any chest pain, cough, hemoptysis, or shortness of breath.  She denies any abdominal pain. She denies any nausea, vomiting, constipation, or diarrhea.  She has no melena or hematochezia. She has no urinary complaints.  Patient offers no further specific complaints today.  REVIEW OF SYSTEMS:   Review of Systems  Constitutional: Positive for malaise/fatigue. Negative for fever and weight loss.  Respiratory: Negative.  Negative for cough, hemoptysis and shortness of breath.   Cardiovascular: Negative.  Negative for chest pain and leg swelling.  Gastrointestinal: Negative.  Negative for abdominal pain, blood in stool and melena.  Genitourinary: Negative.  Negative for dysuria and hematuria.  Musculoskeletal: Negative.  Negative for back pain.  Skin: Negative.  Negative for rash.  Neurological: Positive for weakness. Negative for sensory change and focal weakness.  Psychiatric/Behavioral: Negative.  The patient is not nervous/anxious.     As per HPI. Otherwise, a complete review of systems is negative.  PAST MEDICAL HISTORY: Past Medical History:  Diagnosis Date  . Allergic genetic state   . Aortic ejection murmur   . Depression   . Diabetes mellitus without complication ([place])   . Dysrhythmia   . GERD (gastroesophageal reflux disease)   . Headache   .  Hepatic cirrhosis ([place])   . Hepatitis C   . Hernia, abdominal   . Hyperlipidemia   . Hypertension   . Liver disease   . Palpitations   . Portal hypertension with esophageal varices (HCC)   . UTI (urinary tract infection)     PAST SURGICAL HISTORY: Past Surgical History:  Procedure Laterality Date  . ABDOMINAL HYSTERECTOMY    . ESOPHAGOGASTRODUODENOSCOPY (EGD) WITH PROPOFOL N/A 11/23/2017   Procedure: ESOPHAGOGASTRODUODENOSCOPY (EGD) WITH PROPOFOL;  Surgeon: Toledo, Benay Pike, MD;  Location: [facility] END[facility]COPY;  Service: Gastroenterology;  Laterality: N/A;  . ESOPHAGOGASTRODUODENOSCOPY (EGD) WITH PROPOFOL N/A 10/23/2018   Procedure: ESOPHAGOGASTRODUODENOSCOPY (EGD) WITH PROPOFOL;  Surgeon: Toledo, Benay Pike, MD;  Location: [facility] END[facility]COPY;  Service: Gastroenterology;  Laterality: N/A;  . right elbow surgery    . WISDOM TOOTH EXTRACTION      FAMILY HISTORY: Family History  Problem Relation Age of Onset  . Diabetes Mother   . Heart attack Mother   . Hypertension Father   . Thyroid disease Sister   . Sickle cell anemia Sister   . Breast cancer Neg Hx     ADVANCED DIRECTIVES (Y/N):  N  HEALTH MAINTENANCE: Social History   Tobacco Use  . Smoking status: Never Smoker  . Smokeless tobacco: Never Used  Substance Use Topics  . Alcohol use: Not Currently  . Drug use: Never     Colonoscopy:  PAP:  Bone density:  Lipid panel:  Allergies  Allergen Reactions  . Penicillins Anaphylaxis  . Amlodipine Besylate Itching  . Diphenhydramine-Acetaminophen Other (See Comments)    Made my breast hurt.  . Enalapril   . Furosemide   . Sulfa  Antibiotics     Reaction unknown.  . Tylenol [Acetaminophen] Other (See Comments)    Breast pain  . Codeine Itching  . Latex Rash    Current Outpatient Medications  Medication Sig Dispense Refill  . clobetasol ointment (TEMOVATE) 0.05 % APP TOPICALLY AA BID  1  . metFORMIN (GLUCOPHAGE) 500 MG tablet Take 1,000 mg by mouth 2 (two) times daily  with a meal.     . rifaximin (XIFAXAN) 550 MG TABS tablet Take by mouth.    . spironolactone (ALDACTONE) 100 MG tablet Take 100 mg by mouth daily.     Marland Kitchen lactulose (CHRONULAC) 10 GM/15ML solution Take 45 mLs (30 g total) by mouth 2 (two) times daily. (Patient not taking: Reported on 07/19/2019) 240 mL 0   No current facility-administered medications for this visit.     OBJECTIVE: Vitals:   07/19/19 1325  BP: (!) 144/79  Pulse: 75  Temp: 98.2 F (36.8 C)     Body mass index is 30.21 kg/m.    ECOG FS:0 - Asymptomatic  General: Well-developed, well-nourished, no acute distress. Eyes: Pink conjunctiva, anicteric sclera. HEENT: Normocephalic, moist mucous membranes. Lungs: Clear to auscultation bilaterally. Heart: Regular rate and rhythm. No rubs, murmurs, or gallops. Abdomen: Soft, nontender, nondistended. No organomegaly noted, normoactive bowel sounds. Musculoskeletal: No edema, cyanosis, or clubbing. Neuro: Alert, answering all questions appropriately. Cranial nerves grossly intact. Skin: No rashes or petechiae noted. Psych: Normal affect.  LAB RESULTS:  Lab Results  Component Value Date   NA 138 07/21/2018   K 4.1 07/21/2018   CL 112 (H) 07/21/2018   CO2 18 (L) 07/21/2018   GLUCOSE 111 (H) 07/21/2018   BUN 8 07/21/2018   CREATININE 0.66 07/21/2018   CALCIUM 8.6 (L) 07/21/2018   PROT 7.9 07/20/2018   ALBUMIN 3.6 07/20/2018   AST 48 (H) 07/20/2018   ALT 27 07/20/2018   ALKPHOS 91 07/20/2018   BILITOT 2.5 (H) 07/20/2018   GFRNONAA >60 07/21/2018   GFRAA >60 07/21/2018    Lab Results  Component Value Date   WBC 2.6 (L) 07/19/2019   NEUTROABS 1.7 07/19/2019   HGB 8.9 (L) 07/19/2019   HCT 28.8 (L) 07/19/2019   MCV 77.8 (L) 07/19/2019   PLT 86 (L) 07/19/2019   Lab Results  Component Value Date   IRON 33 07/19/2019   TIBC 531 (H) 07/19/2019   IRONPCTSAT 6 (L) 07/19/2019   Lab Results  Component Value Date   FERRITIN 8 (L) 07/19/2019     STUDIES: No  results found.  ASSESSMENT: Pancytopenia  PLAN:    1.  Pancytopenia: Multifactorial including cirrhosis and splenomegaly of greater than 17 cm noted on CT scan on November 21, 2017 causing sequestration.  Patient's white blood cell count and platelet count remain decreased, but a relatively stable.  Hemoglobin is decreased to 8.9 and she has significantly reduced iron stores.  She was previously noted to have antineutrophil antibodies as well as platelet antibodies but both are likely clinically insignificant given the stability of her laboratory work.  No intervention is needed at this time.  Patient does not require bone marrow biopsy.  2.  Cirrhosis: AFP continues to be within normal limits at 2.6.  Continue follow-up with GI as indicated. 3.  GI bleed: Patient denies any recent bleeding.  Patient's most recent EGD on October 23, 2018 revealed grade 2 esophageal varices and portal hypertensive gastropathy.  Recommendation was to repeat endoscopy in 1 year. 4.  Iron deficiency anemia: Patient's hemoglobin  and iron stores are significantly decreased and she is symptomatic.  Proceed with 510 mg IV Feraheme today and return to clinic in 1 week for second infusion.  Patient will then return to clinic in 3 months with repeat laboratory work, further evaluation, and consideration of additional treatment.    Patient expressed understanding and was in agreement with this plan. She also understands that She can call clinic at any time with any questions, concerns, or complaints.    Lloyd Huger, MD   07/20/2019 8:37 AM

## 2019-07-25 ENCOUNTER — Inpatient Hospital Stay: Payer: Federal, State, Local not specified - PPO | Attending: Oncology

## 2019-07-25 DIAGNOSIS — D509 Iron deficiency anemia, unspecified: Secondary | ICD-10-CM | POA: Insufficient documentation

## 2019-07-29 ENCOUNTER — Inpatient Hospital Stay: Payer: Federal, State, Local not specified - PPO

## 2019-07-29 ENCOUNTER — Other Ambulatory Visit: Payer: Self-pay

## 2019-07-29 VITALS — BP 133/73 | HR 74 | Temp 99.2°F | Resp 18

## 2019-07-29 DIAGNOSIS — D509 Iron deficiency anemia, unspecified: Secondary | ICD-10-CM

## 2019-07-29 MED ORDER — SODIUM CHLORIDE 0.9 % IV SOLN
Freq: Once | INTRAVENOUS | Status: AC
  Administered 2019-07-29: 14:00:00 via INTRAVENOUS
  Filled 2019-07-29: qty 250

## 2019-07-29 MED ORDER — SODIUM CHLORIDE 0.9 % IV SOLN
510.0000 mg | Freq: Once | INTRAVENOUS | Status: AC
  Administered 2019-07-29: 510 mg via INTRAVENOUS
  Filled 2019-07-29: qty 17

## 2019-10-03 ENCOUNTER — Other Ambulatory Visit: Payer: Self-pay | Admitting: Gastroenterology

## 2019-10-03 DIAGNOSIS — I8500 Esophageal varices without bleeding: Secondary | ICD-10-CM

## 2019-10-03 DIAGNOSIS — K766 Portal hypertension: Secondary | ICD-10-CM

## 2019-10-03 DIAGNOSIS — R1084 Generalized abdominal pain: Secondary | ICD-10-CM

## 2019-10-04 ENCOUNTER — Ambulatory Visit
Admission: RE | Admit: 2019-10-04 | Discharge: 2019-10-04 | Disposition: A | Discharge status: Home / Self Care | Payer: Federal, State, Local not specified - PPO | Source: Ambulatory Visit | Attending: Gastroenterology | Admitting: Gastroenterology

## 2019-10-04 ENCOUNTER — Other Ambulatory Visit: Payer: Self-pay

## 2019-10-04 DIAGNOSIS — K766 Portal hypertension: Secondary | ICD-10-CM | POA: Diagnosis present

## 2019-10-04 DIAGNOSIS — I8500 Esophageal varices without bleeding: Secondary | ICD-10-CM | POA: Diagnosis present

## 2019-10-04 DIAGNOSIS — R1084 Generalized abdominal pain: Secondary | ICD-10-CM | POA: Diagnosis not present

## 2019-10-04 IMAGING — US US ABDOMEN COMPLETE
1 series · 13 of 25 positions shown · non-contrast
Comparison: None.

CLINICAL DATA: History of cirrhosis.  Abdominal pain.

EXAM:
ABDOMEN ULTRASOUND COMPLETE

[Series 1: us abdomen complete · 0.19mm/px · 13 of 78 slices shown]
[im 1/78]
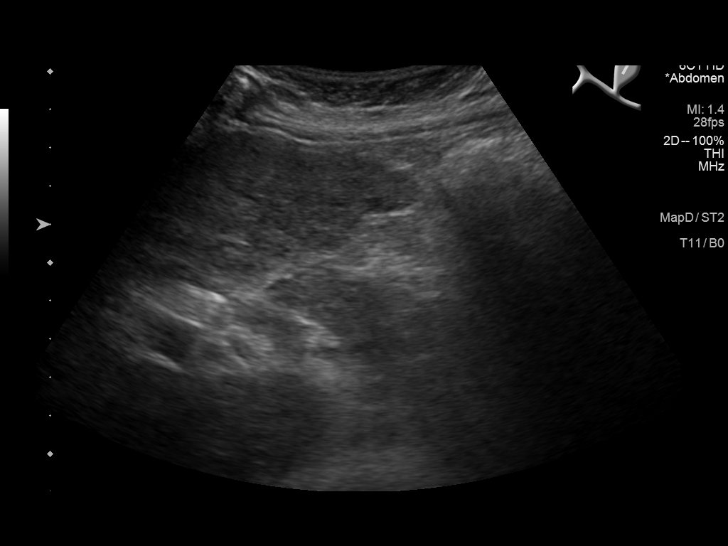
[im 7/78]
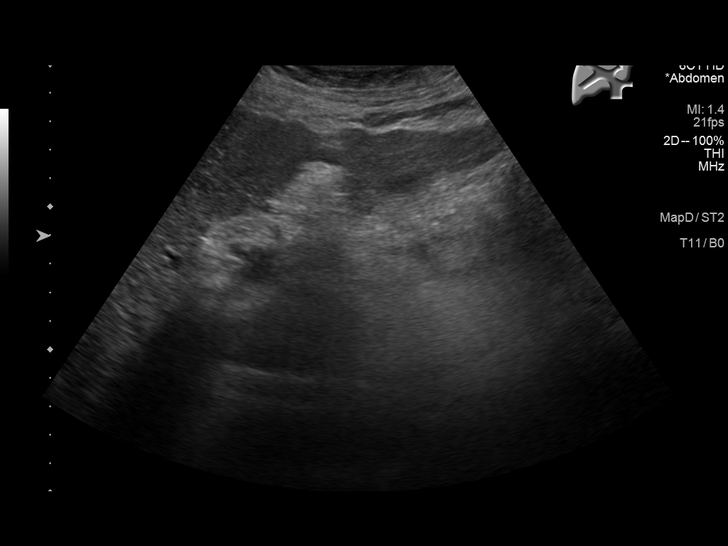
[im 13/78]
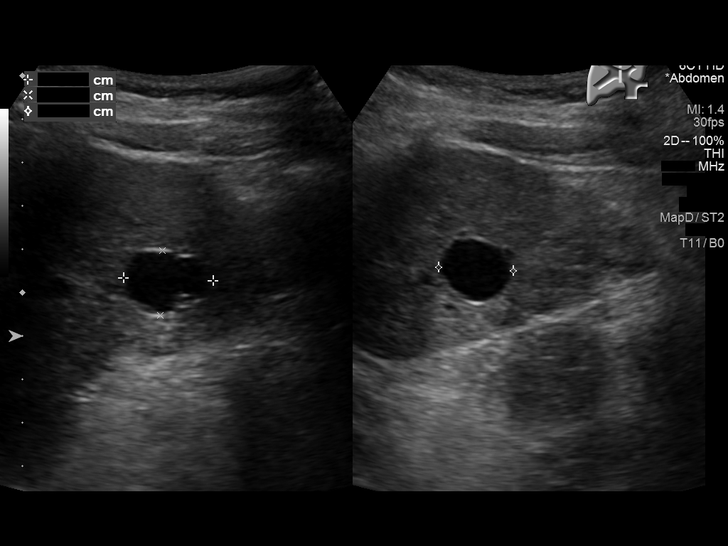
[im 20/78]
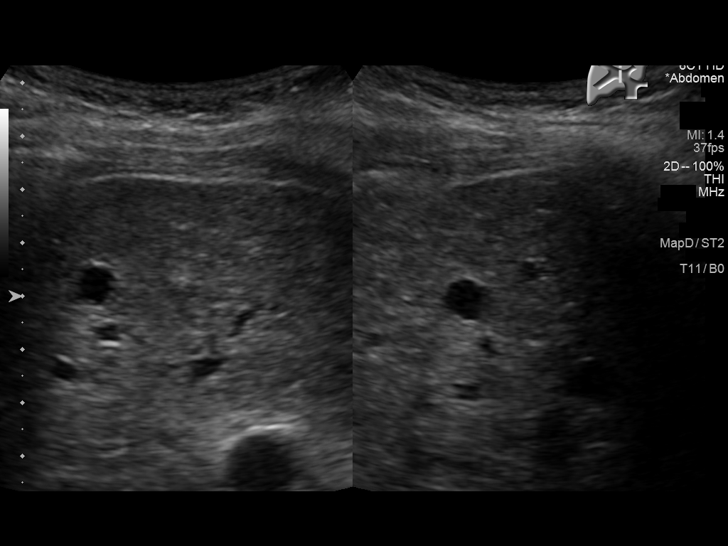
[im 26/78]
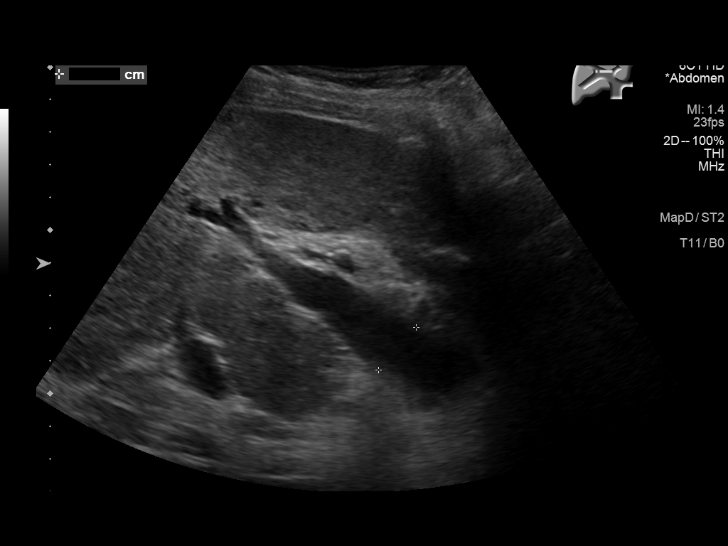
[im 33/78]
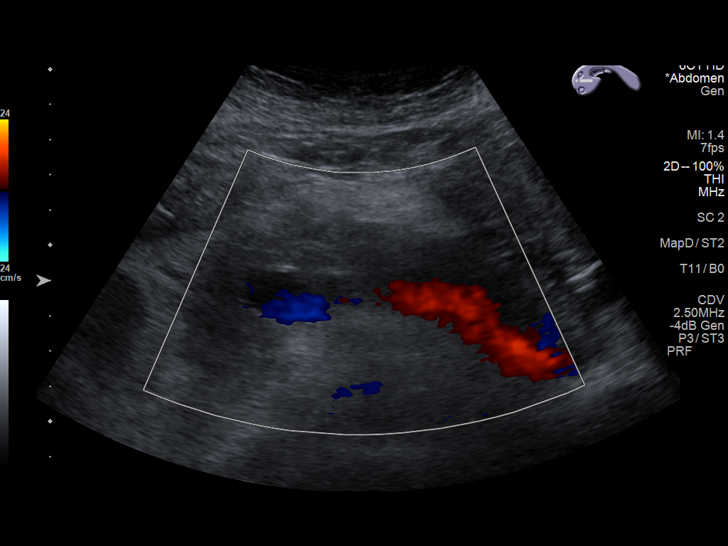
[im 39/78]
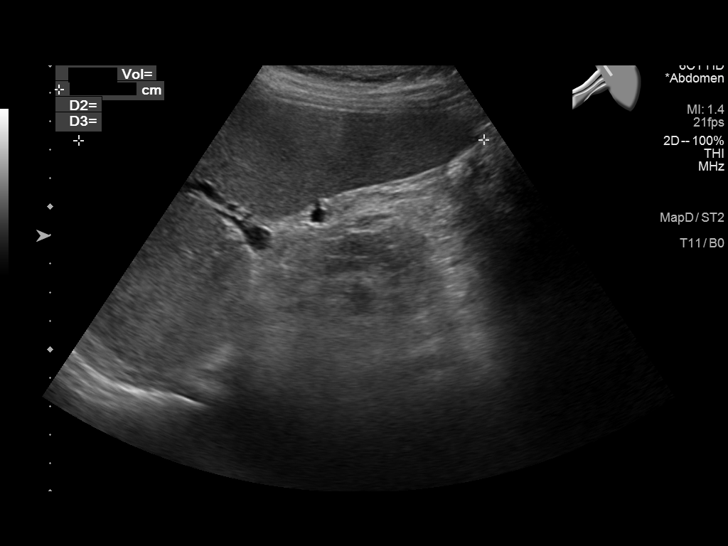
[im 45/78]
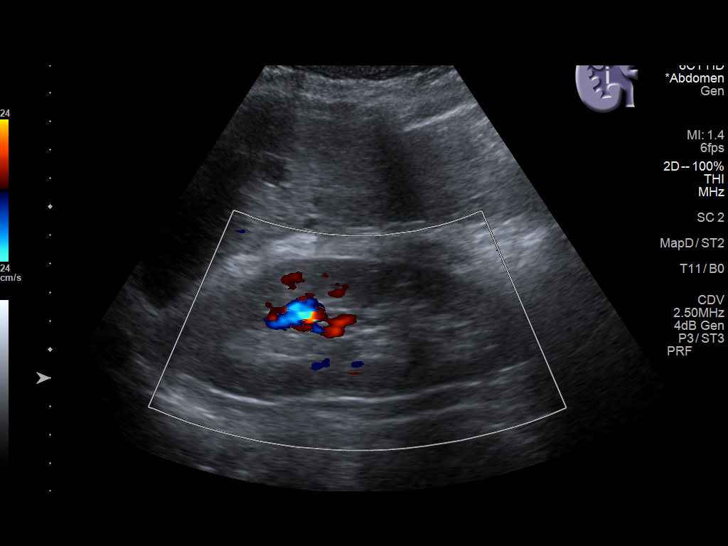
[im 52/78]
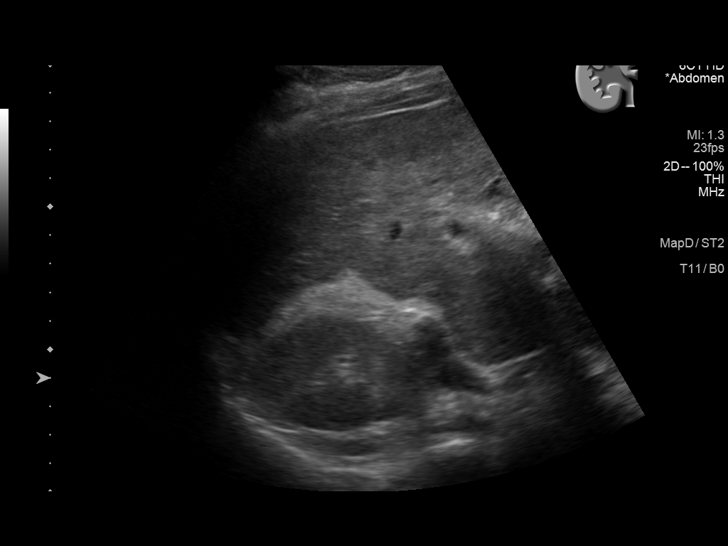
[im 58/78]
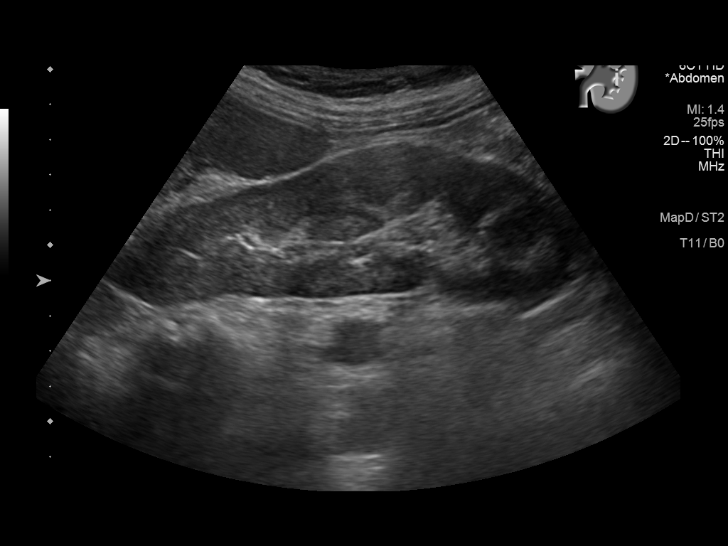
[im 65/78]
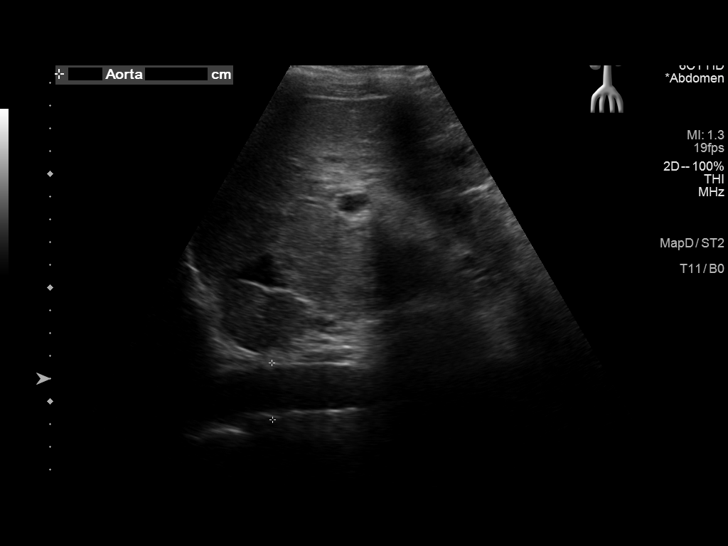
[im 71/78]
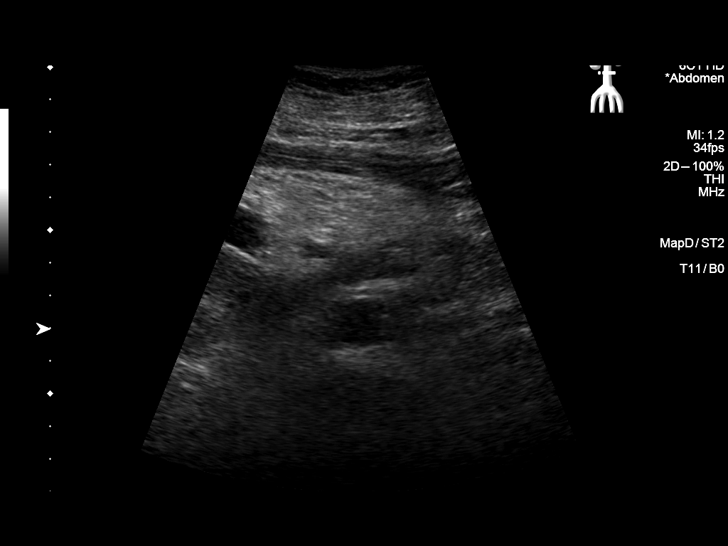
[im 78/78]
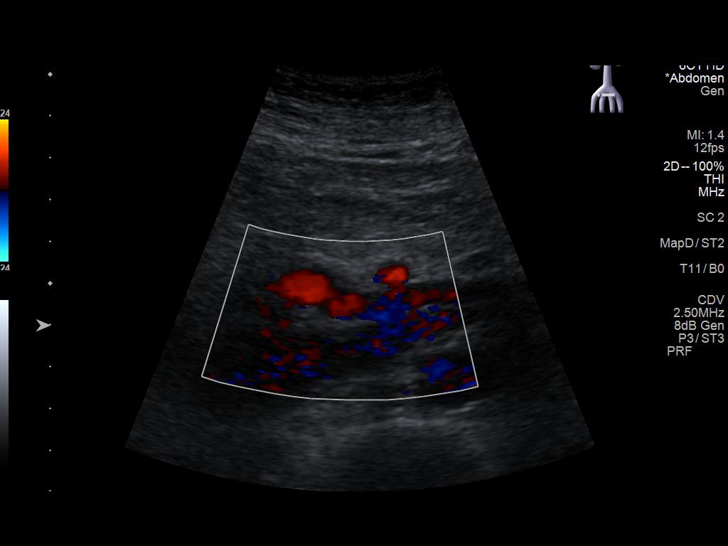

[13 of 25 positions shown; findings below may reference images not displayed]

FINDINGS: Gallbladder: Surgically absent

Common bile duct: Diameter: 7 mm today versus 3 mm on [DATE].

Liver: Multiple liver cysts with the largest measuring 2.1 cm.
Nodular contour consistent with history of cirrhosis. Increased
echogenicity with no focal solid mass. Portal vein is patent on
color Doppler imaging with normal direction of blood flow towards
the liver.

IVC: No abnormality visualized.

Pancreas: Visualized portions are normal.

Spleen: Splenomegaly with a length of 14 0.2 cm and a volume of 732
cc.

Right Kidney: Length: 14 cm. Echogenicity within normal limits. No
mass or hydronephrosis visualized.

Left Kidney: Length: 13.2 cm. Echogenicity within normal limits. No
mass or hydronephrosis visualized.

Abdominal aorta: No aneurysm visualized.

Other findings: None.
IMPRESSION: 1. The common bile duct measures 7 mm. While this common bile duct
measurement is most often normal in a patient after cholecystectomy,
the common bile duct measured 3 mm in [DATE]. Recommend
correlation with labs to exclude signs of biliary obstruction.
2. Cirrhotic liver.  No focal mass.
3. Splenomegaly.

## 2019-10-07 ENCOUNTER — Other Ambulatory Visit: Payer: Self-pay

## 2019-10-07 ENCOUNTER — Other Ambulatory Visit
Admission: RE | Admit: 2019-10-07 | Discharge: 2019-10-07 | Disposition: A | Discharge status: Home / Self Care | Payer: Federal, State, Local not specified - PPO | Source: Ambulatory Visit | Attending: Oncology | Admitting: Oncology

## 2019-10-07 DIAGNOSIS — Z01812 Encounter for preprocedural laboratory examination: Secondary | ICD-10-CM | POA: Diagnosis present

## 2019-10-07 DIAGNOSIS — Z20828 Contact with and (suspected) exposure to other viral communicable diseases: Secondary | ICD-10-CM | POA: Insufficient documentation

## 2019-10-08 LAB — SARS CORONAVIRUS 2 (TAT 6-24 HRS): SARS Coronavirus 2: NEGATIVE

## 2019-10-10 ENCOUNTER — Encounter
Admission: RE | Disposition: A | Discharge status: Home / Self Care | Payer: Self-pay | Source: Home / Self Care | Attending: Internal Medicine

## 2019-10-10 ENCOUNTER — Ambulatory Visit: Payer: Federal, State, Local not specified - PPO | Admitting: Anesthesiology

## 2019-10-10 ENCOUNTER — Ambulatory Visit
Admission: RE | Admit: 2019-10-10 | Discharge: 2019-10-10 | Disposition: A | Discharge status: Home / Self Care | Payer: Federal, State, Local not specified - PPO | Attending: Internal Medicine | Admitting: Internal Medicine

## 2019-10-10 ENCOUNTER — Encounter: Payer: Self-pay | Admitting: Internal Medicine

## 2019-10-10 DIAGNOSIS — K766 Portal hypertension: Secondary | ICD-10-CM | POA: Insufficient documentation

## 2019-10-10 DIAGNOSIS — E785 Hyperlipidemia, unspecified: Secondary | ICD-10-CM | POA: Insufficient documentation

## 2019-10-10 DIAGNOSIS — K7460 Unspecified cirrhosis of liver: Secondary | ICD-10-CM | POA: Insufficient documentation

## 2019-10-10 DIAGNOSIS — F329 Major depressive disorder, single episode, unspecified: Secondary | ICD-10-CM | POA: Insufficient documentation

## 2019-10-10 DIAGNOSIS — E119 Type 2 diabetes mellitus without complications: Secondary | ICD-10-CM | POA: Insufficient documentation

## 2019-10-10 DIAGNOSIS — I8500 Esophageal varices without bleeding: Secondary | ICD-10-CM | POA: Diagnosis present

## 2019-10-10 DIAGNOSIS — K219 Gastro-esophageal reflux disease without esophagitis: Secondary | ICD-10-CM | POA: Insufficient documentation

## 2019-10-10 DIAGNOSIS — I10 Essential (primary) hypertension: Secondary | ICD-10-CM | POA: Insufficient documentation

## 2019-10-10 DIAGNOSIS — K3189 Other diseases of stomach and duodenum: Secondary | ICD-10-CM | POA: Diagnosis not present

## 2019-10-10 HISTORY — PX: ESOPHAGOGASTRODUODENOSCOPY (EGD) WITH PROPOFOL: SHX5813

## 2019-10-10 LAB — GLUCOSE, CAPILLARY: Glucose-Capillary: 100 mg/dL — ABNORMAL HIGH (ref 70–99)

## 2019-10-10 SURGERY — ESOPHAGOGASTRODUODENOSCOPY (EGD) WITH PROPOFOL
Anesthesia: General

## 2019-10-10 MED ORDER — GLYCOPYRROLATE 0.2 MG/ML IJ SOLN
INTRAMUSCULAR | Status: AC
  Filled 2019-10-10: qty 1

## 2019-10-10 MED ORDER — PROPOFOL 10 MG/ML IV BOLUS
INTRAVENOUS | Status: DC | PRN
  Administered 2019-10-10: 60 mg via INTRAVENOUS

## 2019-10-10 MED ORDER — PROPOFOL 500 MG/50ML IV EMUL
INTRAVENOUS | Status: DC | PRN
  Administered 2019-10-10: 100 ug/kg/min via INTRAVENOUS

## 2019-10-10 MED ORDER — GLYCOPYRROLATE 0.2 MG/ML IJ SOLN
INTRAMUSCULAR | Status: DC | PRN
  Administered 2019-10-10: .2 mg via INTRAVENOUS

## 2019-10-10 MED ORDER — LIDOCAINE HCL (PF) 2 % IJ SOLN
INTRAMUSCULAR | Status: AC
  Filled 2019-10-10: qty 10

## 2019-10-10 MED ORDER — SODIUM CHLORIDE 0.9 % IV SOLN: INTRAVENOUS | Status: DC

## 2019-10-10 MED ORDER — LIDOCAINE HCL (CARDIAC) PF 100 MG/5ML IV SOSY
PREFILLED_SYRINGE | INTRAVENOUS | Status: DC | PRN
  Administered 2019-10-10: 80 mg via INTRAVENOUS

## 2019-10-10 NOTE — Interval H&P Note (Signed)
History and Physical Interval Note:  10/10/2019 9:19 AM  Roberta Lane  has presented today for surgery, with the diagnosis of ESOPHAGEAL VARICES.  The various methods of treatment have been discussed with the patient and family. After consideration of risks, benefits and other options for treatment, the patient has consented to  Procedure(s): ESOPHAGOGASTRODUODENOSCOPY (EGD) WITH PROPOFOL (N/A) as a surgical intervention.  The patient's history has been reviewed, patient examined, no change in status, stable for surgery.  I have reviewed the patient's chart and labs.  Questions were answered to the patient's satisfaction.     [place], [place]

## 2019-10-10 NOTE — Transfer of Care (Signed)
Immediate Anesthesia Transfer of Care Note  Patient: Roberta Lane  Procedure(s) Performed: ESOPHAGOGASTRODUODENOSCOPY (EGD) WITH PROPOFOL (N/A )  Patient Location: PACU and Endoscopy Unit  Anesthesia Type:General  Level of Consciousness: awake, drowsy and patient cooperative  Airway & Oxygen Therapy: Patient Spontanous Breathing  Post-op Assessment: Report given to RN and Post -op Vital signs reviewed and stable  Post vital signs: Reviewed and stable  Last Vitals:  Vitals Value Taken Time  BP 122/78 10/10/19 1042  Temp 36.3 C 10/10/19 1041  Pulse 99 10/10/19 1042  Resp 22 10/10/19 1042  SpO2 96 % 10/10/19 1042  Vitals shown include unvalidated device data.  Last Pain:  Vitals:   10/10/19 1041  TempSrc: Temporal  PainSc: Asleep         Complications: No apparent anesthesia complications

## 2019-10-10 NOTE — H&P (Signed)
Outpatient short stay form Pre-procedure 10/10/2019 9:17 AM Roberta Lane K. Roberta Lane, [name]  Primary Physician:  [place] Pink, [name]  Reason for visit: Portal hypertension, Esophageal varices.  History of present illness:  As above. Has hx of EGD with banding of esophageal varices in 2019. No recent bleeding, but has had some diffuse abdominal pain.    No current facility-administered medications for this encounter.  Medications Prior to Admission  Medication Sig Dispense Refill Last Dose  . metFORMIN (GLUCOPHAGE) 500 MG tablet Take 1,000 mg by mouth 2 (two) times daily with a meal.    10/09/2019 at 2130  . rifaximin (XIFAXAN) 550 MG TABS tablet Take by mouth.   10/09/2019 at Unknown time  . clobetasol ointment (TEMOVATE) 0.05 % APP TOPICALLY AA BID  1   . lactulose (CHRONULAC) 10 GM/15ML solution Take 45 mLs (30 g total) by mouth 2 (two) times daily. (Patient not taking: Reported on 07/19/2019) 240 mL 0   . spironolactone (ALDACTONE) 100 MG tablet Take 100 mg by mouth daily.         Allergies  Allergen Reactions  . Penicillins Anaphylaxis  . Amlodipine Besylate Itching  . Diphenhydramine-Acetaminophen Other (See Comments)    Made my breast hurt.  . Enalapril   . Furosemide   . Sulfa Antibiotics     Reaction unknown.  . Tylenol [Acetaminophen] Other (See Comments)    Breast pain  . Codeine Itching  . Latex Rash     Past Medical History:  Diagnosis Date  . Allergic genetic state   . Aortic ejection murmur   . Depression   . Diabetes mellitus without complication ([place])   . Dysrhythmia   . GERD (gastroesophageal reflux disease)   . Headache   . Hepatic cirrhosis ([place])   . Hepatitis C   . Hernia, abdominal   . Hyperlipidemia   . Hypertension   . Liver disease   . Palpitations   . Portal hypertension with esophageal varices ([facility])   . UTI (urinary tract infection)     Review of systems:  Otherwise negative.    Physical Exam  Gen: Alert, oriented. Appears stated age.   HEENT: [place]/AT. PERRLA. Lungs: CTA, no wheezes. CV: RR nl S1, S2. Abd: soft, benign, no masses. BS+ Ext: No edema. Pulses 2+    Planned procedures: Proceed with EGD. The patient understands the nature of the planned procedure, indications, risks, alternatives and potential complications including but not limited to bleeding, infection, perforation, damage to internal organs and possible oversedation/side effects from anesthesia. The patient agrees and gives consent to proceed.  Please refer to procedure notes for findings, recommendations and patient disposition/instructions.     Roberta Lane K. Roberta Lane, [name] Gastroenterology 10/10/2019  9:17 AM

## 2019-10-10 NOTE — Anesthesia Preprocedure Evaluation (Signed)
Anesthesia Evaluation  Patient identified by MRN, date of birth, ID band Patient awake    Reviewed: Allergy & Precautions, H&P , NPO status , Patient's Chart, lab work & pertinent test results, reviewed documented beta blocker date and time   Airway Mallampati: II   Neck ROM: full    Dental  (+) Poor Dentition   Pulmonary neg pulmonary ROS,    Pulmonary exam normal        Cardiovascular Exercise Tolerance: Good hypertension, On Medications Normal cardiovascular exam+ dysrhythmias + Valvular Problems/Murmurs  Rhythm:regular Rate:Normal     Neuro/Psych  Headaches, PSYCHIATRIC DISORDERS Depression    GI/Hepatic GERD  Medicated,(+) Hepatitis -, C  Endo/Other  negative endocrine ROSdiabetes  Renal/GU negative Renal ROS  negative genitourinary   Musculoskeletal   Abdominal   Peds  Hematology  (+) Blood dyscrasia, anemia ,   Anesthesia Other Findings Past Medical History: No date: Allergic genetic state No date: Aortic ejection murmur No date: Depression No date: Diabetes mellitus without complication (HCC) No date: Dysrhythmia No date: GERD (gastroesophageal reflux disease) No date: Headache No date: Hepatic cirrhosis (HCC) No date: Hepatitis C No date: Hernia, abdominal No date: Hyperlipidemia No date: Hypertension No date: Liver disease No date: Palpitations No date: Portal hypertension with esophageal varices (HCC) No date: UTI (urinary tract infection) Past Surgical History: No date: ABDOMINAL HYSTERECTOMY 11/23/2017: ESOPHAGOGASTRODUODENOSCOPY (EGD) WITH PROPOFOL; N/A     Comment:  Procedure: ESOPHAGOGASTRODUODENOSCOPY (EGD) WITH               PROPOFOL;  Surgeon: Toledo, Benay Pike, MD;  Location:               [facility];  Service: Gastroenterology;  Laterality:               N/A; 10/23/2018: ESOPHAGOGASTRODUODENOSCOPY (EGD) WITH PROPOFOL; N/A     Comment:  Procedure: ESOPHAGOGASTRODUODENOSCOPY (EGD)  WITH               PROPOFOL;  Surgeon: Toledo, Benay Pike, MD;  Location:               [facility];  Service: Gastroenterology;  Laterality:               N/A; No date: right elbow surgery No date: WISDOM TOOTH EXTRACTION   Reproductive/Obstetrics negative OB ROS                             Anesthesia Physical Anesthesia Plan  ASA: III  Anesthesia Plan: General   Post-op Pain Management:    Induction:   PONV Risk Score and Plan:   Airway Management Planned:   Additional Equipment:   Intra-op Plan:   Post-operative Plan:   Informed Consent: I have reviewed the patients History and Physical, chart, labs and discussed the procedure including the risks, benefits and alternatives for the proposed anesthesia with the patient or authorized representative who has indicated his/her understanding and acceptance.     Dental Advisory Given  Plan Discussed with: CRNA  Anesthesia Plan Comments:         Anesthesia Quick Evaluation

## 2019-10-10 NOTE — Anesthesia Post-op Follow-up Note (Signed)
Anesthesia QCDR form completed.        

## 2019-10-10 NOTE — Op Note (Signed)
[facility] Patient Name: Roberta Lane Procedure Date: 10/10/2019 10:15 AM MRN: [id number] Account #: [id number] Date of Birth: [birth year] Admit Type: Outpatient Age: 63 Room: [facility] ENDO ROOM 1 Gender: Female Note Status: Finalized Procedure:             Upper GI endoscopy Indications:           2nd degree variceal surveillance (following bleed and                         completed eradication) Providers:             Benay Pike. Sadrac Zeoli MD, MD Medicines:             Propofol per Anesthesia Complications:         No immediate complications. Estimated blood loss: None. Procedure:             Pre-Anesthesia Assessment:                        - The risks and benefits of the procedure and the                         sedation options and risks were discussed with the                         patient. All questions were answered and informed                         consent was obtained.                        - Patient identification and proposed procedure were                         verified prior to the procedure by the nurse. The                         procedure was verified in the procedure room.                        - ASA Grade Assessment: III - A patient with severe                         systemic disease.                        - After reviewing the risks and benefits, the patient                         was deemed in satisfactory condition to undergo the                         procedure.                        After obtaining informed consent, the endoscope was                         passed under direct vision. Throughout the procedure,  the patient's blood pressure, pulse, and oxygen                         saturations were monitored continuously. The Endoscope                         was introduced through the mouth, and advanced to the                         third part of duodenum. The upper GI endoscopy was                          accomplished without difficulty. The patient tolerated                         the procedure well. Findings:      Grade III varices were found in the middle third of the esophagus and in       the lower third of the esophagus. They were 7 mm in largest diameter.       Three bands were successfully placed with complete eradication,       resulting in deflation of varices. There was no bleeding during and at       the end of the procedure. Estimated blood loss: none.      Moderate portal hypertensive gastropathy was found in the entire       examined stomach.      There is no endoscopic evidence of varices in the cardia and in the       gastric fundus.      The examined duodenum was normal. Impression:            - Grade III esophageal varices. Completely eradicated.                         Banded.                        - Portal hypertensive gastropathy.                        - Normal examined duodenum.                        - No specimens collected. Recommendation:        - Patient has a contact number available for                         emergencies. The signs and symptoms of potential                         delayed complications were discussed with the patient.                         Return to normal activities tomorrow. Written                         discharge instructions were provided to the patient.                        - Low sodium diet.                        -  Continue present medications.                        - Repeat upper endoscopy in 1 month for retreatment.                        - Return to physician assistant in 3 weeks.                        - Follow up with Octavia Bruckner, PA-C in [ ]  months. Procedure Code(s):     --- Professional ---                        [phone], Esophagogastroduodenoscopy, flexible,                         transoral; with band ligation of esophageal/gastric                         varices Diagnosis Code(s):      --- Professional ---                        K31.89, Other diseases of stomach and duodenum                        K76.6, Portal hypertension                        I85.00, Esophageal varices without bleeding CPT copyright 2019 American Medical Association. All rights reserved. The codes documented in this report are preliminary and upon coder review may  be revised to meet current compliance requirements. Efrain Sella MD, MD 10/10/2019 10:45:01 AM This report has been signed electronically. Number of Addenda: 0 Note Initiated On: 10/10/2019 10:15 AM Estimated Blood Loss:  Estimated blood loss: none.      [facility]

## 2019-10-11 ENCOUNTER — Encounter: Payer: Self-pay | Admitting: *Deleted

## 2019-10-14 NOTE — Anesthesia Postprocedure Evaluation (Signed)
Anesthesia Post Note  Patient: Roberta Lane  Procedure(s) Performed: ESOPHAGOGASTRODUODENOSCOPY (EGD) WITH PROPOFOL (N/A )  Patient location during evaluation: PACU Anesthesia Type: General Level of consciousness: awake and alert Pain management: pain level controlled Vital Signs Assessment: post-procedure vital signs reviewed and stable Respiratory status: spontaneous breathing, nonlabored ventilation, respiratory function stable and patient connected to nasal cannula oxygen Cardiovascular status: blood pressure returned to baseline and stable Postop Assessment: no apparent nausea or vomiting Anesthetic complications: no     Last Vitals:  Vitals:   10/10/19 1041 10/10/19 1101  BP: 122/78 140/89  Pulse: 99   Resp: (!) 22   Temp: (!) 36.3 C   SpO2: 97% 98%    Last Pain:  Vitals:   10/11/19 0844  TempSrc:   PainSc: 0-No pain                 Molli Barrows

## 2019-10-16 NOTE — Progress Notes (Deleted)
[place]  Telephone:([phone]) [phone] Fax:(336) [phone]  ID: Roberta Lane OB: 1955/12/03  MR#: [id number]  CSN#:[id number]  Patient Care Team: [place] Pink, MD as PCP - General (Family Medicine) Lloyd Huger, MD as Consulting Physician (Oncology)  CHIEF COMPLAINT: Pancytopenia, iron deficiency anemia.  INTERVAL HISTORY: Patient returns to clinic today for repeat laboratory, further evaluation, and consideration of IV Feraheme.  She continues to have chronic weakness and fatigue as well as pica.  She otherwise feels well. She has no neurologic complaints.  She denies any recent fevers or illnesses.  She has a good appetite and denies weight loss.  She denies any chest pain, cough, hemoptysis, or shortness of breath.  She denies any abdominal pain. She denies any nausea, vomiting, constipation, or diarrhea.  She has no melena or hematochezia. She has no urinary complaints.  Patient offers no further specific complaints today.  REVIEW OF SYSTEMS:   Review of Systems  Constitutional: Positive for malaise/fatigue. Negative for fever and weight loss.  Respiratory: Negative.  Negative for cough, hemoptysis and shortness of breath.   Cardiovascular: Negative.  Negative for chest pain and leg swelling.  Gastrointestinal: Negative.  Negative for abdominal pain, blood in stool and melena.  Genitourinary: Negative.  Negative for dysuria and hematuria.  Musculoskeletal: Negative.  Negative for back pain.  Skin: Negative.  Negative for rash.  Neurological: Positive for weakness. Negative for sensory change and focal weakness.  Psychiatric/Behavioral: Negative.  The patient is not nervous/anxious.     As per HPI. Otherwise, a complete review of systems is negative.  PAST MEDICAL HISTORY: Past Medical History:  Diagnosis Date  . Allergic genetic state   . Aortic ejection murmur   . Depression   . Diabetes mellitus without complication ([place])   . Dysrhythmia   .  GERD (gastroesophageal reflux disease)   . Headache   . Hepatic cirrhosis ([place])   . Hepatitis C   . Hernia, abdominal   . Hyperlipidemia   . Hypertension   . Liver disease   . Palpitations   . Portal hypertension with esophageal varices ([facility])   . UTI (urinary tract infection)     PAST SURGICAL HISTORY: Past Surgical History:  Procedure Laterality Date  . ABDOMINAL HYSTERECTOMY    . ESOPHAGOGASTRODUODENOSCOPY (EGD) WITH PROPOFOL N/A 11/23/2017   Procedure: ESOPHAGOGASTRODUODENOSCOPY (EGD) WITH PROPOFOL;  Surgeon: Toledo, Benay Pike, MD;  Location: [facility] ENDOSCOPY;  Service: Gastroenterology;  Laterality: N/A;  . ESOPHAGOGASTRODUODENOSCOPY (EGD) WITH PROPOFOL N/A 10/23/2018   Procedure: ESOPHAGOGASTRODUODENOSCOPY (EGD) WITH PROPOFOL;  Surgeon: Toledo, Benay Pike, MD;  Location: [facility] ENDOSCOPY;  Service: Gastroenterology;  Laterality: N/A;  . ESOPHAGOGASTRODUODENOSCOPY (EGD) WITH PROPOFOL N/A 10/10/2019   Procedure: ESOPHAGOGASTRODUODENOSCOPY (EGD) WITH PROPOFOL;  Surgeon: Toledo, Benay Pike, MD;  Location: [facility] ENDOSCOPY;  Service: Gastroenterology;  Laterality: N/A;  . right elbow surgery    . WISDOM TOOTH EXTRACTION      FAMILY HISTORY: Family History  Problem Relation Age of Onset  . Diabetes Mother   . Heart attack Mother   . Hypertension Father   . Thyroid disease Sister   . Sickle cell anemia Sister   . Breast cancer Neg Hx     ADVANCED DIRECTIVES (Y/N):  N  HEALTH MAINTENANCE: Social History   Tobacco Use  . Smoking status: Never Smoker  . Smokeless tobacco: Never Used  Substance Use Topics  . Alcohol use: Not Currently  . Drug use: Never     Colonoscopy:  PAP:  Bone density:  Lipid  panel:  Allergies  Allergen Reactions  . Penicillins Anaphylaxis  . Amlodipine Besylate Itching  . Diphenhydramine-Acetaminophen Other (See Comments)    Made my breast hurt.  . Enalapril   . Furosemide   . Sulfa Antibiotics     Reaction unknown.  . Tylenol [Acetaminophen]  Other (See Comments)    Breast pain  . Codeine Itching  . Latex Rash    Current Outpatient Medications  Medication Sig Dispense Refill  . clobetasol ointment (TEMOVATE) 0.05 % APP TOPICALLY AA BID  1  . lactulose (CHRONULAC) 10 GM/15ML solution Take 45 mLs (30 g total) by mouth 2 (two) times daily. (Patient not taking: Reported on 07/19/2019) 240 mL 0  . metFORMIN (GLUCOPHAGE) 500 MG tablet Take 1,000 mg by mouth 2 (two) times daily with a meal.     . rifaximin (XIFAXAN) 550 MG TABS tablet Take by mouth.    . spironolactone (ALDACTONE) 100 MG tablet Take 100 mg by mouth daily.      No current facility-administered medications for this visit.    OBJECTIVE: There were no vitals filed for this visit.   There is no height or weight on file to calculate BMI.    ECOG FS:0 - Asymptomatic  General: Well-developed, well-nourished, no acute distress. Eyes: Pink conjunctiva, anicteric sclera. HEENT: Normocephalic, moist mucous membranes. Lungs: Clear to auscultation bilaterally. Heart: Regular rate and rhythm. No rubs, murmurs, or gallops. Abdomen: Soft, nontender, nondistended. No organomegaly noted, normoactive bowel sounds. Musculoskeletal: No edema, cyanosis, or clubbing. Neuro: Alert, answering all questions appropriately. Cranial nerves grossly intact. Skin: No rashes or petechiae noted. Psych: Normal affect.  LAB RESULTS:  Lab Results  Component Value Date   NA 138 07/21/2018   K 4.1 07/21/2018   CL 112 (H) 07/21/2018   CO2 18 (L) 07/21/2018   GLUCOSE 111 (H) 07/21/2018   BUN 8 07/21/2018   CREATININE 0.66 07/21/2018   CALCIUM 8.6 (L) 07/21/2018   PROT 7.9 07/20/2018   ALBUMIN 3.6 07/20/2018   AST 48 (H) 07/20/2018   ALT 27 07/20/2018   ALKPHOS 91 07/20/2018   BILITOT 2.5 (H) 07/20/2018   GFRNONAA >60 07/21/2018   GFRAA >60 07/21/2018    Lab Results  Component Value Date   WBC 2.6 (L) 07/19/2019   NEUTROABS 1.7 07/19/2019   HGB 8.9 (L) 07/19/2019   HCT 28.8 (L)  07/19/2019   MCV 77.8 (L) 07/19/2019   PLT 86 (L) 07/19/2019   Lab Results  Component Value Date   IRON 33 07/19/2019   TIBC 531 (H) 07/19/2019   IRONPCTSAT 6 (L) 07/19/2019   Lab Results  Component Value Date   FERRITIN 8 (L) 07/19/2019     STUDIES: US Abdomen Complete  Result Date: 10/04/2019 CLINICAL DATA:  History of cirrhosis.  Abdominal pain. EXAM: ABDOMEN ULTRASOUND COMPLETE COMPARISON:  None. FINDINGS: Gallbladder: Surgically absent Common bile duct: Diameter: 7 mm today versus 3 mm on April 05, 2019. Liver: Multiple liver cysts with the largest measuring 2.1 cm. Nodular contour consistent with history of cirrhosis. Increased echogenicity with no focal solid mass. Portal vein is patent on color Doppler imaging with normal direction of blood flow towards the liver. IVC: No abnormality visualized. Pancreas: Visualized portions are normal. Spleen: Splenomegaly with a length of 14 0.2 cm and a volume of 732 cc. Right Kidney: Length: 14 cm. Echogenicity within normal limits. No mass or hydronephrosis visualized. Left Kidney: Length: 13.2 cm. Echogenicity within normal limits. No mass or hydronephrosis visualized. Abdominal aorta: No  aneurysm visualized. Other findings: None. IMPRESSION: 1. The common bile duct measures 7 mm. While this common bile duct measurement is most often normal in a patient after cholecystectomy, the common bile duct measured 3 mm in June of 2020. Recommend correlation with labs to exclude signs of biliary obstruction. 2. Cirrhotic liver.  No focal mass. 3. Splenomegaly. Electronically Signed   By: Dorise Bullion III M.D   On: 10/04/2019 11:42    ASSESSMENT: Pancytopenia  PLAN:    1.  Pancytopenia: Multifactorial including cirrhosis and splenomegaly of greater than 17 cm noted on CT scan on November 21, 2017 causing sequestration.  Patient's white blood cell count and platelet count remain decreased, but a relatively stable.  Hemoglobin is decreased to 8.9 and she  has significantly reduced iron stores.  She was previously noted to have antineutrophil antibodies as well as platelet antibodies but both are likely clinically insignificant given the stability of her laboratory work.  No intervention is needed at this time.  Patient does not require bone marrow biopsy.  2.  Cirrhosis: AFP continues to be within normal limits at 2.6.  Continue follow-up with GI as indicated. 3.  GI bleed: Patient denies any recent bleeding.  Patient's most recent EGD on October 23, 2018 revealed grade 2 esophageal varices and portal hypertensive gastropathy.  Recommendation was to repeat endoscopy in 1 year. 4.  Iron deficiency anemia: Patient's hemoglobin and iron stores are significantly decreased and she is symptomatic.  Proceed with 510 mg IV Feraheme today and return to clinic in 1 week for second infusion.  Patient will then return to clinic in 3 months with repeat laboratory work, further evaluation, and consideration of additional treatment.    Patient expressed understanding and was in agreement with this plan. She also understands that She can call clinic at any time with any questions, concerns, or complaints.    Lloyd Huger, MD   10/16/2019 4:09 PM

## 2019-10-22 ENCOUNTER — Other Ambulatory Visit: Payer: Self-pay | Admitting: Emergency Medicine

## 2019-10-22 DIAGNOSIS — D509 Iron deficiency anemia, unspecified: Secondary | ICD-10-CM

## 2019-10-23 ENCOUNTER — Inpatient Hospital Stay: Payer: Federal, State, Local not specified - PPO

## 2019-10-23 ENCOUNTER — Encounter: Payer: Self-pay | Admitting: Oncology

## 2019-10-23 ENCOUNTER — Inpatient Hospital Stay: Payer: Federal, State, Local not specified - PPO | Admitting: Oncology

## 2019-10-23 ENCOUNTER — Telehealth: Payer: Self-pay | Admitting: Oncology

## 2019-10-23 NOTE — Telephone Encounter (Signed)
Called both home and mobile numbers listed for pt with no answer. Unable to leave a message due to VM being full. Will mail pt letter to contact office to r\s missed appt.

## 2019-11-11 ENCOUNTER — Other Ambulatory Visit
Admission: RE | Admit: 2019-11-11 | Discharge: 2019-11-11 | Disposition: A | Discharge status: Home / Self Care | Payer: Federal, State, Local not specified - PPO | Source: Ambulatory Visit | Attending: Internal Medicine | Admitting: Internal Medicine

## 2019-11-11 DIAGNOSIS — Z01812 Encounter for preprocedural laboratory examination: Secondary | ICD-10-CM | POA: Insufficient documentation

## 2019-11-11 DIAGNOSIS — Z20822 Contact with and (suspected) exposure to covid-19: Secondary | ICD-10-CM | POA: Insufficient documentation

## 2019-11-12 LAB — SARS CORONAVIRUS 2 (TAT 6-24 HRS): SARS Coronavirus 2: NEGATIVE

## 2019-11-13 ENCOUNTER — Encounter: Payer: Self-pay | Admitting: Internal Medicine

## 2019-11-14 ENCOUNTER — Ambulatory Visit
Admission: RE | Admit: 2019-11-14 | Discharge: 2019-11-14 | Disposition: A | Discharge status: Home / Self Care | Payer: Federal, State, Local not specified - PPO | Attending: Internal Medicine | Admitting: Internal Medicine

## 2019-11-14 ENCOUNTER — Ambulatory Visit: Payer: Federal, State, Local not specified - PPO | Admitting: Certified Registered Nurse Anesthetist

## 2019-11-14 ENCOUNTER — Encounter: Payer: Self-pay | Admitting: Internal Medicine

## 2019-11-14 ENCOUNTER — Other Ambulatory Visit: Payer: Self-pay

## 2019-11-14 ENCOUNTER — Encounter
Admission: RE | Disposition: A | Discharge status: Home / Self Care | Payer: Self-pay | Source: Home / Self Care | Attending: Internal Medicine

## 2019-11-14 DIAGNOSIS — K766 Portal hypertension: Secondary | ICD-10-CM | POA: Insufficient documentation

## 2019-11-14 DIAGNOSIS — E119 Type 2 diabetes mellitus without complications: Secondary | ICD-10-CM | POA: Insufficient documentation

## 2019-11-14 DIAGNOSIS — B9681 Helicobacter pylori [H. pylori] as the cause of diseases classified elsewhere: Secondary | ICD-10-CM | POA: Diagnosis not present

## 2019-11-14 DIAGNOSIS — I8510 Secondary esophageal varices without bleeding: Secondary | ICD-10-CM | POA: Diagnosis not present

## 2019-11-14 DIAGNOSIS — K219 Gastro-esophageal reflux disease without esophagitis: Secondary | ICD-10-CM | POA: Insufficient documentation

## 2019-11-14 DIAGNOSIS — I10 Essential (primary) hypertension: Secondary | ICD-10-CM | POA: Insufficient documentation

## 2019-11-14 DIAGNOSIS — K3189 Other diseases of stomach and duodenum: Secondary | ICD-10-CM | POA: Insufficient documentation

## 2019-11-14 DIAGNOSIS — Z79899 Other long term (current) drug therapy: Secondary | ICD-10-CM | POA: Diagnosis not present

## 2019-11-14 DIAGNOSIS — Z7984 Long term (current) use of oral hypoglycemic drugs: Secondary | ICD-10-CM | POA: Insufficient documentation

## 2019-11-14 DIAGNOSIS — F329 Major depressive disorder, single episode, unspecified: Secondary | ICD-10-CM | POA: Insufficient documentation

## 2019-11-14 DIAGNOSIS — K2950 Unspecified chronic gastritis without bleeding: Secondary | ICD-10-CM | POA: Insufficient documentation

## 2019-11-14 HISTORY — PX: ESOPHAGOGASTRODUODENOSCOPY (EGD) WITH PROPOFOL: SHX5813

## 2019-11-14 HISTORY — DX: Cardiac arrhythmia, unspecified: I49.9

## 2019-11-14 LAB — GLUCOSE, CAPILLARY: Glucose-Capillary: 116 mg/dL — ABNORMAL HIGH (ref 70–99)

## 2019-11-14 SURGERY — ESOPHAGOGASTRODUODENOSCOPY (EGD) WITH PROPOFOL
Anesthesia: General

## 2019-11-14 MED ORDER — PROPOFOL 10 MG/ML IV BOLUS
INTRAVENOUS | Status: DC | PRN
  Administered 2019-11-14: 70 mg via INTRAVENOUS

## 2019-11-14 MED ORDER — LIDOCAINE HCL (CARDIAC) PF 100 MG/5ML IV SOSY
PREFILLED_SYRINGE | INTRAVENOUS | Status: DC | PRN
  Administered 2019-11-14: 100 mg via INTRAVENOUS

## 2019-11-14 MED ORDER — PROPOFOL 500 MG/50ML IV EMUL
INTRAVENOUS | Status: DC | PRN
Start: 2019-11-14 — End: 2019-11-14
  Administered 2019-11-14: 160 ug/kg/min via INTRAVENOUS

## 2019-11-14 MED ORDER — SODIUM CHLORIDE 0.9 % IV SOLN: INTRAVENOUS | Status: DC

## 2019-11-14 NOTE — Transfer of Care (Signed)
Immediate Anesthesia Transfer of Care Note  Patient: Roberta Lane  Procedure(s) Performed: ESOPHAGOGASTRODUODENOSCOPY (EGD) WITH PROPOFOL (N/A )   Patient Location: PACU  Anesthesia Type:General  Level of Consciousness: drowsy  Airway & Oxygen Therapy: Patient Spontanous Breathing and Patient connected to nasal cannula oxygen  Post-op Assessment: Report given to RN and Post -op Vital signs reviewed and stable  Post vital signs: Reviewed and stable  Last Vitals:  Vitals Value Taken Time  BP 103/61 11/14/19 0919  Temp 36.6 C 11/14/19 0919  Pulse 71 11/14/19 0920  Resp 17 11/14/19 0920  SpO2 97 % 11/14/19 0920  Vitals shown include unvalidated device data.  Last Pain:  Vitals:   11/14/19 0919  TempSrc: Temporal  PainSc: Asleep         Complications: No apparent anesthesia complications

## 2019-11-14 NOTE — Anesthesia Procedure Notes (Signed)
Performed by: Elen Acero, CRNA Pre-anesthesia Checklist: Patient identified, Emergency Drugs available, Suction available, Patient being monitored and Timeout performed Patient Re-evaluated:Patient Re-evaluated prior to induction Oxygen Delivery Method: Nasal cannula Induction Type: IV induction       

## 2019-11-14 NOTE — Op Note (Signed)
[facility] Patient Name: Roberta Lane Procedure Date: 11/14/2019 9:05 AM MRN: [id number] Account #: [id number] Date of Birth: 1956-08-23 Admit Type: Outpatient Age: 64 Room: [facility] ENDO ROOM 3 Gender: Female Note Status: Finalized Procedure:             Upper GI endoscopy Indications:           For therapy of esophageal varices, 2nd degree variceal                         eradication (following bleed) Providers:             Benay Pike. Alice Reichert MD, MD Referring MD:          Irven Easterly. Kary Kos, MD (Referring MD) Medicines:             Propofol per Anesthesia Complications:         No immediate complications. Estimated blood loss: None. Procedure:             Pre-Anesthesia Assessment:                        - The risks and benefits of the procedure and the                         sedation options and risks were discussed with the                         patient. All questions were answered and informed                         consent was obtained.                        - Patient identification and proposed procedure were                         verified prior to the procedure by the nurse. The                         procedure was verified in the procedure room.                        - ASA Grade Assessment: III - A patient with severe                         systemic disease.                        - After reviewing the risks and benefits, the patient                         was deemed in satisfactory condition to undergo the                         procedure.                        After obtaining informed consent, the endoscope was  passed under direct vision. Throughout the procedure,                         the patient's blood pressure, pulse, and oxygen                         saturations were monitored continuously. The Endoscope                         was introduced through the mouth, and advanced to the                third part of duodenum. The upper GI endoscopy was                         accomplished without difficulty. The patient tolerated                         the procedure well. Findings:      Grade I varices were found in the lower third of the esophagus. They       were 4 mm in largest diameter. Variceal banding not deemed necessary. NO       stigmata of active or recent bleeding and size markedly decreased since       last EGD dated 10/10/2019. Estimated blood loss: none.      Mild portal hypertensive gastropathy was found in the entire examined       stomach.      Localized moderately congested mucosa without active bleeding and with       no stigmata of bleeding was found in the first portion of the duodenum.       Biopsies obtained in the gastric antrum to rule out H. Pylori.      The second portion of the duodenum and third portion of the duodenum       were normal.      The exam was otherwise without abnormality. Impression:            - Grade I esophageal varices.                        - Portal hypertensive gastropathy.                        - Congested duodenal mucosa.                        - Normal second portion of the duodenum and third                         portion of the duodenum.                        - The examination was otherwise normal.                        - No specimens collected. Recommendation:        - Patient has a contact number available for                         emergencies. The signs and symptoms of potential  delayed complications were discussed with the patient.                         Return to normal activities tomorrow. Written                         discharge instructions were provided to the patient.                        - Resume previous diet.                        - Continue present medications.                        - Repeat upper endoscopy in 1 year for surveillance.                        - Return  to GI office in 3 months.                        - The findings and recommendations were discussed with                         the patient. Procedure Code(s):     --- Professional ---                        [phone], Esophagogastroduodenoscopy, flexible,                         transoral; diagnostic, including collection of                         specimen(s) by brushing or washing, when performed                         (separate procedure) Diagnosis Code(s):     --- Professional ---                        K31.89, Other diseases of stomach and duodenum                        K76.6, Portal hypertension                        I85.00, Esophageal varices without bleeding CPT copyright 2019 American Medical Association. All rights reserved. The codes documented in this report are preliminary and upon coder review may  be revised to meet current compliance requirements. Efrain Sella MD, MD 11/14/2019 9:21:11 AM This report has been signed electronically. Number of Addenda: 0 Note Initiated On: 11/14/2019 9:05 AM Estimated Blood Loss:  Estimated blood loss: none.      [facility]

## 2019-11-14 NOTE — Anesthesia Postprocedure Evaluation (Signed)
Anesthesia Post Note  Patient: Roberta Lane  Procedure(s) Performed: ESOPHAGOGASTRODUODENOSCOPY (EGD) WITH PROPOFOL (N/A )  Patient location during evaluation: Endoscopy Anesthesia Type: General Level of consciousness: awake and alert Pain management: pain level controlled Vital Signs Assessment: post-procedure vital signs reviewed and stable Respiratory status: spontaneous breathing, nonlabored ventilation, respiratory function stable and patient connected to nasal cannula oxygen Cardiovascular status: blood pressure returned to baseline and stable Postop Assessment: no apparent nausea or vomiting Anesthetic complications: no     Last Vitals:  Vitals:   11/14/19 0919 11/14/19 0939  BP: 103/61 122/64  Pulse: 71   Resp: 18   Temp: 36.6 C   SpO2: 97%     Last Pain:  Vitals:   11/14/19 0949  TempSrc:   PainSc: 0-No pain                 Martha Clan

## 2019-11-14 NOTE — Interval H&P Note (Signed)
History and Physical Interval Note:  11/14/2019 8:26 AM  Roberta Lane  has presented today for surgery, with the diagnosis of ESOPHAGEAL VARICES HEPATIC CIRRHOSIS.  The various methods of treatment have been discussed with the patient and family. After consideration of risks, benefits and other options for treatment, the patient has consented to  Procedure(s): ESOPHAGOGASTRODUODENOSCOPY (EGD) WITH PROPOFOL (N/A) as a surgical intervention.  The patient's history has been reviewed, patient examined, no change in status, stable for surgery.  I have reviewed the patient's chart and labs.  Questions were answered to the patient's satisfaction.     [place], [place]

## 2019-11-14 NOTE — H&P (Signed)
Outpatient short stay form Pre-procedure 11/14/2019 8:24 AM Teodoro K. Alice Reichert, [name]  Primary Physician: Maryland Pink, [name]  Reason for visit:  Esophageal varices treatment.  History of present illness:  Patient has hx of UGI bleed from varices and presents for secondary prevention. Last EGD on 10/10/2019 revealed grade III varices that were banded. Today is a scheduled visit to pursue banding of any further varices requiring eradication. Patient denies any melena, hemetemesis, abdominal pain in the interim.    Current Facility-Administered Medications:  .  0.9 %  sodium chloride infusion, , Intravenous, Continuous, Toledo, Benay Pike, MD  Medications Prior to Admission  Medication Sig Dispense Refill Last Dose  . clobetasol ointment (TEMOVATE) 0.05 % APP TOPICALLY AA BID  1 Past Month at Unknown time  . famotidine (PEPCID) 20 MG tablet Take 20 mg by mouth 2 (two) times daily.   Past Week at Unknown time  . metFORMIN (GLUCOPHAGE) 500 MG tablet Take 1,000 mg by mouth 2 (two) times daily with a meal.    11/13/2019 at 1900  . nadolol (CORGARD) 20 MG tablet Take 20 mg by mouth daily.   11/13/2019 at 1900  . traZODone (DESYREL) 50 MG tablet Take 50 mg by mouth at bedtime.   Past Week at Unknown time  . ursodiol (ACTIGALL) 300 MG capsule Take 300 mg by mouth 2 (two) times daily.   Past Week at Unknown time  . lactulose (CHRONULAC) 10 GM/15ML solution Take 45 mLs (30 g total) by mouth 2 (two) times daily. (Patient not taking: Reported on 07/19/2019) 240 mL 0 Not Taking at Unknown time  . rifaximin (XIFAXAN) 550 MG TABS tablet Take by mouth.   Not Taking at Unknown time  . spironolactone (ALDACTONE) 100 MG tablet Take 100 mg by mouth daily.         Allergies  Allergen Reactions  . Penicillins Anaphylaxis  . Amlodipine Besylate Itching  . Diphenhydramine-Acetaminophen Other (See Comments)    Made my breast hurt.  . Enalapril   . Furosemide   . Sulfa Antibiotics     Reaction unknown.  .  Tylenol [Acetaminophen] Other (See Comments)    Breast pain  . Codeine Itching  . Latex Rash     Past Medical History:  Diagnosis Date  . Allergic genetic state   . Aortic ejection murmur   . Depression   . Diabetes mellitus without complication ([place])   . Dysrhythmia   . GERD (gastroesophageal reflux disease)   . Headache   . Hepatic cirrhosis ([place])   . Hepatitis C   . Hernia, abdominal   . Hyperlipidemia   . Hypertension   . Irregular heart rhythm   . Liver disease   . Palpitations   . Portal hypertension with esophageal varices (HCC)   . UTI (urinary tract infection)     Review of systems:  Otherwise negative.    Physical Exam  Gen: Alert, oriented. Appears stated age.  HEENT: [place]/AT. PERRLA. Lungs: CTA, no wheezes. CV: RR nl S1, S2. Abd: soft, benign, no masses. BS+ Ext: No edema. Pulses 2+    Planned procedures: Proceed with EGD. The patient understands the nature of the planned procedure, indications, risks, alternatives and potential complications including but not limited to bleeding, infection, perforation, damage to internal organs and possible oversedation/side effects from anesthesia. The patient agrees and gives consent to proceed.  Please refer to procedure notes for findings, recommendations and patient disposition/instructions.     Teodoro K. Alice Reichert, [name] Gastroenterology 11/14/2019  8:24 AM

## 2019-11-14 NOTE — Anesthesia Preprocedure Evaluation (Signed)
Anesthesia Evaluation  Patient identified by MRN, date of birth, ID band Patient awake    Reviewed: Allergy & Precautions, NPO status , Patient's Chart, lab work & pertinent test results  History of Anesthesia Complications Negative for: history of anesthetic complications  Airway Mallampati: II  TM Distance: >3 FB Neck ROM: Full    Dental no notable dental hx.    Pulmonary neg pulmonary ROS, neg sleep apnea, neg COPD,    breath sounds clear to auscultation- rhonchi (-) wheezing      Cardiovascular hypertension, (-) angina(-) CAD, (-) Past MI, (-) Cardiac Stents and (-) CABG + dysrhythmias + Valvular Problems/Murmurs  Rhythm:Regular Rate:Normal - Systolic murmurs and - Diastolic murmurs    Neuro/Psych  Headaches, PSYCHIATRIC DISORDERS Depression    GI/Hepatic GERD  ,(+) Cirrhosis   Esophageal Varices    , Hepatitis -, C  Endo/Other  diabetes, Oral Hypoglycemic Agents  Renal/GU      Musculoskeletal negative musculoskeletal ROS (+)   Abdominal (+) - obese,   Peds  Hematology negative hematology ROS (+)   Anesthesia Other Findings Past Medical History: No date: Allergic genetic state No date: Aortic ejection murmur No date: Depression No date: Diabetes mellitus without complication (HCC) No date: Dysrhythmia No date: GERD (gastroesophageal reflux disease) No date: Headache No date: Hepatic cirrhosis (HCC) No date: Hepatitis C No date: Hernia, abdominal No date: Hyperlipidemia No date: Hypertension No date: Liver disease No date: Palpitations No date: Portal hypertension with esophageal varices (HCC) No date: UTI (urinary tract infection)   Reproductive/Obstetrics                             Anesthesia Physical  Anesthesia Plan  ASA: III  Anesthesia Plan: General   Post-op Pain Management:    Induction: Intravenous  PONV Risk Score and Plan: 2 and Propofol infusion and  TIVA  Airway Management Planned: Natural Airway and Nasal Cannula  Additional Equipment:   Intra-op Plan:   Post-operative Plan:   Informed Consent: I have reviewed the patients History and Physical, chart, labs and discussed the procedure including the risks, benefits and alternatives for the proposed anesthesia with the patient or authorized representative who has indicated his/her understanding and acceptance.     Dental advisory given  Plan Discussed with: CRNA and Anesthesiologist  Anesthesia Plan Comments:         Anesthesia Quick Evaluation

## 2019-11-15 LAB — SURGICAL PATHOLOGY

## 2019-11-22 NOTE — Progress Notes (Signed)
[place]  Telephone:(336) [phone] Fax:(336) [phone]  ID: Roberta Lane OB: 03/21/1956  MR#: [id number]  CSN#:[id number]  Patient Care Team: Maryland Pink, MD as PCP - General (Family Medicine) Lloyd Huger, MD as Consulting Physician (Oncology)  CHIEF COMPLAINT: Pancytopenia, iron deficiency anemia.  INTERVAL HISTORY: Patient returns to clinic today for repeat laboratory, further evaluation, consideration of additional IV iron.  She has increased weakness and fatigue, but attributes this to recent issues with her liver disease and banding procedure.  She otherwise feels well. She has no neurologic complaints.  She denies any recent fevers.  She has a good appetite and denies weight loss.  She denies any chest pain, cough, hemoptysis, or shortness of breath.  She denies any abdominal pain. She denies any nausea, vomiting, constipation, or diarrhea.  She has no melena or hematochezia. She has no urinary complaints.  Patient offers no further specific complaints today.  REVIEW OF SYSTEMS:   Review of Systems  Constitutional: Positive for malaise/fatigue. Negative for fever and weight loss.  Respiratory: Negative.  Negative for cough, hemoptysis and shortness of breath.   Cardiovascular: Negative.  Negative for chest pain and leg swelling.  Gastrointestinal: Negative.  Negative for abdominal pain, blood in stool and melena.  Genitourinary: Negative.  Negative for dysuria and hematuria.  Musculoskeletal: Negative.  Negative for back pain.  Skin: Negative.  Negative for rash.  Neurological: Positive for weakness. Negative for sensory change and focal weakness.  Psychiatric/Behavioral: Negative.  The patient is not nervous/anxious.     As per HPI. Otherwise, a complete review of systems is negative.  PAST MEDICAL HISTORY: Past Medical History:  Diagnosis Date  . Allergic genetic state   . Aortic ejection murmur   . Depression   . Diabetes mellitus  without complication ([place])   . Dysrhythmia   . GERD (gastroesophageal reflux disease)   . Headache   . Hepatic cirrhosis ([place])   . Hepatitis C   . Hernia, abdominal   . Hyperlipidemia   . Hypertension   . Irregular heart rhythm   . Liver disease   . Palpitations   . Portal hypertension with esophageal varices (HCC)   . UTI (urinary tract infection)     PAST SURGICAL HISTORY: Past Surgical History:  Procedure Laterality Date  . ABDOMINAL HYSTERECTOMY    . CHOLECYSTECTOMY    . ESOPHAGOGASTRODUODENOSCOPY (EGD) WITH PROPOFOL N/A 11/23/2017   Procedure: ESOPHAGOGASTRODUODENOSCOPY (EGD) WITH PROPOFOL;  Surgeon: Toledo, Benay Pike, MD;  Location: [facility] END[facility]COPY;  Service: Gastroenterology;  Laterality: N/A;  . ESOPHAGOGASTRODUODENOSCOPY (EGD) WITH PROPOFOL N/A 10/23/2018   Procedure: ESOPHAGOGASTRODUODENOSCOPY (EGD) WITH PROPOFOL;  Surgeon: Toledo, Benay Pike, MD;  Location: [facility];  Service: Gastroenterology;  Laterality: N/A;  . ESOPHAGOGASTRODUODENOSCOPY (EGD) WITH PROPOFOL N/A 10/10/2019   Procedure: ESOPHAGOGASTRODUODENOSCOPY (EGD) WITH PROPOFOL;  Surgeon: Toledo, Benay Pike, MD;  Location: [facility]COPY;  Service: Gastroenterology;  Laterality: N/A;  . ESOPHAGOGASTRODUODENOSCOPY (EGD) WITH PROPOFOL N/A 11/14/2019   Procedure: ESOPHAGOGASTRODUODENOSCOPY (EGD) WITH PROPOFOL;  Surgeon: Toledo, Benay Pike, MD;  Location: [facility] ENDOSCOPY;  Service: Gastroenterology;  Laterality: N/A;  . right elbow surgery    . WISDOM TOOTH EXTRACTION      FAMILY HISTORY: Family History  Problem Relation Age of Onset  . Diabetes Mother   . Heart attack Mother   . Hypertension Father   . Thyroid disease Sister   . Sickle cell anemia Sister   . Breast cancer Neg Hx     ADVANCED DIRECTIVES (Y/N):  N  HEALTH MAINTENANCE: Social History   Tobacco Use  . Smoking status: Never Smoker  . Smokeless tobacco: Never Used  Substance Use Topics  . Alcohol use: Not Currently  . Drug use: Never      Colonoscopy:  PAP:  Bone density:  Lipid panel:  Allergies  Allergen Reactions  . Penicillins Anaphylaxis  . Amlodipine Besylate Itching  . Metronidazole Other (See Comments)    Patient states that she felt "foggy" and "loopy" after taking medication.  . Diphenhydramine-Acetaminophen Other (See Comments)    Made my breast hurt.  . Enalapril   . Furosemide   . Sulfa Antibiotics     Reaction unknown.  . Tylenol [Acetaminophen] Other (See Comments)    Breast pain  . Codeine Itching  . Latex Rash    Current Outpatient Medications  Medication Sig Dispense Refill  . clarithromycin (BIAXIN) 500 MG tablet Take 500 mg by mouth 2 (two) times daily.    . clobetasol ointment (TEMOVATE) 0.05 % APP TOPICALLY AA BID  1  . famotidine (PEPCID) 20 MG tablet Take 20 mg by mouth 2 (two) times daily.    Marland Kitchen lactulose (CHRONULAC) 10 GM/15ML solution Take 45 mLs (30 g total) by mouth 2 (two) times daily. 240 mL 0  . nadolol (CORGARD) 20 MG tablet Take 20 mg by mouth daily.    . rifaximin (XIFAXAN) 550 MG TABS tablet Take by mouth.    . spironolactone (ALDACTONE) 100 MG tablet Take 100 mg by mouth daily.     . traZODone (DESYREL) 50 MG tablet Take 50 mg by mouth at bedtime.    . ursodiol (ACTIGALL) 300 MG capsule Take 300 mg by mouth 2 (two) times daily.    . metFORMIN (GLUCOPHAGE) 1000 MG tablet Take 1,000 mg by mouth 2 (two) times daily.    . metroNIDAZOLE (FLAGYL) 250 MG tablet     . omeprazole (PRILOSEC) 20 MG capsule     . ondansetron (ZOFRAN) 4 MG tablet      No current facility-administered medications for this visit.    OBJECTIVE: Vitals:   11/29/19 1304  BP: 130/65  Pulse: 63  Resp: 18  Temp: 98 F (36.7 C)  SpO2: 100%     Body mass index is 28.61 kg/m.    ECOG FS:0 - Asymptomatic  General: Well-developed, well-nourished, no acute distress. Eyes: Pink conjunctiva, anicteric sclera. HEENT: Normocephalic, moist mucous membranes. Lungs: No audible wheezing or  coughing. Heart: Regular rate and rhythm. Abdomen: Soft, nontender, no obvious distention. Musculoskeletal: No edema, cyanosis, or clubbing. Neuro: Alert, answering all questions appropriately. Cranial nerves grossly intact. Skin: No rashes or petechiae noted. Psych: Normal affect.   LAB RESULTS:  Lab Results  Component Value Date   NA 138 07/21/2018   K 4.1 07/21/2018   CL 112 (H) 07/21/2018   CO2 18 (L) 07/21/2018   GLUCOSE 111 (H) 07/21/2018   BUN 8 07/21/2018   CREATININE 0.66 07/21/2018   CALCIUM 8.6 (L) 07/21/2018   PROT 7.9 07/20/2018   ALBUMIN 3.6 07/20/2018   AST 48 (H) 07/20/2018   ALT 27 07/20/2018   ALKPHOS 91 07/20/2018   BILITOT 2.5 (H) 07/20/2018   GFRNONAA >60 07/21/2018   GFRAA >60 07/21/2018    Lab Results  Component Value Date   WBC 2.2 (L) 11/29/2019   NEUTROABS 1.5 (L) 11/29/2019   HGB 12.3 11/29/2019   HCT 37.4 11/29/2019   MCV 100.8 (H) 11/29/2019   PLT 72 (L) 11/29/2019   Lab Results  Component Value Date   IRON 137 11/29/2019   TIBC 361 11/29/2019   IRONPCTSAT 38 (H) 11/29/2019   Lab Results  Component Value Date   FERRITIN 81 11/29/2019     STUDIES: No results found.  ASSESSMENT: Pancytopenia  PLAN:    1.  Pancytopenia: Multifactorial including cirrhosis and splenomegaly of greater than 17 cm noted on CT scan on November 21, 2017 causing sequestration.  Patient's white blood cell count and platelet count remain decreased, but chronic and unchanged.  Hemoglobin and iron stores are now within normal limits. She was previously noted to have antineutrophil antibodies as well as platelet antibodies but both are likely clinically insignificant given the stability of her laboratory work.  No intervention is needed at this time.  Patient does not require bone marrow biopsy.  2.  Cirrhosis: AFP continues to be within normal limits at 2.6.  Continue follow-up with GI as indicated. 3.  GI bleed: Patient denies any recent bleeding.  Patient's  most recent EGD on October 23, 2018 revealed grade 2 esophageal varices and portal hypertensive gastropathy.  Recommendation was to repeat endoscopy in 1 year. 4.  Iron deficiency anemia: Hemoglobin and iron stores are now within normal limits.  She does not require additional IV Feraheme today.  Patient last received treatment on July 29, 2019.  Return to clinic in 3 months with repeat laboratory work and further evaluation.  Patient expressed understanding and was in agreement with this plan. She also understands that She can call clinic at any time with any questions, concerns, or complaints.    Lloyd Huger, MD   11/29/2019 3:25 PM

## 2019-11-25 ENCOUNTER — Other Ambulatory Visit
Admission: RE | Admit: 2019-11-25 | Discharge: 2019-11-25 | Disposition: A | Discharge status: Home / Self Care | Payer: Federal, State, Local not specified - PPO | Source: Ambulatory Visit | Attending: Gastroenterology | Admitting: Gastroenterology

## 2019-11-25 DIAGNOSIS — K7460 Unspecified cirrhosis of liver: Secondary | ICD-10-CM | POA: Insufficient documentation

## 2019-11-25 DIAGNOSIS — B182 Chronic viral hepatitis C: Secondary | ICD-10-CM | POA: Diagnosis not present

## 2019-11-25 LAB — AMMONIA: Ammonia: 83 umol/L — ABNORMAL HIGH (ref 9–35)

## 2019-11-28 ENCOUNTER — Encounter: Payer: Self-pay | Admitting: Oncology

## 2019-11-28 ENCOUNTER — Other Ambulatory Visit: Payer: Self-pay

## 2019-11-28 DIAGNOSIS — B182 Chronic viral hepatitis C: Secondary | ICD-10-CM | POA: Insufficient documentation

## 2019-11-28 NOTE — Progress Notes (Signed)
Patient pre screened for office appointment, no questions or concerns today. Patient reminded of upcoming appointment time and date. 

## 2019-11-29 ENCOUNTER — Other Ambulatory Visit: Payer: Self-pay

## 2019-11-29 ENCOUNTER — Inpatient Hospital Stay: Payer: Federal, State, Local not specified - PPO

## 2019-11-29 ENCOUNTER — Inpatient Hospital Stay: Payer: Federal, State, Local not specified - PPO | Attending: Oncology | Admitting: Oncology

## 2019-11-29 VITALS — BP 130/65 | HR 63 | Temp 98.0°F | Resp 18 | Wt 166.7 lb

## 2019-11-29 DIAGNOSIS — Z7984 Long term (current) use of oral hypoglycemic drugs: Secondary | ICD-10-CM | POA: Insufficient documentation

## 2019-11-29 DIAGNOSIS — E119 Type 2 diabetes mellitus without complications: Secondary | ICD-10-CM | POA: Insufficient documentation

## 2019-11-29 DIAGNOSIS — D509 Iron deficiency anemia, unspecified: Secondary | ICD-10-CM | POA: Diagnosis not present

## 2019-11-29 DIAGNOSIS — K7460 Unspecified cirrhosis of liver: Secondary | ICD-10-CM | POA: Insufficient documentation

## 2019-11-29 DIAGNOSIS — D61818 Other pancytopenia: Secondary | ICD-10-CM | POA: Insufficient documentation

## 2019-11-29 DIAGNOSIS — E785 Hyperlipidemia, unspecified: Secondary | ICD-10-CM | POA: Diagnosis not present

## 2019-11-29 DIAGNOSIS — R531 Weakness: Secondary | ICD-10-CM | POA: Diagnosis not present

## 2019-11-29 DIAGNOSIS — Z79899 Other long term (current) drug therapy: Secondary | ICD-10-CM | POA: Insufficient documentation

## 2019-11-29 DIAGNOSIS — F329 Major depressive disorder, single episode, unspecified: Secondary | ICD-10-CM | POA: Insufficient documentation

## 2019-11-29 DIAGNOSIS — I10 Essential (primary) hypertension: Secondary | ICD-10-CM | POA: Diagnosis not present

## 2019-11-29 DIAGNOSIS — K766 Portal hypertension: Secondary | ICD-10-CM | POA: Diagnosis not present

## 2019-11-29 DIAGNOSIS — K219 Gastro-esophageal reflux disease without esophagitis: Secondary | ICD-10-CM | POA: Insufficient documentation

## 2019-11-29 DIAGNOSIS — R5381 Other malaise: Secondary | ICD-10-CM | POA: Diagnosis not present

## 2019-11-29 DIAGNOSIS — R5383 Other fatigue: Secondary | ICD-10-CM | POA: Diagnosis not present

## 2019-11-29 LAB — CBC WITH DIFFERENTIAL/PLATELET
Abs Immature Granulocytes: 0 10*3/uL (ref 0.00–0.07)
Basophils Absolute: 0 10*3/uL (ref 0.0–0.1)
Basophils Relative: 1 %
Eosinophils Absolute: 0.1 10*3/uL (ref 0.0–0.5)
Eosinophils Relative: 2 %
HCT: 37.4 % (ref 36.0–46.0)
Hemoglobin: 12.3 g/dL (ref 12.0–15.0)
Immature Granulocytes: 0 %
Lymphocytes Relative: 20 %
Lymphs Abs: 0.5 10*3/uL — ABNORMAL LOW (ref 0.7–4.0)
MCH: 33.2 pg (ref 26.0–34.0)
MCHC: 32.9 g/dL (ref 30.0–36.0)
MCV: 100.8 fL — ABNORMAL HIGH (ref 80.0–100.0)
Monocytes Absolute: 0.3 10*3/uL (ref 0.1–1.0)
Monocytes Relative: 11 %
Neutro Abs: 1.5 10*3/uL — ABNORMAL LOW (ref 1.7–7.7)
Neutrophils Relative %: 66 %
Platelets: 72 10*3/uL — ABNORMAL LOW (ref 150–400)
RBC: 3.71 MIL/uL — ABNORMAL LOW (ref 3.87–5.11)
RDW: 15.5 % (ref 11.5–15.5)
WBC: 2.2 10*3/uL — ABNORMAL LOW (ref 4.0–10.5)
nRBC: 0 % (ref 0.0–0.2)

## 2019-11-29 LAB — FERRITIN: Ferritin: 81 ng/mL (ref 11–307)

## 2019-11-29 LAB — IRON AND TIBC
Iron: 137 ug/dL (ref 28–170)
Saturation Ratios: 38 % — ABNORMAL HIGH (ref 10.4–31.8)
TIBC: 361 ug/dL (ref 250–450)
UIBC: 224 ug/dL

## 2020-02-22 NOTE — Progress Notes (Deleted)
[place]  Telephone:([phone] Fax:(336) [phone]  ID: [name] OB: April 05, 1956  MR#: [id number]  WPY#:[id number]  Patient Care Team: Maryland Pink, MD as PCP - General (Family Medicine) Lloyd Huger, MD as Consulting Physician (Oncology)  CHIEF COMPLAINT: Pancytopenia, iron deficiency anemia.  INTERVAL HISTORY: Patient returns to clinic today for repeat laboratory, further evaluation, consideration of additional IV iron.  She has increased weakness and fatigue, but attributes this to recent issues with her liver disease and banding procedure.  She otherwise feels well. She has no neurologic complaints.  She denies any recent fevers.  She has a good appetite and denies weight loss.  She denies any chest pain, cough, hemoptysis, or shortness of breath.  She denies any abdominal pain. She denies any nausea, vomiting, constipation, or diarrhea.  She has no melena or hematochezia. She has no urinary complaints.  Patient offers no further specific complaints today.  REVIEW OF SYSTEMS:   Review of Systems  Constitutional: Positive for malaise/fatigue. Negative for fever and weight loss.  Respiratory: Negative.  Negative for cough, hemoptysis and shortness of breath.   Cardiovascular: Negative.  Negative for chest pain and leg swelling.  Gastrointestinal: Negative.  Negative for abdominal pain, blood in stool and melena.  Genitourinary: Negative.  Negative for dysuria and hematuria.  Musculoskeletal: Negative.  Negative for back pain.  Skin: Negative.  Negative for rash.  Neurological: Positive for weakness. Negative for sensory change and focal weakness.  Psychiatric/Behavioral: Negative.  The patient is not nervous/anxious.     As per HPI. Otherwise, a complete review of systems is negative.  PAST MEDICAL HISTORY: Past Medical History:  Diagnosis Date  . Allergic genetic state   . Aortic ejection murmur   . Depression   . Diabetes mellitus  without complication ([place])   . Dysrhythmia   . GERD (gastroesophageal reflux disease)   . Headache   . Hepatic cirrhosis ([place])   . Hepatitis C   . Hernia, abdominal   . Hyperlipidemia   . Hypertension   . Irregular heart rhythm   . Liver disease   . Palpitations   . Portal hypertension with esophageal varices (HCC)   . UTI (urinary tract infection)     PAST SURGICAL HISTORY: Past Surgical History:  Procedure Laterality Date  . ABDOMINAL HYSTERECTOMY    . CHOLECYSTECTOMY    . ESOPHAGOGASTRODUODENOSCOPY (EGD) WITH PROPOFOL N/A 11/23/2017   Procedure: ESOPHAGOGASTRODUODENOSCOPY (EGD) WITH PROPOFOL;  Surgeon: Toledo, Benay Pike, MD;  Location: [facility];  Service: Gastroenterology;  Laterality: N/A;  . ESOPHAGOGASTRODUODENOSCOPY (EGD) WITH PROPOFOL N/A 10/23/2018   Procedure: ESOPHAGOGASTRODUODENOSCOPY (EGD) WITH PROPOFOL;  Surgeon: Toledo, Benay Pike, MD;  Location: [facility];  Service: Gastroenterology;  Laterality: N/A;  . ESOPHAGOGASTRODUODENOSCOPY (EGD) WITH PROPOFOL N/A 10/10/2019   Procedure: ESOPHAGOGASTRODUODENOSCOPY (EGD) WITH PROPOFOL;  Surgeon: Toledo, Benay Pike, MD;  Location: [facility];  Service: Gastroenterology;  Laterality: N/A;  . ESOPHAGOGASTRODUODENOSCOPY (EGD) WITH PROPOFOL N/A 11/14/2019   Procedure: ESOPHAGOGASTRODUODENOSCOPY (EGD) WITH PROPOFOL;  Surgeon: Toledo, Benay Pike, MD;  Location: [facility];  Service: Gastroenterology;  Laterality: N/A;  . right elbow surgery    . WISDOM TOOTH EXTRACTION      FAMILY HISTORY: Family History  Problem Relation Age of Onset  . Diabetes Mother   . Heart attack Mother   . Hypertension Father   . Thyroid disease Sister   . Sickle cell anemia Sister   . Breast cancer Neg Hx     ADVANCED DIRECTIVES (Y/N):  N  HEALTH MAINTENANCE: Social History   Tobacco Use  . Smoking status: Never Smoker  . Smokeless tobacco: Never Used  Substance Use Topics  . Alcohol use: Not Currently  . Drug use: Never      Colonoscopy:  PAP:  Bone density:  Lipid panel:  Allergies  Allergen Reactions  . Penicillins Anaphylaxis  . Amlodipine Besylate Itching  . Metronidazole Other (See Comments)    Patient states that she felt "foggy" and "loopy" after taking medication.  . Diphenhydramine-Acetaminophen Other (See Comments)    Made my breast hurt.  . Enalapril   . Furosemide   . Sulfa Antibiotics     Reaction unknown.  . Tylenol [Acetaminophen] Other (See Comments)    Breast pain  . Codeine Itching  . Latex Rash    Current Outpatient Medications  Medication Sig Dispense Refill  . clarithromycin (BIAXIN) 500 MG tablet Take 500 mg by mouth 2 (two) times daily.    . clobetasol ointment (TEMOVATE) 0.05 % APP TOPICALLY AA BID  1  . famotidine (PEPCID) 20 MG tablet Take 20 mg by mouth 2 (two) times daily.    Marland Kitchen lactulose (CHRONULAC) 10 GM/15ML solution Take 45 mLs (30 g total) by mouth 2 (two) times daily. 240 mL 0  . metFORMIN (GLUCOPHAGE) 1000 MG tablet Take 1,000 mg by mouth 2 (two) times daily.    . metroNIDAZOLE (FLAGYL) 250 MG tablet     . nadolol (CORGARD) 20 MG tablet Take 20 mg by mouth daily.    Marland Kitchen omeprazole (PRILOSEC) 20 MG capsule     . ondansetron (ZOFRAN) 4 MG tablet     . rifaximin (XIFAXAN) 550 MG TABS tablet Take by mouth.    . spironolactone (ALDACTONE) 100 MG tablet Take 100 mg by mouth daily.     . traZODone (DESYREL) 50 MG tablet Take 50 mg by mouth at bedtime.    . ursodiol (ACTIGALL) 300 MG capsule Take 300 mg by mouth 2 (two) times daily.     No current facility-administered medications for this visit.    OBJECTIVE: There were no vitals filed for this visit.   There is no height or weight on file to calculate BMI.    ECOG FS:0 - Asymptomatic  General: Well-developed, well-nourished, no acute distress. Eyes: Pink conjunctiva, anicteric sclera. HEENT: Normocephalic, moist mucous membranes. Lungs: No audible wheezing or coughing. Heart: Regular rate and  rhythm. Abdomen: Soft, nontender, no obvious distention. Musculoskeletal: No edema, cyanosis, or clubbing. Neuro: Alert, answering all questions appropriately. Cranial nerves grossly intact. Skin: No rashes or petechiae noted. Psych: Normal affect.   LAB RESULTS:  Lab Results  Component Value Date   NA 138 07/21/2018   K 4.1 07/21/2018   CL 112 (H) 07/21/2018   CO2 18 (L) 07/21/2018   GLUCOSE 111 (H) 07/21/2018   BUN 8 07/21/2018   CREATININE 0.66 07/21/2018   CALCIUM 8.6 (L) 07/21/2018   PROT 7.9 07/20/2018   ALBUMIN 3.6 07/20/2018   AST 48 (H) 07/20/2018   ALT 27 07/20/2018   ALKPHOS 91 07/20/2018   BILITOT 2.5 (H) 07/20/2018   GFRNONAA >60 07/21/2018   GFRAA >60 07/21/2018    Lab Results  Component Value Date   WBC 2.2 (L) 11/29/2019   NEUTROABS 1.5 (L) 11/29/2019   HGB 12.3 11/29/2019   HCT 37.4 11/29/2019   MCV 100.8 (H) 11/29/2019   PLT 72 (L) 11/29/2019   Lab Results  Component Value Date   IRON 137 11/29/2019   TIBC 361  11/29/2019   IRONPCTSAT 38 (H) 11/29/2019   Lab Results  Component Value Date   FERRITIN 81 11/29/2019     STUDIES: No results found.  ASSESSMENT: Pancytopenia  PLAN:    1.  Pancytopenia: Multifactorial including cirrhosis and splenomegaly of greater than 17 cm noted on CT scan on November 21, 2017 causing sequestration.  Patient's white blood cell count and platelet count remain decreased, but chronic and unchanged.  Hemoglobin and iron stores are now within normal limits. She was previously noted to have antineutrophil antibodies as well as platelet antibodies but both are likely clinically insignificant given the stability of her laboratory work.  No intervention is needed at this time.  Patient does not require bone marrow biopsy.  2.  Cirrhosis: AFP continues to be within normal limits at 2.6.  Continue follow-up with GI as indicated. 3.  GI bleed: Patient denies any recent bleeding.  Patient's most recent EGD on October 23, 2018 revealed grade 2 esophageal varices and portal hypertensive gastropathy.  Recommendation was to repeat endoscopy in 1 year. 4.  Iron deficiency anemia: Hemoglobin and iron stores are now within normal limits.  She does not require additional IV Feraheme today.  Patient last received treatment on July 29, 2019.  Return to clinic in 3 months with repeat laboratory work and further evaluation.  Patient expressed understanding and was in agreement with this plan. She also understands that She can call clinic at any time with any questions, concerns, or complaints.    Lloyd Huger, MD   02/22/2020 8:01 AM

## 2020-02-28 ENCOUNTER — Inpatient Hospital Stay: Payer: Federal, State, Local not specified - PPO | Admitting: Oncology

## 2020-02-28 ENCOUNTER — Inpatient Hospital Stay: Payer: Federal, State, Local not specified - PPO

## 2020-02-28 DIAGNOSIS — D509 Iron deficiency anemia, unspecified: Secondary | ICD-10-CM

## 2020-02-28 DIAGNOSIS — D61818 Other pancytopenia: Secondary | ICD-10-CM

## 2020-03-03 ENCOUNTER — Other Ambulatory Visit: Payer: Self-pay | Admitting: Family Medicine

## 2020-03-03 DIAGNOSIS — N289 Disorder of kidney and ureter, unspecified: Secondary | ICD-10-CM

## 2020-03-03 DIAGNOSIS — R935 Abnormal findings on diagnostic imaging of other abdominal regions, including retroperitoneum: Secondary | ICD-10-CM

## 2020-03-11 ENCOUNTER — Telehealth: Payer: Self-pay | Admitting: Oncology

## 2020-03-11 NOTE — Telephone Encounter (Signed)
Pt walked in clinic to reschedule missed appt. I attempted to contact pt immediately and she forwarded me to VM. I left pt my direct number to contact me to reschedule.

## 2020-03-13 ENCOUNTER — Other Ambulatory Visit: Payer: Self-pay | Admitting: Family Medicine

## 2020-03-13 DIAGNOSIS — Z1231 Encounter for screening mammogram for malignant neoplasm of breast: Secondary | ICD-10-CM

## 2020-03-14 NOTE — Progress Notes (Signed)
[place]  [phone] [phone]  ID: Roberta Lane OB: 02-09-1956  MR#: [id number]  TMB#:[id number]  Patient Care Team: [place] Pink, MD as PCP - General (Family Medicine) Lloyd Huger, MD as Consulting Physician (Oncology)  CHIEF COMPLAINT: Pancytopenia, iron deficiency anemia.  INTERVAL HISTORY: Patient returns to clinic today for repeat laboratory work, further evaluation, and consideration of additional IV iron.  She continues to have chronic weakness and fatigue.  Patient underwent hernia repair surgery earlier this spring.  She otherwise feels well. She has no neurologic complaints.  She denies any recent fevers or illnesses.  She has a good appetite and denies weight loss.  She denies any chest pain, cough, hemoptysis, or shortness of breath.  She denies any abdominal pain. She denies any nausea, vomiting, constipation, or diarrhea.  She has no melena or hematochezia. She has no urinary complaints.  Patient offers no further specific complaints today.  REVIEW OF SYSTEMS:   Review of Systems  Constitutional: Positive for malaise/fatigue. Negative for fever and weight loss.  Respiratory: Negative.  Negative for cough, hemoptysis and shortness of breath.   Cardiovascular: Negative.  Negative for chest pain and leg swelling.  Gastrointestinal: Negative.  Negative for abdominal pain, blood in stool and melena.  Genitourinary: Negative.  Negative for dysuria and hematuria.  Musculoskeletal: Negative.  Negative for back pain.  Skin: Negative.  Negative for rash.  Neurological: Positive for weakness. Negative for sensory change and focal weakness.  Psychiatric/Behavioral: Negative.  The patient is not nervous/anxious.     As per HPI. Otherwise, a complete review of systems is negative.  PAST MEDICAL HISTORY: Past Medical History:  Diagnosis Date  . Allergic genetic state   . Aortic ejection murmur   . Depression   . Diabetes  mellitus without complication ([place])   . Dysrhythmia   . GERD (gastroesophageal reflux disease)   . Headache   . Hepatic cirrhosis ([place])   . Hepatitis C   . Hernia, abdominal   . Hyperlipidemia   . Hypertension   . Irregular heart rhythm   . Liver disease   . Palpitations   . Portal hypertension with esophageal varices ([facility])   . UTI (urinary tract infection)     PAST SURGICAL HISTORY: Past Surgical History:  Procedure Laterality Date  . ABDOMINAL HYSTERECTOMY    . CHOLECYSTECTOMY    . ESOPHAGOGASTRODUODENOSCOPY (EGD) WITH PROPOFOL N/A 11/23/2017   Procedure: ESOPHAGOGASTRODUODENOSCOPY (EGD) WITH PROPOFOL;  Surgeon: Toledo, Benay Pike, MD;  Location: [facility] ENDOSCOPY;  Service: Gastroenterology;  Laterality: N/A;  . ESOPHAGOGASTRODUODENOSCOPY (EGD) WITH PROPOFOL N/A 10/23/2018   Procedure: ESOPHAGOGASTRODUODENOSCOPY (EGD) WITH PROPOFOL;  Surgeon: Toledo, Benay Pike, MD;  Location: [facility] ENDOSCOPY;  Service: Gastroenterology;  Laterality: N/A;  . ESOPHAGOGASTRODUODENOSCOPY (EGD) WITH PROPOFOL N/A 10/10/2019   Procedure: ESOPHAGOGASTRODUODENOSCOPY (EGD) WITH PROPOFOL;  Surgeon: Toledo, Benay Pike, MD;  Location: [facility] ENDOSCOPY;  Service: Gastroenterology;  Laterality: N/A;  . ESOPHAGOGASTRODUODENOSCOPY (EGD) WITH PROPOFOL N/A 11/14/2019   Procedure: ESOPHAGOGASTRODUODENOSCOPY (EGD) WITH PROPOFOL;  Surgeon: Toledo, Benay Pike, MD;  Location: [facility] ENDOSCOPY;  Service: Gastroenterology;  Laterality: N/A;  . right elbow surgery    . WISDOM TOOTH EXTRACTION      FAMILY HISTORY: Family History  Problem Relation Age of Onset  . Diabetes Mother   . Heart attack Mother   . Hypertension Father   . Thyroid disease Sister   . Sickle cell anemia Sister   . Breast cancer Neg Hx     ADVANCED DIRECTIVES (Y/N):  N  HEALTH MAINTENANCE: Social History   Tobacco Use  . Smoking status: Never Smoker  . Smokeless tobacco: Never Used  Substance Use Topics  . Alcohol use: Not Currently  . Drug use:  Never     Colonoscopy:  PAP:  Bone density:  Lipid panel:  Allergies  Allergen Reactions  . Penicillins Anaphylaxis  . Amlodipine Besylate Itching  . Metronidazole Other (See Comments)    Patient states that she felt "foggy" and "loopy" after taking medication.  . Diphenhydramine-Acetaminophen Other (See Comments)    Made my breast hurt.  . Enalapril   . Furosemide   . Sulfa Antibiotics     Reaction unknown.  . Tylenol [Acetaminophen] Other (See Comments)    Breast pain  . Codeine Itching  . Latex Rash    Current Outpatient Medications  Medication Sig Dispense Refill  . clarithromycin (BIAXIN) 500 MG tablet Take 500 mg by mouth 2 (two) times daily.    . clobetasol ointment (TEMOVATE) 0.05 % APP TOPICALLY AA BID  1  . famotidine (PEPCID) 20 MG tablet Take 20 mg by mouth 2 (two) times daily.    Marland Kitchen lactulose (CHRONULAC) 10 GM/15ML solution Take 45 mLs (30 g total) by mouth 2 (two) times daily. 240 mL 0  . metFORMIN (GLUCOPHAGE) 1000 MG tablet Take 1,000 mg by mouth 2 (two) times daily.    . metroNIDAZOLE (FLAGYL) 250 MG tablet     . nadolol (CORGARD) 20 MG tablet Take 20 mg by mouth daily.    Marland Kitchen omeprazole (PRILOSEC) 20 MG capsule     . ondansetron (ZOFRAN) 4 MG tablet     . rifaximin (XIFAXAN) 550 MG TABS tablet Take by mouth.    . traZODone (DESYREL) 50 MG tablet Take 50 mg by mouth at bedtime.    . ursodiol (ACTIGALL) 300 MG capsule Take 300 mg by mouth 2 (two) times daily.     No current facility-administered medications for this visit.    OBJECTIVE: Vitals:   03/19/20 1436  BP: 103/73  Pulse: 71  Temp: (!) 97 F (36.1 C)  SpO2: 100%     Body mass index is 27.07 kg/m.    ECOG FS:0 - Asymptomatic  General: Well-developed, well-nourished, no acute distress. Eyes: Pink conjunctiva, anicteric sclera. HEENT: Normocephalic, moist mucous membranes. Lungs: No audible wheezing or coughing. Heart: Regular rate and rhythm. Abdomen: Soft, nontender, no obvious  distention. Musculoskeletal: No edema, cyanosis, or clubbing. Neuro: Alert, answering all questions appropriately. Cranial nerves grossly intact. Skin: No rashes or petechiae noted. Psych: Normal affect.  LAB RESULTS:  Lab Results  Component Value Date   NA 138 07/21/2018   K 4.1 07/21/2018   CL 112 (H) 07/21/2018   CO2 18 (L) 07/21/2018   GLUCOSE 111 (H) 07/21/2018   BUN 8 07/21/2018   CREATININE 0.66 07/21/2018   CALCIUM 8.6 (L) 07/21/2018   PROT 7.9 07/20/2018   ALBUMIN 3.6 07/20/2018   AST 48 (H) 07/20/2018   ALT 27 07/20/2018   ALKPHOS 91 07/20/2018   BILITOT 2.5 (H) 07/20/2018   GFRNONAA >60 07/21/2018   GFRAA >60 07/21/2018    Lab Results  Component Value Date   WBC 3.9 (L) 03/19/2020   NEUTROABS 2.6 03/19/2020   HGB 11.3 (L) 03/19/2020   HCT 32.8 (L) 03/19/2020   MCV 97.9 03/19/2020   PLT 79 (L) 03/19/2020   Lab Results  Component Value Date   IRON 110 03/19/2020   TIBC 364 03/19/2020   IRONPCTSAT 30  03/19/2020   Lab Results  Component Value Date   FERRITIN 129 03/19/2020     STUDIES: CT ABDOMEN PELVIS W WO CONTRAST  Result Date: 03/16/2020 CLINICAL DATA:  Right renal lesion on ultrasound. Hernia surgery 4 weeks ago. Hepatitis C and cirrhosis. EXAM: CT ABDOMEN AND PELVIS WITHOUT AND WITH CONTRAST TECHNIQUE: Multidetector CT imaging of the abdomen and pelvis was performed following the standard protocol before and following the bolus administration of intravenous contrast. CONTRAST:  143m OMNIPAQUE IOHEXOL 300 MG/ML  SOLN COMPARISON:  Abdominal ultrasound 10/04/2019 and CT abdomen pelvis 11/21/2017. FINDINGS: Lower chest: Lung bases are clear. Heart is at the upper limits of normal in size to mildly enlarged. No pericardial or pleural effusion. Gastroesophageal varices are present. Hepatobiliary: Liver parenchyma is heterogeneous and margin is markedly irregular. No definitive areas of abnormal arterial phase enhancement. Multiple low-attenuation lesions in  the liver measure up to 1.8 cm in the inferior right hepatic lobe, as before, suggesting cysts. Mild common bile duct dilatation is likely related to cholecystectomy. Pancreas: Negative. Spleen: Measures 16.8 cm. Adrenals/Urinary Tract: Adrenal glands are unremarkable. No urinary stones. An 8 mm lesion off the posterior interpolar left kidney appears slightly larger than on 11/21/2017, previously measuring approximately 5 mm. Lesion appears to enhance, measuring 8 Hounsfield units on precontrast imaging and 61 Hounsfield units on postcontrast imaging with slight washout on nephrographic phase imaging, measuring 38 Hounsfield units. Prominent extrarenal pelves bilaterally. Fluid density lesion in the lower pole left kidney measures 1.7 cm, may have dependent milk of calcium and is stable, indicative of a cyst. Ureters are decompressed. Bladder is grossly unremarkable. Stomach/Bowel: Stomach, small bowel and appendix are unremarkable. Stool is seen throughout the colon, indicative of constipation. Vascular/Lymphatic: Recanalized paraumbilical vein with extensive varices. Mildly prominent portal vein, 1.7 cm. Small to borderline enlarged lymph nodes in the upper abdomen, measuring up to 11 mm in the splenic hilum (11/36). Reproductive: Hysterectomy.  No adnexal mass. Other: Right paramidline omental mass is seen at the level of the umbilicus, measuring 2.7 x 3.2 cm. It is hyperdense and without enhancement but is new from 11/21/2017. Umbilical hernia repair. Mild mesenteric haziness, similar. Trace pelvic ascites. Musculoskeletal: Degenerative changes in the spine. No worrisome lytic or sclerotic lesions. IMPRESSION: 1. 8 mm lesion off the posterior interpolar left kidney, slightly larger than on 11/21/2017. Lesion appears to enhance and is worrisome for renal cell carcinoma. Technically, lesion is small for definitive characterization on CT. Consider MR abdomen without and with contrast in further evaluation, as  clinically indicated. 2. Cirrhosis with portal hypertension (extensive varices, splenomegaly and trace ascites). H[place]surveillance could be performed on MR recommended above. 3. New hyperdense nonenhancing omental lesion at the level of the umbilicus likely represents a mildly complex postoperative seroma related to umbilical hernia repair. 4. Small to borderline enlarged lymph nodes in the upper abdomen, likely reactive. Electronically Signed   By: MLorin PicketM.D.   On: 03/16/2020 08:57    ASSESSMENT: Pancytopenia  PLAN:    1.  Pancytopenia: Multifactorial including cirrhosis and splenomegaly of greater than 17 cm noted on CT scan on November 21, 2017 causing sequestration.  Patient's white blood cell count and platelet count remain decreased, but have trended up slightly in the last several months.  Hemoglobin has trended down, but iron stores continue to be within normal limits.  She was previously noted to have antineutrophil antibodies as well as platelet antibodies but both are likely clinically insignificant given the stability of her laboratory  work.  No intervention is needed.  Patient does not require bone marrow biopsy. 2.  Cirrhosis: AFP continues to be within normal limits at 2.6.  Continue follow-up with GI as indicated. 3.  GI bleed: Patient denies any recent bleeding.  Patient's most recent EGD was in January 2021. 4.  Iron deficiency anemia: Hemoglobin has trended down slightly, but iron stores remain within normal limits.  She does not require additional IV Feraheme today. Patient last received treatment on July 29, 2019.  Return to clinic in 3 months with repeat laboratory work, further evaluation, and consideration of additional treatment if necessary.  Patient expressed understanding and was in agreement with this plan. She also understands that She can call clinic at any time with any questions, concerns, or complaints.    Lloyd Huger, MD   03/19/2020 4:18 PM

## 2020-03-16 ENCOUNTER — Ambulatory Visit
Admission: RE | Admit: 2020-03-16 | Discharge: 2020-03-16 | Disposition: A | Discharge status: Home / Self Care | Payer: Federal, State, Local not specified - PPO | Source: Ambulatory Visit | Attending: Family Medicine | Admitting: Family Medicine

## 2020-03-16 ENCOUNTER — Other Ambulatory Visit: Payer: Self-pay

## 2020-03-16 DIAGNOSIS — N289 Disorder of kidney and ureter, unspecified: Secondary | ICD-10-CM | POA: Insufficient documentation

## 2020-03-16 DIAGNOSIS — R935 Abnormal findings on diagnostic imaging of other abdominal regions, including retroperitoneum: Secondary | ICD-10-CM

## 2020-03-16 IMAGING — CT CT ABD-PEL WO/W CM
3 of 13 series · 11 of 46 positions shown, 17 images · IV contrast (omnipaque)
Comparison: Abdominal ultrasound [DATE] and CT abdomen pelvis
[DATE].

CLINICAL DATA: Right renal lesion on ultrasound. Hernia surgery 4
weeks ago. Hepatitis C and cirrhosis.

EXAM:
CT ABDOMEN AND PELVIS WITHOUT AND WITH CONTRAST
TECHNIQUE: Multidetector CT imaging of the abdomen and pelvis was performed
following the standard protocol before and following the bolus
administration of intravenous contrast.
CONTRAST:  100mL OMNIPAQUE IOHEXOL 300 MG/ML  SOLN

[Series 4: coronal pre · coronal · non-contrast · 0.64mm/px · 2 of 94 slices shown, 3 images]
[im 32/94  soft-tissue]
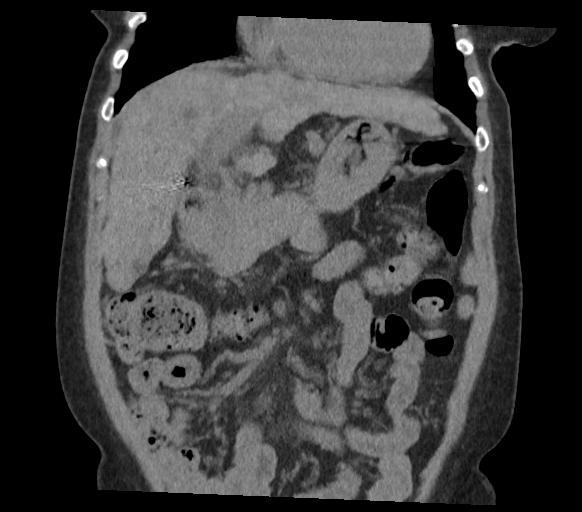
[im 32/94  bone]
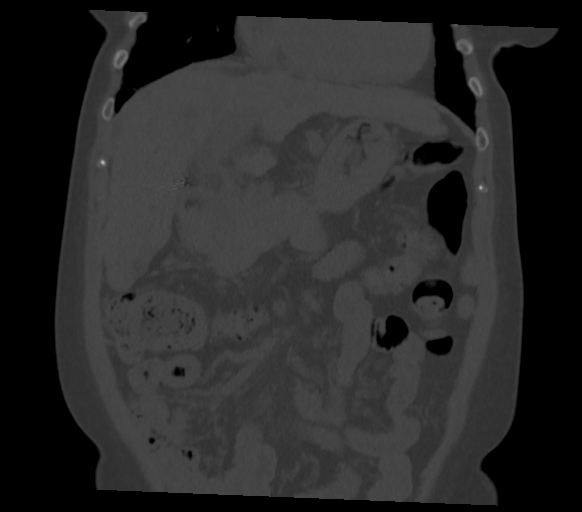
[im 63/94  soft-tissue]
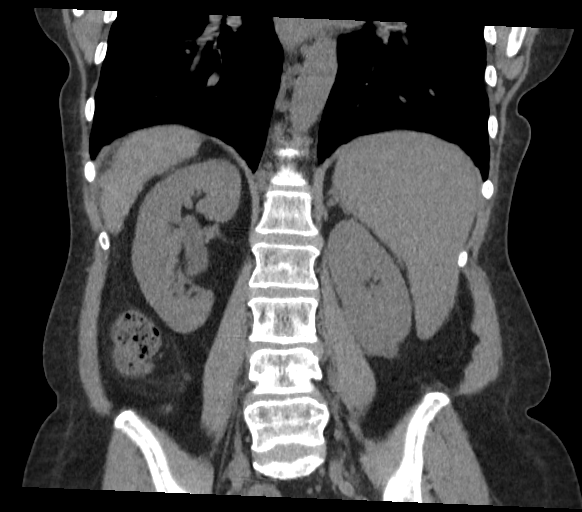

[Series 11: axial nephrographic · axial · 0.75mm/px · z∈[-390,-39]mm · 7 of 157 slices shown, 12 images]
[im 20/157  soft-tissue]
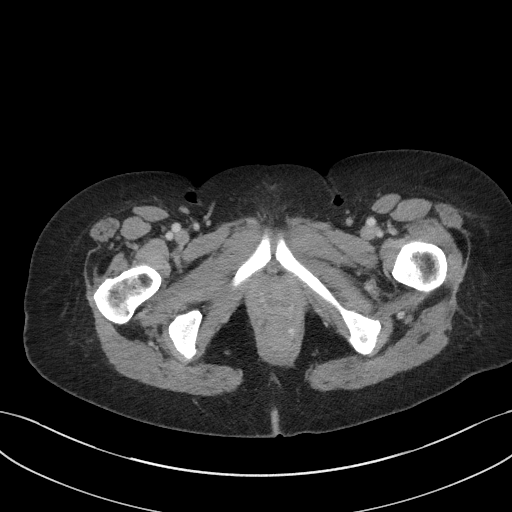
[im 20/157  bone]
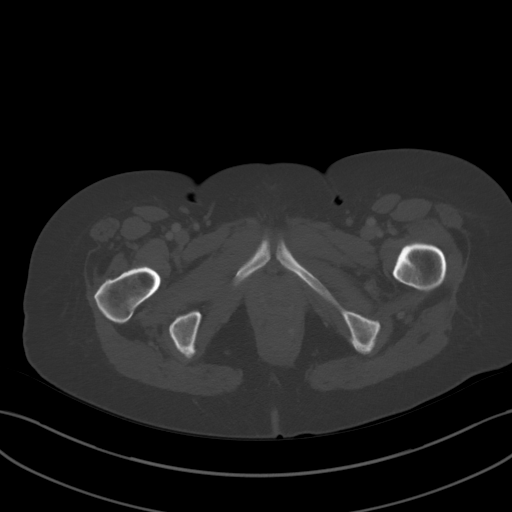
[im 40/157  soft-tissue]
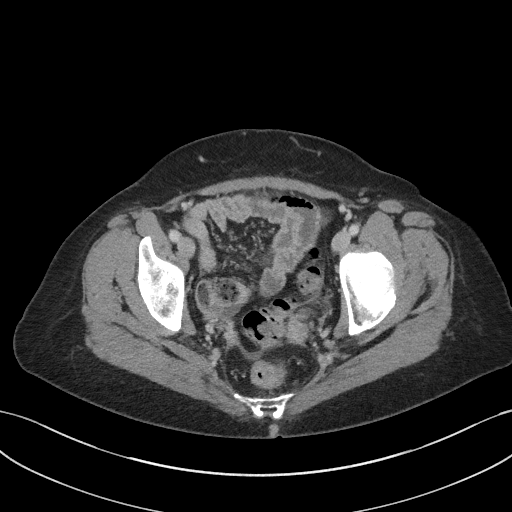
[im 59/157  soft-tissue]
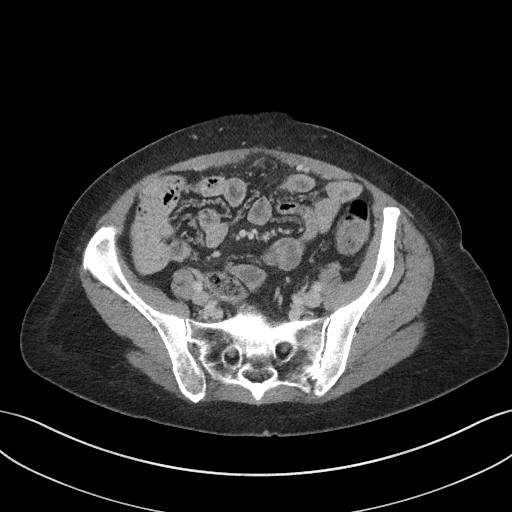
[im 79/157  soft-tissue]
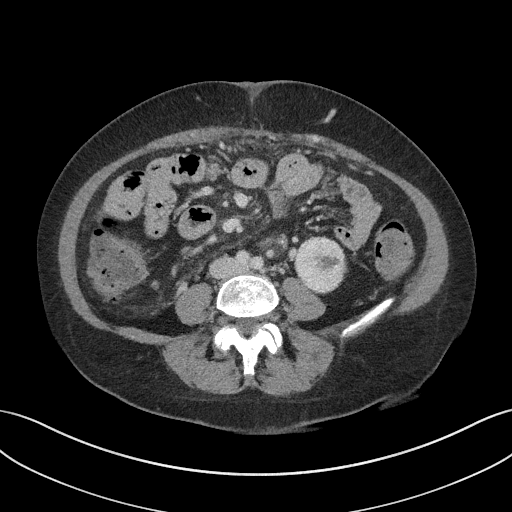
[im 79/157  lung]
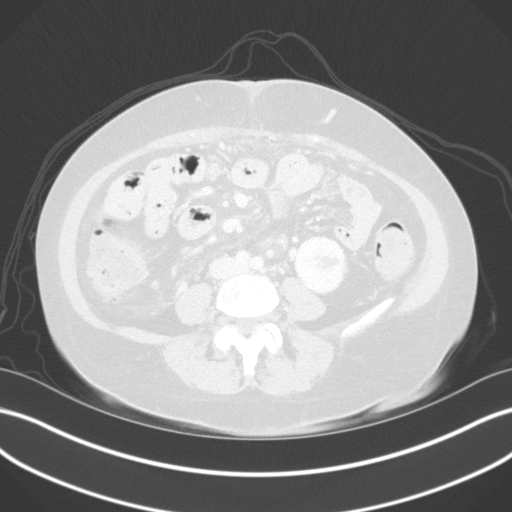
[im 98/157  soft-tissue]
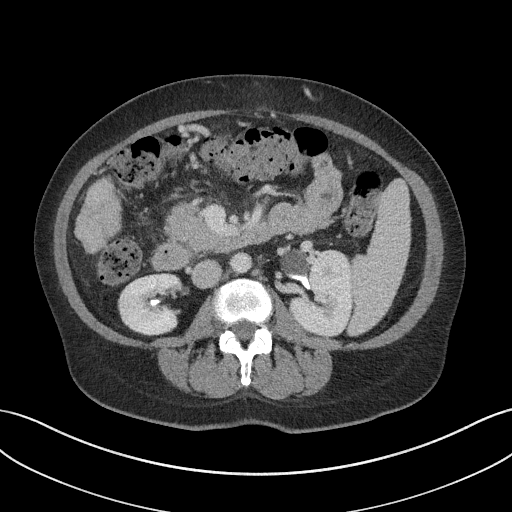
[im 98/157  lung]
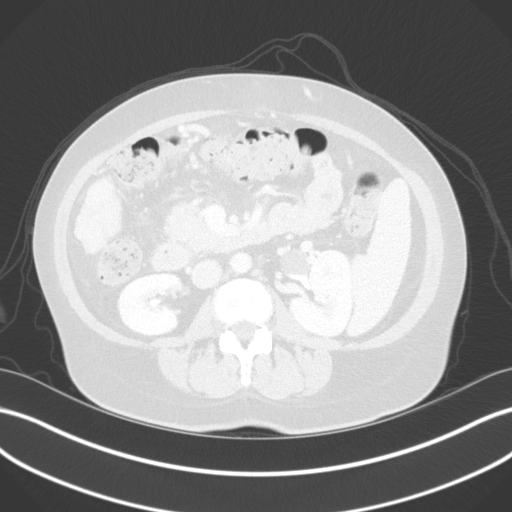
[im 118/157  soft-tissue]
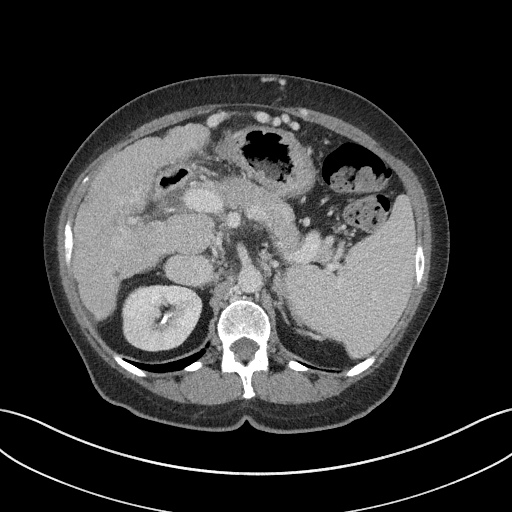
[im 118/157  lung]
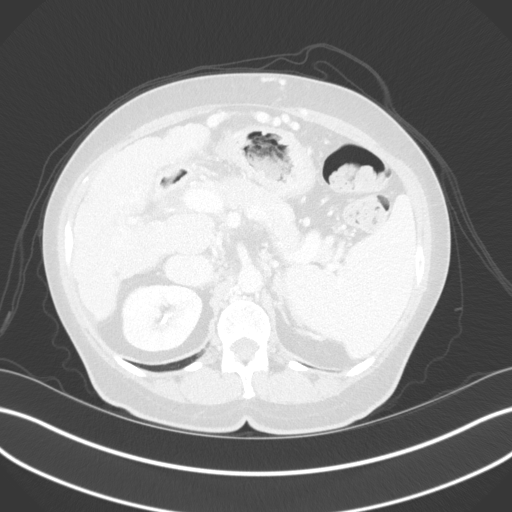
[im 137/157  soft-tissue]
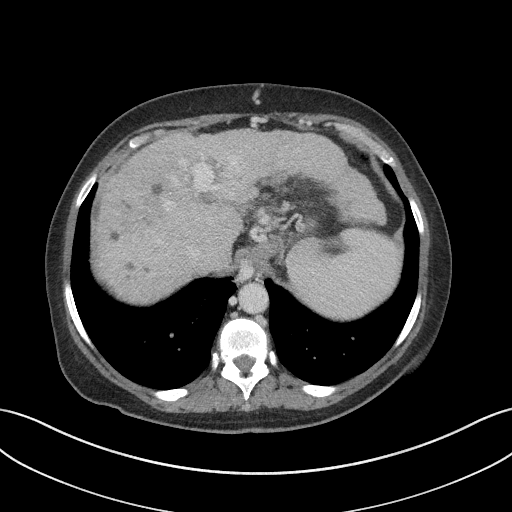
[im 137/157  lung]
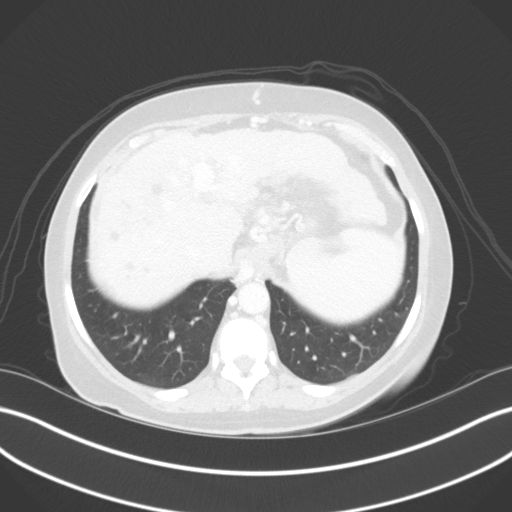

[Series 16: delay · axial · delayed · 0.75mm/px · z∈[-359,-305]mm · 2 of 130 slices shown]
[im 19/130  soft-tissue]
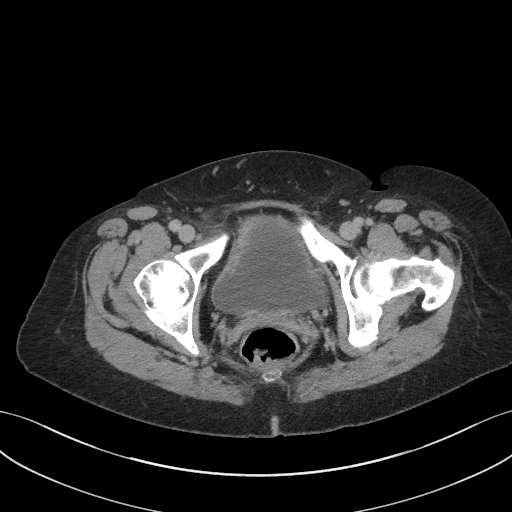
[im 37/130  soft-tissue]
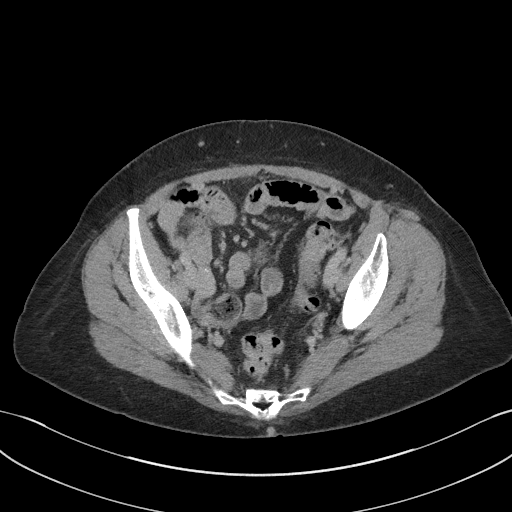

[11 of 46 positions shown; findings below may reference images not displayed]

FINDINGS: Lower chest: Lung bases are clear. Heart is at the upper limits of
normal in size to mildly enlarged. No pericardial or pleural
effusion. Gastroesophageal varices are present.

Hepatobiliary: Liver parenchyma is heterogeneous and margin is
markedly irregular. No definitive areas of abnormal arterial phase
enhancement. Multiple low-attenuation lesions in the liver measure
up to 1.8 cm in the inferior right hepatic lobe, as before,
suggesting cysts. Mild common bile duct dilatation is likely related
to cholecystectomy.

Pancreas: Negative.

Spleen: Measures 16.8 cm.

Adrenals/Urinary Tract: Adrenal glands are unremarkable. No urinary
stones. An 8 mm lesion off the posterior interpolar left kidney
appears slightly larger than on [DATE], previously measuring
approximately 5 mm. Lesion appears to enhance, measuring 8
Hounsfield units on precontrast imaging and 61 Hounsfield units on
postcontrast imaging with slight washout on nephrographic phase
imaging, measuring 38 Hounsfield units. Prominent extrarenal pelves
bilaterally. Fluid density lesion in the lower pole left kidney
measures 1.7 cm, may have dependent milk of calcium and is stable,
indicative of a cyst. Ureters are decompressed. Bladder is grossly
unremarkable.

Stomach/Bowel: Stomach, small bowel and appendix are unremarkable.
Stool is seen throughout the colon, indicative of constipation.

Vascular/Lymphatic: Recanalized paraumbilical vein with extensive
varices. Mildly prominent portal vein, 1.7 cm. Small to borderline
enlarged lymph nodes in the upper abdomen, measuring up to 11 mm in
the splenic hilum (11/36).

Reproductive: Hysterectomy.  No adnexal mass.

Other: Right paramidline omental mass is seen at the level of the
umbilicus, measuring 2.7 x 3.2 cm. It is hyperdense and without
enhancement but is new from [DATE]. Umbilical hernia repair.
Mild mesenteric haziness, similar. Trace pelvic ascites.

Musculoskeletal: Degenerative changes in the spine. No worrisome
lytic or sclerotic lesions.
IMPRESSION: 1. 8 mm lesion off the posterior interpolar left kidney, slightly
larger than on [DATE]. Lesion appears to enhance and is
worrisome for renal cell carcinoma. Technically, lesion is small for
definitive characterization on CT. Consider MR abdomen without and
with contrast in further evaluation, as clinically indicated.
2. Cirrhosis with portal hypertension (extensive varices,
splenomegaly and trace ascites). HCC surveillance could be performed
on MR recommended above.
3. New hyperdense nonenhancing omental lesion at the level of the
umbilicus likely represents a mildly complex postoperative seroma
related to umbilical hernia repair.
4. Small to borderline enlarged lymph nodes in the upper abdomen,
likely reactive.

## 2020-03-16 MED ORDER — IOHEXOL 300 MG/ML  SOLN
100.0000 mL | Freq: Once | INTRAMUSCULAR | Status: AC | PRN
  Administered 2020-03-16: 100 mL via INTRAVENOUS

## 2020-03-17 ENCOUNTER — Other Ambulatory Visit: Payer: Self-pay | Admitting: Family Medicine

## 2020-03-17 DIAGNOSIS — N289 Disorder of kidney and ureter, unspecified: Secondary | ICD-10-CM

## 2020-03-18 ENCOUNTER — Telehealth: Payer: Self-pay | Admitting: Family Medicine

## 2020-03-18 DIAGNOSIS — I10 Essential (primary) hypertension: Secondary | ICD-10-CM | POA: Insufficient documentation

## 2020-03-18 NOTE — Telephone Encounter (Signed)
03/18/20~LM  on mobile vm. Hm vm full. MF

## 2020-03-19 ENCOUNTER — Encounter: Payer: Self-pay | Admitting: Oncology

## 2020-03-19 ENCOUNTER — Inpatient Hospital Stay: Payer: Federal, State, Local not specified - PPO

## 2020-03-19 ENCOUNTER — Inpatient Hospital Stay: Payer: Federal, State, Local not specified - PPO | Attending: Oncology | Admitting: Oncology

## 2020-03-19 VITALS — BP 103/73 | HR 71 | Temp 97.0°F | Wt 157.7 lb

## 2020-03-19 DIAGNOSIS — D61818 Other pancytopenia: Secondary | ICD-10-CM | POA: Diagnosis not present

## 2020-03-19 DIAGNOSIS — I10 Essential (primary) hypertension: Secondary | ICD-10-CM | POA: Diagnosis not present

## 2020-03-19 DIAGNOSIS — Z7984 Long term (current) use of oral hypoglycemic drugs: Secondary | ICD-10-CM | POA: Insufficient documentation

## 2020-03-19 DIAGNOSIS — K7460 Unspecified cirrhosis of liver: Secondary | ICD-10-CM | POA: Insufficient documentation

## 2020-03-19 DIAGNOSIS — K219 Gastro-esophageal reflux disease without esophagitis: Secondary | ICD-10-CM | POA: Insufficient documentation

## 2020-03-19 DIAGNOSIS — E119 Type 2 diabetes mellitus without complications: Secondary | ICD-10-CM | POA: Insufficient documentation

## 2020-03-19 DIAGNOSIS — I8510 Secondary esophageal varices without bleeding: Secondary | ICD-10-CM | POA: Insufficient documentation

## 2020-03-19 DIAGNOSIS — R5381 Other malaise: Secondary | ICD-10-CM | POA: Insufficient documentation

## 2020-03-19 DIAGNOSIS — K766 Portal hypertension: Secondary | ICD-10-CM | POA: Insufficient documentation

## 2020-03-19 DIAGNOSIS — R5383 Other fatigue: Secondary | ICD-10-CM | POA: Insufficient documentation

## 2020-03-19 DIAGNOSIS — E785 Hyperlipidemia, unspecified: Secondary | ICD-10-CM | POA: Diagnosis not present

## 2020-03-19 DIAGNOSIS — D509 Iron deficiency anemia, unspecified: Secondary | ICD-10-CM | POA: Diagnosis not present

## 2020-03-19 DIAGNOSIS — Z79899 Other long term (current) drug therapy: Secondary | ICD-10-CM | POA: Insufficient documentation

## 2020-03-19 DIAGNOSIS — B1920 Unspecified viral hepatitis C without hepatic coma: Secondary | ICD-10-CM | POA: Diagnosis not present

## 2020-03-19 DIAGNOSIS — R531 Weakness: Secondary | ICD-10-CM | POA: Diagnosis not present

## 2020-03-19 LAB — CBC WITH DIFFERENTIAL/PLATELET
Abs Immature Granulocytes: 0.02 10*3/uL (ref 0.00–0.07)
Basophils Absolute: 0 10*3/uL (ref 0.0–0.1)
Basophils Relative: 1 %
Eosinophils Absolute: 0.1 10*3/uL (ref 0.0–0.5)
Eosinophils Relative: 2 %
HCT: 32.8 % — ABNORMAL LOW (ref 36.0–46.0)
Hemoglobin: 11.3 g/dL — ABNORMAL LOW (ref 12.0–15.0)
Immature Granulocytes: 1 %
Lymphocytes Relative: 19 %
Lymphs Abs: 0.8 10*3/uL (ref 0.7–4.0)
MCH: 33.7 pg (ref 26.0–34.0)
MCHC: 34.5 g/dL (ref 30.0–36.0)
MCV: 97.9 fL (ref 80.0–100.0)
Monocytes Absolute: 0.4 10*3/uL (ref 0.1–1.0)
Monocytes Relative: 11 %
Neutro Abs: 2.6 10*3/uL (ref 1.7–7.7)
Neutrophils Relative %: 66 %
Platelets: 79 10*3/uL — ABNORMAL LOW (ref 150–400)
RBC: 3.35 MIL/uL — ABNORMAL LOW (ref 3.87–5.11)
RDW: 15.2 % (ref 11.5–15.5)
WBC: 3.9 10*3/uL — ABNORMAL LOW (ref 4.0–10.5)
nRBC: 0 % (ref 0.0–0.2)

## 2020-03-19 LAB — IRON AND TIBC
Iron: 110 ug/dL (ref 28–170)
Saturation Ratios: 30 % (ref 10.4–31.8)
TIBC: 364 ug/dL (ref 250–450)
UIBC: 254 ug/dL

## 2020-03-19 LAB — FERRITIN: Ferritin: 129 ng/mL (ref 11–307)

## 2020-04-02 ENCOUNTER — Ambulatory Visit
Admission: RE | Admit: 2020-04-02 | Discharge: 2020-04-02 | Disposition: A | Discharge status: Home / Self Care | Payer: Federal, State, Local not specified - PPO | Source: Ambulatory Visit | Attending: Family Medicine | Admitting: Family Medicine

## 2020-04-02 DIAGNOSIS — N289 Disorder of kidney and ureter, unspecified: Secondary | ICD-10-CM | POA: Insufficient documentation

## 2020-04-02 IMAGING — MR MR ABDOMEN WO/W CM
13 series · 48 of 48 positions shown · IV contrast (gadavist)
Comparison: CT from [DATE]

CLINICAL DATA: Evaluate kidney lesion.  Hepatitis C and cirrhosis.

EXAM:
MRI ABDOMEN WITHOUT AND WITH CONTRAST
TECHNIQUE: Multiplanar multisequence MR imaging of the abdomen was performed
both before and after the administration of intravenous contrast.
CONTRAST:  7mL GADAVIST GADOBUTROL 1 MMOL/ML IV SOLN

[Series 2: cor haste · coronal · 6.0mm · 1.19mm/px · 1 of 32 slices shown]
[im 1/32]
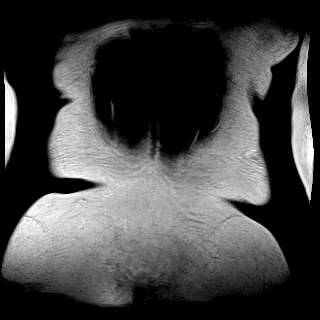

[Series 4: T2 fat-sat · axial · 6.0mm · 1.19mm/px · 1 of 38 slices shown]
[im 1/38]
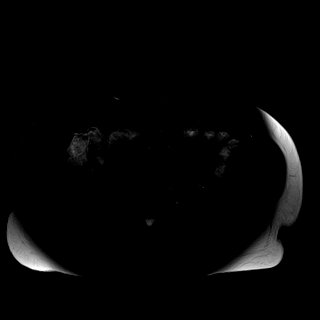

[Series 7: DWI · axial · 6.0mm · 1.42mm/px · 1 of 40 slices shown]
[im 1/40]
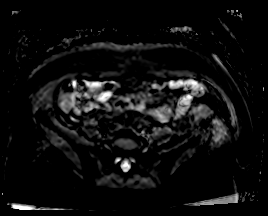

[Series 8: ax in & · axial · 3.0mm · 1.19mm/px · z∈[-115,+146]mm · 4 of 88 slices shown]
[im 1/88]
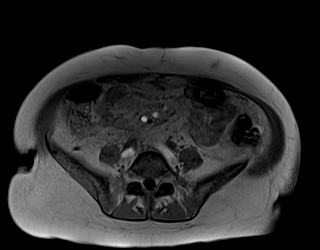
[im 30/88]
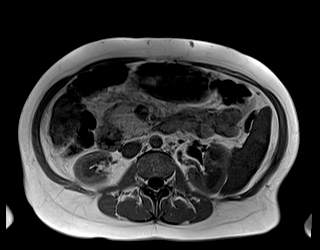
[im 59/88]
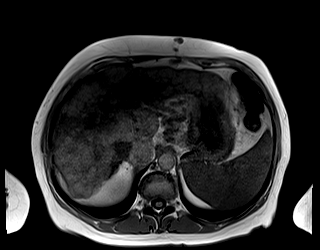
[im 88/88]
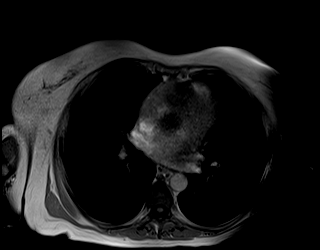

[Series 10: T1 dynamic · axial · non-contrast · 3.0mm · 1.19mm/px · z∈[-115,+146]mm · 5 of 88 slices shown (1 of 6)]
[im 1/88]
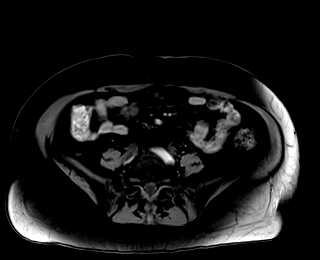
[im 22/88]
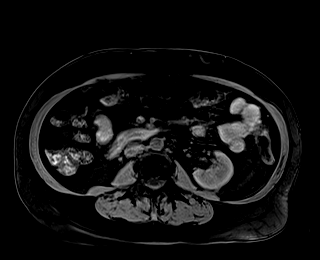
[im 44/88]
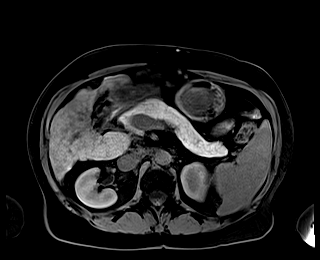
[im 66/88]
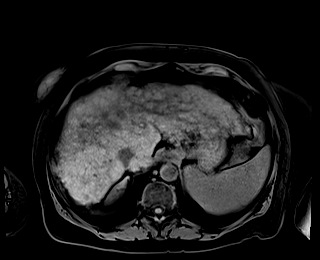
[im 88/88]
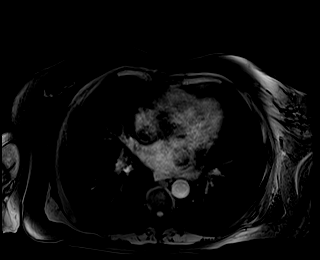

[Series 11: T2 · axial · 6.0mm · 1.19mm/px · z∈[-118,+149]mm · 2 of 38 slices shown]
[im 1/38]
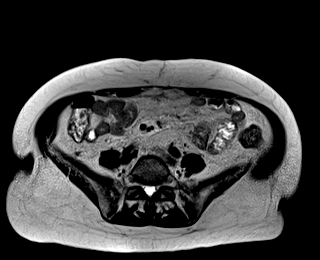
[im 38/38]
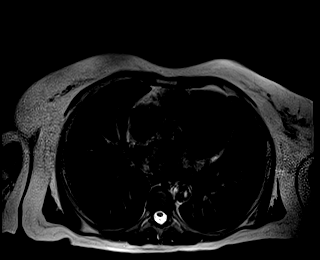

[Series 13: T1 dynamic · axial · 3.0mm · 1.19mm/px · z∈[-115,+146]mm · 5 of 88 slices shown (2 of 6)]
[im 1/88]
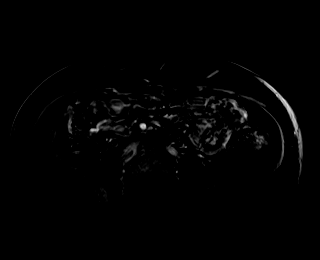
[im 22/88]
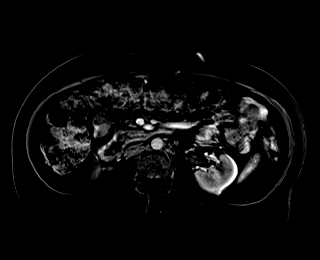
[im 44/88]
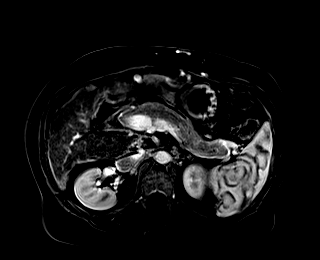
[im 66/88]
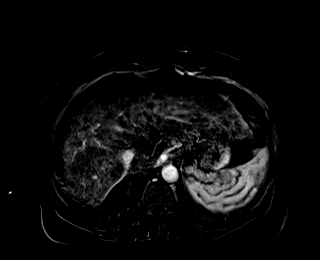
[im 88/88]
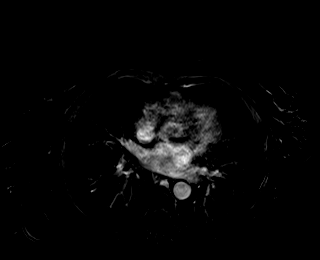

[Series 15: T1 dynamic · axial · 3.0mm · 1.19mm/px · z∈[-115,+146]mm · 5 of 88 slices shown (3 of 6)]
[im 1/88]
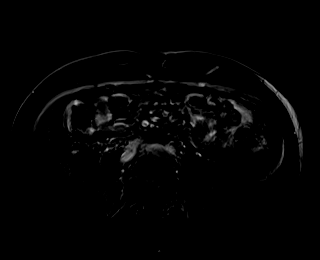
[im 22/88]
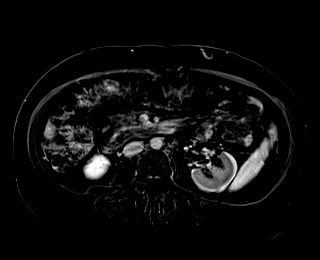
[im 44/88]
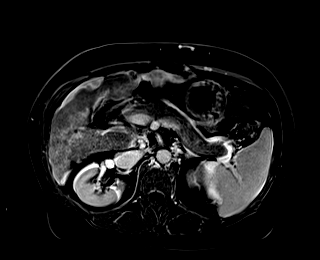
[im 66/88]
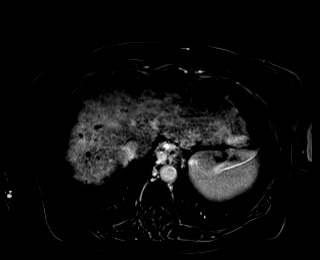
[im 88/88]
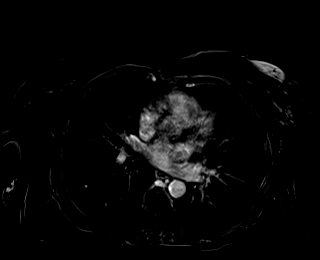

[Series 16: T1 dynamic · axial · 3.0mm · 1.19mm/px · z∈[-115,+146]mm · 5 of 88 slices shown (4 of 6)]
[im 1/88]
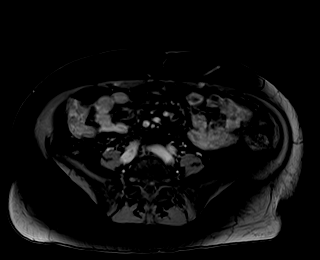
[im 22/88]
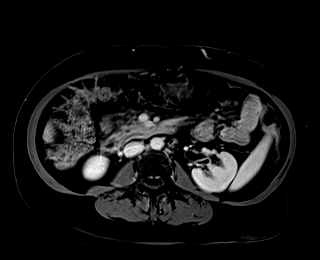
[im 44/88]
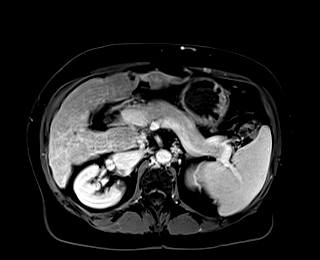
[im 66/88]
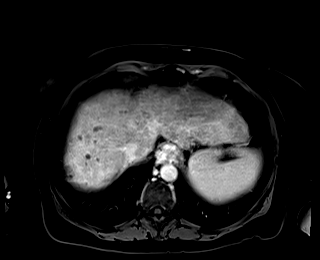
[im 88/88]
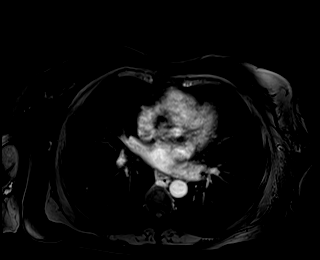

[Series 17: T1 dynamic · axial · 3.0mm · 1.19mm/px · z∈[-115,+146]mm · 5 of 88 slices shown (5 of 6)]
[im 1/88]
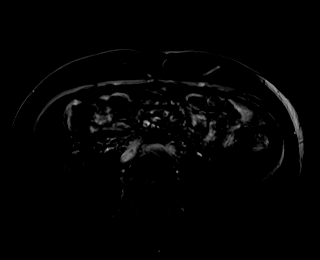
[im 22/88]
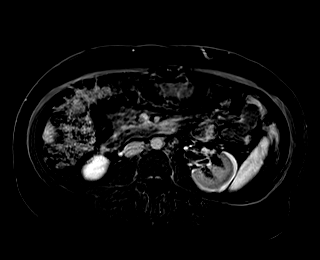
[im 44/88]
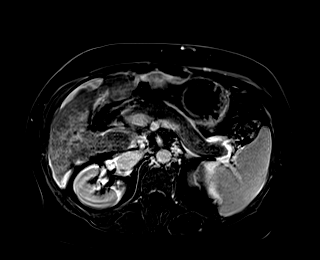
[im 66/88]
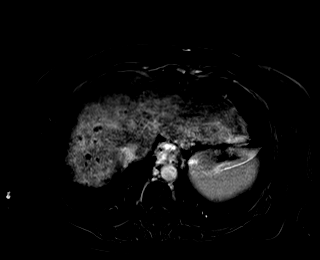
[im 88/88]
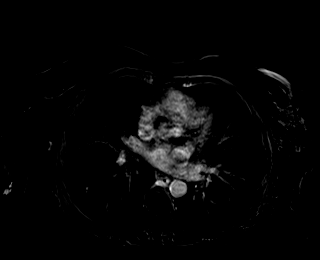

[Series 19: T1 dynamic · axial · 3.0mm · 1.19mm/px · z∈[-115,+146]mm · 5 of 88 slices shown (6 of 6)]
[im 1/88]
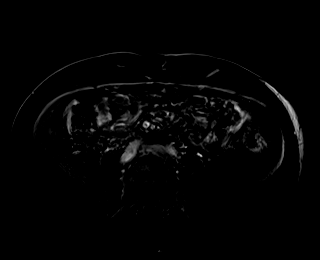
[im 22/88]
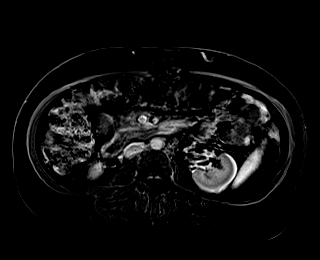
[im 44/88]
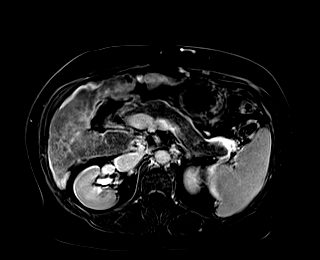
[im 66/88]
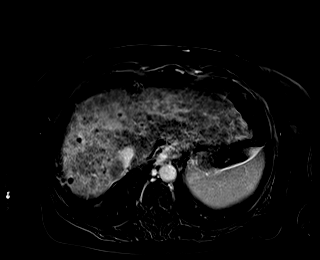
[im 88/88]
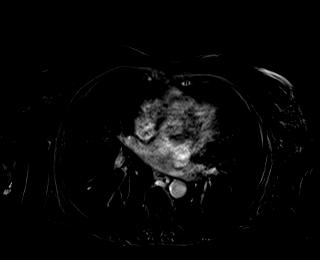

[Series 20: T1 dynamic post-contrast · coronal · 3.0mm · 1.31mm/px · 4 of 80 slices shown]
[im 1/80]
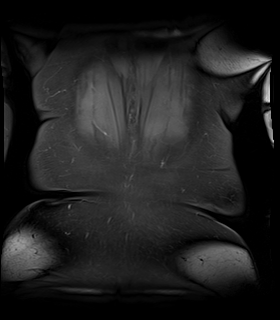
[im 27/80]
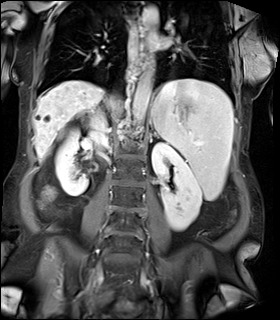
[im 53/80]
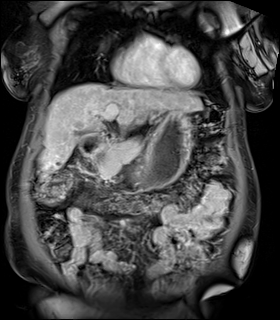
[im 80/80]
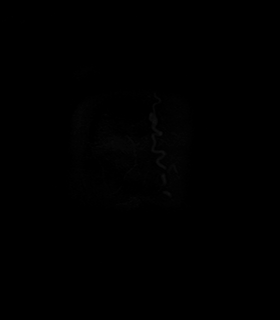

[Series 1011: out of phase · axial · 3.0mm · 1.19mm/px · z∈[-115,+146]mm · 5 of 88 slices shown]
[im 1/88]
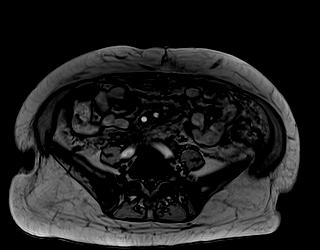
[im 22/88]
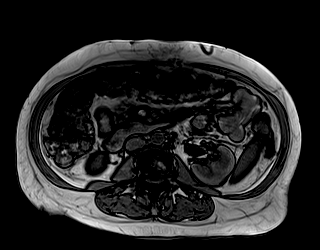
[im 44/88]
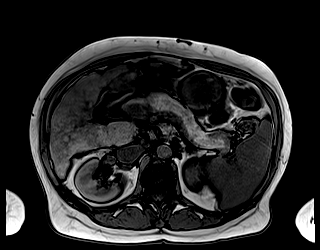
[im 66/88]
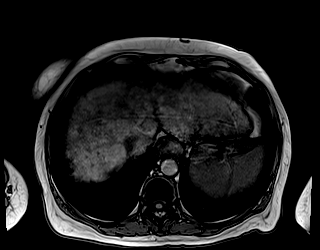
[im 88/88]
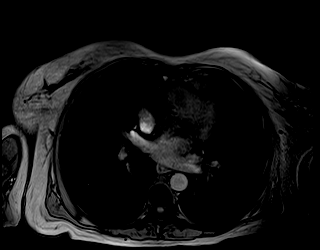

[48 of 48 positions shown; findings below may reference images not displayed]

FINDINGS: Lower chest: No acute findings.

Hepatobiliary: Advanced changes of cirrhosis identified. Multiple
liver cysts are identified. No definite areas of abnormal arterial
phase enhancement. Previous cholecystectomy. No biliary ductal
dilatation. Common bile duct measures up to 5 mm.

Pancreas: No mass, inflammatory changes, or other parenchymal
abnormality identified.

Spleen: The spleen is enlarged measuring 15.4 cm in length. No focal
splenic abnormality.

Adrenals/Urinary Tract: Normal appearance of the adrenal glands.
Small slightly exophytic enhancing lesion is identified arising from
the posterior cortex of the left kidney measuring 8 mm. This is
worrisome for a renal cell carcinoma. Simple appearing cyst within
the inferior pole of left kidney measures 1.4 x 1.1 cm, image 33/9.
No hydronephrosis identified bilaterally.

Stomach/Bowel: Visualized portions within the abdomen are
unremarkable.

Vascular/Lymphatic: Recanalized periumbilical vein with extensive
varices. The portal vein appears prominent. No adenopathy
identified.

Other: Postoperative changes involving the ventral abdominal wall is
again noted. Previously characterized postop seroma within the right
paramidline omentum measures 2.4 by 1.6 cm, image 33/4. Decreased
from 3.1 by 2.5 cm previously.

Musculoskeletal: No suspicious bone lesions identified.
IMPRESSION: 1. Small slightly exophytic enhancing lesion arising from the
posterior cortex of the left kidney measuring 8 mm is worrisome for
a renal cell carcinoma.
2. Advanced changes of cirrhosis with stigmata of portal venous
hypertension including splenomegaly and varices.
3. Decrease in size of postop seroma within the right paramidline
omentum.

## 2020-04-02 MED ORDER — GADOBUTROL 1 MMOL/ML IV SOLN
7.0000 mL | Freq: Once | INTRAVENOUS | Status: AC | PRN
  Administered 2020-04-02: 7 mL via INTRAVENOUS

## 2020-04-03 ENCOUNTER — Ambulatory Visit
Admission: RE | Admit: 2020-04-03 | Discharge: 2020-04-03 | Disposition: A | Discharge status: Home / Self Care | Payer: Federal, State, Local not specified - PPO | Source: Ambulatory Visit | Attending: Family Medicine | Admitting: Family Medicine

## 2020-04-03 DIAGNOSIS — Z1231 Encounter for screening mammogram for malignant neoplasm of breast: Secondary | ICD-10-CM | POA: Diagnosis present

## 2020-04-03 IMAGING — MG DIGITAL SCREENING BILAT W/ TOMO W/ CAD
6 of 10 series · 6 of 30 positions shown · non-contrast
Comparison: Previous exam(s).

CLINICAL DATA: Screening.

EXAM:
DIGITAL SCREENING BILATERAL MAMMOGRAM WITH TOMO AND CAD

[R MLO synth-2D]
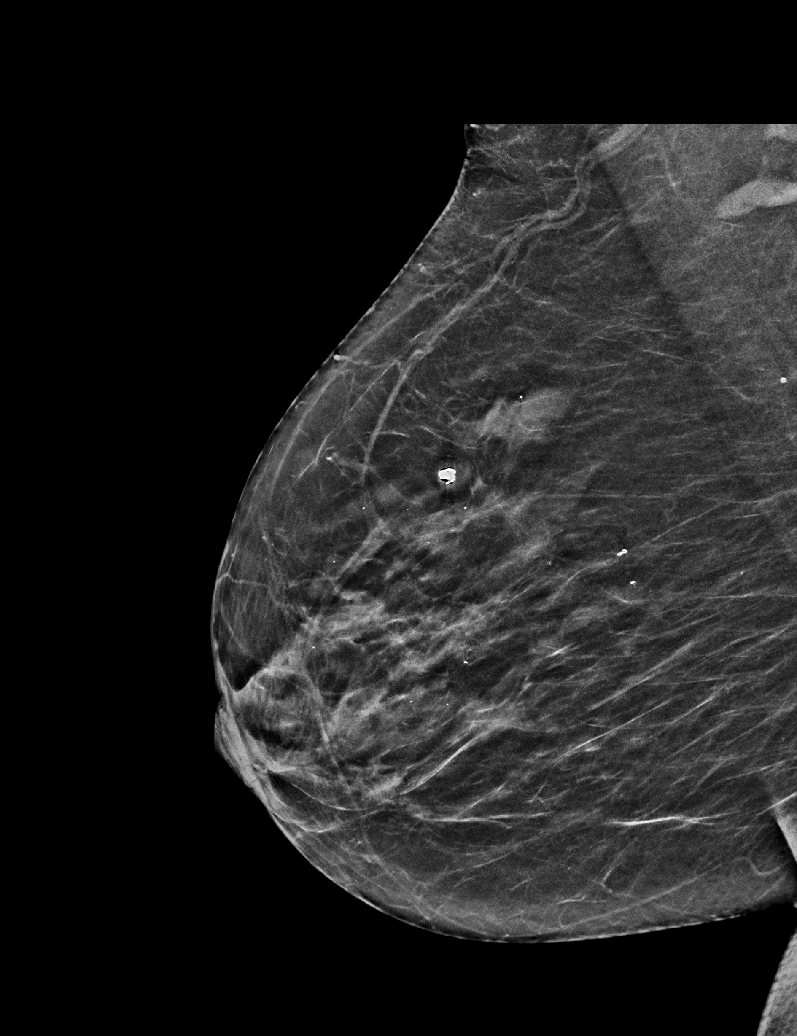

[R CC synth-2D]
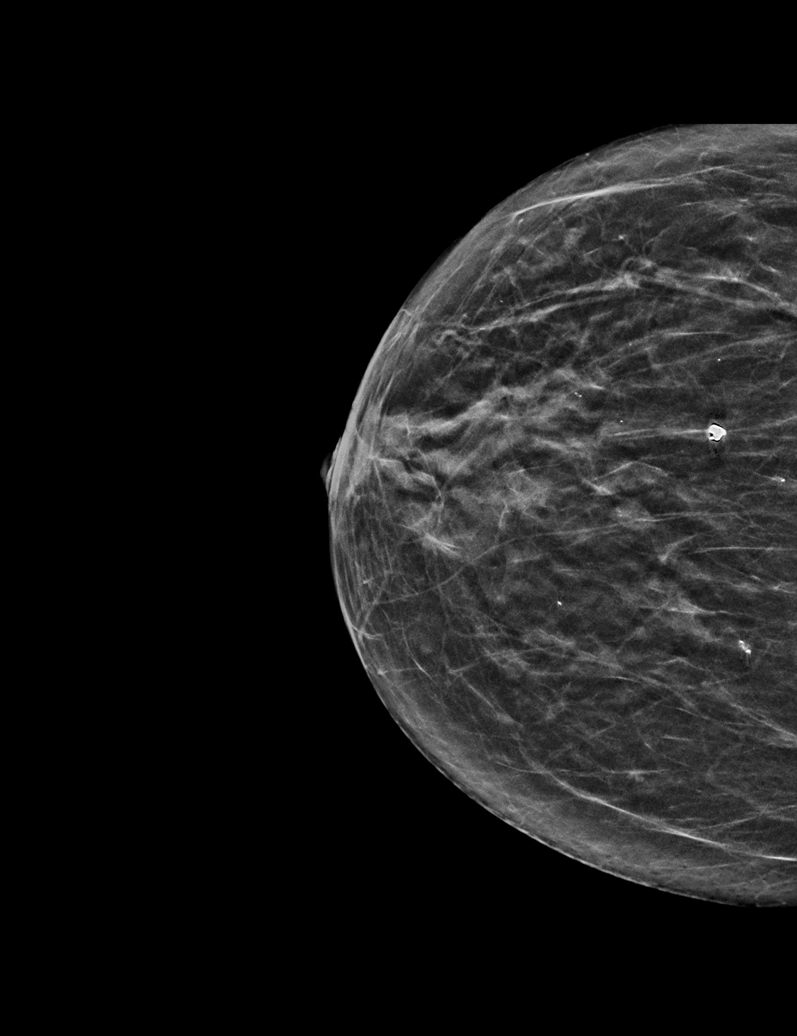

[L MLO synth-2D (1 of 2)]
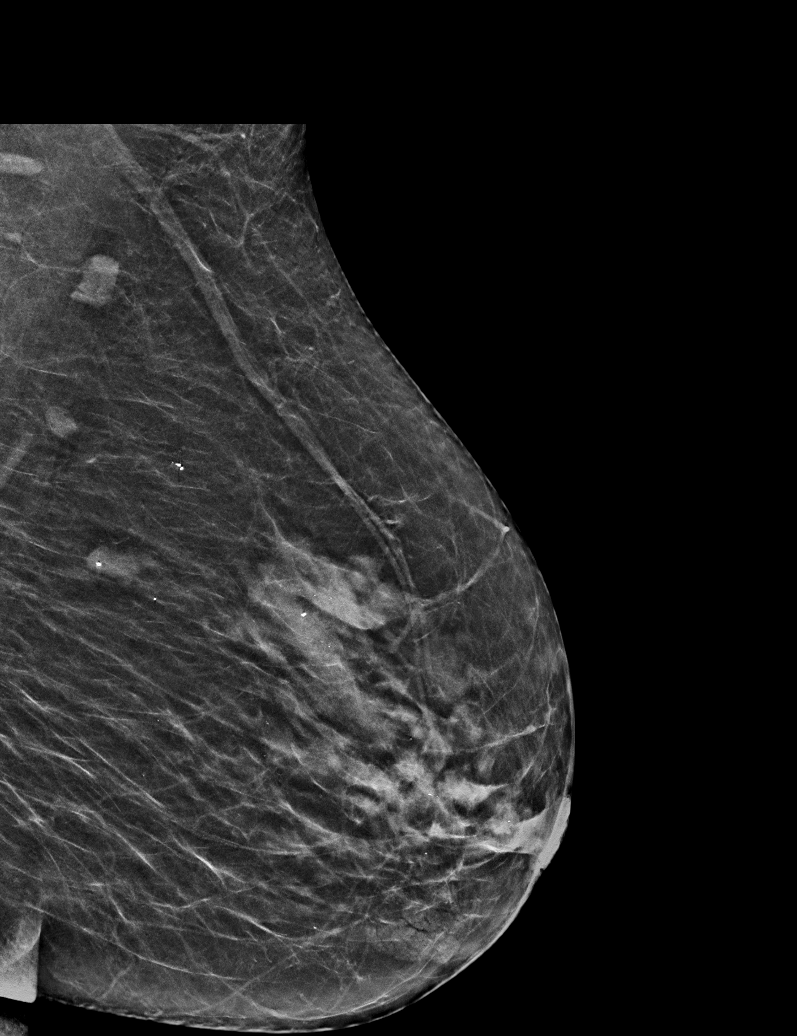

[L CC synth-2D]
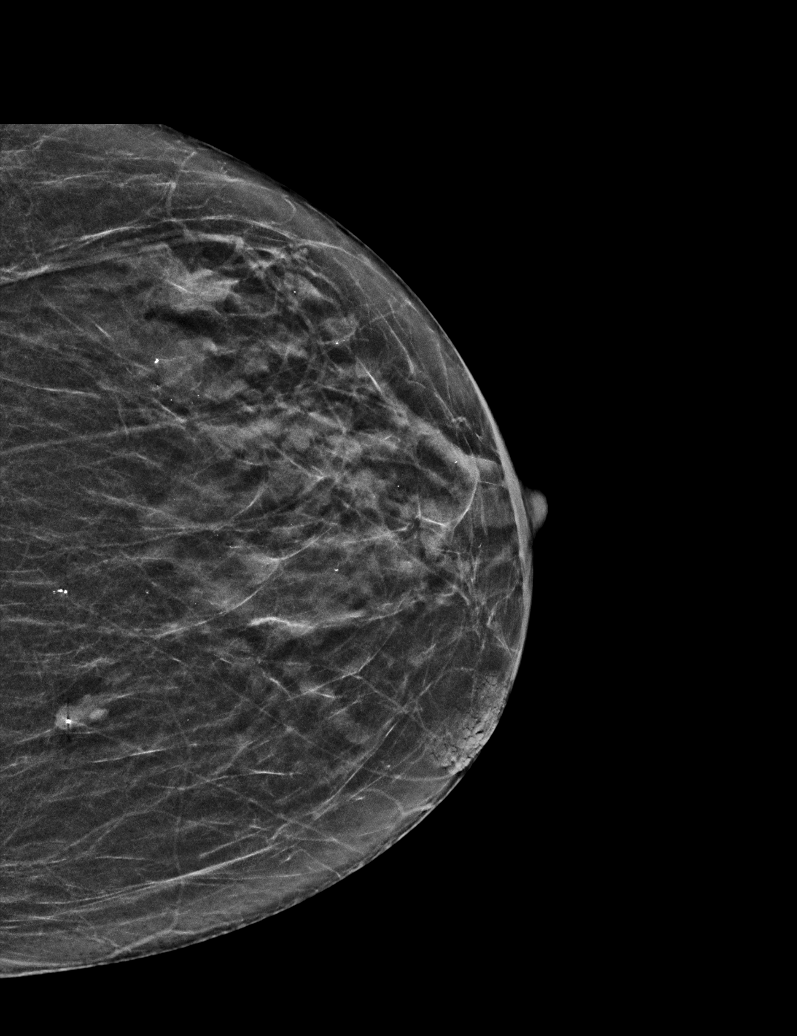

[L MLO synth-2D (2 of 2)]
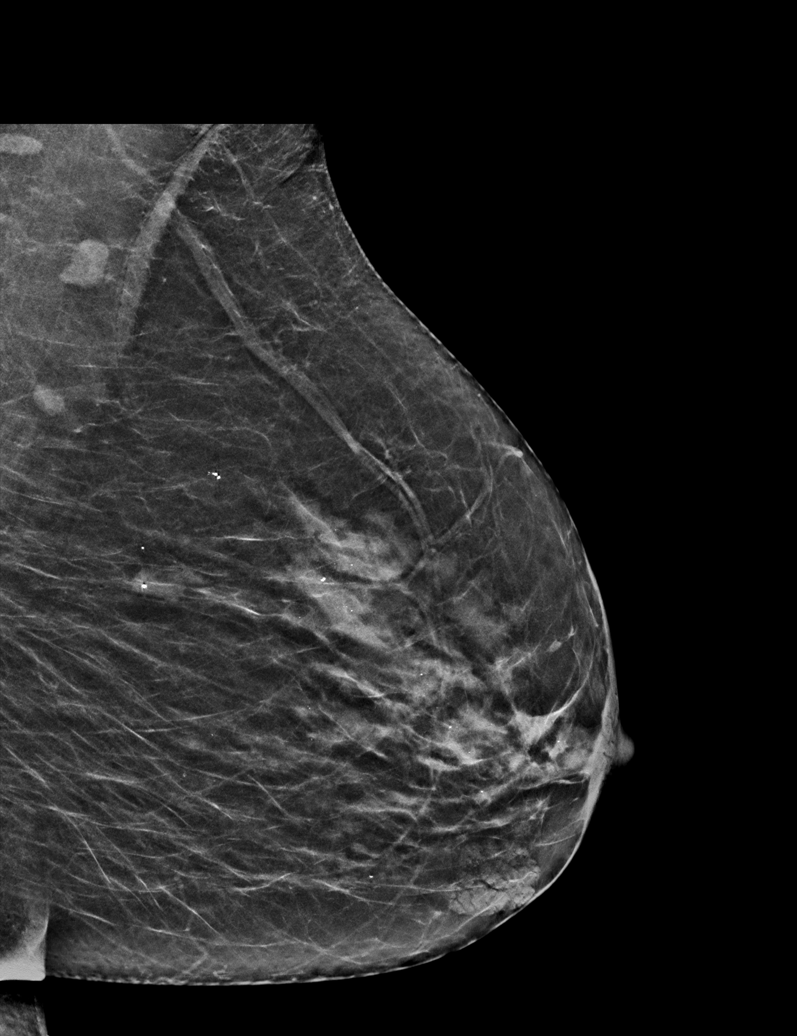

[R MLO tomo · tomo slice 23/45.0]
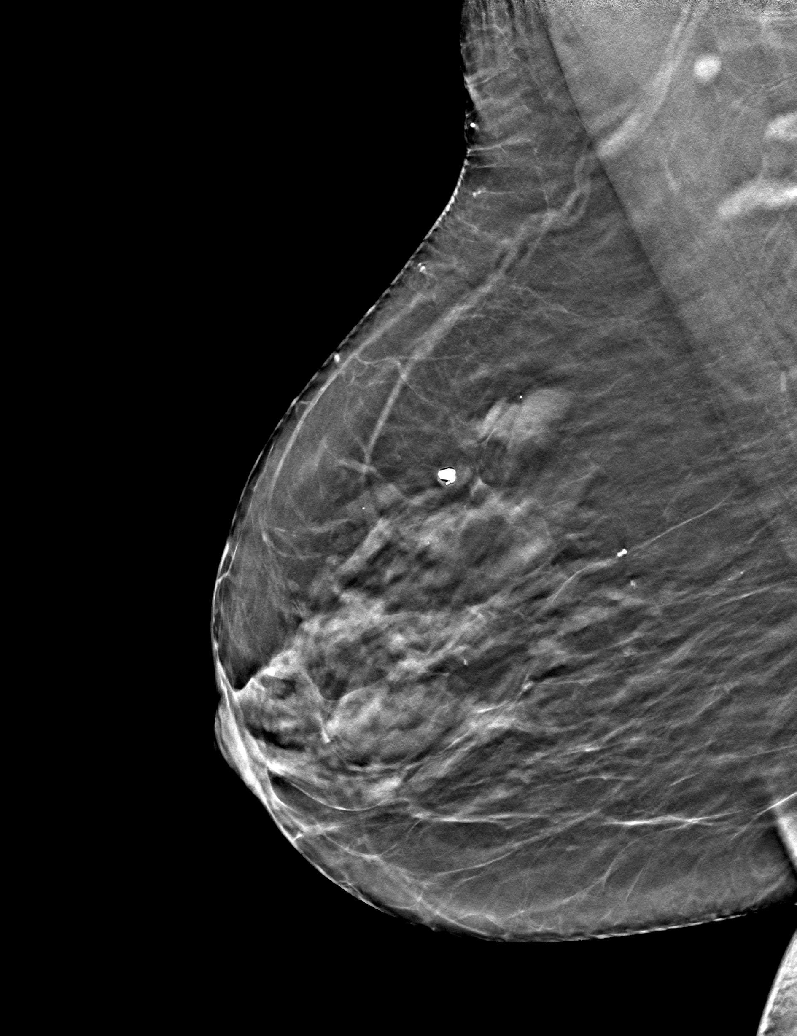

[6 of 30 positions shown; findings below may reference images not displayed]

ACR Breast Density Category c: The breast tissue is heterogeneously
dense, which may obscure small masses.
FINDINGS: There are no findings suspicious for malignancy. Images were
processed with CAD.
IMPRESSION: No mammographic evidence of malignancy. A result letter of this
screening mammogram will be mailed directly to the patient.

RECOMMENDATION:
Screening mammogram in one year. (Code:[5V])

BI-RADS CATEGORY  1: Negative.

## 2020-04-09 NOTE — Progress Notes (Signed)
Patient denied of any problems or concerns.

## 2020-04-10 ENCOUNTER — Inpatient Hospital Stay: Payer: Federal, State, Local not specified - PPO | Attending: Oncology | Admitting: Oncology

## 2020-04-10 ENCOUNTER — Other Ambulatory Visit: Payer: Self-pay

## 2020-04-10 VITALS — BP 137/81 | HR 74 | Temp 97.8°F | Wt 155.5 lb

## 2020-04-10 DIAGNOSIS — K219 Gastro-esophageal reflux disease without esophagitis: Secondary | ICD-10-CM | POA: Diagnosis not present

## 2020-04-10 DIAGNOSIS — Z833 Family history of diabetes mellitus: Secondary | ICD-10-CM | POA: Insufficient documentation

## 2020-04-10 DIAGNOSIS — N2889 Other specified disorders of kidney and ureter: Secondary | ICD-10-CM | POA: Diagnosis not present

## 2020-04-10 DIAGNOSIS — Z7984 Long term (current) use of oral hypoglycemic drugs: Secondary | ICD-10-CM | POA: Insufficient documentation

## 2020-04-10 DIAGNOSIS — D61818 Other pancytopenia: Secondary | ICD-10-CM | POA: Insufficient documentation

## 2020-04-10 DIAGNOSIS — D509 Iron deficiency anemia, unspecified: Secondary | ICD-10-CM | POA: Diagnosis not present

## 2020-04-10 DIAGNOSIS — Z8349 Family history of other endocrine, nutritional and metabolic diseases: Secondary | ICD-10-CM | POA: Insufficient documentation

## 2020-04-10 DIAGNOSIS — Z79899 Other long term (current) drug therapy: Secondary | ICD-10-CM | POA: Diagnosis not present

## 2020-04-10 DIAGNOSIS — F329 Major depressive disorder, single episode, unspecified: Secondary | ICD-10-CM | POA: Diagnosis not present

## 2020-04-10 DIAGNOSIS — E119 Type 2 diabetes mellitus without complications: Secondary | ICD-10-CM | POA: Insufficient documentation

## 2020-04-10 DIAGNOSIS — K7460 Unspecified cirrhosis of liver: Secondary | ICD-10-CM

## 2020-04-10 DIAGNOSIS — I10 Essential (primary) hypertension: Secondary | ICD-10-CM | POA: Insufficient documentation

## 2020-04-10 DIAGNOSIS — E785 Hyperlipidemia, unspecified: Secondary | ICD-10-CM | POA: Diagnosis not present

## 2020-04-11 DIAGNOSIS — N2889 Other specified disorders of kidney and ureter: Secondary | ICD-10-CM | POA: Insufficient documentation

## 2020-04-11 NOTE — Progress Notes (Signed)
[place]  [phone] Fax:(336) [phone]  ID: Roberta Lane OB: 01-28-1956  MR#: [id number]  CSN#:[id number]  Patient Care Team: [place] Pink, MD as PCP - General (Family Medicine) Lloyd Huger, MD as Consulting Physician (Oncology)  CHIEF COMPLAINT: Pancytopenia, iron deficiency anemia.  Left renal mass.  INTERVAL HISTORY: Patient returns to clinic today as an add-on for further evaluation and discussion of an incidental 8 mm mass seen on her left kidney on abdominal MRI.  She is anxious, but otherwise feels well.  She continues to have chronic weakness and fatigue. She has no neurologic complaints.  She denies any recent fevers or illnesses.  She has a good appetite and denies weight loss.  She denies any chest pain, cough, hemoptysis, or shortness of breath.  She denies any abdominal pain. She denies any nausea, vomiting, constipation, or diarrhea.  She has no melena or hematochezia. She has no urinary complaints.  Patient offers no further specific complaints today.  REVIEW OF SYSTEMS:   Review of Systems  Constitutional: Positive for malaise/fatigue. Negative for fever and weight loss.  Respiratory: Negative.  Negative for cough, hemoptysis and shortness of breath.   Cardiovascular: Negative.  Negative for chest pain and leg swelling.  Gastrointestinal: Negative.  Negative for abdominal pain, blood in stool and melena.  Genitourinary: Negative.  Negative for dysuria and hematuria.  Musculoskeletal: Negative.  Negative for back pain.  Skin: Negative.  Negative for rash.  Neurological: Positive for weakness. Negative for sensory change and focal weakness.  Psychiatric/Behavioral: The patient is nervous/anxious.     As per HPI. Otherwise, a complete review of systems is negative.  PAST MEDICAL HISTORY: Past Medical History:  Diagnosis Date  . Allergic genetic state   . Aortic ejection murmur   . Depression   . Diabetes mellitus  without complication ([place])   . Dysrhythmia   . GERD (gastroesophageal reflux disease)   . Headache   . Hepatic cirrhosis ([place])   . Hepatitis C   . Hernia, abdominal   . Hyperlipidemia   . Hypertension   . Irregular heart rhythm   . Liver disease   . Palpitations   . Portal hypertension with esophageal varices ([facility])   . UTI (urinary tract infection)     PAST SURGICAL HISTORY: Past Surgical History:  Procedure Laterality Date  . ABDOMINAL HYSTERECTOMY    . CHOLECYSTECTOMY    . ESOPHAGOGASTRODUODENOSCOPY (EGD) WITH PROPOFOL N/A 11/23/2017   Procedure: ESOPHAGOGASTRODUODENOSCOPY (EGD) WITH PROPOFOL;  Surgeon: Toledo, Benay Pike, MD;  Location: [facility];  Service: Gastroenterology;  Laterality: N/A;  . ESOPHAGOGASTRODUODENOSCOPY (EGD) WITH PROPOFOL N/A 10/23/2018   Procedure: ESOPHAGOGASTRODUODENOSCOPY (EGD) WITH PROPOFOL;  Surgeon: Toledo, Benay Pike, MD;  Location: [facility];  Service: Gastroenterology;  Laterality: N/A;  . ESOPHAGOGASTRODUODENOSCOPY (EGD) WITH PROPOFOL N/A 10/10/2019   Procedure: ESOPHAGOGASTRODUODENOSCOPY (EGD) WITH PROPOFOL;  Surgeon: Toledo, Benay Pike, MD;  Location: [facility];  Service: Gastroenterology;  Laterality: N/A;  . ESOPHAGOGASTRODUODENOSCOPY (EGD) WITH PROPOFOL N/A 11/14/2019   Procedure: ESOPHAGOGASTRODUODENOSCOPY (EGD) WITH PROPOFOL;  Surgeon: Toledo, Benay Pike, MD;  Location: [facility];  Service: Gastroenterology;  Laterality: N/A;  . right elbow surgery    . WISDOM TOOTH EXTRACTION      FAMILY HISTORY: Family History  Problem Relation Age of Onset  . Diabetes Mother   . Heart attack Mother   . Hypertension Father   . Thyroid disease Sister   . Sickle cell anemia Sister   . Breast cancer Neg Hx  ADVANCED DIRECTIVES (Y/N):  N  HEALTH MAINTENANCE: Social History   Tobacco Use  . Smoking status: Never Smoker  . Smokeless tobacco: Never Used  Vaping Use  . Vaping Use: Never used  Substance Use Topics  . Alcohol  use: Not Currently  . Drug use: Never     Colonoscopy:  PAP:  Bone density:  Lipid panel:  Allergies  Allergen Reactions  . Penicillins Anaphylaxis  . Amlodipine Besylate Itching  . Metronidazole Other (See Comments)    Patient states that she felt "foggy" and "loopy" after taking medication.  . Diphenhydramine-Acetaminophen Other (See Comments)    Made my breast hurt.  . Enalapril   . Furosemide   . Sulfa Antibiotics     Reaction unknown.  . Tylenol [Acetaminophen] Other (See Comments)    Breast pain  . Codeine Itching  . Latex Rash    Current Outpatient Medications  Medication Sig Dispense Refill  . clarithromycin (BIAXIN) 500 MG tablet Take 500 mg by mouth 2 (two) times daily.    . clobetasol ointment (TEMOVATE) 0.05 % APP TOPICALLY AA BID  1  . famotidine (PEPCID) 20 MG tablet Take 20 mg by mouth 2 (two) times daily.    Marland Kitchen lactulose (CHRONULAC) 10 GM/15ML solution Take 45 mLs (30 g total) by mouth 2 (two) times daily. 240 mL 0  . metFORMIN (GLUCOPHAGE) 1000 MG tablet Take 1,000 mg by mouth 2 (two) times daily.    . metroNIDAZOLE (FLAGYL) 250 MG tablet     . nadolol (CORGARD) 20 MG tablet Take 20 mg by mouth daily.    Marland Kitchen omeprazole (PRILOSEC) 20 MG capsule     . ondansetron (ZOFRAN) 4 MG tablet     . rifaximin (XIFAXAN) 550 MG TABS tablet Take by mouth.    . traZODone (DESYREL) 50 MG tablet Take 50 mg by mouth at bedtime.    . ursodiol (ACTIGALL) 300 MG capsule Take 300 mg by mouth 2 (two) times daily.     No current facility-administered medications for this visit.    OBJECTIVE: Vitals:   04/09/20 1547  BP: 137/81  Pulse: 74  Temp: 97.8 F (36.6 C)  SpO2: 98%     Body mass index is 26.69 kg/m.    ECOG FS:0 - Asymptomatic  General: Well-developed, well-nourished, no acute distress. Eyes: Pink conjunctiva, anicteric sclera. HEENT: Normocephalic, moist mucous membranes. Lungs: No audible wheezing or coughing. Heart: Regular rate and rhythm. Abdomen: Soft,  nontender, no obvious distention. Musculoskeletal: No edema, cyanosis, or clubbing. Neuro: Alert, answering all questions appropriately. Cranial nerves grossly intact. Skin: No rashes or petechiae noted. Psych: Normal affect.  LAB RESULTS:  Lab Results  Component Value Date   NA 138 07/21/2018   K 4.1 07/21/2018   CL 112 (H) 07/21/2018   CO2 18 (L) 07/21/2018   GLUCOSE 111 (H) 07/21/2018   BUN 8 07/21/2018   CREATININE 0.66 07/21/2018   CALCIUM 8.6 (L) 07/21/2018   PROT 7.9 07/20/2018   ALBUMIN 3.6 07/20/2018   AST 48 (H) 07/20/2018   ALT 27 07/20/2018   ALKPHOS 91 07/20/2018   BILITOT 2.5 (H) 07/20/2018   GFRNONAA >60 07/21/2018   GFRAA >60 07/21/2018    Lab Results  Component Value Date   WBC 3.9 (L) 03/19/2020   NEUTROABS 2.6 03/19/2020   HGB 11.3 (L) 03/19/2020   HCT 32.8 (L) 03/19/2020   MCV 97.9 03/19/2020   PLT 79 (L) 03/19/2020   Lab Results  Component Value Date  IRON 110 03/19/2020   TIBC 364 03/19/2020   IRONPCTSAT 30 03/19/2020   Lab Results  Component Value Date   FERRITIN 129 03/19/2020     STUDIES: CT ABDOMEN PELVIS W WO CONTRAST  Result Date: 03/16/2020 CLINICAL DATA:  Right renal lesion on ultrasound. Hernia surgery 4 weeks ago. Hepatitis C and cirrhosis. EXAM: CT ABDOMEN AND PELVIS WITHOUT AND WITH CONTRAST TECHNIQUE: Multidetector CT imaging of the abdomen and pelvis was performed following the standard protocol before and following the bolus administration of intravenous contrast. CONTRAST:  161m OMNIPAQUE IOHEXOL 300 MG/ML  SOLN COMPARISON:  Abdominal ultrasound 10/04/2019 and CT abdomen pelvis 11/21/2017. FINDINGS: Lower chest: Lung bases are clear. Heart is at the upper limits of normal in size to mildly enlarged. No pericardial or pleural effusion. Gastroesophageal varices are present. Hepatobiliary: Liver parenchyma is heterogeneous and margin is markedly irregular. No definitive areas of abnormal arterial phase enhancement. Multiple  low-attenuation lesions in the liver measure up to 1.8 cm in the inferior right hepatic lobe, as before, suggesting cysts. Mild common bile duct dilatation is likely related to cholecystectomy. Pancreas: Negative. Spleen: Measures 16.8 cm. Adrenals/Urinary Tract: Adrenal glands are unremarkable. No urinary stones. An 8 mm lesion off the posterior interpolar left kidney appears slightly larger than on 11/21/2017, previously measuring approximately 5 mm. Lesion appears to enhance, measuring 8 Hounsfield units on precontrast imaging and 61 Hounsfield units on postcontrast imaging with slight washout on nephrographic phase imaging, measuring 38 Hounsfield units. Prominent extrarenal pelves bilaterally. Fluid density lesion in the lower pole left kidney measures 1.7 cm, may have dependent milk of calcium and is stable, indicative of a cyst. Ureters are decompressed. Bladder is grossly unremarkable. Stomach/Bowel: Stomach, small bowel and appendix are unremarkable. Stool is seen throughout the colon, indicative of constipation. Vascular/Lymphatic: Recanalized paraumbilical vein with extensive varices. Mildly prominent portal vein, 1.7 cm. Small to borderline enlarged lymph nodes in the upper abdomen, measuring up to 11 mm in the splenic hilum (11/36). Reproductive: Hysterectomy.  No adnexal mass. Other: Right paramidline omental mass is seen at the level of the umbilicus, measuring 2.7 x 3.2 cm. It is hyperdense and without enhancement but is new from 11/21/2017. Umbilical hernia repair. Mild mesenteric haziness, similar. Trace pelvic ascites. Musculoskeletal: Degenerative changes in the spine. No worrisome lytic or sclerotic lesions. IMPRESSION: 1. 8 mm lesion off the posterior interpolar left kidney, slightly larger than on 11/21/2017. Lesion appears to enhance and is worrisome for renal cell carcinoma. Technically, lesion is small for definitive characterization on CT. Consider MR abdomen without and with contrast in  further evaluation, as clinically indicated. 2. Cirrhosis with portal hypertension (extensive varices, splenomegaly and trace ascites). [place] could be performed on MR recommended above. 3. New hyperdense nonenhancing omental lesion at the level of the umbilicus likely represents a mildly complex postoperative seroma related to umbilical hernia repair. 4. Small to borderline enlarged lymph nodes in the upper abdomen, likely reactive. Electronically Signed   By: MLorin PicketM.D.   On: 03/16/2020 08:57   MR ABDOMEN WWO CONTRAST  Result Date: 04/03/2020 CLINICAL DATA:  Evaluate kidney lesion.  Hepatitis C and cirrhosis. EXAM: MRI ABDOMEN WITHOUT AND WITH CONTRAST TECHNIQUE: Multiplanar multisequence MR imaging of the abdomen was performed both before and after the administration of intravenous contrast. CONTRAST:  736mGADAVIST GADOBUTROL 1 MMOL/ML IV SOLN COMPARISON:  CT from 03/16/2020 FINDINGS: Lower chest: No acute findings. Hepatobiliary: Advanced changes of cirrhosis identified. Multiple liver cysts are identified. No definite areas of  abnormal arterial phase enhancement. Previous cholecystectomy. No biliary ductal dilatation. Common bile duct measures up to 5 mm. Pancreas: No mass, inflammatory changes, or other parenchymal abnormality identified. Spleen: The spleen is enlarged measuring 15.4 cm in length. No focal splenic abnormality. Adrenals/Urinary Tract: Normal appearance of the adrenal glands. Small slightly exophytic enhancing lesion is identified arising from the posterior cortex of the left kidney measuring 8 mm. This is worrisome for a renal cell carcinoma. Simple appearing cyst within the inferior pole of left kidney measures 1.4 x 1.1 cm, image 33/9. No hydronephrosis identified bilaterally. Stomach/Bowel: Visualized portions within the abdomen are unremarkable. Vascular/Lymphatic: Recanalized periumbilical vein with extensive varices. The portal vein appears prominent. No adenopathy  identified. Other: Postoperative changes involving the ventral abdominal wall is again noted. Previously characterized postop seroma within the right paramidline omentum measures 2.4 by 1.6 cm, image 33/4. Decreased from 3.1 by 2.5 cm previously. Musculoskeletal: No suspicious bone lesions identified. IMPRESSION: 1. Small slightly exophytic enhancing lesion arising from the posterior cortex of the left kidney measuring 8 mm is worrisome for a renal cell carcinoma. 2. Advanced changes of cirrhosis with stigmata of portal venous hypertension including splenomegaly and varices. 3. Decrease in size of postop seroma within the right paramidline omentum. Electronically Signed   By: Kerby Moors [name]   On: 04/03/2020 14:30   MM 3D SCREEN BREAST BILATERAL  Result Date: 04/06/2020 CLINICAL DATA:  Screening. EXAM: DIGITAL SCREENING BILATERAL MAMMOGRAM WITH TOMO AND CAD COMPARISON:  Previous exam(s). ACR Breast Density Category c: The breast tissue is heterogeneously dense, which may obscure small masses. FINDINGS: There are no findings suspicious for malignancy. Images were processed with CAD. IMPRESSION: No mammographic evidence of malignancy. A result letter of this screening mammogram will be mailed directly to the patient. RECOMMENDATION: Screening mammogram in one year. (Code:SM-B-01Y) BI-RADS CATEGORY  1: Negative. Electronically Signed   By: Lovey Newcomer [name]   On: 04/06/2020 09:46    ASSESSMENT: Pancytopenia, iron deficiency anemia.  Left renal mass.  PLAN:    1.  Pancytopenia: Multifactorial including cirrhosis and splenomegaly.  MRI on April 02, 2020 revealed a slightly decrease in size of patient's spleen at 15.4 cm.  Patient's white blood cell count and platelet count remain decreased, but have trended up slightly in the last several months.  Hemoglobin has trended down, but iron stores continue to be within normal limits.  She was previously noted to have antineutrophil antibodies as well as platelet  antibodies but both are likely clinically insignificant given the stability of her laboratory work.  No intervention is needed.  Patient does not require bone marrow biopsy. 2.  Cirrhosis: AFP continues to be within normal limits at 2.6.  Continue follow-up with GI as indicated. 3.  GI bleed: Patient denies any recent bleeding.  Patient's most recent EGD was in January 2021. 4.  Iron deficiency anemia: Hemoglobin has trended down slightly, but iron stores remain within normal limits.  Patient last received treatment on July 29, 2019.   5.  Left renal mass: MRI results from April 02, 2020 reviewed independently and report as above with an 8 mm mass in the left kidney suspicious for underlying renal cell carcinoma.  Lesion was too small to accurately identify or treat, therefore will continue simple observation and repeat MRI in 3 months.  Patient will follow-up 1 to 2 days later with laboratory work for further evaluation.   Patient expressed understanding and was in agreement with this plan. She also understands that  She can call clinic at any time with any questions, concerns, or complaints.    Lloyd Huger, MD   04/11/2020 10:22 AM

## 2020-05-26 ENCOUNTER — Encounter: Payer: Self-pay | Admitting: Dermatology

## 2020-05-26 ENCOUNTER — Other Ambulatory Visit: Payer: Self-pay

## 2020-05-26 ENCOUNTER — Ambulatory Visit (INDEPENDENT_AMBULATORY_CARE_PROVIDER_SITE_OTHER): Payer: Federal, State, Local not specified - PPO | Admitting: Dermatology

## 2020-05-26 DIAGNOSIS — L820 Inflamed seborrheic keratosis: Secondary | ICD-10-CM | POA: Diagnosis not present

## 2020-05-26 DIAGNOSIS — L821 Other seborrheic keratosis: Secondary | ICD-10-CM

## 2020-05-26 NOTE — Patient Instructions (Addendum)

## 2020-05-26 NOTE — Progress Notes (Signed)
   Follow-Up Visit   Subjective  Roberta Lane is a 64 y.o. female who presents for the following: area of concern.  Patient presents today for a few areas of concern that she would like to have evaluated today on her R cheek and Left breast. Patient states that they are mole like and scaly. The one on her face has grown in size and is darker, the one on the breast is irritating.  The following portions of the chart were reviewed this encounter and updated as appropriate:      Review of Systems:  No other skin or systemic complaints except as noted in HPI or Assessment and Plan.  Objective  Well appearing patient in no apparent distress; mood and affect are within normal limits.  A focused examination was performed including Face, R. Leg and Left breast. Relevant physical exam findings are noted in the Assessment and Plan.  Objective  Left Medial  Breast, Right lowerCheek: Erythematous keratotic or waxy stuck-on papule or plaque.    Assessment & Plan  Inflamed seborrheic keratosis (2) Left Medial  Breast; Right lowerCheek  Cryotherapy today   Destruction of lesion - Left Medial  Breast, Right lowerCheek  Destruction method: cryotherapy   Destruction method comment:  Electrodessication Informed consent: discussed and consent obtained   Lesion destroyed using liquid nitrogen: Yes   Region frozen until ice ball extended beyond lesion: Yes   Outcome: patient tolerated procedure well with no complications   Post-procedure details: wound care instructions given   Additional details:  Prior to procedure, discussed risks of blister formation, small wound, skin dyspigmentation, or rare scar following cryotherapy.   Seborrheic Keratoses - Stuck-on, waxy, tan-brown papules and plaques face, trunk - Discussed benign etiology and prognosis. - Observe - Call for any changes  Return in about 2 months (around 07/26/2020) for ISK.  I, Donzetta Kohut, CMA, am acting as scribe for  Brendolyn Patty, MD .  Documentation: I have reviewed the above documentation for accuracy and completeness, and I agree with the above.  Brendolyn Patty MD

## 2020-05-27 NOTE — Addendum Note (Signed)
Addended by: Brendolyn Patty on: 05/27/2020 09:08 AM   Modules accepted: Level of Service

## 2020-06-22 ENCOUNTER — Other Ambulatory Visit: Payer: Federal, State, Local not specified - PPO

## 2020-06-23 ENCOUNTER — Ambulatory Visit: Payer: Federal, State, Local not specified - PPO

## 2020-06-23 ENCOUNTER — Ambulatory Visit: Payer: Federal, State, Local not specified - PPO | Admitting: Oncology

## 2020-07-12 NOTE — Progress Notes (Signed)
[facility]  [phone]) [phone] [phone]) [phone]  ID: Roberta Lane OB: [birth year]  MR#: [id number]  TJQ#:[id number]  Patient Care Team: [place] Pink, MD as PCP - General (Family Medicine) Lloyd Huger, MD as Consulting Physician (Oncology)  CHIEF COMPLAINT: Pancytopenia, iron deficiency anemia.  Left renal mass.  INTERVAL HISTORY: Patient returns to clinic for repeat laboratory work, further evaluation, and discussion of her imaging results.  She currently feels well and is at her baseline.  She continues to have chronic weakness and fatigue. She has no neurologic complaints.  She denies any recent fevers or illnesses.  She has a good appetite and denies weight loss.  She denies any chest pain, cough, hemoptysis, or shortness of breath.  She denies any abdominal pain. She denies any nausea, vomiting, constipation, or diarrhea.  She has no melena or hematochezia. She has no urinary complaints.  Patient offers no further specific complaints today.  REVIEW OF SYSTEMS:   Review of Systems  Constitutional: Positive for malaise/fatigue. Negative for fever and weight loss.  Respiratory: Negative.  Negative for cough, hemoptysis and shortness of breath.   Cardiovascular: Negative.  Negative for chest pain and leg swelling.  Gastrointestinal: Negative.  Negative for abdominal pain, blood in stool and melena.  Genitourinary: Negative.  Negative for dysuria and hematuria.  Musculoskeletal: Negative.  Negative for back pain.  Skin: Negative.  Negative for rash.  Neurological: Positive for weakness. Negative for sensory change and focal weakness.  Psychiatric/Behavioral: Negative.  The patient is not nervous/anxious.     As per HPI. Otherwise, a complete review of systems is negative.  PAST MEDICAL HISTORY: Past Medical History:  Diagnosis Date   Allergic genetic state    Aortic ejection murmur    Depression    Diabetes mellitus without complication  (HCC)    Dysrhythmia    GERD (gastroesophageal reflux disease)    Headache    Hepatic cirrhosis (HCC)    Hepatitis C    Hernia, abdominal    Hyperlipidemia    Hypertension    Irregular heart rhythm    Liver disease    Palpitations    Portal hypertension with esophageal varices (HCC)    UTI (urinary tract infection)     PAST SURGICAL HISTORY: Past Surgical History:  Procedure Laterality Date   ABDOMINAL HYSTERECTOMY     CHOLECYSTECTOMY     ESOPHAGOGASTRODUODENOSCOPY (EGD) WITH PROPOFOL N/A 11/23/2017   Procedure: ESOPHAGOGASTRODUODENOSCOPY (EGD) WITH PROPOFOL;  Surgeon: Toledo, Benay Pike, MD;  Location: [facility];  Service: Gastroenterology;  Laterality: N/A;   ESOPHAGOGASTRODUODENOSCOPY (EGD) WITH PROPOFOL N/A 10/23/2018   Procedure: ESOPHAGOGASTRODUODENOSCOPY (EGD) WITH PROPOFOL;  Surgeon: Toledo, Benay Pike, MD;  Location: [facility];  Service: Gastroenterology;  Laterality: N/A;   ESOPHAGOGASTRODUODENOSCOPY (EGD) WITH PROPOFOL N/A 10/10/2019   Procedure: ESOPHAGOGASTRODUODENOSCOPY (EGD) WITH PROPOFOL;  Surgeon: Toledo, Benay Pike, MD;  Location: [facility];  Service: Gastroenterology;  Laterality: N/A;   ESOPHAGOGASTRODUODENOSCOPY (EGD) WITH PROPOFOL N/A 11/14/2019   Procedure: ESOPHAGOGASTRODUODENOSCOPY (EGD) WITH PROPOFOL;  Surgeon: Toledo, Benay Pike, MD;  Location: [facility];  Service: Gastroenterology;  Laterality: N/A;   right elbow surgery     WISDOM TOOTH EXTRACTION      FAMILY HISTORY: Family History  Problem Relation Age of Onset   Diabetes Mother    Heart attack Mother    Hypertension Father    Thyroid disease Sister    Sickle cell anemia Sister    Breast cancer Neg Hx     ADVANCED DIRECTIVES (Y/N):  N  HEALTH MAINTENANCE: Social History   Tobacco Use   Smoking status: Never Smoker   Smokeless tobacco: Never Used  Scientific laboratory technician Use: Never used  Substance Use Topics   Alcohol use: Not Currently    Drug use: Never     Colonoscopy:  PAP:  Bone density:  Lipid panel:  Allergies  Allergen Reactions   Penicillins Anaphylaxis   Amlodipine Besylate Itching   Metronidazole Other (See Comments)    Patient states that she felt "foggy" and "loopy" after taking medication.   Diphenhydramine-Acetaminophen Other (See Comments)    Made my breast hurt.   Enalapril    Furosemide    Sulfa Antibiotics     Reaction unknown.   Tylenol [Acetaminophen] Other (See Comments)    Breast pain   Codeine Itching   Latex Rash    Current Outpatient Medications  Medication Sig Dispense Refill   clarithromycin (BIAXIN) 500 MG tablet Take 500 mg by mouth 2 (two) times daily.     clobetasol ointment (TEMOVATE) 0.05 % APP TOPICALLY AA BID  1   famotidine (PEPCID) 20 MG tablet Take 20 mg by mouth 2 (two) times daily.     lactulose (CHRONULAC) 10 GM/15ML solution Take 45 mLs (30 g total) by mouth 2 (two) times daily. 240 mL 0   metFORMIN (GLUCOPHAGE) 1000 MG tablet Take 1,000 mg by mouth 2 (two) times daily.     metroNIDAZOLE (FLAGYL) 250 MG tablet      nadolol (CORGARD) 20 MG tablet Take 20 mg by mouth daily.     omeprazole (PRILOSEC) 20 MG capsule      ondansetron (ZOFRAN) 4 MG tablet      rifaximin (XIFAXAN) 550 MG TABS tablet Take by mouth.     spironolactone (ALDACTONE) 100 MG tablet Take 100 mg by mouth daily.     traZODone (DESYREL) 50 MG tablet Take 50 mg by mouth at bedtime.     ursodiol (ACTIGALL) 300 MG capsule Take 300 mg by mouth 2 (two) times daily.     No current facility-administered medications for this visit.    OBJECTIVE: Vitals:   07/16/20 1405  BP: 112/69  Pulse: (!) 58  Resp: 20  Temp: 98.4 F (36.9 C)  SpO2: 99%     Body mass index is 28.7 kg/m.    ECOG FS:0 - Asymptomatic  General: Well-developed, well-nourished, no acute distress. Eyes: Pink conjunctiva, anicteric sclera. HEENT: Normocephalic, moist mucous membranes. Lungs: No audible  wheezing or coughing. Heart: Regular rate and rhythm. Abdomen: Soft, nontender, no obvious distention. Musculoskeletal: No edema, cyanosis, or clubbing. Neuro: Alert, answering all questions appropriately. Cranial nerves grossly intact. Skin: No rashes or petechiae noted. Psych: Normal affect.  LAB RESULTS:  Lab Results  Component Value Date   NA 138 07/21/2018   K 4.1 07/21/2018   CL 112 (H) 07/21/2018   CO2 18 (L) 07/21/2018   GLUCOSE 111 (H) 07/21/2018   BUN 8 07/21/2018   CREATININE 0.66 07/21/2018   CALCIUM 8.6 (L) 07/21/2018   PROT 7.9 07/20/2018   ALBUMIN 3.6 07/20/2018   AST 48 (H) 07/20/2018   ALT 27 07/20/2018   ALKPHOS 91 07/20/2018   BILITOT 2.5 (H) 07/20/2018   GFRNONAA >60 07/21/2018   GFRAA >60 07/21/2018    Lab Results  Component Value Date   WBC 5.7 07/16/2020   NEUTROABS 3.3 07/16/2020   HGB 11.4 (L) 07/16/2020   HCT 32.3 (L) 07/16/2020   MCV 92.0 07/16/2020   PLT 113 (  L) 07/16/2020   Lab Results  Component Value Date   IRON 92 07/16/2020   TIBC 392 07/16/2020   IRONPCTSAT 24 07/16/2020   Lab Results  Component Value Date   FERRITIN 85 07/16/2020     STUDIES: MR Abdomen W Wo Contrast  Result Date: 07/15/2020 CLINICAL DATA:  64 year old female with history of suspicious left renal lesion. Follow-up study. Additional history of cirrhosis and hepatitis-C. EXAM: MRI ABDOMEN WITHOUT AND WITH CONTRAST TECHNIQUE: Multiplanar multisequence MR imaging of the abdomen was performed both before and after the administration of intravenous contrast. CONTRAST:  74m GADAVIST GADOBUTROL 1 MMOL/ML IV SOLN COMPARISON:  Abdominal MRI 04/02/2020. FINDINGS: Lower chest: Large paraesophageal varices adjacent to the distal esophagus. Hepatobiliary: Liver has a shrunken appearance and very nodular contour, indicative of advanced cirrhosis. Throughout the liver there is a lace-like pattern of T2 hyperintensity, which corresponds to T1 hypointensity and similar pattern of  progressive increased delayed contrast enhancement, compatible with areas of confluent fibrosis, most severe throughout the right lobe of the liver. No discrete hypervascular hepatic lesions are confidently identified to suggest underlying hepatocellular carcinoma. There are multiple small T1 hypointense, T2 hyperintense, nonenhancing lesions scattered throughout the liver, largest of which is in the inferior aspect of segment 5 (axial image 19 of series 4) measuring up to 1.9 cm in diameter, compatible with small cysts and/or biliary hamartomas. No intra or extrahepatic biliary ductal dilatation. Gallbladder is not visualized, presumably surgically absent. Pancreas: No pancreatic mass. No pancreatic ductal dilatation. Small amount of T2 hyperintensity in the retroperitoneum adjacent to the pancreas. Spleen: Spleen is enlarged measuring 11.6 x 6.9 x 16.2 cm (estimated splenic volume of 648 mL) . Adrenals/Urinary Tract: In the posterior aspect of the lower pole the left kidney there is an exophytic lesion which is T1 isointense, T2 hypointense, and demonstrates enhancement on post gadolinium imaging, measuring 9 mm on today's study, highly concerning for a small renal neoplasm. Right kidney and bilateral adrenal glands are normal in appearance. No hydroureteronephrosis in the visualized portions of the abdomen. Stomach/Bowel: Visualized portions are unremarkable. Vascular/Lymphatic: No aneurysm identified in the visualized abdominal vasculature. Portal vein is dilated measuring 19 mm in the porta hepatis. Numerous portosystemic collateral pathways are noted, including recanalized paraumbilical vein, large gastric and large paraesophageal varices. No lymphadenopathy noted in the abdomen or pelvis. Other: There is some T2 hyperintensity in the root of the small bowel mesentery. No other significant volume of ascites in the visualized portions of the peritoneal cavity. Musculoskeletal: No aggressive appearing osseous  lesions are noted in the visualized portions of the skeleton. IMPRESSION: 1. Previously noted lesion in the lower pole the left kidney remains suspicious for a small renal neoplasm. This is encapsulated within Gerota's fascia, is well separated from the left renal vein which is patent, and is not associated with lymphadenopathy or definite signs of metastatic disease at this time. 2. Advanced hepatic cirrhosis with extensive hepatic fibrosis, and evidence of portal venous hypertension, including splenomegaly and numerous portosystemic collateral pathways most notable for large gastric and paraesophageal varices. No definite hypervascular lesion in the liver noted at this time to suggest the presence of hepatocellular carcinoma. 3. Small amount of T2 hyperintensity adjacent to the pancreas, extending into the root of the small bowel mesentery. Clinical correlation for signs and symptoms of pancreatitis is suggested. Electronically Signed   By: DVinnie LangtonM.D.   On: 07/15/2020 10:52    ASSESSMENT: Pancytopenia, iron deficiency anemia.  Left renal mass.  PLAN:  1.  Pancytopenia: Multifactorial including cirrhosis and splenomegaly.  MRI on April 02, 2020 revealed a slightly decrease in size of patient's spleen at 15.4 cm.  Patient's white blood cell count is now within normal limits.  Platelets remain decreased, but have improved to 113.  Hemoglobin remains decreased, but chronic and unchanged with normal iron stores.  She was previously noted to have antineutrophil antibodies as well as platelet antibodies but both are likely clinically insignificant given the stability of her laboratory work.  No intervention is needed.  Patient does not require bone marrow biopsy. 2.  Cirrhosis: Patient's most recent AFP continues to be within normal limits at 2.6.  Continue follow-up with GI as indicated. 3.  GI bleed: Patient denies any recent bleeding.  Patient's most recent EGD was in January 2021. 4.  Iron  deficiency anemia: Chronic and unchanged.  Patient's hemoglobin is 11.4 with normal iron stores.  She last received treatment on July 29, 2019.   5.  Left renal mass: MRI results from April 02, 2020 reviewed independently and report as above with an 8 mm mass in the left kidney suspicious for underlying renal cell carcinoma.  Repeat MRI on July 15, 2020 reviewed independently and report as above with only mild progression of lesion to 9 mm.  Lesion was too small to accurately identify or treat, therefore will continue simple observation.  Repeat imaging and laboratory work in 6 months with follow-up 1 to 2 days later.   Patient expressed understanding and was in agreement with this plan. She also understands that She can call clinic at any time with any questions, concerns, or complaints.    Lloyd Huger, MD   07/17/2020 11:47 AM

## 2020-07-15 ENCOUNTER — Ambulatory Visit
Admission: RE | Admit: 2020-07-15 | Discharge: 2020-07-15 | Disposition: A | Discharge status: Home / Self Care | Payer: Federal, State, Local not specified - PPO | Source: Ambulatory Visit | Attending: Oncology | Admitting: Oncology

## 2020-07-15 ENCOUNTER — Other Ambulatory Visit: Payer: Self-pay

## 2020-07-15 ENCOUNTER — Inpatient Hospital Stay: Payer: Federal, State, Local not specified - PPO

## 2020-07-15 DIAGNOSIS — K7460 Unspecified cirrhosis of liver: Secondary | ICD-10-CM | POA: Diagnosis not present

## 2020-07-15 IMAGING — MR MR ABDOMEN WO/W CM
16 of 18 series · 44 of 48 positions shown · IV contrast (gadavist)
Comparison: Abdominal MRI [DATE].

CLINICAL DATA: 63-year-old female with history of suspicious left
renal lesion. Follow-up study. Additional history of cirrhosis and
hepatitis-C.

EXAM:
MRI ABDOMEN WITHOUT AND WITH CONTRAST
TECHNIQUE: Multiplanar multisequence MR imaging of the abdomen was performed
both before and after the administration of intravenous contrast.
CONTRAST:  7mL GADAVIST GADOBUTROL 1 MMOL/ML IV SOLN

[Series 3: T2 · coronal · 6.0mm · 1.19mm/px · 2 of 35 slices shown (1 of 2)]
[im 1/35]
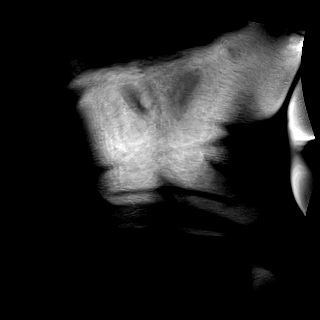
[im 35/35]
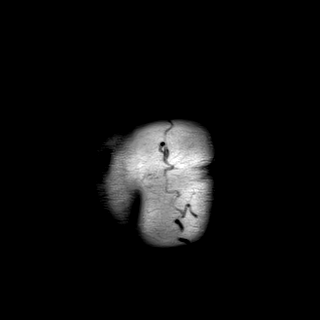

[Series 4: T2 · axial · 6.0mm · 1.19mm/px · z∈[-165,+80]mm · 2 of 35 slices shown (2 of 2)]
[im 1/35]
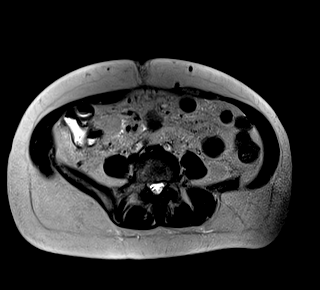
[im 35/35]
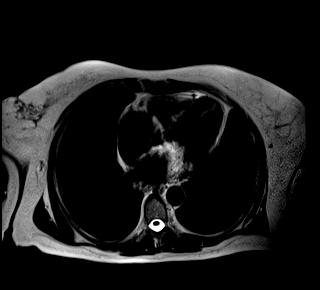

[Series 6: T2 fat-sat · axial · 6.0mm · 1.19mm/px · z∈[-172,+87]mm · 2 of 37 slices shown]
[im 1/37]
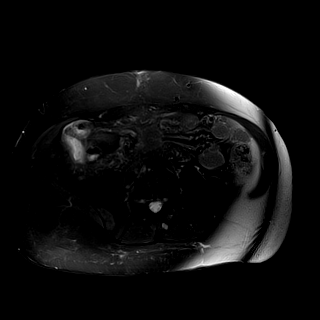
[im 37/37]
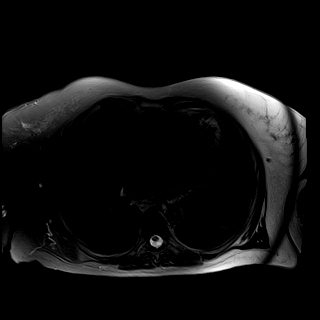

[Series 7: ax dwi_tracew · axial · 6.0mm · 1.42mm/px · z∈[-176,+90]mm · 5 of 114 slices shown]
[im 1/114]
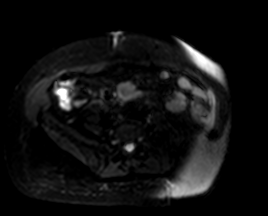
[im 29/114]
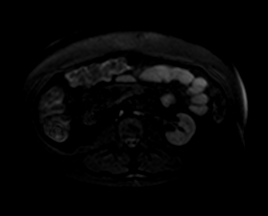
[im 57/114]
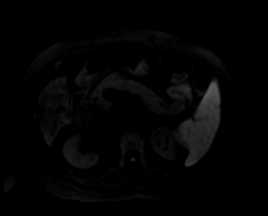
[im 85/114]
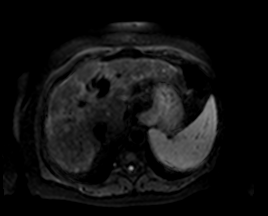
[im 114/114]
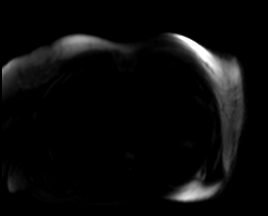

[Series 8: ax dwi_adc · axial · 6.0mm · 1.42mm/px · z∈[-176,+90]mm · 2 of 38 slices shown]
[im 1/38]
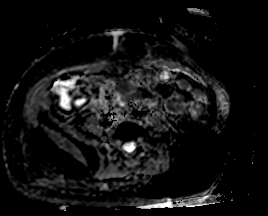
[im 38/38]
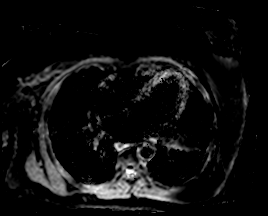

[Series 9: T1 · axial · 6.0mm · 0.74mm/px · z∈[-165,+80]mm · 3 of 70 slices shown]
[im 1/70]
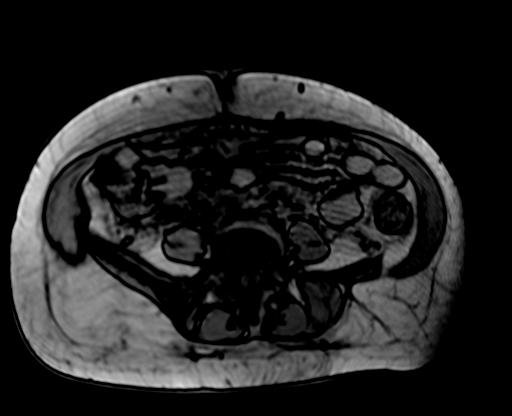
[im 35/70]
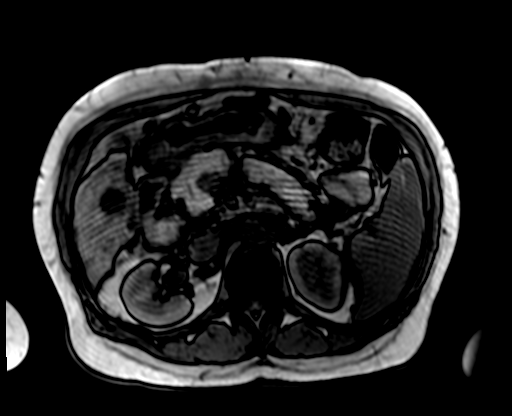
[im 70/70]
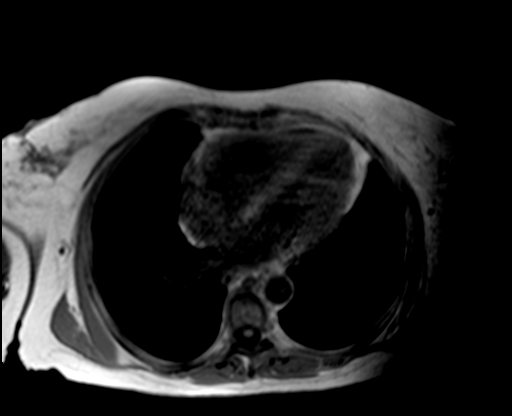

[Series 10: bSSFP · axial · 6.0mm · 0.74mm/px · 1 of 35 slices shown]
[im 1/35]
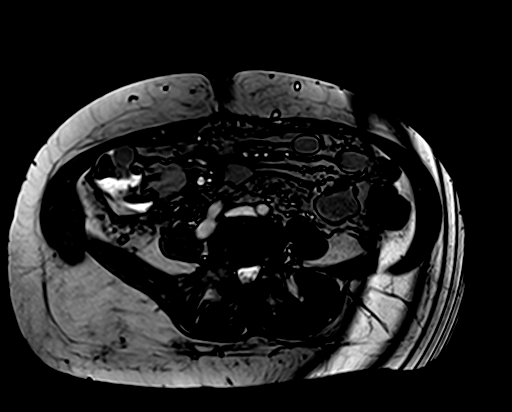

[Series 11: T1 dynamic fat-sat · axial · non-contrast · 3.0mm · 1.19mm/px · z∈[-161,+76]mm · 3 of 80 slices shown (1 of 4)]
[im 1/80]
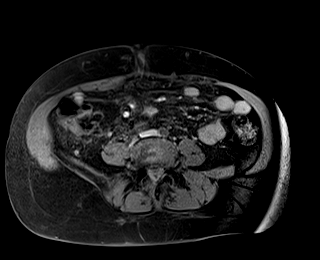
[im 40/80]
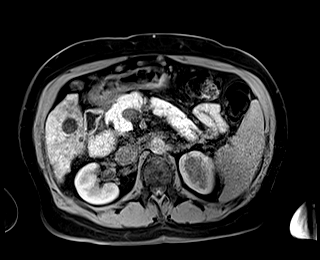
[im 80/80]
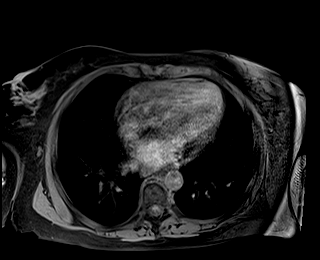

[Series 12: T1 dynamic fat-sat post-contrast · axial · 3.0mm · 1.19mm/px · z∈[-161,+76]mm · 3 of 80 slices shown (1 of 4)]
[im 1/80]
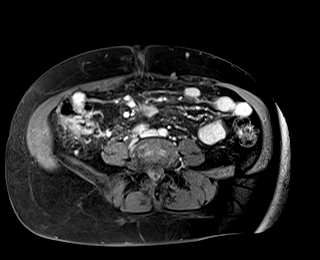
[im 40/80]
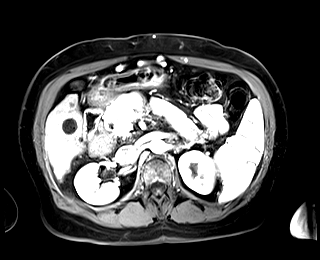
[im 80/80]
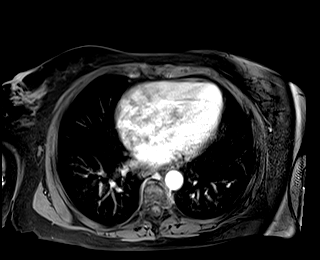

[Series 13: T1 dynamic fat-sat · axial · 3.0mm · 1.19mm/px · z∈[-161,+76]mm · 3 of 80 slices shown (2 of 4)]
[im 1/80]
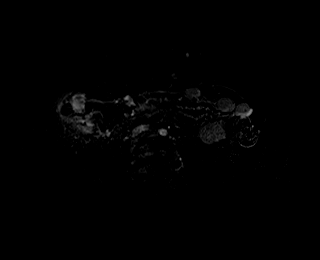
[im 40/80]
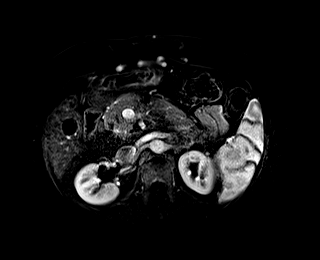
[im 80/80]
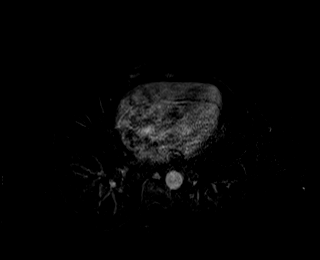

[Series 14: T1 dynamic fat-sat post-contrast · axial · 3.0mm · 1.19mm/px · z∈[-161,+76]mm · 3 of 80 slices shown (2 of 4)]
[im 1/80]
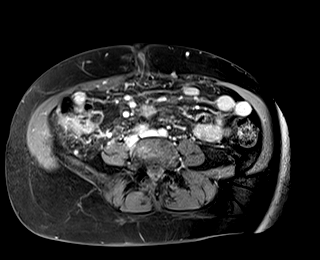
[im 40/80]
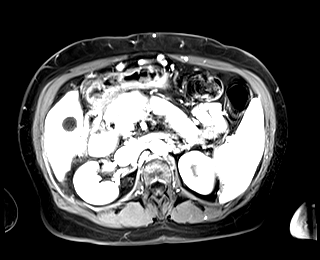
[im 80/80]
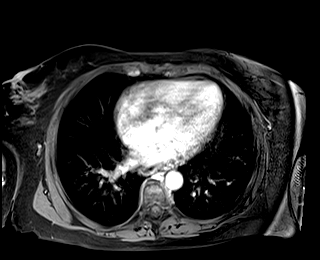

[Series 15: T1 dynamic fat-sat · axial · 3.0mm · 1.19mm/px · z∈[-161,+76]mm · 3 of 80 slices shown (3 of 4)]
[im 1/80]
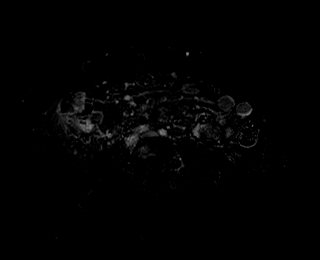
[im 40/80]
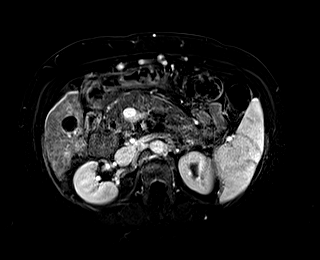
[im 80/80]
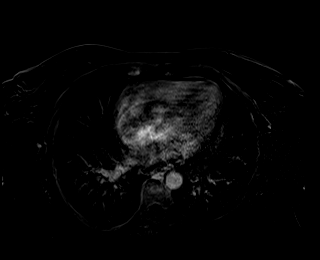

[Series 16: T1 dynamic fat-sat post-contrast · axial · 3.0mm · 1.19mm/px · z∈[-161,+76]mm · 3 of 80 slices shown (3 of 4)]
[im 1/80]
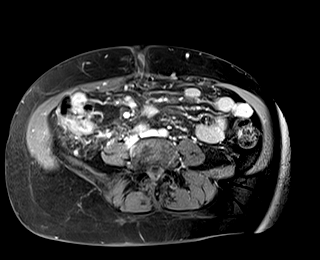
[im 40/80]
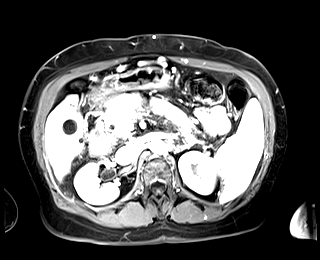
[im 80/80]
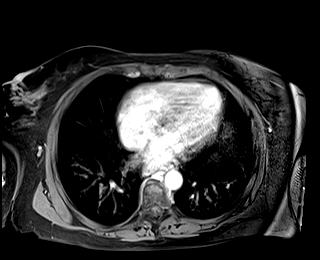

[Series 17: T1 dynamic fat-sat · axial · 3.0mm · 1.19mm/px · z∈[-161,+76]mm · 3 of 80 slices shown (4 of 4)]
[im 1/80]
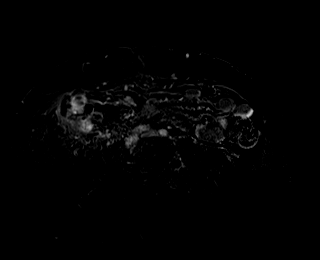
[im 40/80]
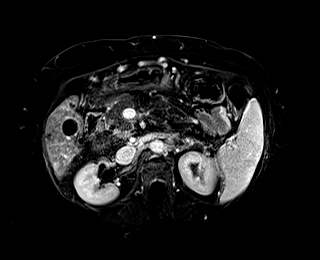
[im 80/80]
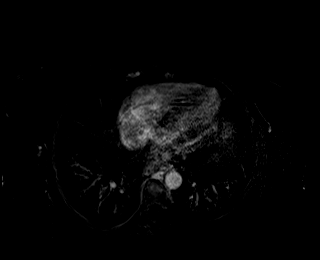

[Series 18: T1 dynamic post-contrast · coronal · 3.0mm · 1.31mm/px · 3 of 72 slices shown]
[im 1/72]
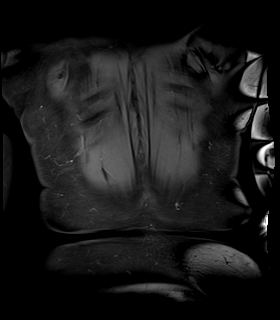
[im 36/72]
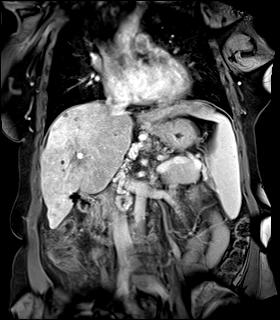
[im 72/72]
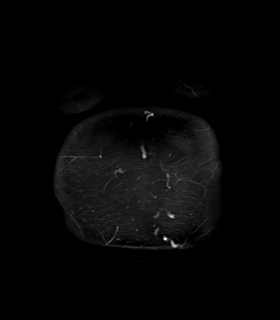

[Series 19: T1 dynamic fat-sat post-contrast · axial · 3.0mm · 1.19mm/px · z∈[-161,+76]mm · 3 of 80 slices shown (4 of 4)]
[im 1/80]
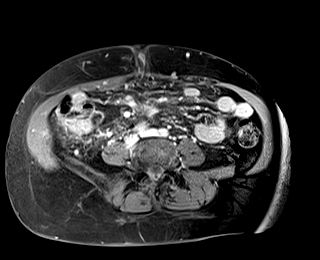
[im 40/80]
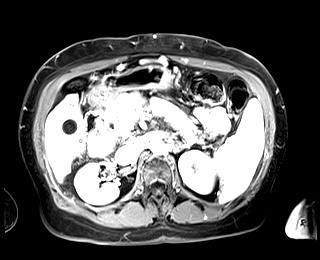
[im 80/80]
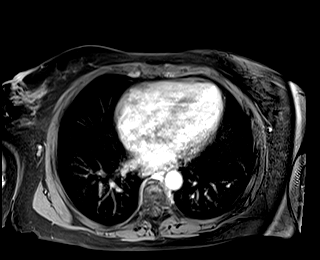

[44 of 48 positions shown; findings below may reference images not displayed]

FINDINGS: Lower chest: Large paraesophageal varices adjacent to the distal
esophagus.

Hepatobiliary: Liver has a shrunken appearance and very nodular
contour, indicative of advanced cirrhosis. Throughout the liver
there is a lace-like pattern of T2 hyperintensity, which corresponds
to T1 hypointensity and similar pattern of progressive increased
delayed contrast enhancement, compatible with areas of confluent
fibrosis, most severe throughout the right lobe of the liver. No
discrete hypervascular hepatic lesions are confidently identified to
suggest underlying hepatocellular carcinoma. There are multiple
small T1 hypointense, T2 hyperintense, nonenhancing lesions
scattered throughout the liver, largest of which is in the inferior
aspect of segment 5 (axial image 19 of series 4) measuring up to
cm in diameter, compatible with small cysts and/or biliary
hamartomas. No intra or extrahepatic biliary ductal dilatation.
Gallbladder is not visualized, presumably surgically absent.

Pancreas: No pancreatic mass. No pancreatic ductal dilatation. Small
amount of T2 hyperintensity in the retroperitoneum adjacent to the
pancreas.

Spleen: Spleen is enlarged measuring 11.6 x 6.9 x 16.2 cm (estimated
splenic volume of 648 mL) .

Adrenals/Urinary Tract: In the posterior aspect of the lower pole
the left kidney there is an exophytic lesion which is T1 isointense,
T2 hypointense, and demonstrates enhancement on post gadolinium
imaging, measuring 9 mm on today's study, highly concerning for a
small renal neoplasm. Right kidney and bilateral adrenal glands are
normal in appearance. No hydroureteronephrosis in the visualized
portions of the abdomen.

Stomach/Bowel: Visualized portions are unremarkable.

Vascular/Lymphatic: No aneurysm identified in the visualized
abdominal vasculature. Portal vein is dilated measuring 19 mm in the
porta hepatis. Numerous portosystemic collateral pathways are noted,
including recanalized paraumbilical vein, large gastric and large
paraesophageal varices. No lymphadenopathy noted in the abdomen or
pelvis.

Other: There is some T2 hyperintensity in the root of the small
bowel mesentery. No other significant volume of ascites in the
visualized portions of the peritoneal cavity.

Musculoskeletal: No aggressive appearing osseous lesions are noted
in the visualized portions of the skeleton.
IMPRESSION: 1. Previously noted lesion in the lower pole the left kidney remains
suspicious for a small renal neoplasm. This is encapsulated within
Gerota's fascia, is well separated from the left renal vein which is
patent, and is not associated with lymphadenopathy or definite signs
of metastatic disease at this time.
2. Advanced hepatic cirrhosis with extensive hepatic fibrosis, and
evidence of portal venous hypertension, including splenomegaly and
numerous portosystemic collateral pathways most notable for large
gastric and paraesophageal varices. No definite hypervascular lesion
in the liver noted at this time to suggest the presence of
hepatocellular carcinoma.
3. Small amount of T2 hyperintensity adjacent to the pancreas,
extending into the root of the small bowel mesentery. Clinical
correlation for signs and symptoms of pancreatitis is suggested.

## 2020-07-15 MED ORDER — GADOBUTROL 1 MMOL/ML IV SOLN
7.0000 mL | Freq: Once | INTRAVENOUS | Status: AC | PRN
  Administered 2020-07-15: 7 mL via INTRAVENOUS

## 2020-07-16 ENCOUNTER — Inpatient Hospital Stay: Payer: Federal, State, Local not specified - PPO

## 2020-07-16 ENCOUNTER — Inpatient Hospital Stay: Payer: Federal, State, Local not specified - PPO | Attending: Oncology | Admitting: Oncology

## 2020-07-16 ENCOUNTER — Encounter: Payer: Self-pay | Admitting: Oncology

## 2020-07-16 VITALS — BP 112/69 | HR 58 | Temp 98.4°F | Resp 20 | Ht 64.0 in | Wt 167.2 lb

## 2020-07-16 DIAGNOSIS — Z79899 Other long term (current) drug therapy: Secondary | ICD-10-CM | POA: Diagnosis not present

## 2020-07-16 DIAGNOSIS — K766 Portal hypertension: Secondary | ICD-10-CM | POA: Diagnosis not present

## 2020-07-16 DIAGNOSIS — N2889 Other specified disorders of kidney and ureter: Secondary | ICD-10-CM | POA: Insufficient documentation

## 2020-07-16 DIAGNOSIS — R5382 Chronic fatigue, unspecified: Secondary | ICD-10-CM | POA: Diagnosis not present

## 2020-07-16 DIAGNOSIS — D61818 Other pancytopenia: Secondary | ICD-10-CM | POA: Diagnosis not present

## 2020-07-16 DIAGNOSIS — I8510 Secondary esophageal varices without bleeding: Secondary | ICD-10-CM | POA: Diagnosis not present

## 2020-07-16 DIAGNOSIS — Z7984 Long term (current) use of oral hypoglycemic drugs: Secondary | ICD-10-CM | POA: Diagnosis not present

## 2020-07-16 DIAGNOSIS — E119 Type 2 diabetes mellitus without complications: Secondary | ICD-10-CM | POA: Insufficient documentation

## 2020-07-16 DIAGNOSIS — K7460 Unspecified cirrhosis of liver: Secondary | ICD-10-CM | POA: Insufficient documentation

## 2020-07-16 DIAGNOSIS — E785 Hyperlipidemia, unspecified: Secondary | ICD-10-CM | POA: Insufficient documentation

## 2020-07-16 DIAGNOSIS — D509 Iron deficiency anemia, unspecified: Secondary | ICD-10-CM | POA: Diagnosis not present

## 2020-07-16 DIAGNOSIS — R531 Weakness: Secondary | ICD-10-CM | POA: Diagnosis not present

## 2020-07-16 DIAGNOSIS — K219 Gastro-esophageal reflux disease without esophagitis: Secondary | ICD-10-CM | POA: Diagnosis not present

## 2020-07-16 DIAGNOSIS — F329 Major depressive disorder, single episode, unspecified: Secondary | ICD-10-CM | POA: Insufficient documentation

## 2020-07-16 DIAGNOSIS — I10 Essential (primary) hypertension: Secondary | ICD-10-CM | POA: Insufficient documentation

## 2020-07-16 DIAGNOSIS — R5381 Other malaise: Secondary | ICD-10-CM | POA: Insufficient documentation

## 2020-07-16 LAB — CBC WITH DIFFERENTIAL/PLATELET
Abs Immature Granulocytes: 0.03 10*3/uL (ref 0.00–0.07)
Basophils Absolute: 0.1 10*3/uL (ref 0.0–0.1)
Basophils Relative: 1 %
Eosinophils Absolute: 0.2 10*3/uL (ref 0.0–0.5)
Eosinophils Relative: 4 %
HCT: 32.3 % — ABNORMAL LOW (ref 36.0–46.0)
Hemoglobin: 11.4 g/dL — ABNORMAL LOW (ref 12.0–15.0)
Immature Granulocytes: 1 %
Lymphocytes Relative: 23 %
Lymphs Abs: 1.3 10*3/uL (ref 0.7–4.0)
MCH: 32.5 pg (ref 26.0–34.0)
MCHC: 35.3 g/dL (ref 30.0–36.0)
MCV: 92 fL (ref 80.0–100.0)
Monocytes Absolute: 0.7 10*3/uL (ref 0.1–1.0)
Monocytes Relative: 13 %
Neutro Abs: 3.3 10*3/uL (ref 1.7–7.7)
Neutrophils Relative %: 58 %
Platelets: 113 10*3/uL — ABNORMAL LOW (ref 150–400)
RBC: 3.51 MIL/uL — ABNORMAL LOW (ref 3.87–5.11)
RDW: 14.1 % (ref 11.5–15.5)
WBC: 5.7 10*3/uL (ref 4.0–10.5)
nRBC: 0 % (ref 0.0–0.2)

## 2020-07-16 LAB — IRON AND TIBC
Iron: 92 ug/dL (ref 28–170)
Saturation Ratios: 24 % (ref 10.4–31.8)
TIBC: 392 ug/dL (ref 250–450)
UIBC: 300 ug/dL

## 2020-07-16 LAB — FERRITIN: Ferritin: 85 ng/mL (ref 11–307)

## 2020-07-16 NOTE — Progress Notes (Signed)
Patient denies any pain or concerns. Just states she is feeling very tired and sluggish. Denies any questions at this time.

## 2020-08-11 ENCOUNTER — Other Ambulatory Visit: Payer: Self-pay

## 2020-08-11 ENCOUNTER — Ambulatory Visit (INDEPENDENT_AMBULATORY_CARE_PROVIDER_SITE_OTHER): Payer: Medicare Other | Admitting: Dermatology

## 2020-08-11 DIAGNOSIS — L821 Other seborrheic keratosis: Secondary | ICD-10-CM | POA: Diagnosis not present

## 2020-08-11 DIAGNOSIS — L820 Inflamed seborrheic keratosis: Secondary | ICD-10-CM | POA: Diagnosis not present

## 2020-08-11 NOTE — Patient Instructions (Addendum)
Seborrheic Keratosis  What causes seborrheic keratoses? Seborrheic keratoses are harmless, common skin growths that first appear during adult life.  As time goes by, more growths appear.  Some people may develop a large number of them.  Seborrheic keratoses appear on both covered and uncovered body parts.  They are not caused by sunlight.  The tendency to develop seborrheic keratoses can be inherited.  They vary in color from skin-colored to gray, brown, or even black.  They can be either smooth or have a rough, warty surface.   Seborrheic keratoses are superficial and look as if they were stuck on the skin.  Under the microscope this type of keratosis looks like layers upon layers of skin.  That is why at times the top layer may seem to fall off, but the rest of the growth remains and re-grows.    Treatment Seborrheic keratoses do not need to be treated, but can easily be removed in the office.  Seborrheic keratoses often cause symptoms when they rub on clothing or jewelry.  Lesions can be in the way of shaving.  If they become inflamed, they can cause itching, soreness, or burning.  Removal of a seborrheic keratosis can be accomplished by freezing, burning, or surgery. If any spot bleeds, scabs, or grows rapidly, please return to have it checked, as these can be an indication of a skin cancer.  Cryotherapy Aftercare  . Wash gently with soap and water everyday.   . Apply Vaseline and Band-Aid daily until healed.  Prior to procedure, discussed risks of blister formation, small wound, skin dyspigmentation, or rare scar following cryotherapy.   

## 2020-08-11 NOTE — Progress Notes (Signed)
   Follow-Up Visit   Subjective  Roberta Lane is a 64 y.o. female who presents for the following: Follow-up (Patient here today for 2 month ISK follow up at left medial breast and right lower cheek. PAtient advises they have cleared up. ).  Patient advises she does have some different spots that are irritated under the breast.   The following portions of the chart were reviewed this encounter and updated as appropriate:      Review of Systems:  No other skin or systemic complaints except as noted in HPI or Assessment and Plan.  Objective  Well appearing patient in no apparent distress; mood and affect are within normal limits.  A focused examination was performed including face, trunk. Relevant physical exam findings are noted in the Assessment and Plan.  Objective  Left Inf Medial Breast (residual) x 1, left upper abdomen x 1, left lat breast x 1 (3): Erythematous keratotic or waxy stuck-on papule R lower cheek clear   Assessment & Plan  Inflamed seborrheic keratosis (3) Left Inf Medial Breast (residual) x 1, left upper abdomen x 1, left lat breast x 1  Prior to procedure, discussed risks of blister formation, small wound, skin dyspigmentation, or rare scar following cryotherapy.    Destruction of lesion - Left Inf Medial Breast (residual) x 1, left upper abdomen x 1, left lat breast x 1  Destruction method: cryotherapy   Informed consent: discussed and consent obtained   Lesion destroyed using liquid nitrogen: Yes   Region frozen until ice ball extended beyond lesion: Yes   Outcome: patient tolerated procedure well with no complications   Post-procedure details: wound care instructions given    Seborrheic Keratoses - Stuck-on, waxy, tan-brown papules and plaques at face and breasts - Discussed benign etiology and prognosis.  - Observe - Call for any changes  Return in about 3 months (around 11/11/2020) for recheck ISK's.   Graciella Belton, RMA, am acting as  scribe for Brendolyn Patty, MD . Documentation: I have reviewed the above documentation for accuracy and completeness, and I agree with the above.  Brendolyn Patty MD

## 2020-11-10 ENCOUNTER — Ambulatory Visit: Payer: Federal, State, Local not specified - PPO | Admitting: Dermatology

## 2021-01-07 ENCOUNTER — Other Ambulatory Visit: Payer: Self-pay | Admitting: Oncology

## 2021-01-08 ENCOUNTER — Other Ambulatory Visit: Payer: Self-pay

## 2021-01-08 ENCOUNTER — Ambulatory Visit
Admission: RE | Admit: 2021-01-08 | Discharge: 2021-01-08 | Disposition: A | Discharge status: Home / Self Care | Payer: Medicare Other | Source: Ambulatory Visit | Attending: Oncology | Admitting: Oncology

## 2021-01-08 ENCOUNTER — Inpatient Hospital Stay: Payer: Medicare Other | Attending: Oncology

## 2021-01-08 DIAGNOSIS — Z79899 Other long term (current) drug therapy: Secondary | ICD-10-CM | POA: Diagnosis not present

## 2021-01-08 DIAGNOSIS — Z90710 Acquired absence of both cervix and uterus: Secondary | ICD-10-CM | POA: Insufficient documentation

## 2021-01-08 DIAGNOSIS — Z8349 Family history of other endocrine, nutritional and metabolic diseases: Secondary | ICD-10-CM | POA: Insufficient documentation

## 2021-01-08 DIAGNOSIS — Z833 Family history of diabetes mellitus: Secondary | ICD-10-CM | POA: Insufficient documentation

## 2021-01-08 DIAGNOSIS — D61818 Other pancytopenia: Secondary | ICD-10-CM | POA: Diagnosis not present

## 2021-01-08 DIAGNOSIS — D509 Iron deficiency anemia, unspecified: Secondary | ICD-10-CM | POA: Insufficient documentation

## 2021-01-08 DIAGNOSIS — Z7984 Long term (current) use of oral hypoglycemic drugs: Secondary | ICD-10-CM | POA: Diagnosis not present

## 2021-01-08 DIAGNOSIS — N2889 Other specified disorders of kidney and ureter: Secondary | ICD-10-CM

## 2021-01-08 DIAGNOSIS — Z8249 Family history of ischemic heart disease and other diseases of the circulatory system: Secondary | ICD-10-CM | POA: Diagnosis not present

## 2021-01-08 DIAGNOSIS — I10 Essential (primary) hypertension: Secondary | ICD-10-CM | POA: Insufficient documentation

## 2021-01-08 DIAGNOSIS — E119 Type 2 diabetes mellitus without complications: Secondary | ICD-10-CM | POA: Diagnosis not present

## 2021-01-08 LAB — IRON AND TIBC
Iron: 92 ug/dL (ref 28–170)
Saturation Ratios: 23 % (ref 10.4–31.8)
TIBC: 398 ug/dL (ref 250–450)
UIBC: 306 ug/dL

## 2021-01-08 LAB — CBC WITH DIFFERENTIAL/PLATELET
Abs Immature Granulocytes: 0.02 10*3/uL (ref 0.00–0.07)
Basophils Absolute: 0 10*3/uL (ref 0.0–0.1)
Basophils Relative: 1 %
Eosinophils Absolute: 0.1 10*3/uL (ref 0.0–0.5)
Eosinophils Relative: 3 %
HCT: 34 % — ABNORMAL LOW (ref 36.0–46.0)
Hemoglobin: 11.5 g/dL — ABNORMAL LOW (ref 12.0–15.0)
Immature Granulocytes: 1 %
Lymphocytes Relative: 20 %
Lymphs Abs: 0.7 10*3/uL (ref 0.7–4.0)
MCH: 32.2 pg (ref 26.0–34.0)
MCHC: 33.8 g/dL (ref 30.0–36.0)
MCV: 95.2 fL (ref 80.0–100.0)
Monocytes Absolute: 0.4 10*3/uL (ref 0.1–1.0)
Monocytes Relative: 13 %
Neutro Abs: 2.2 10*3/uL (ref 1.7–7.7)
Neutrophils Relative %: 62 %
Platelets: 79 10*3/uL — ABNORMAL LOW (ref 150–400)
RBC: 3.57 MIL/uL — ABNORMAL LOW (ref 3.87–5.11)
RDW: 14.1 % (ref 11.5–15.5)
WBC: 3.5 10*3/uL — ABNORMAL LOW (ref 4.0–10.5)
nRBC: 0 % (ref 0.0–0.2)

## 2021-01-08 LAB — FERRITIN: Ferritin: 97 ng/mL (ref 11–307)

## 2021-01-08 IMAGING — MR MR ABDOMEN WO/W CM
18 series · 48 of 48 positions shown · IV contrast (7.5ml Gadavist)
Comparison: Prior MRI examinations of the abdomen from [DATE]
and [DATE]

CLINICAL DATA: Follow-up left renal lesion.

EXAM:
MRI ABDOMEN WITHOUT AND WITH CONTRAST
TECHNIQUE: Multiplanar multisequence MR imaging of the abdomen was performed
both before and after the administration of intravenous contrast.
CONTRAST:  7.5mL GADAVIST GADOBUTROL 1 MMOL/ML IV SOLN

[Series 3: T2 · coronal · 6.0mm · 1.19mm/px · 1 of 30 slices shown (1 of 2)]
[im 1/30]
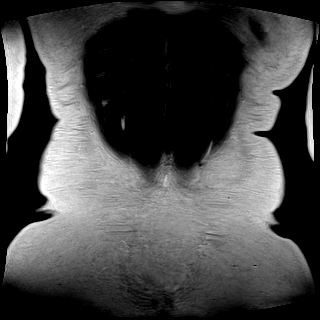

[Series 4: T2 · axial · 6.0mm · 1.19mm/px · 1 of 34 slices shown (2 of 2)]
[im 1/34]
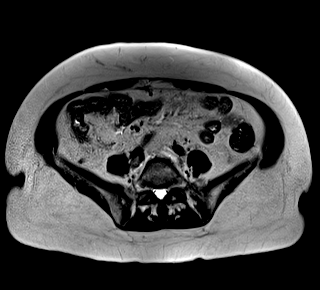

[Series 5: T1 · axial · 6.0mm · 0.74mm/px · 1 of 34 slices shown (1 of 2)]
[im 1/34]
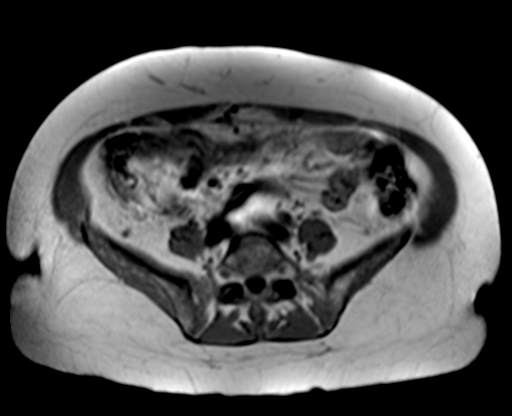

[Series 5: T1 · axial · 6.0mm · 0.74mm/px · 1 of 34 slices shown (2 of 2)]
[im 1/34]
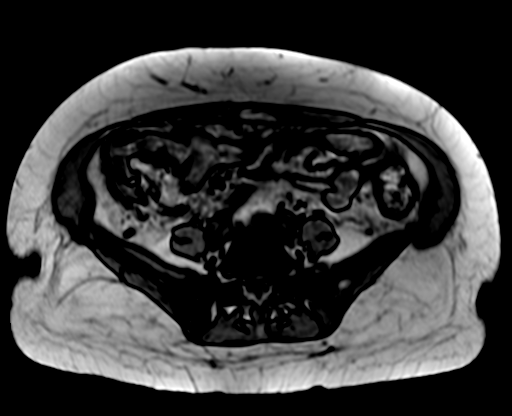

[Series 6: T2 fat-sat · 1 of 3 slices shown (1 of 2)]
[im 1/3]
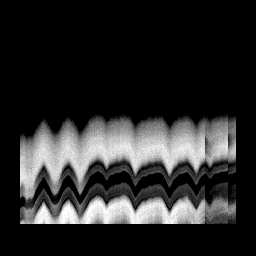

[Series 8: T2 fat-sat · axial · 6.0mm · 1.19mm/px · 1 of 34 slices shown (2 of 2)]
[im 1/34]
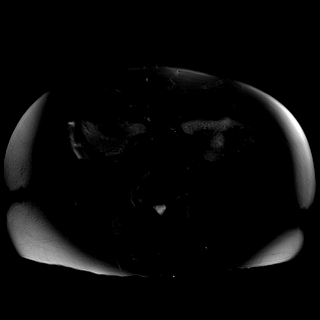

[Series 9: ax dwi_tracew · axial · 6.0mm · 1.42mm/px · z∈[-115,+123]mm · 3 of 102 slices shown]
[im 1/102]
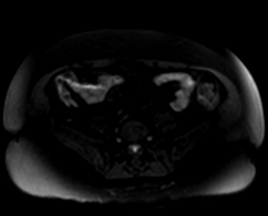
[im 51/102]
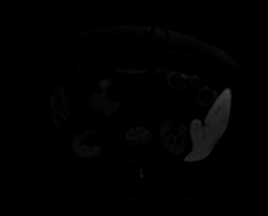
[im 102/102]
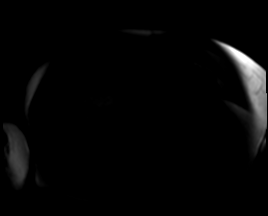

[Series 10: ax dwi_adc · axial · 6.0mm · 1.42mm/px · 1 of 34 slices shown]
[im 1/34]
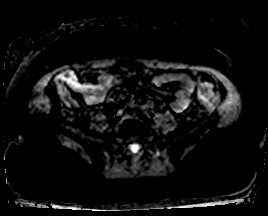

[Series 11: T1 dynamic fat-sat · axial · non-contrast · 3.0mm · 1.19mm/px · z∈[-126,+135]mm · 3 of 88 slices shown (1 of 5)]
[im 1/88]
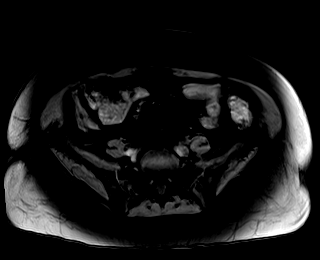
[im 44/88]
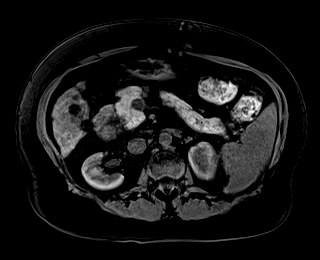
[im 88/88]
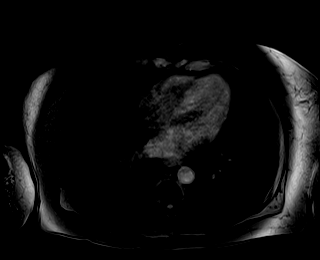

[Series 12: T1 dynamic fat-sat post-contrast · axial · 3.0mm · 1.19mm/px · z∈[-126,+135]mm · 4 of 88 slices shown (1 of 4)]
[im 1/88]
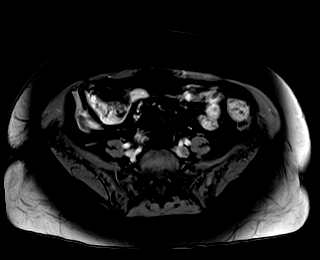
[im 30/88]
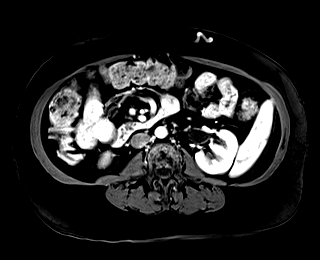
[im 59/88]
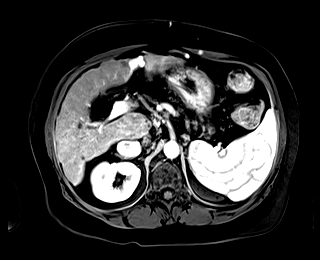
[im 88/88]
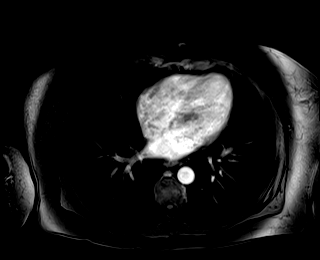

[Series 13: T1 dynamic fat-sat · axial · 3.0mm · 1.19mm/px · z∈[-126,+135]mm · 4 of 88 slices shown (2 of 5)]
[im 1/88]
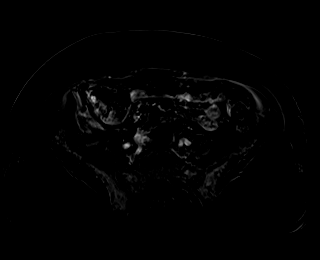
[im 30/88]
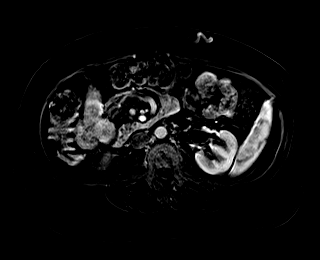
[im 59/88]
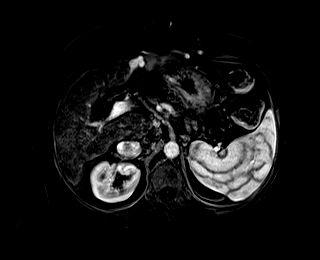
[im 88/88]
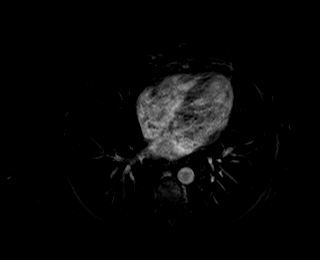

[Series 14: T1 dynamic fat-sat post-contrast · axial · 3.0mm · 1.19mm/px · z∈[-126,+135]mm · 4 of 88 slices shown (2 of 4)]
[im 1/88]
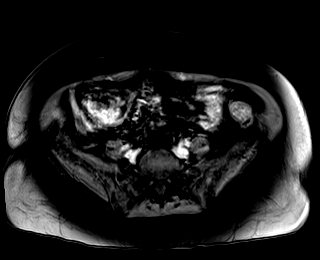
[im 30/88]
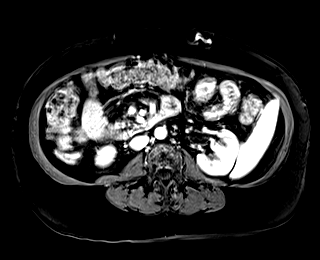
[im 59/88]
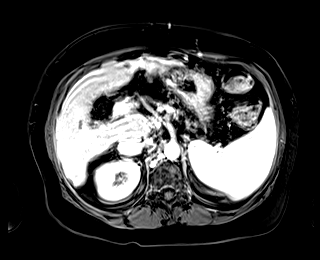
[im 88/88]
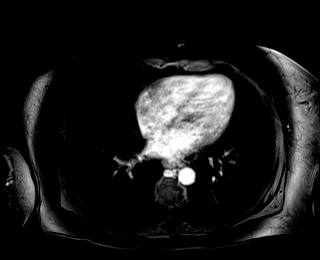

[Series 15: T1 dynamic fat-sat · axial · 3.0mm · 1.19mm/px · z∈[-126,+135]mm · 4 of 88 slices shown (3 of 5)]
[im 1/88]
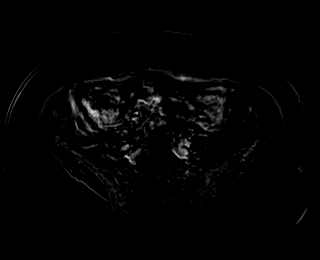
[im 30/88]
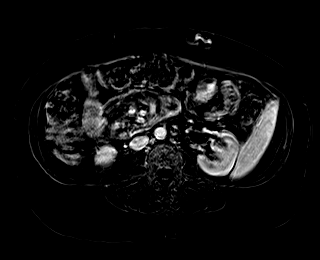
[im 59/88]
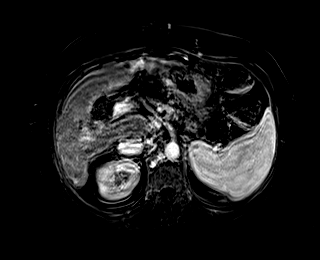
[im 88/88]
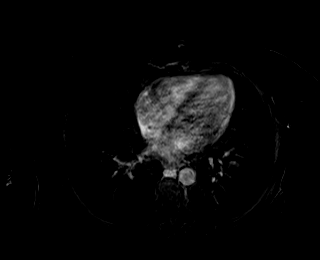

[Series 16: T1 dynamic fat-sat post-contrast · axial · 3.0mm · 1.19mm/px · z∈[-126,+135]mm · 4 of 88 slices shown (3 of 4)]
[im 1/88]
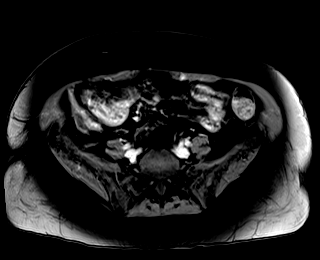
[im 30/88]
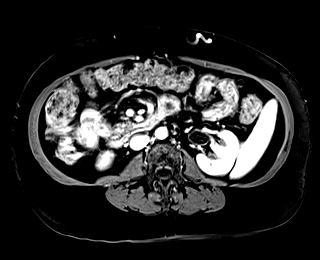
[im 59/88]
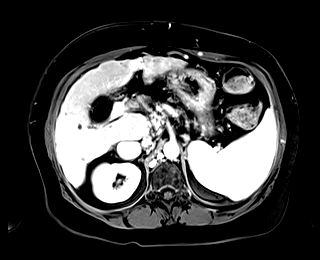
[im 88/88]
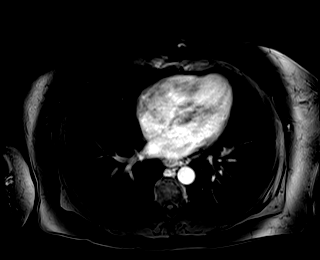

[Series 17: T1 dynamic fat-sat · axial · 3.0mm · 1.19mm/px · z∈[-126,+135]mm · 4 of 88 slices shown (4 of 5)]
[im 1/88]
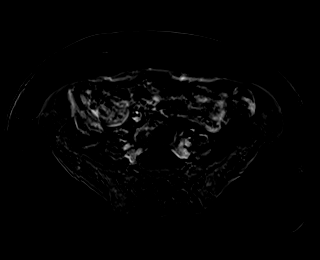
[im 30/88]
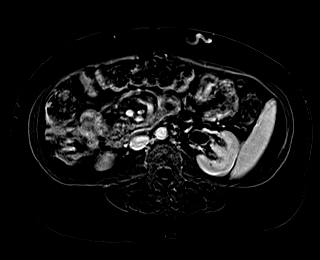
[im 59/88]
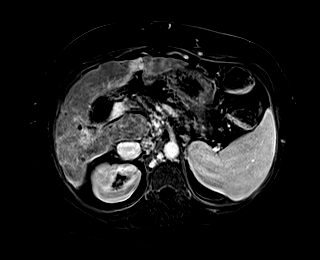
[im 88/88]
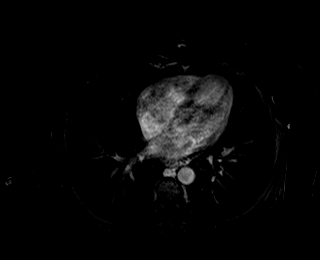

[Series 18: T1 dynamic post-contrast · coronal · 3.0mm · 1.31mm/px · 3 of 72 slices shown]
[im 1/72]
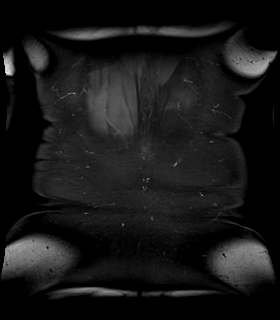
[im 36/72]
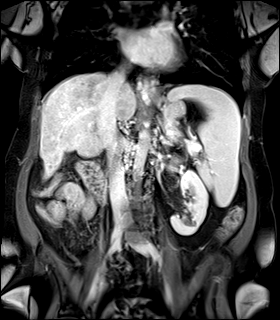
[im 72/72]
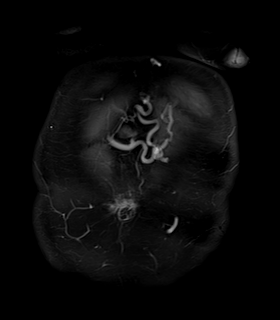

[Series 19: T1 dynamic fat-sat post-contrast · axial · 3.0mm · 1.19mm/px · z∈[-126,+135]mm · 4 of 88 slices shown (4 of 4)]
[im 1/88]
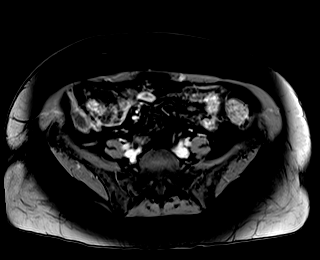
[im 30/88]
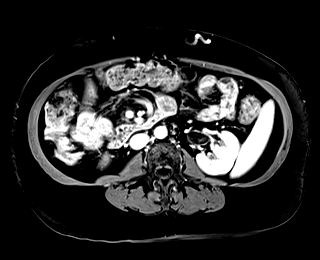
[im 59/88]
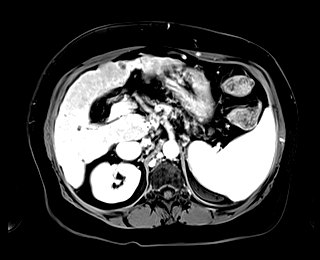
[im 88/88]
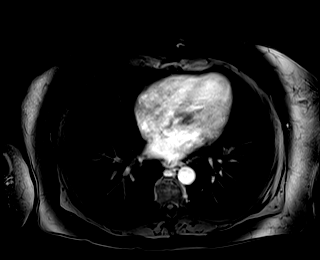

[Series 20: T1 dynamic fat-sat · axial · 3.0mm · 1.19mm/px · z∈[-126,+135]mm · 4 of 88 slices shown (5 of 5)]
[im 1/88]
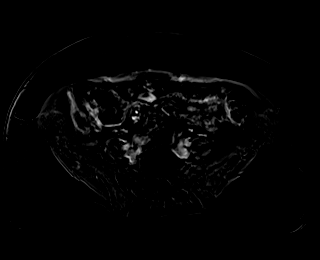
[im 30/88]
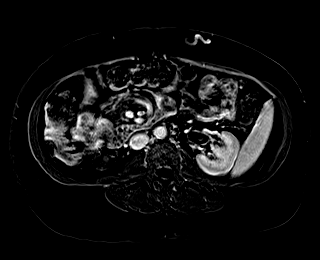
[im 59/88]
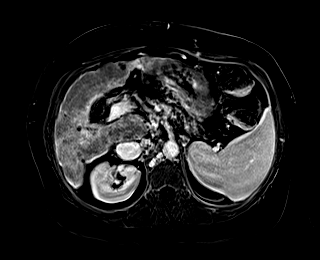
[im 88/88]
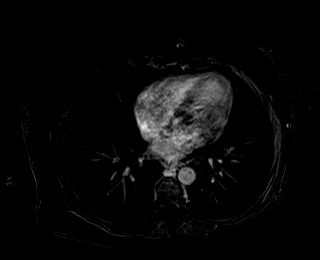

[48 of 48 positions shown; findings below may reference images not displayed]

FINDINGS: Lower chest: The lung bases are unremarkable. No pulmonary lesions,
pleural or pericardial effusion.

Hepatobiliary: Stable advanced cirrhotic changes involving liver
with extensive areas of confluent hepatic fibrosis, regenerating
nodules, portal venous hypertension, portal venous collaterals and
splenomegaly. Stable large and extensive paraesophageal varices.

There are numerous stable benign-appearing nonenhancing renal cysts.
I do not see any early arterial phase enhancing lesions to suggest
dysplastic nodules or HCC.

The gallbladder is surgically absent. No common bile duct dilatation
or common bile duct stones.

Pancreas:  No mass, inflammation or ductal dilatation.

Spleen:  Stable splenomegaly.

Adrenals/Urinary Tract: The adrenal glands are unremarkable and
stable.

Stable 7 mm enhancing midpole lower pole junction region lesion
involving the left kidney posteriorly. This is likely a small
indolent papillary renal cell carcinoma.

No new lesions.  No hydronephrosis.

Stomach/Bowel: The stomach, duodenum, visualized small bowel and
visualized colon are grossly normal.

Vascular/Lymphatic: The aorta and branch vessels are patent. The
major venous structures are patent. Stable extensive portal venous
collaterals.

Stable upper abdominal lymph nodes, typical with cirrhosis.

Other: No ascites. Stable periumbilical abdominal wall hernia
containing fat.

Musculoskeletal: No significant bony findings.
IMPRESSION: 1. Stable 7 mm enhancing exophytic left renal lesion. This is likely
a small indolent papillary renal cell carcinoma.
2. Stable advanced cirrhotic changes involving liver with extensive
areas of confluent hepatic fibrosis, regenerating nodules, portal
venous hypertension, portal venous collaterals and splenomegaly. No
ascites.
3. Numerous stable benign-appearing renal cysts.
4. Stable splenomegaly.

## 2021-01-08 MED ORDER — GADOBUTROL 1 MMOL/ML IV SOLN
7.5000 mL | Freq: Once | INTRAVENOUS | Status: AC | PRN
  Administered 2021-01-08: 7.5 mL via INTRAVENOUS

## 2021-01-08 NOTE — Progress Notes (Signed)
Roberta Lane  [phone] Fax:(336) [phone]  ID: [name] OB: 1956/09/24  MR#: [id number]  CSN#:[id number]  Patient Care Team: [place] Pink, MD as PCP - General (Family Medicine) Lloyd Huger, MD as Consulting Physician (Oncology)  CHIEF COMPLAINT: Pancytopenia, iron deficiency anemia.  Left renal mass.  INTERVAL HISTORY: Patient returns to clinic today for repeat return to work, further evaluation, and discussion of her imaging results.  She currently feels well and is at her baseline. She has no neurologic complaints.  She denies any recent fevers or illnesses.  She has a good appetite and denies weight loss.  She denies any chest pain, cough, hemoptysis, or shortness of breath.  She denies any abdominal pain. She denies any nausea, vomiting, constipation, or diarrhea.  She has no melena or hematochezia. She has no urinary complaints.  Patient offers no specific complaints today.  REVIEW OF SYSTEMS:   Review of Systems  Constitutional: Negative.  Negative for fever, malaise/fatigue and weight loss.  Respiratory: Negative.  Negative for cough, hemoptysis and shortness of breath.   Cardiovascular: Negative.  Negative for chest pain and leg swelling.  Gastrointestinal: Negative.  Negative for abdominal pain, blood in stool and melena.  Genitourinary: Negative.  Negative for dysuria and hematuria.  Musculoskeletal: Negative.  Negative for back pain.  Skin: Negative.  Negative for rash.  Neurological: Negative.  Negative for sensory change, focal weakness and weakness.  Psychiatric/Behavioral: Negative.  The patient is not nervous/anxious.     As per HPI. Otherwise, a complete review of systems is negative.  PAST MEDICAL HISTORY: Past Medical History:  Diagnosis Date  . Allergic genetic state   . Aortic ejection murmur   . Depression   . Diabetes mellitus without complication ([place])   . Dysrhythmia   . GERD (gastroesophageal reflux  disease)   . Headache   . Hepatic cirrhosis ([place])   . Hepatitis C   . Hernia, abdominal   . Hyperlipidemia   . Hypertension   . Irregular heart rhythm   . Liver disease   . Palpitations   . Portal hypertension with esophageal varices (H[facility])   . UTI (urinary tract infection)     PAST SURGICAL HISTORY: Past Surgical History:  Procedure Laterality Date  . ABDOMINAL HYSTERECTOMY    . CHOLECYSTECTOMY    . ESOPHAGOGASTRODUODENOSCOPY (EGD) WITH PROPOFOL N/A 11/23/2017   Procedure: ESOPHAGOGASTRODUODENOSCOPY (EGD) WITH PROPOFOL;  Surgeon: Toledo, Benay Pike, MD;  Location: [facility] ENDOSCOPY;  Service: Gastroenterology;  Laterality: N/A;  . ESOPHAGOGASTRODUODENOSCOPY (EGD) WITH PROPOFOL N/A 10/23/2018   Procedure: ESOPHAGOGASTRODUODENOSCOPY (EGD) WITH PROPOFOL;  Surgeon: Toledo, Benay Pike, MD;  Location: [facility] ENDOSCOPY;  Service: Gastroenterology;  Laterality: N/A;  . ESOPHAGOGASTRODUODENOSCOPY (EGD) WITH PROPOFOL N/A 10/10/2019   Procedure: ESOPHAGOGASTRODUODENOSCOPY (EGD) WITH PROPOFOL;  Surgeon: Toledo, Benay Pike, MD;  Location: [facility] ENDOSCOPY;  Service: Gastroenterology;  Laterality: N/A;  . ESOPHAGOGASTRODUODENOSCOPY (EGD) WITH PROPOFOL N/A 11/14/2019   Procedure: ESOPHAGOGASTRODUODENOSCOPY (EGD) WITH PROPOFOL;  Surgeon: Toledo, Benay Pike, MD;  Location: [facility] ENDOSCOPY;  Service: Gastroenterology;  Laterality: N/A;  . right elbow surgery    . WISDOM TOOTH EXTRACTION      FAMILY HISTORY: Family History  Problem Relation Age of Onset  . Diabetes Mother   . Heart attack Mother   . Hypertension Father   . Thyroid disease Sister   . Sickle cell anemia Sister   . Breast cancer Neg Hx     ADVANCED DIRECTIVES (Y/N):  N  HEALTH MAINTENANCE: Social History   Tobacco  Use  . Smoking status: Never Smoker  . Smokeless tobacco: Never Used  Vaping Use  . Vaping Use: Never used  Substance Use Topics  . Alcohol use: Not Currently  . Drug use: Never     Colonoscopy:  PAP:  Bone  density:  Lipid panel:  Allergies  Allergen Reactions  . Penicillins Anaphylaxis  . Amlodipine Besylate Itching  . Metronidazole Other (See Comments)    Patient states that she felt "foggy" and "loopy" after taking medication.  . Diphenhydramine-Acetaminophen Other (See Comments)    Made my breast hurt.  . Enalapril   . Furosemide   . Sulfa Antibiotics     Reaction unknown.  . Tylenol [Acetaminophen] Other (See Comments)    Breast pain  . Codeine Itching  . Latex Rash    Current Outpatient Medications  Medication Sig Dispense Refill  . bumetanide (BUMEX) 1 MG tablet Take 1 mg by mouth daily.    . enalapril (VASOTEC) 2.5 MG tablet Take by mouth.    . lactulose (CHRONULAC) 10 GM/15ML solution Take 45 mLs (30 g total) by mouth 2 (two) times daily. 240 mL 0  . metFORMIN (GLUCOPHAGE) 1000 MG tablet Take 1,000 mg by mouth 2 (two) times daily.    . nadolol (CORGARD) 20 MG tablet Take 20 mg by mouth daily.    Marland Kitchen omeprazole (PRILOSEC) 20 MG capsule     . rifaximin (XIFAXAN) 550 MG TABS tablet Take by mouth.    . spironolactone (ALDACTONE) 100 MG tablet Take 100 mg by mouth daily.    . traZODone (DESYREL) 50 MG tablet Take 50 mg by mouth at bedtime.     No current facility-administered medications for this visit.    OBJECTIVE: Vitals:   01/14/21 1411  BP: 128/72  Pulse: 69  Resp: 16  Temp: 97.7 F (36.5 C)  SpO2: 100%     Body mass index is 29.35 kg/m.    ECOG FS:0 - Asymptomatic  General: Well-developed, well-nourished, no acute distress. Eyes: Pink conjunctiva, anicteric sclera. HEENT: Normocephalic, moist mucous membranes. Lungs: No audible wheezing or coughing. Heart: Regular rate and rhythm. Abdomen: Soft, nontender, no obvious distention. Musculoskeletal: No edema, cyanosis, or clubbing. Neuro: Alert, answering all questions appropriately. Cranial nerves grossly intact. Skin: No rashes or petechiae noted. Psych: Normal affect.  LAB RESULTS:  Lab Results   Component Value Date   NA 138 07/21/2018   K 4.1 07/21/2018   CL 112 (H) 07/21/2018   CO2 18 (L) 07/21/2018   GLUCOSE 111 (H) 07/21/2018   BUN 8 07/21/2018   CREATININE 0.66 07/21/2018   CALCIUM 8.6 (L) 07/21/2018   PROT 7.9 07/20/2018   ALBUMIN 3.6 07/20/2018   AST 48 (H) 07/20/2018   ALT 27 07/20/2018   ALKPHOS 91 07/20/2018   BILITOT 2.5 (H) 07/20/2018   GFRNONAA >60 07/21/2018   GFRAA >60 07/21/2018    Lab Results  Component Value Date   WBC 3.5 (L) 01/08/2021   NEUTROABS 2.2 01/08/2021   HGB 11.5 (L) 01/08/2021   HCT 34.0 (L) 01/08/2021   MCV 95.2 01/08/2021   PLT 79 (L) 01/08/2021   Lab Results  Component Value Date   IRON 92 01/08/2021   TIBC 398 01/08/2021   IRONPCTSAT 23 01/08/2021   Lab Results  Component Value Date   FERRITIN 97 01/08/2021     STUDIES: MR Abdomen W Wo Contrast  Result Date: 01/08/2021 CLINICAL DATA:  Follow-up left renal lesion. EXAM: MRI ABDOMEN WITHOUT AND WITH CONTRAST TECHNIQUE:  Multiplanar multisequence MR imaging of the abdomen was performed both before and after the administration of intravenous contrast. CONTRAST:  7.60m GADAVIST GADOBUTROL 1 MMOL/ML IV SOLN COMPARISON:  Prior MRI examinations of the abdomen from 04/02/2020 and 07/16/2019 FINDINGS: Lower chest: The lung bases are unremarkable. No pulmonary lesions, pleural or pericardial effusion. Hepatobiliary: Stable advanced cirrhotic changes involving liver with extensive areas of confluent hepatic fibrosis, regenerating nodules, portal venous hypertension, portal venous collaterals and splenomegaly. Stable large and extensive paraesophageal varices. There are numerous stable benign-appearing nonenhancing renal cysts. I do not see any early arterial phase enhancing lesions to suggest dysplastic nodules or HCC. The gallbladder is surgically absent. No common bile duct dilatation or common bile duct stones. Pancreas:  No mass, inflammation or ductal dilatation. Spleen:  Stable  splenomegaly. Adrenals/Urinary Tract: The adrenal glands are unremarkable and stable. Stable 7 mm enhancing midpole lower pole junction region lesion involving the left kidney posteriorly. 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 No new lesions.  No hydronephrosis. Stomach/Bowel: The stomach, duodenum, visualized small bowel and visualized colon are grossly normal. Vascular/Lymphatic: The aorta and branch vessels are patent. The major venous structures are patent. Stable extensive portal venous collaterals. Stable upper abdominal lymph nodes, typical with cirrhosis. Other: No ascites. Stable periumbilical abdominal wall hernia containing fat. Musculoskeletal: No significant bony findings. IMPRESSION: 1. Stable 7 mm enhancing exophytic left renal lesion. 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 2. Stable advanced cirrhotic changes involving liver with extensive areas of confluent hepatic fibrosis, regenerating nodules, portal venous hypertension, portal venous collaterals and splenomegaly. No ascites. 3. Numerous stable benign-appearing renal cysts. 4. Stable splenomegaly. Electronically Signed   By: PMarijo SanesM.D.   On: 01/08/2021 17:03    ASSESSMENT: Pancytopenia, iron deficiency anemia.  Left renal mass.  PLAN:    1.  Pancytopenia: Multifactorial including cirrhosis and splenomegaly.  Repeat MRI on January 08, 2021 revealed stable splenomegaly of approximately 15 cm.  Patient has a mildly decreased white blood cell count 3.5, stable anemia and thrombocytopenia of 79.  She was previously noted to have antineutrophil antibodies as well as platelet antibodies but both are likely clinically insignificant given the stability of her laboratory work.  No intervention is needed.  Patient does not require bone marrow biopsy. 2.  Cirrhosis: Patient's most recent AFP continues to be within normal limits at 2.6.  Continue follow-up with GI as indicated. 3.  GI bleed:  Patient denies any recent bleeding.  Patient's most recent EGD was in January 2021. 4.  Iron deficiency anemia: Essentially resolved.  Patient's hemoglobin is stable at 11.5 with normal iron stores.  She last received treatment on July 29, 2019.   5.  Left renal mass: MRI results from January 08, 2021 reviewed independently and report as above with essentially unchanged 7 mm left kidney lesion suspicious for indolent papillary renal cell carcinoma.  No intervention is needed.  Continue to monitor with yearly imaging.    Patient expressed understanding and was in agreement with this plan. She also understands that She can call clinic at any time with any questions, concerns, or complaints.    TLloyd Huger MD   01/14/2021 6:01 PM

## 2021-01-14 ENCOUNTER — Inpatient Hospital Stay (HOSPITAL_BASED_OUTPATIENT_CLINIC_OR_DEPARTMENT_OTHER): Payer: Medicare Other | Admitting: Oncology

## 2021-01-14 ENCOUNTER — Inpatient Hospital Stay: Payer: Medicare Other

## 2021-01-14 ENCOUNTER — Encounter: Payer: Self-pay | Admitting: Oncology

## 2021-01-14 VITALS — BP 128/72 | HR 69 | Temp 97.7°F | Resp 16 | Ht 64.0 in | Wt 171.0 lb

## 2021-01-14 DIAGNOSIS — D509 Iron deficiency anemia, unspecified: Secondary | ICD-10-CM

## 2021-01-14 NOTE — Progress Notes (Signed)
Having lower left quadrant pain. Close to belly button. Has this pain frequently. 6-8 weeks. It comes and goes.

## 2021-02-16 ENCOUNTER — Other Ambulatory Visit: Payer: Self-pay | Admitting: Internal Medicine

## 2021-02-16 ENCOUNTER — Encounter: Payer: Self-pay | Admitting: Internal Medicine

## 2021-02-16 DIAGNOSIS — I8500 Esophageal varices without bleeding: Secondary | ICD-10-CM

## 2021-02-17 ENCOUNTER — Ambulatory Visit: Payer: Medicare Other | Admitting: Registered Nurse

## 2021-02-17 ENCOUNTER — Other Ambulatory Visit: Payer: Self-pay

## 2021-02-17 ENCOUNTER — Encounter
Admission: RE | Disposition: A | Discharge status: Home / Self Care | Payer: Self-pay | Source: Home / Self Care | Attending: Internal Medicine

## 2021-02-17 ENCOUNTER — Other Ambulatory Visit: Payer: Self-pay | Admitting: Internal Medicine

## 2021-02-17 ENCOUNTER — Encounter: Payer: Self-pay | Admitting: Internal Medicine

## 2021-02-17 ENCOUNTER — Ambulatory Visit
Admission: RE | Admit: 2021-02-17 | Discharge: 2021-02-17 | Disposition: A | Discharge status: Home / Self Care | Payer: Medicare Other | Attending: Internal Medicine | Admitting: Internal Medicine

## 2021-02-17 DIAGNOSIS — Z882 Allergy status to sulfonamides status: Secondary | ICD-10-CM | POA: Insufficient documentation

## 2021-02-17 DIAGNOSIS — Z886 Allergy status to analgesic agent status: Secondary | ICD-10-CM | POA: Diagnosis not present

## 2021-02-17 DIAGNOSIS — Z885 Allergy status to narcotic agent status: Secondary | ICD-10-CM | POA: Diagnosis not present

## 2021-02-17 DIAGNOSIS — Z87898 Personal history of other specified conditions: Secondary | ICD-10-CM

## 2021-02-17 DIAGNOSIS — Z881 Allergy status to other antibiotic agents status: Secondary | ICD-10-CM | POA: Insufficient documentation

## 2021-02-17 DIAGNOSIS — Z888 Allergy status to other drugs, medicaments and biological substances status: Secondary | ICD-10-CM | POA: Diagnosis not present

## 2021-02-17 DIAGNOSIS — Z79899 Other long term (current) drug therapy: Secondary | ICD-10-CM | POA: Diagnosis not present

## 2021-02-17 DIAGNOSIS — K766 Portal hypertension: Secondary | ICD-10-CM | POA: Diagnosis not present

## 2021-02-17 DIAGNOSIS — Z880 Allergy status to penicillin: Secondary | ICD-10-CM | POA: Diagnosis not present

## 2021-02-17 DIAGNOSIS — Z7984 Long term (current) use of oral hypoglycemic drugs: Secondary | ICD-10-CM | POA: Insufficient documentation

## 2021-02-17 DIAGNOSIS — I8500 Esophageal varices without bleeding: Secondary | ICD-10-CM

## 2021-02-17 DIAGNOSIS — I8510 Secondary esophageal varices without bleeding: Secondary | ICD-10-CM | POA: Insufficient documentation

## 2021-02-17 DIAGNOSIS — Z91040 Latex allergy status: Secondary | ICD-10-CM | POA: Diagnosis not present

## 2021-02-17 HISTORY — DX: Gastrointestinal hemorrhage, unspecified: K92.2

## 2021-02-17 HISTORY — DX: Unspecified abdominal hernia without obstruction or gangrene: K46.9

## 2021-02-17 HISTORY — DX: Chronic viral hepatitis C: B18.2

## 2021-02-17 HISTORY — DX: Other ascites: R18.8

## 2021-02-17 HISTORY — DX: Other pancytopenia: D61.818

## 2021-02-17 HISTORY — DX: Other specified mononeuropathies of unspecified lower limb: G57.80

## 2021-02-17 HISTORY — DX: Inflamed seborrheic keratosis: L82.0

## 2021-02-17 HISTORY — PX: ESOPHAGOGASTRODUODENOSCOPY: SHX5428

## 2021-02-17 HISTORY — DX: Congenital multiple renal cysts: Q61.02

## 2021-02-17 HISTORY — DX: Iron deficiency anemia, unspecified: D50.9

## 2021-02-17 HISTORY — DX: Splenomegaly, not elsewhere classified: R16.1

## 2021-02-17 LAB — GLUCOSE, CAPILLARY: Glucose-Capillary: 101 mg/dL — ABNORMAL HIGH (ref 70–99)

## 2021-02-17 SURGERY — EGD (ESOPHAGOGASTRODUODENOSCOPY)
Anesthesia: General

## 2021-02-17 MED ORDER — LIDOCAINE HCL (CARDIAC) PF 100 MG/5ML IV SOSY
PREFILLED_SYRINGE | INTRAVENOUS | Status: DC | PRN
  Administered 2021-02-17: 100 mg via INTRAVENOUS

## 2021-02-17 MED ORDER — PROPOFOL 500 MG/50ML IV EMUL
INTRAVENOUS | Status: DC | PRN
  Administered 2021-02-17: 150 ug/kg/min via INTRAVENOUS

## 2021-02-17 MED ORDER — SODIUM CHLORIDE 0.9 % IV SOLN
INTRAVENOUS | Status: DC
  Administered 2021-02-17: 1000 mL via INTRAVENOUS

## 2021-02-17 MED ORDER — PROPOFOL 10 MG/ML IV BOLUS
INTRAVENOUS | Status: DC | PRN
  Administered 2021-02-17: 80 mg via INTRAVENOUS

## 2021-02-17 NOTE — Anesthesia Postprocedure Evaluation (Signed)
Anesthesia Post Note  Patient: Roberta Lane  Procedure(s) Performed: ESOPHAGOGASTRODUODENOSCOPY (EGD) (N/A )  Patient location during evaluation: Endoscopy Anesthesia Type: General Level of consciousness: awake and alert and oriented Pain management: pain level controlled Vital Signs Assessment: post-procedure vital signs reviewed and stable Respiratory status: spontaneous breathing, nonlabored ventilation and respiratory function stable Cardiovascular status: blood pressure returned to baseline and stable Postop Assessment: no signs of nausea or vomiting Anesthetic complications: no   No complications documented.   Last Vitals:  Vitals:   02/17/21 1453 02/17/21 1503  BP: (!) 104/59 110/73  Pulse: 80 74  Resp: 17 17  Temp: 36.4 C   SpO2: 95% 98%    Last Pain:  Vitals:   02/17/21 1503  TempSrc:   PainSc: 0-No pain                 Yezenia Fredrick

## 2021-02-17 NOTE — Op Note (Signed)
[facility] Patient Name: Roberta Lane Procedure Date: 02/17/2021 2:31 PM MRN: [id number] Account #: [id number] Date of Birth: November 05, 1955 Admit Type: Outpatient Age: 65 Room: [facility] ENDO ROOM 2 Gender: Female Note Status: Finalized Procedure:             Upper GI endoscopy Indications:           Esophageal varices, Portal venous hypertension Providers:             Benay Pike. Alice Reichert MD, MD Referring MD:          Irven Easterly. Kary Kos, MD (Referring MD) Medicines:             Propofol per Anesthesia Complications:         No immediate complications. Procedure:             Pre-Anesthesia Assessment:                        - The risks and benefits of the procedure and the                         sedation options and risks were discussed with the                         patient. All questions were answered and informed                         consent was obtained.                        - Patient identification and proposed procedure were                         verified prior to the procedure by the nurse. The                         procedure was verified in the procedure room.                        - ASA Grade Assessment: III - A patient with severe                         systemic disease.                        - After reviewing the risks and benefits, the patient                         was deemed in satisfactory condition to undergo the                         procedure.                        After obtaining informed consent, the endoscope was                         passed under direct vision. Throughout the procedure,                         the patient's  blood pressure, pulse, and oxygen                         saturations were monitored continuously. The Endoscope                         was introduced through the mouth, and advanced to the                         third part of duodenum. The upper GI endoscopy was                          accomplished without difficulty. The patient tolerated                         the procedure well. Findings:      Grade I varices were found in the lower third of the esophagus.      There is no endoscopic evidence of bleeding or red wale signs, cherry       red spots, clots, tears or other stigmata or recent bleed in the entire       esophagus.      Mild portal hypertensive gastropathy was found in the entire examined       stomach.      There is no endoscopic evidence of varices in the cardia and in the       gastric fundus.      The examined duodenum was normal.      The exam was otherwise without abnormality. Impression:            - Grade I esophageal varices.                        - Portal hypertensive gastropathy.                        - Normal examined duodenum.                        - The examination was otherwise normal.                        - No specimens collected. Recommendation:        - Patient has a contact number available for                         emergencies. The signs and symptoms of potential                         delayed complications were discussed with the patient.                         Return to normal activities tomorrow. Written                         discharge instructions were provided to the patient.                        - Resume previous diet.                        -  Continue present medications.                        - Repeat upper endoscopy in 2 years for surveillance.                        - Return to GI office in 3 months.                        - The findings and recommendations were discussed with                         the patient. Procedure Code(s):     --- Professional ---                        [phone], Esophagogastroduodenoscopy, flexible,                         transoral; diagnostic, including collection of                         specimen(s) by brushing or washing, when performed                         (separate  procedure) Diagnosis Code(s):     --- Professional ---                        K31.89, Other diseases of stomach and duodenum                        K76.6, Portal hypertension                        I85.00, Esophageal varices without bleeding CPT copyright 2019 [employer]. All rights reserved. The codes documented in this report are preliminary and upon coder review may  be revised to meet current compliance requirements. Efrain Sella MD, MD 02/17/2021 2:52:46 PM This report has been signed electronically. Number of Addenda: 0 Note Initiated On: 02/17/2021 2:31 PM Estimated Blood Loss:  Estimated blood loss: none.      [facility]

## 2021-02-17 NOTE — Anesthesia Preprocedure Evaluation (Signed)
Anesthesia Evaluation  Patient identified by MRN, date of birth, ID band Patient awake    Reviewed: Allergy & Precautions, NPO status , Patient's Chart, lab work & pertinent test results  History of Anesthesia Complications Negative for: history of anesthetic complications  Airway Mallampati: II  TM Distance: >3 FB Neck ROM: Full    Dental  (+) Poor Dentition   Pulmonary neg pulmonary ROS, neg sleep apnea, neg COPD,    breath sounds clear to auscultation- rhonchi (-) wheezing      Cardiovascular hypertension, Pt. on medications (-) CAD, (-) Past MI, (-) Cardiac Stents and (-) CABG  Rhythm:Regular Rate:Normal - Systolic murmurs and - Diastolic murmurs    Neuro/Psych  Headaches, neg Seizures PSYCHIATRIC DISORDERS Depression    GI/Hepatic GERD  ,(+) Cirrhosis   Esophageal Varices    , Hepatitis -, C  Endo/Other  diabetes, Oral Hypoglycemic Agents  Renal/GU negative Renal ROS     Musculoskeletal negative musculoskeletal ROS (+)   Abdominal (+) - obese,   Peds  Hematology  (+) anemia ,   Anesthesia Other Findings Past Medical History: No date: Abdominal hernia No date: Allergic genetic state No date: Aortic ejection murmur No date: Ascites No date: Chronic hepatitis C (HCC) No date: Depression No date: Diabetes mellitus without complication (HCC) No date: Dysrhythmia No date: GERD (gastroesophageal reflux disease) No date: GIB (gastrointestinal bleeding) No date: Headache No date: Hepatic cirrhosis (HCC) No date: Hepatitis C No date: Hernia, abdominal No date: Hyperlipidemia No date: Hypertension No date: IDA (iron deficiency anemia) No date: Inflamed seborrheic keratosis No date: Interdigital neuroma of foot No date: Irregular heart rhythm No date: Liver disease No date: Multiple renal cysts No date: Palpitations No date: Pancytopenia (HCC) No date: Portal hypertension with esophageal varices  (HCC) No date: Splenomegaly No date: UTI (urinary tract infection)   Reproductive/Obstetrics                             Anesthesia Physical Anesthesia Plan  ASA: III  Anesthesia Plan: General   Post-op Pain Management:    Induction: Intravenous  PONV Risk Score and Plan: 2 and Propofol infusion  Airway Management Planned: Natural Airway  Additional Equipment:   Intra-op Plan:   Post-operative Plan:   Informed Consent: I have reviewed the patients History and Physical, chart, labs and discussed the procedure including the risks, benefits and alternatives for the proposed anesthesia with the patient or authorized representative who has indicated his/her understanding and acceptance.     Dental advisory given  Plan Discussed with: CRNA and Anesthesiologist  Anesthesia Plan Comments:         Anesthesia Quick Evaluation

## 2021-02-17 NOTE — Interval H&P Note (Signed)
History and Physical Interval Note:  02/17/2021 2:32 PM  Roberta Lane  has presented today for surgery, with the diagnosis of esophageal varices.  The various methods of treatment have been discussed with the patient and family. After consideration of risks, benefits and other options for treatment, the patient has consented to  Procedure(s): ESOPHAGOGASTRODUODENOSCOPY (EGD) (N/A) as a surgical intervention.  The patient's history has been reviewed, patient examined, no change in status, stable for surgery.  I have reviewed the patient's chart and labs.  Questions were answered to the patient's satisfaction.     [place], [place]

## 2021-02-17 NOTE — Transfer of Care (Signed)
Immediate Anesthesia Transfer of Care Note  Patient: Roberta Lane  Procedure(s) Performed: ESOPHAGOGASTRODUODENOSCOPY (EGD) (N/A )  Patient Location: Endoscopy Unit  Anesthesia Type:General  Level of Consciousness: drowsy  Airway & Oxygen Therapy: Patient Spontanous Breathing  Post-op Assessment: Report given to RN and Post -op Vital signs reviewed and stable  Post vital signs: Reviewed and stable  Last Vitals:  Vitals Value Taken Time  BP 104/59 02/17/21 1454  Temp 36.4 C 02/17/21 1453  Pulse 77 02/17/21 1454  Resp 16 02/17/21 1454  SpO2 95 % 02/17/21 1454  Vitals shown include unvalidated device data.  Last Pain:  Vitals:   02/17/21 1453  TempSrc: Temporal  PainSc: Asleep         Complications: No complications documented.

## 2021-02-17 NOTE — H&P (Signed)
Outpatient short stay form Pre-procedure 02/17/2021 2:26 PM Kelce Bouton K. Alice Reichert, [name]  Primary Physician: [place] Pink, [name]  Reason for visit:  Portal hypertension with history of esophageal varices, Epigastric pain.   History of present illness:  65 y/o female with personal history of esophageal varices with history of previous banding. Currently has some mild epigastric pain without significant radiation or aggravating factors.    Current Facility-Administered Medications:  .  0.9 %  sodium chloride infusion, , Intravenous, Continuous, Camiyah Friberg, Benay Pike, MD  Medications Prior to Admission  Medication Sig Dispense Refill Last Dose  . bumetanide (BUMEX) 1 MG tablet Take 1 mg by mouth daily.   02/16/2021 at Unknown time  . enalapril (VASOTEC) 2.5 MG tablet Take by mouth.   02/16/2021 at Unknown time  . lactulose (CHRONULAC) 10 GM/15ML solution Take 45 mLs (30 g total) by mouth 2 (two) times daily. 240 mL 0 02/16/2021 at Unknown time  . metFORMIN (GLUCOPHAGE) 1000 MG tablet Take 1,000 mg by mouth 2 (two) times daily.   02/16/2021 at Unknown time  . nadolol (CORGARD) 20 MG tablet Take 20 mg by mouth daily.   02/16/2021 at Unknown time  . omeprazole (PRILOSEC) 20 MG capsule    02/16/2021 at Unknown time  . rifaximin (XIFAXAN) 550 MG TABS tablet Take by mouth.   02/16/2021 at Unknown time  . spironolactone (ALDACTONE) 100 MG tablet Take 100 mg by mouth daily.   02/16/2021 at Unknown time  . traZODone (DESYREL) 50 MG tablet Take 50 mg by mouth at bedtime.   02/16/2021 at Unknown time     Allergies  Allergen Reactions  . Penicillins Anaphylaxis  . Amlodipine Besylate Itching  . Metronidazole Other (See Comments)    Patient states that she felt "foggy" and "loopy" after taking medication.  . Diphenhydramine-Acetaminophen Other (See Comments)    Made my breast hurt.  . Enalapril   . Furosemide   . Sulfa Antibiotics     Reaction unknown.  . Tylenol [Acetaminophen] Other (See Comments)    Breast  pain  . Codeine Itching  . Latex Rash     Past Medical History:  Diagnosis Date  . Abdominal hernia   . Allergic genetic state   . Aortic ejection murmur   . Ascites   . Chronic hepatitis C ([place])   . Depression   . Diabetes mellitus without complication ([place])   . Dysrhythmia   . GERD (gastroesophageal reflux disease)   . GIB (gastrointestinal bleeding)   . Headache   . Hepatic cirrhosis ([place])   . Hepatitis C   . Hernia, abdominal   . Hyperlipidemia   . Hypertension   . IDA (iron deficiency anemia)   . Inflamed seborrheic keratosis   . Interdigital neuroma of foot   . Irregular heart rhythm   . Liver disease   . Multiple renal cysts   . Palpitations   . Pancytopenia ([place])   . Portal hypertension with esophageal varices ([facility])   . Splenomegaly   . UTI (urinary tract infection)     Review of systems:  Otherwise negative.    Physical Exam  Gen: Alert, oriented. Appears stated age.  HEENT: [place]/AT. PERRLA. Lungs: CTA, no wheezes. CV: RR nl S1, S2. Abd: soft, benign, no masses. BS+ Ext: No edema. Pulses 2+    Planned procedures: Proceed with EGD. The patient understands the nature of the planned procedure, indications, risks, alternatives and potential complications including but not limited to bleeding, infection, perforation, damage to internal  organs and possible oversedation/side effects from anesthesia. The patient agrees and gives consent to proceed.  Please refer to procedure notes for findings, recommendations and patient disposition/instructions.     Gerber Penza K. Alice Reichert, [name] Gastroenterology 02/17/2021  2:26 PM

## 2021-02-18 ENCOUNTER — Encounter: Payer: Self-pay | Admitting: Internal Medicine

## 2021-03-12 ENCOUNTER — Ambulatory Visit
Admission: RE | Admit: 2021-03-12 | Discharge: 2021-03-12 | Disposition: A | Discharge status: Home / Self Care | Payer: Medicare Other | Source: Ambulatory Visit | Attending: Internal Medicine | Admitting: Internal Medicine

## 2021-03-12 ENCOUNTER — Other Ambulatory Visit: Payer: Self-pay

## 2021-03-12 DIAGNOSIS — Z87898 Personal history of other specified conditions: Secondary | ICD-10-CM | POA: Insufficient documentation

## 2021-03-12 DIAGNOSIS — K766 Portal hypertension: Secondary | ICD-10-CM | POA: Diagnosis not present

## 2021-03-12 DIAGNOSIS — I8500 Esophageal varices without bleeding: Secondary | ICD-10-CM | POA: Insufficient documentation

## 2021-03-12 IMAGING — US US ABDOMEN COMPLETE
1 series · 13 of 25 positions shown · non-contrast
Comparison: MR abdomen [DATE]. CT abdomen pelvis [DATE]

CLINICAL DATA: Portal hypertension with esophageal varices. Pain
upper abdomen.

EXAM:
ABDOMEN ULTRASOUND COMPLETE

[Series 1: us abdomen complete · 13 of 79 slices shown]
[im 1/79]
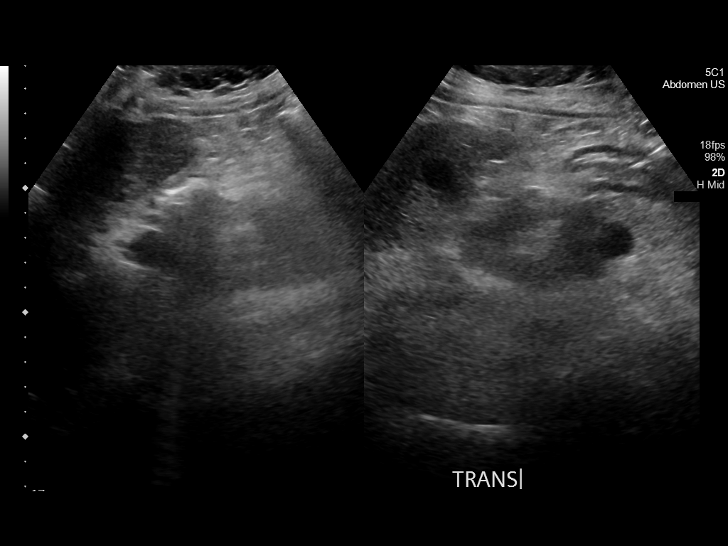
[im 7/79]
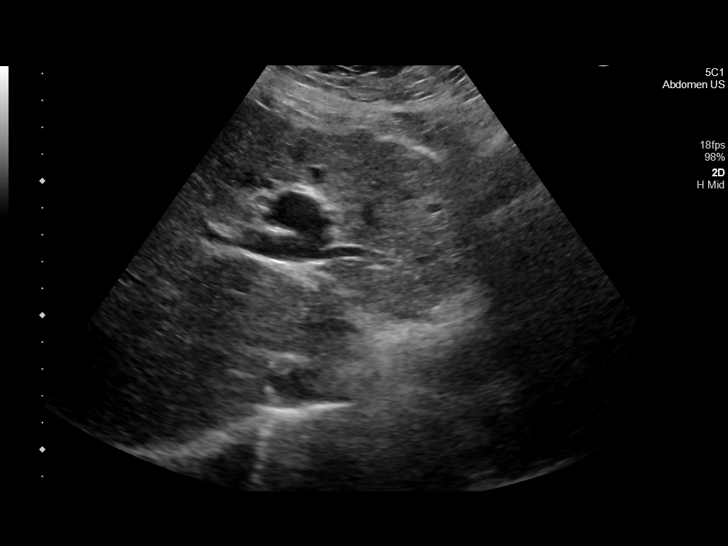
[im 14/79]
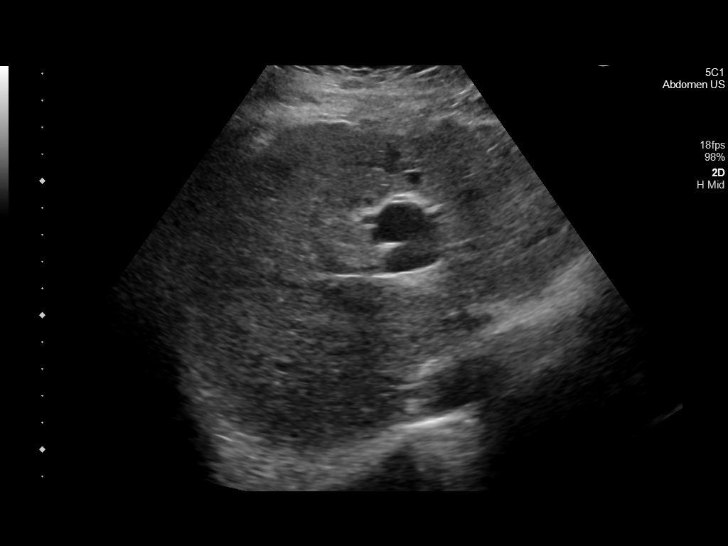
[im 20/79]
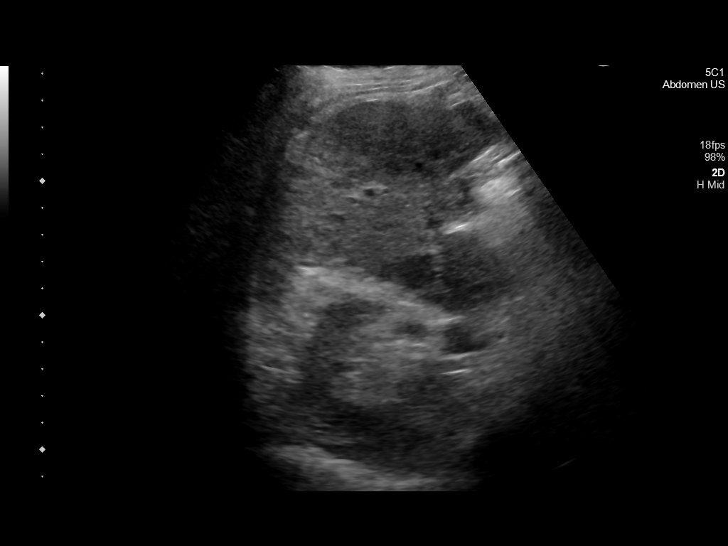
[im 27/79]
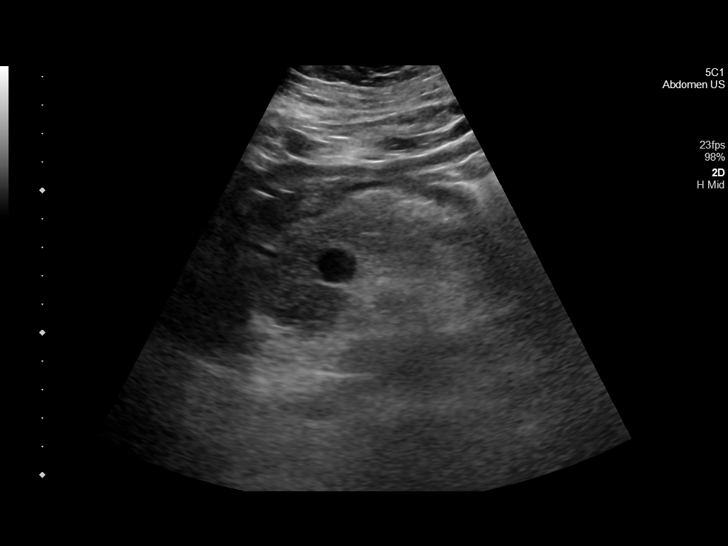
[im 33/79]
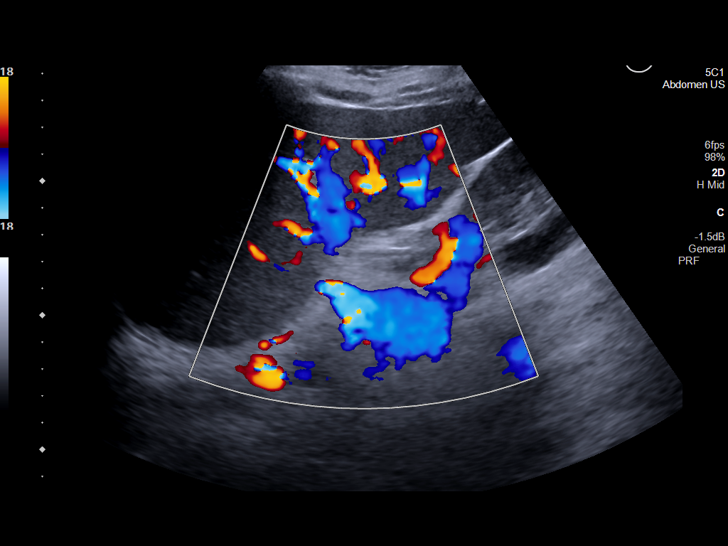
[im 40/79]
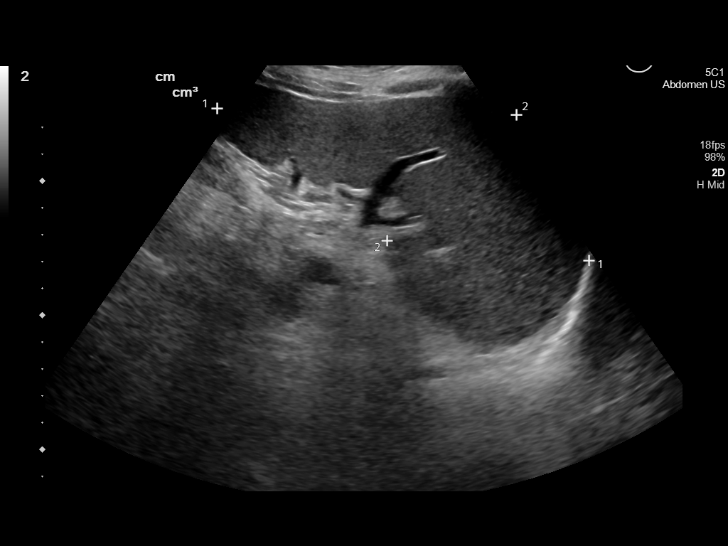
[im 46/79]
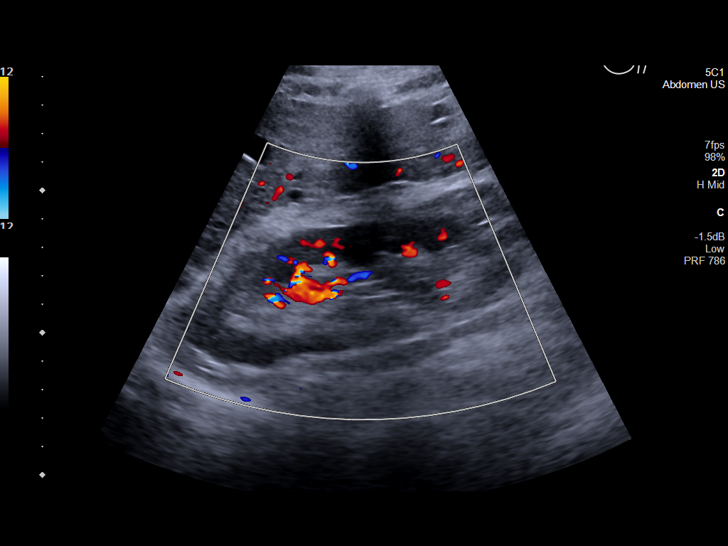
[im 53/79]
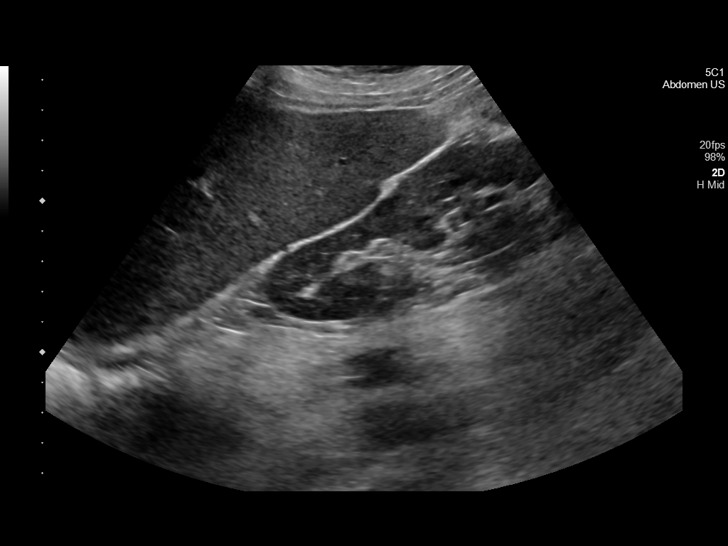
[im 59/79]
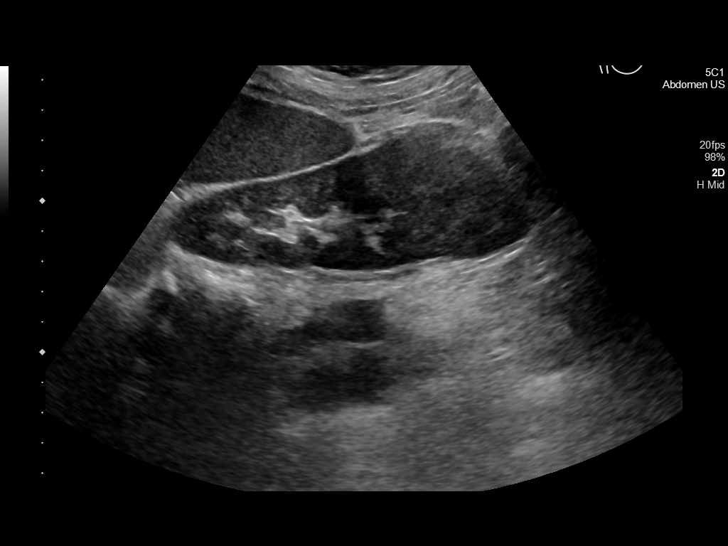
[im 66/79]
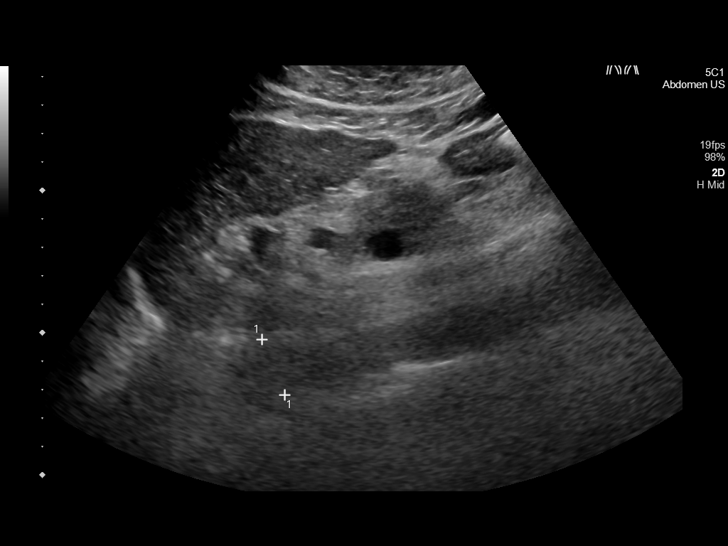
[im 72/79]
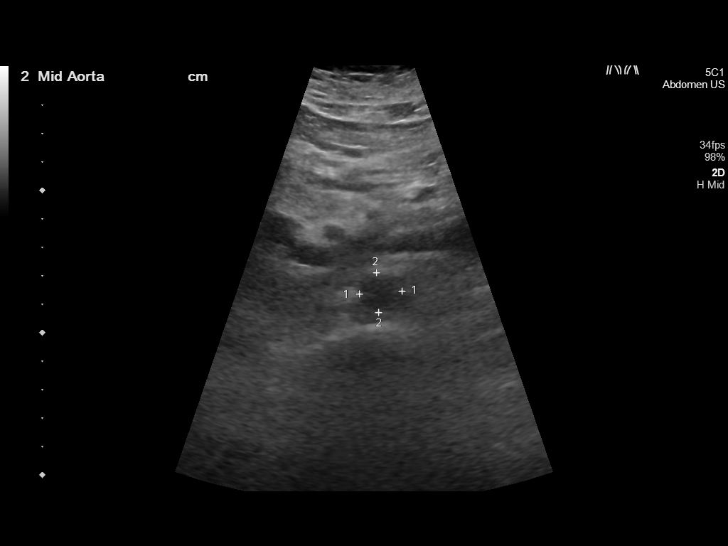
[im 79/79]
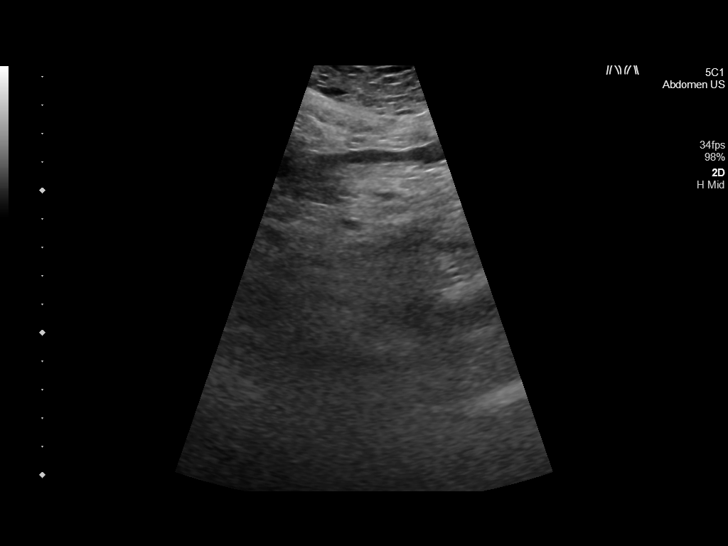

[13 of 25 positions shown; findings below may reference images not displayed]

FINDINGS: Gallbladder: Status post cholecystectomy.

Common bile duct: Diameter: Normal.

Liver: Nodular hepatic contour. Multiple cystic lesions measuring up
to 2.6 cm. Coarsened and heterogeneous parenchymal echogenicity.
Portal vein is patent on color Doppler imaging with normal direction
of blood flow towards the liver.

IVC: No abnormality visualized.

Pancreas: Visualized portion unremarkable.

Spleen: Enlarged in size. Otherwise appearance within normal limits.

Right Kidney: Length: 12.4 cm. Echogenicity within normal limits. No
mass or hydronephrosis visualized.

Left Kidney: Length: 12.7 cm. Echogenicity within normal limits.
Previously identified subcentimeter lesion on MRI not visualized on
this study. Otherwise no definite mass or hydronephrosis visualized.

Abdominal aorta: No aneurysm visualized.

Other findings: None.
IMPRESSION: 1. Cirrhosis with portal hypertension. Multiple cystic lesions are
again noted and better evaluated on MR abdomen [DATE]. Please
note limited evaluation for focal hepatic masses.
2. Previously identified left renal subcentimeter lesion on MRI not
visualized on this study.

## 2021-03-19 ENCOUNTER — Other Ambulatory Visit: Payer: Self-pay | Admitting: Internal Medicine

## 2021-03-19 ENCOUNTER — Other Ambulatory Visit (HOSPITAL_COMMUNITY): Payer: Self-pay | Admitting: Internal Medicine

## 2021-03-19 DIAGNOSIS — I8500 Esophageal varices without bleeding: Secondary | ICD-10-CM

## 2021-03-25 ENCOUNTER — Other Ambulatory Visit: Payer: Self-pay | Admitting: Family Medicine

## 2021-03-25 DIAGNOSIS — Z1231 Encounter for screening mammogram for malignant neoplasm of breast: Secondary | ICD-10-CM

## 2021-04-09 ENCOUNTER — Ambulatory Visit
Admission: RE | Admit: 2021-04-09 | Discharge: 2021-04-09 | Disposition: A | Discharge status: Home / Self Care | Payer: Medicare Other | Source: Ambulatory Visit | Attending: Family Medicine | Admitting: Family Medicine

## 2021-04-09 ENCOUNTER — Other Ambulatory Visit: Payer: Self-pay

## 2021-04-09 DIAGNOSIS — Z1231 Encounter for screening mammogram for malignant neoplasm of breast: Secondary | ICD-10-CM | POA: Diagnosis present

## 2021-04-09 IMAGING — MG MM DIGITAL SCREENING BILAT W/ TOMO AND CAD
8 series · 8 of 24 positions shown · non-contrast
Comparison: Previous exam(s).

CLINICAL DATA: Screening.

EXAM:
DIGITAL SCREENING BILATERAL MAMMOGRAM WITH TOMOSYNTHESIS AND CAD
TECHNIQUE: Bilateral screening digital craniocaudal and mediolateral oblique
mammograms were obtained. Bilateral screening digital breast
tomosynthesis was performed. The images were evaluated with
computer-aided detection.

[L MLO synth-2D]
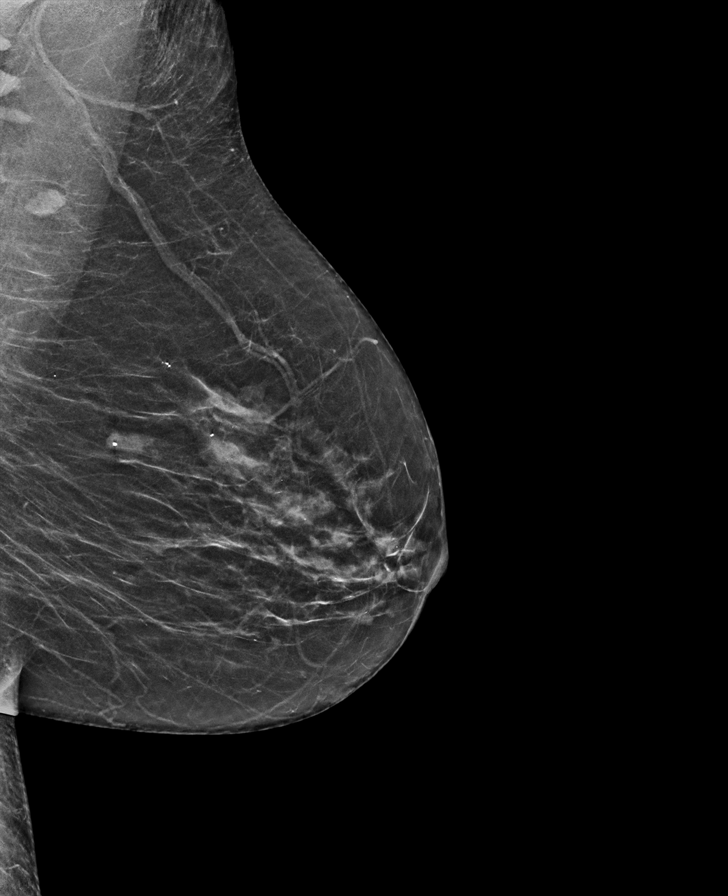

[L CC synth-2D]
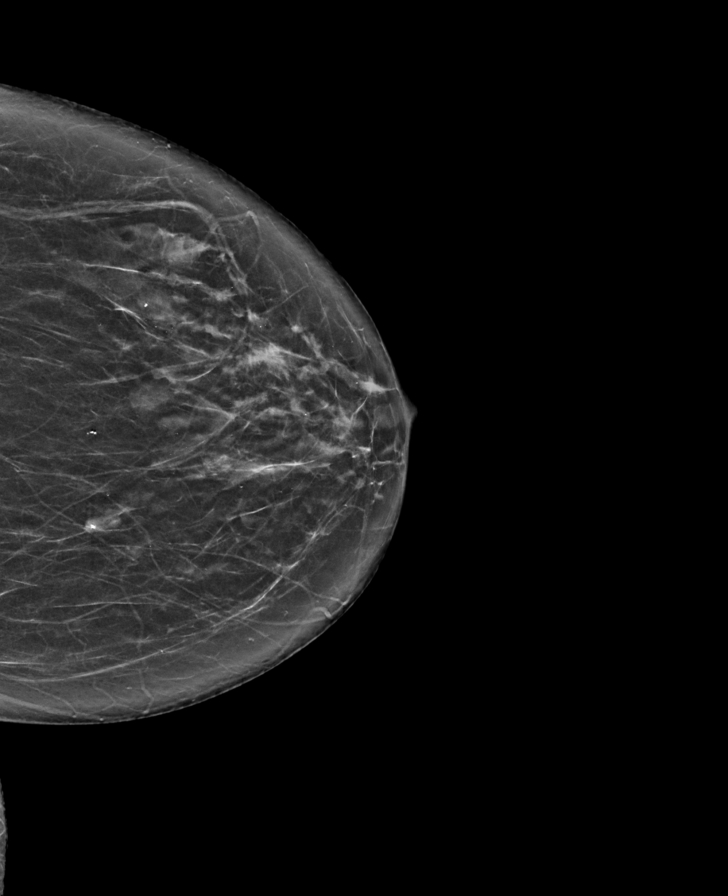

[R MLO synth-2D]
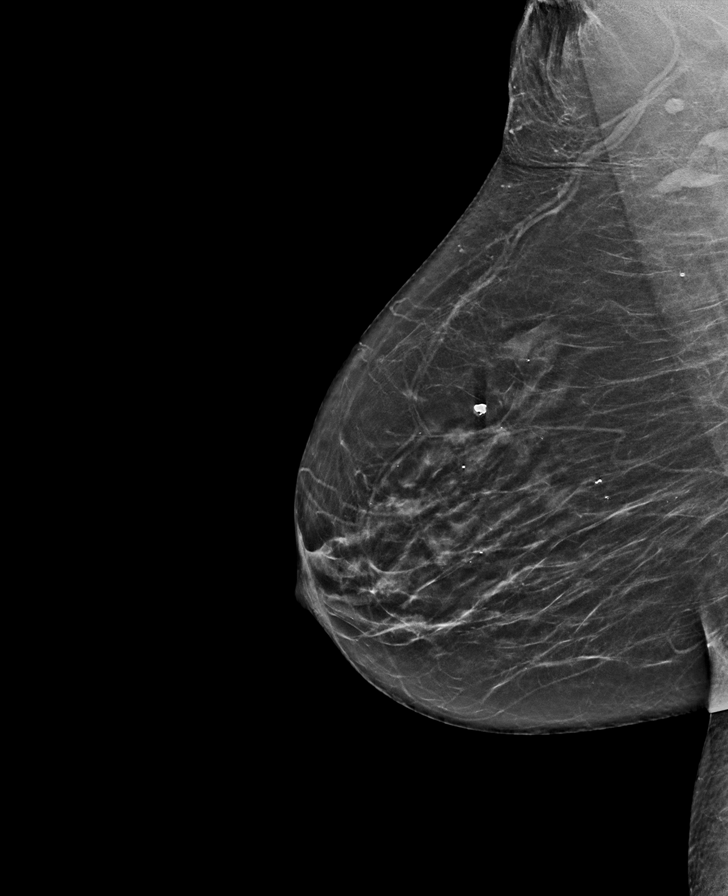

[R CC synth-2D]
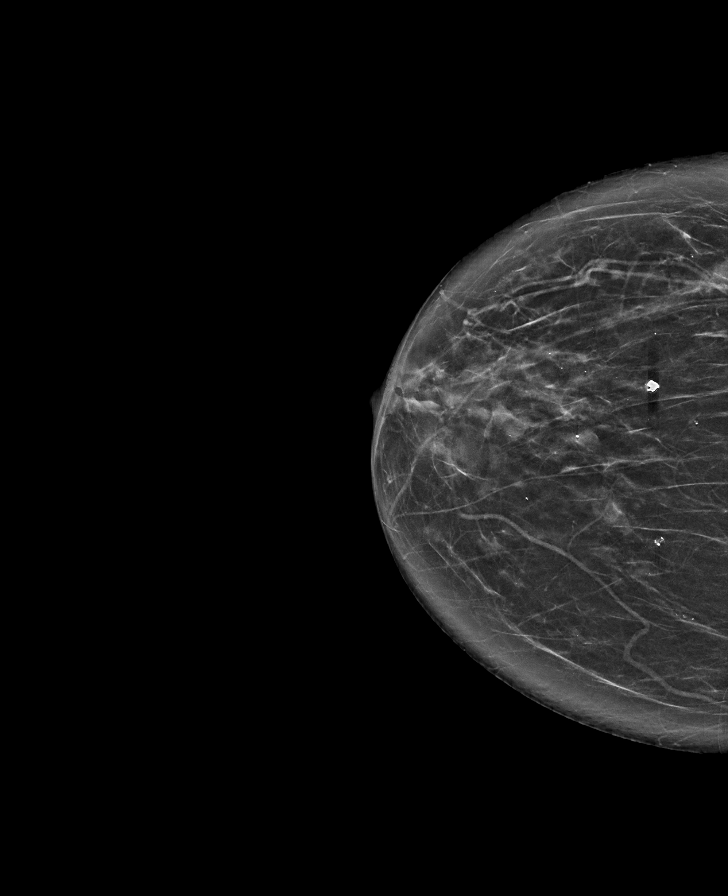

[L CC tomo · tomo slice 35/68.0]
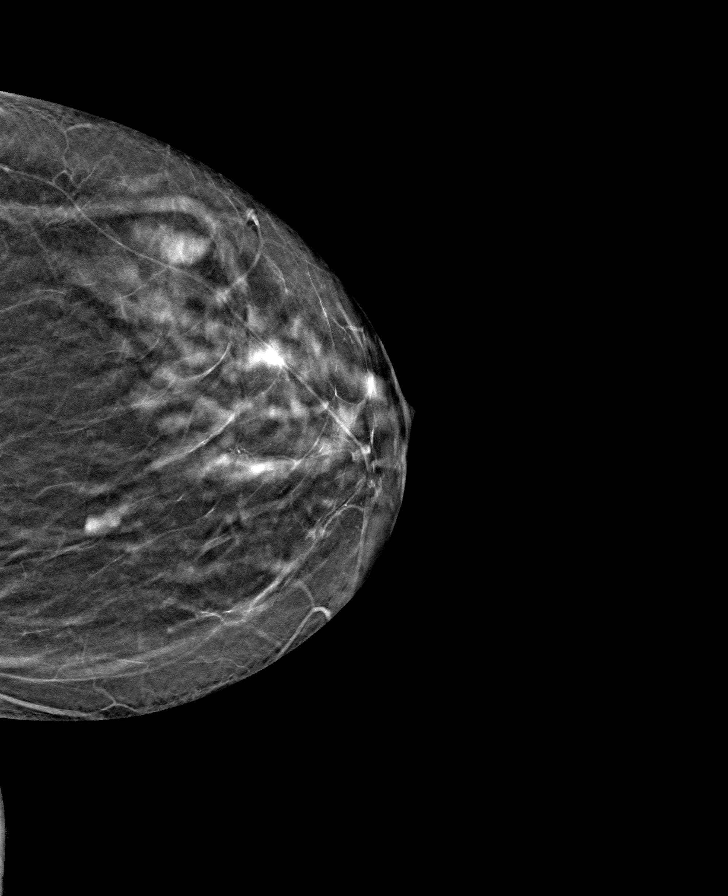

[L MLO tomo · tomo slice 29/56.0]
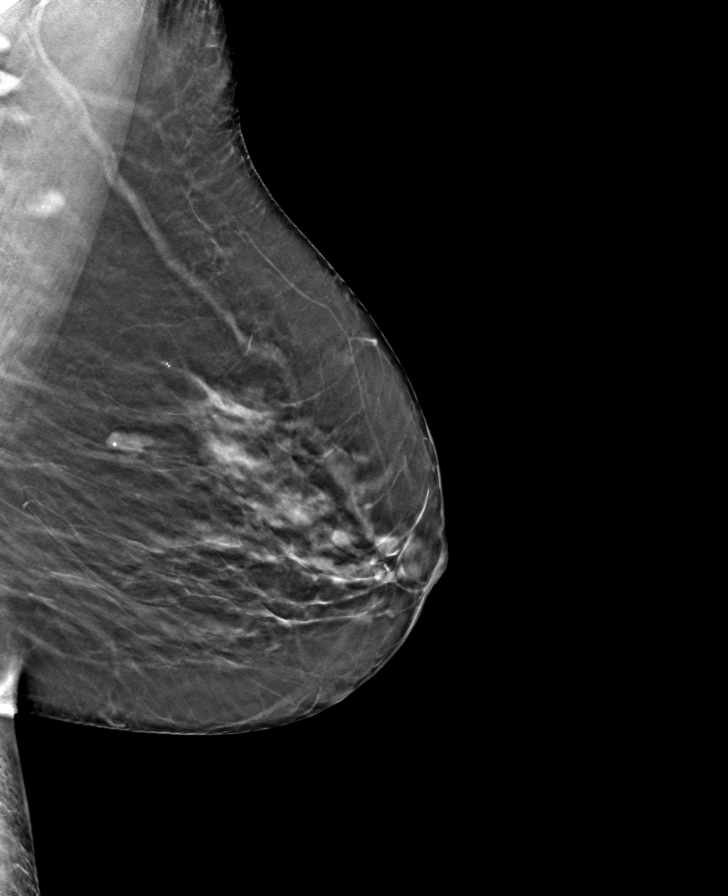

[R MLO tomo · tomo slice 31/60.0]
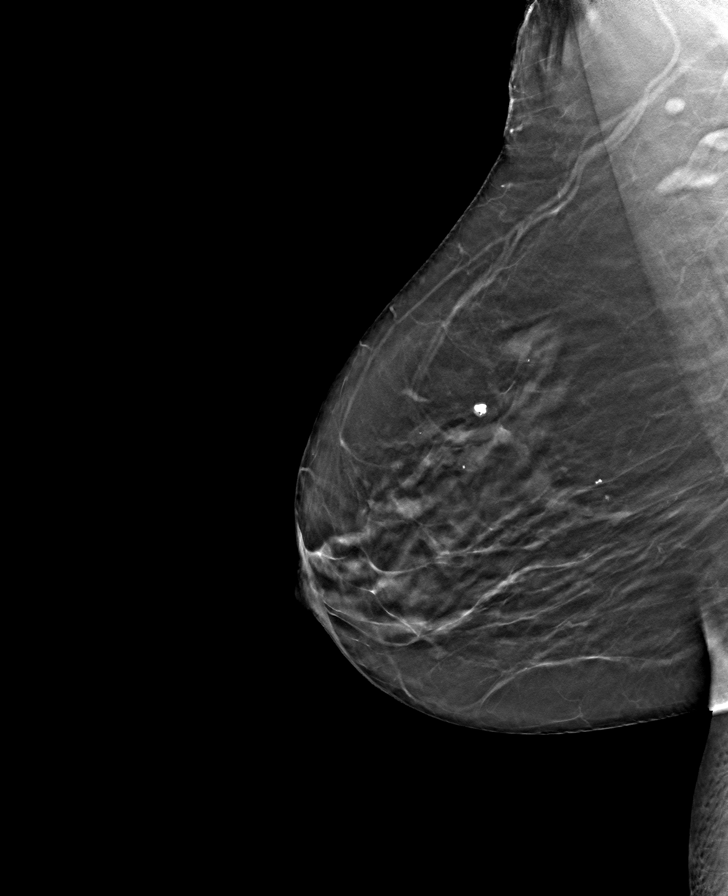

[R CC tomo · tomo slice 35/68.0]
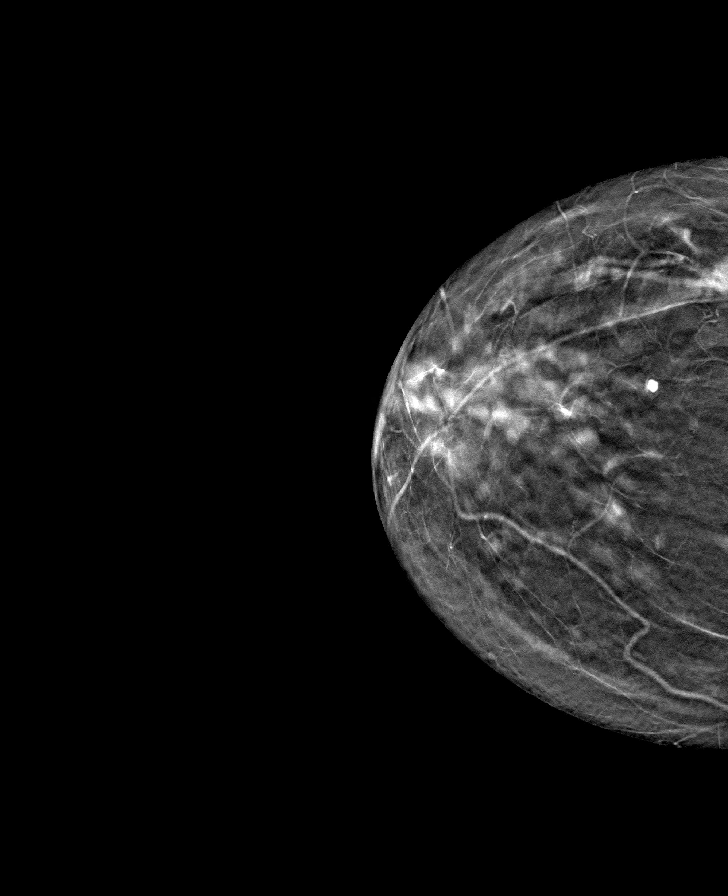

[8 of 24 positions shown; findings below may reference images not displayed]

ACR Breast Density Category c: The breast tissue is heterogeneously
dense, which may obscure small masses.
FINDINGS: In the right breast possible calcifications require further
evaluation.

In the left breast a possible asymmetry requires further evaluation.
IMPRESSION: Further evaluation is suggested for possible calcifications in the
right breast.

Further evaluation is suggested for possible asymmetry in the left
breast.

RECOMMENDATION:
Diagnostic mammogram and possibly ultrasound of both breasts.
(Code:[76])

The patient will be contacted regarding the findings, and additional
imaging will be scheduled.

BI-RADS CATEGORY  0: Incomplete. Need additional imaging evaluation
and/or prior mammograms for comparison.

## 2021-04-14 ENCOUNTER — Other Ambulatory Visit: Payer: Self-pay | Admitting: Family Medicine

## 2021-04-14 DIAGNOSIS — N6489 Other specified disorders of breast: Secondary | ICD-10-CM

## 2021-04-14 DIAGNOSIS — R921 Mammographic calcification found on diagnostic imaging of breast: Secondary | ICD-10-CM

## 2021-04-14 DIAGNOSIS — R928 Other abnormal and inconclusive findings on diagnostic imaging of breast: Secondary | ICD-10-CM

## 2021-04-16 ENCOUNTER — Ambulatory Visit
Admission: RE | Admit: 2021-04-16 | Discharge: 2021-04-16 | Disposition: A | Discharge status: Home / Self Care | Payer: Medicare Other | Source: Ambulatory Visit | Attending: Family Medicine | Admitting: Family Medicine

## 2021-04-16 ENCOUNTER — Other Ambulatory Visit: Payer: Self-pay

## 2021-04-16 DIAGNOSIS — R928 Other abnormal and inconclusive findings on diagnostic imaging of breast: Secondary | ICD-10-CM

## 2021-04-16 DIAGNOSIS — N6489 Other specified disorders of breast: Secondary | ICD-10-CM

## 2021-04-16 DIAGNOSIS — R921 Mammographic calcification found on diagnostic imaging of breast: Secondary | ICD-10-CM | POA: Insufficient documentation

## 2021-04-16 IMAGING — MG DIGITAL DIAGNOSTIC BILAT W/ TOMO W/ CAD
6 of 9 series · 6 of 21 positions shown · non-contrast
Comparison: Previous exams.

CLINICAL DATA: Screening recall for possible left breast asymmetry
and right breast calcifications.

EXAM:
DIGITAL DIAGNOSTIC BILATERAL MAMMOGRAM WITH TOMOSYNTHESIS AND CAD
TECHNIQUE: Bilateral digital diagnostic mammography and breast tomosynthesis
was performed. The images were evaluated with computer-aided
detection.

[R ML]
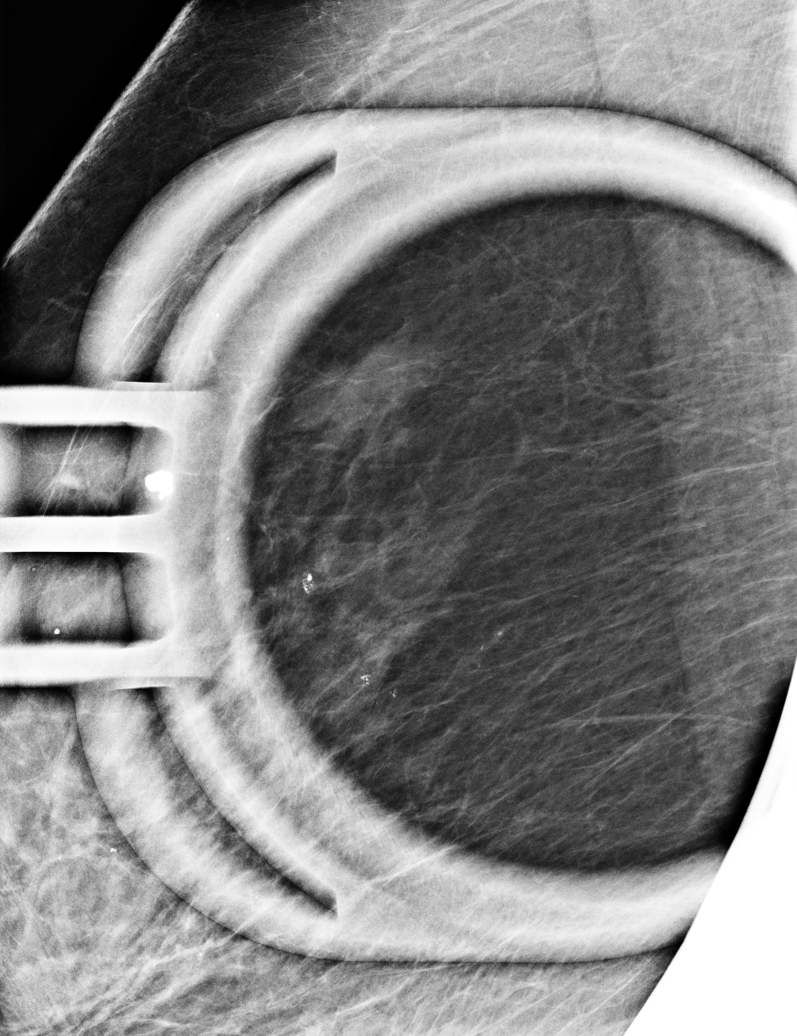

[R CC (1 of 2)]
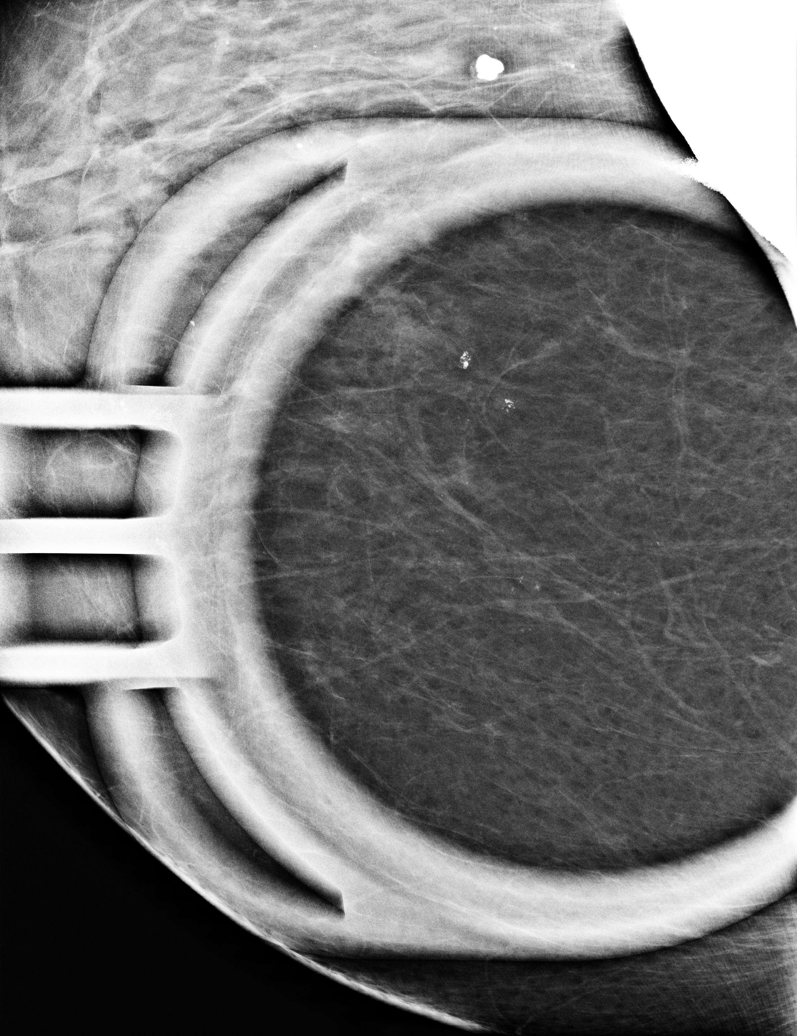

[R CC (2 of 2)]
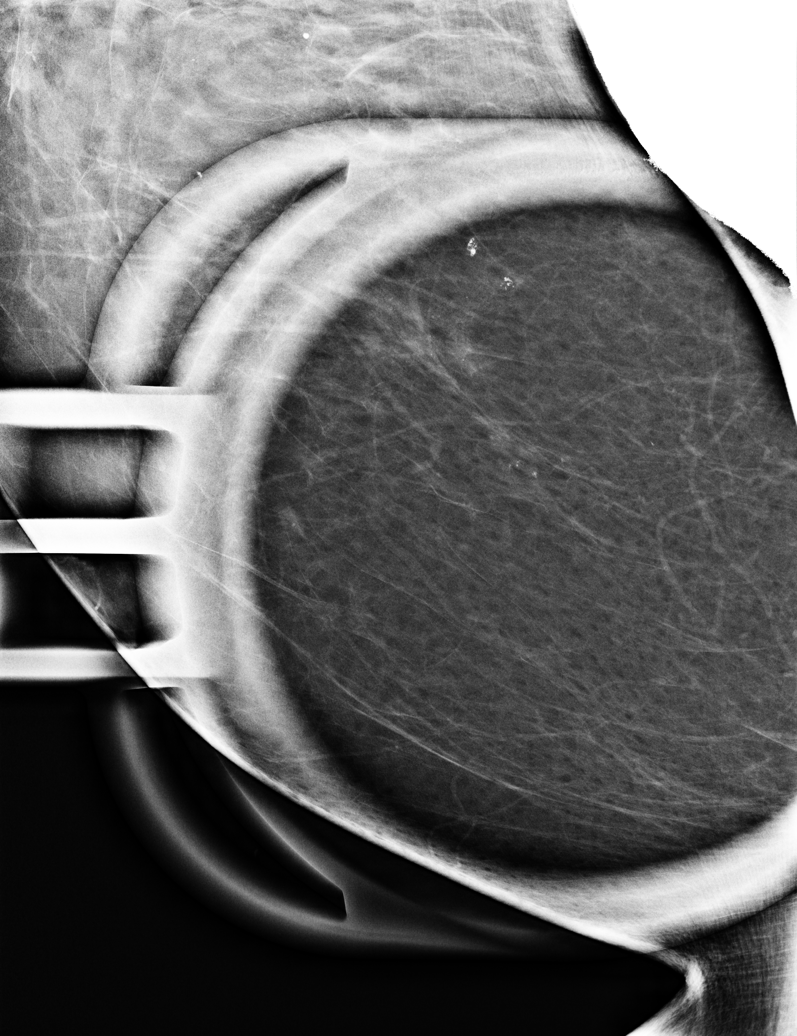

[L ML synth-2D]
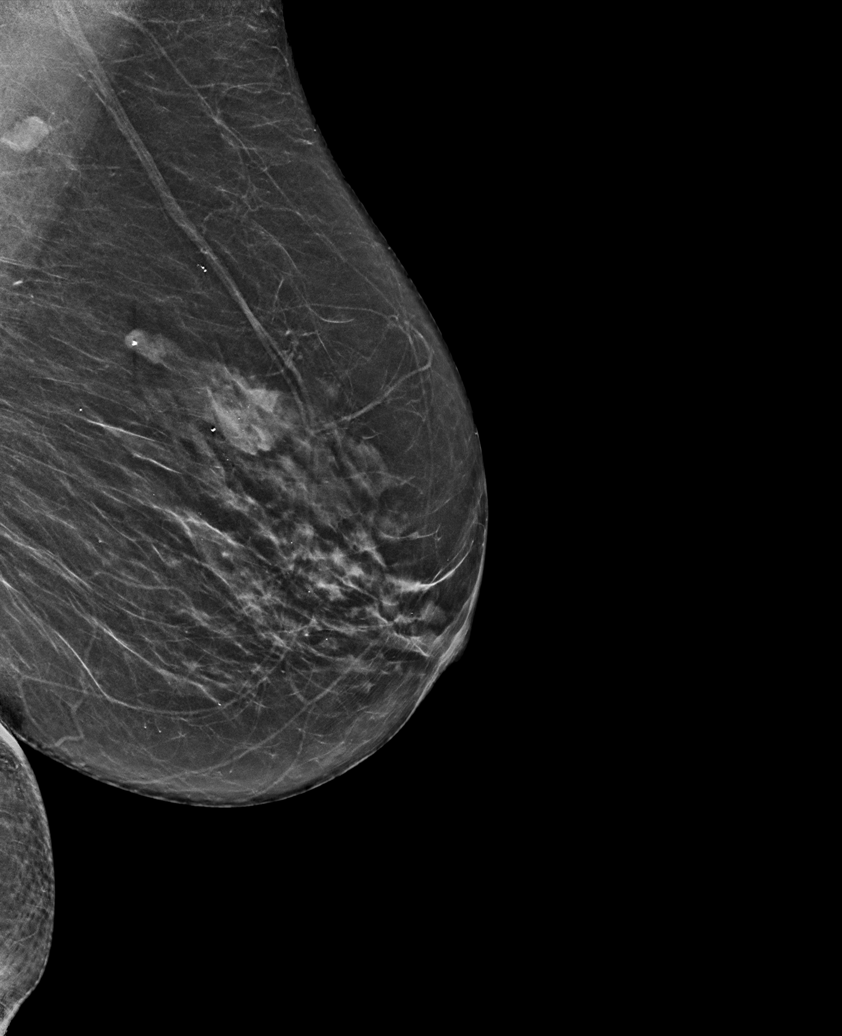

[R ML synth-2D]
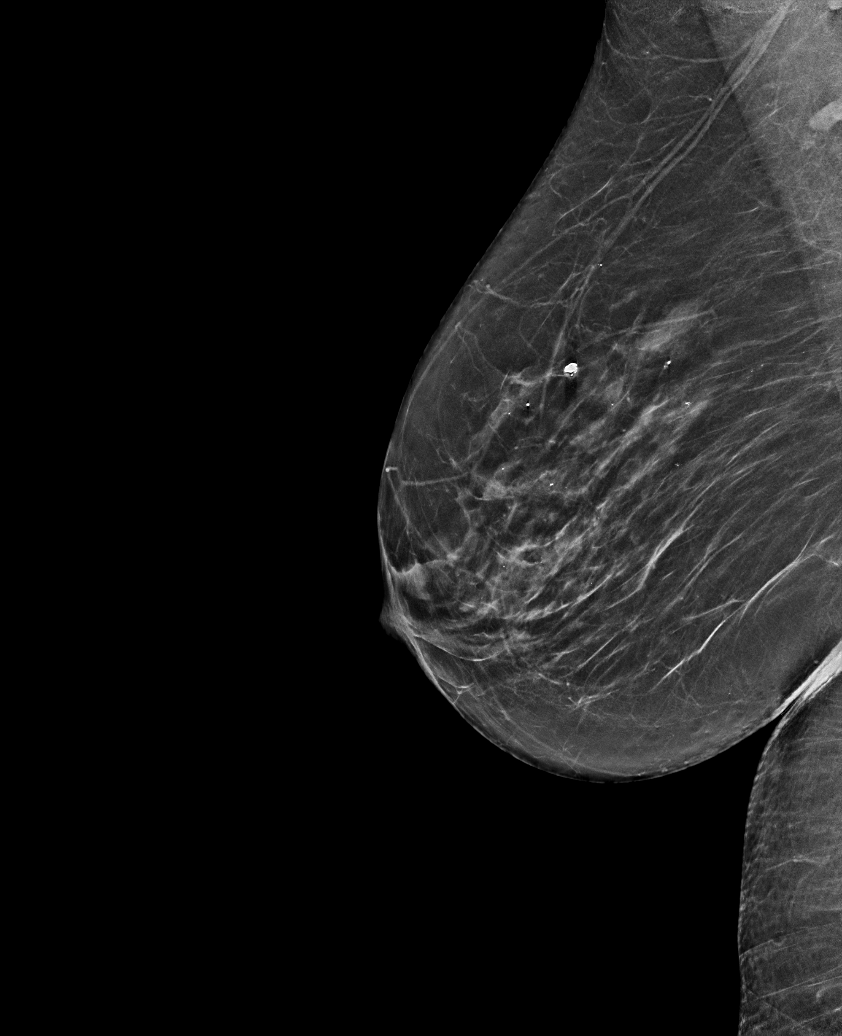

[L CC synth-2D]
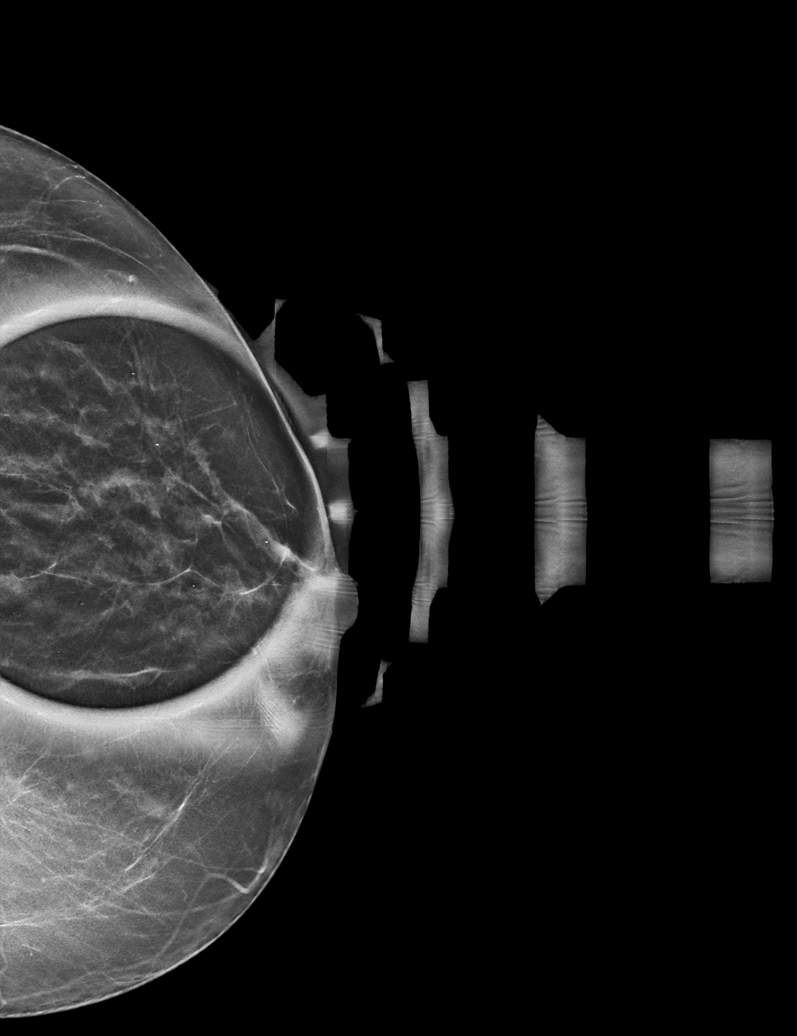

[6 of 21 positions shown; findings below may reference images not displayed]

ACR Breast Density Category c: The breast tissue is heterogeneously
dense, which may obscure small masses.
FINDINGS: Additional tomograms were performed of the left breast. The
initially questioned possible left breast asymmetry resolves on the
additional imaging with findings compatible with an area of
overlapping fibroglandular tissue. There is no mammographic evidence
of malignancy in the left breast.

Spot compression magnification views were performed over the upper
inner right breast. There is a 0.4 cm group of faint punctate
calcifications in the upper inner left breast. Numerous scattered
punctate calcifications are present throughout the inner right
breast as well as scattered coarse calcifications.
IMPRESSION: Probably benign right breast calcifications.

RECOMMENDATION:
Recommend the patient return in 6 months for diagnostic mammography
of the right breast with ultrasound.

I have discussed the findings and recommendations with the patient.
If applicable, a reminder letter will be sent to the patient
regarding the next appointment.

BI-RADS CATEGORY  3: Probably benign.

## 2021-06-23 ENCOUNTER — Ambulatory Visit: Payer: Federal, State, Local not specified - PPO | Admitting: Dermatology

## 2021-07-07 ENCOUNTER — Ambulatory Visit (INDEPENDENT_AMBULATORY_CARE_PROVIDER_SITE_OTHER): Payer: Medicare Other | Admitting: Dermatology

## 2021-07-07 ENCOUNTER — Other Ambulatory Visit: Payer: Self-pay

## 2021-07-07 DIAGNOSIS — L821 Other seborrheic keratosis: Secondary | ICD-10-CM

## 2021-07-07 DIAGNOSIS — L820 Inflamed seborrheic keratosis: Secondary | ICD-10-CM

## 2021-07-07 DIAGNOSIS — L299 Pruritus, unspecified: Secondary | ICD-10-CM

## 2021-07-07 DIAGNOSIS — D1723 Benign lipomatous neoplasm of skin and subcutaneous tissue of right leg: Secondary | ICD-10-CM

## 2021-07-07 DIAGNOSIS — D1720 Benign lipomatous neoplasm of skin and subcutaneous tissue of unspecified limb: Secondary | ICD-10-CM

## 2021-07-07 DIAGNOSIS — R238 Other skin changes: Secondary | ICD-10-CM | POA: Diagnosis not present

## 2021-07-07 NOTE — Progress Notes (Signed)
Follow-Up Visit   Subjective  Roberta Lane is a 65 y.o. female who presents for the following: Tags/moles (Face x years, but getting worse, irritating.), Pruritis (All over, she has tried CeraVe Cream. She has severe liver disease which can cause itching.), and growth (Right ankle, present for years, but growing and becoming irritated from shoe.). She also states that she has a new spot on her left lower lip that she noticed a couple of months ago.   The following portions of the chart were reviewed this encounter and updated as appropriate:       Review of Systems:  No other skin or systemic complaints except as noted in HPI or Assessment and Plan.  Objective  Well appearing patient in no apparent distress; mood and affect are within normal limits.  A focused examination was performed including face, right ankle. Relevant physical exam findings are noted in the Assessment and Plan.  left lateral oral commissure, left lateral eye canthus Blanching violaceous papule.  L zygoma x 1, R zygoma x 1 (2) Erythematous keratotic or waxy stuck-on papule  trunk, extremities Clear  Right Lateral Ankle 5.0cm sq soft rubbery nodule   Assessment & Plan   Seborrheic Keratoses - Stuck-on, waxy, tan-brown papules on BL cheeks - Benign-appearing - Discussed benign etiology and prognosis. - Observe, discussed ED for cosmetic removal. Discussed cosmetic procedure, noncovered.  $60 for 1st lesion and $15 for each additional lesion if done on the same day.  Maximum charge $350.  One touch-up treatment included no charge. Discussed risks of treatment including dyspigmentation, small scar, and/or recurrence. Recommend daily broad spectrum sunscreen SPF 30+/photoprotection to treated areas once healed.  - Call for any changes  [place] left lateral oral commissure, left lateral eye canthus  vs Spider Angioma  Benign. Observation.   Discussed treating with ED. May recur due to  size. Discussed cosmetic procedure, noncovered.  $60 for 1st lesion and $15 for each additional lesion if done on the same day.  Maximum charge $350.  One touch-up treatment included no charge. Discussed risks of treatment including dyspigmentation, small scar, and/or recurrence. Recommend daily broad spectrum sunscreen SPF 30+/photoprotection to treated areas once healed.   Inflamed seborrheic keratosis L zygoma x 1, R zygoma x 1  Destruction of lesion - L zygoma x 1, R zygoma x 1  Destruction method: cryotherapy   Informed consent: discussed and consent obtained   Lesion destroyed using liquid nitrogen: Yes   Region frozen until ice ball extended beyond lesion: Yes   Outcome: patient tolerated procedure well with no complications   Post-procedure details: wound care instructions given   Additional details:  Prior to procedure, discussed risks of blister formation, small wound, skin dyspigmentation, or rare scar following cryotherapy. Recommend Vaseline ointment to treated areas while healing.   Pruritus trunk, extremities  Chronic, Secondary to Liver Disease  Recommend OTC Gold Bond Rapid Relief Anti-Itch cream (pramoxine + menthol), CeraVe Anti-itch (pramoxine), or Sarna lotion (Original- menthol or Sensitive- pramoxine) up to 3 times per day to areas that are itchy.  Recommend mild soap and moisturizing cream 1-2 times daily.   Can discuss with GI doctor for oral options to treat this type of itch if needed  Lipoma of right lower extremity Right Lateral Ankle  Benign, Observe. Symptomatic from rubbing on shoes  Discussed patient seeing podiatrist for removal since bothersome. She sees a podiatrist at [facility] and will schedule appt.   Return if symptoms worsen or fail to improve.  IJamesetta Orleans, CMA, am acting as scribe for Brendolyn Patty, MD .  Documentation: I have reviewed the above documentation for accuracy and completeness, and I agree with the above.  Brendolyn Patty MD

## 2021-07-07 NOTE — Patient Instructions (Addendum)
Recommend OTC Gold Bond Rapid Relief Anti-Itch cream (pramoxine + menthol), CeraVe Anti-itch cream, or Sarna Lotion (Original or Sensitive) up to 3 times per day to areas that are itchy.    Cryotherapy Aftercare  Wash gently with soap and water everyday.   Apply Vaseline and Band-Aid daily until healed.    If you have any questions or concerns for your doctor, please call our main line at [phone] and press option 4 to reach your doctor's medical assistant. If no one answers, please leave a voicemail as directed and we will return your call as soon as possible. Messages left after 4 pm will be answered the following business day.   You may also send [place] a message via [place]. We typically respond to MyChart messages within 1-2 business days.  For prescription refills, please ask your pharmacy to contact our office. Our fax number is [phone].  If you have an urgent issue when the clinic is closed that cannot wait until the next business day, you can page your doctor at the number below.    Please note that while we do our best to be available for urgent issues outside of office hours, we are not available 24/7.   If you have an urgent issue and are unable to reach [place], you may choose to seek medical care at your doctor's office, retail clinic, urgent care center, or emergency room.  If you have a medical emergency, please immediately call 911 or go to the emergency department.  Pager Numbers  - Dr. Nehemiah Massed: [phone]  - Dr. Laurence Ferrari: [phone]  - Dr. Nicole Kindred: [phone]  In the event of inclement weather, please call our main line at [phone] for an update on the status of any delays or closures.  Dermatology Medication Tips: Please keep the boxes that topical medications come in in order to help keep track of the instructions about where and how to use these. Pharmacies typically print the medication instructions only on the boxes and not directly on the medication  tubes.   If your medication is too expensive, please contact our office at [phone] option 4 or send [place] a message through [place].   We are unable to tell what your co-pay for medications will be in advance as this is different depending on your insurance coverage. However, we may be able to find a substitute medication at lower cost or fill out paperwork to get insurance to cover a needed medication.   If a prior authorization is required to get your medication covered by your insurance company, please allow [place] 1-2 business days to complete this process.  Drug prices often vary depending on where the prescription is filled and some pharmacies may offer cheaper prices.  The website www.goodrx.com contains coupons for medications through different pharmacies. The prices here do not account for what the cost may be with help from insurance (it may be cheaper with your insurance), but the website can give you the price if you did not use any insurance.  - You can print the associated coupon and take it with your prescription to the pharmacy.  - You may also stop by our office during regular business hours and pick up a GoodRx coupon card.  - If you need your prescription sent electronically to a different pharmacy, notify our office through [facility] or by phone at [phone] option 4.

## 2021-07-16 ENCOUNTER — Inpatient Hospital Stay: Payer: Medicare Other

## 2021-07-16 ENCOUNTER — Inpatient Hospital Stay: Payer: Medicare Other | Attending: Oncology

## 2021-07-16 ENCOUNTER — Encounter: Payer: Self-pay | Admitting: Oncology

## 2021-07-16 ENCOUNTER — Inpatient Hospital Stay (HOSPITAL_BASED_OUTPATIENT_CLINIC_OR_DEPARTMENT_OTHER): Payer: Medicare Other | Admitting: Oncology

## 2021-07-16 VITALS — BP 109/73 | HR 74

## 2021-07-16 VITALS — BP 143/74 | Temp 97.8°F | Resp 20 | Wt 177.7 lb

## 2021-07-16 DIAGNOSIS — D509 Iron deficiency anemia, unspecified: Secondary | ICD-10-CM | POA: Diagnosis present

## 2021-07-16 DIAGNOSIS — E119 Type 2 diabetes mellitus without complications: Secondary | ICD-10-CM | POA: Diagnosis not present

## 2021-07-16 DIAGNOSIS — D61818 Other pancytopenia: Secondary | ICD-10-CM | POA: Insufficient documentation

## 2021-07-16 DIAGNOSIS — I10 Essential (primary) hypertension: Secondary | ICD-10-CM | POA: Diagnosis not present

## 2021-07-16 DIAGNOSIS — N2889 Other specified disorders of kidney and ureter: Secondary | ICD-10-CM

## 2021-07-16 DIAGNOSIS — K7460 Unspecified cirrhosis of liver: Secondary | ICD-10-CM | POA: Diagnosis not present

## 2021-07-16 DIAGNOSIS — R161 Splenomegaly, not elsewhere classified: Secondary | ICD-10-CM | POA: Insufficient documentation

## 2021-07-16 LAB — CBC WITH DIFFERENTIAL/PLATELET
Abs Immature Granulocytes: 0.01 K/uL (ref 0.00–0.07)
Basophils Absolute: 0 K/uL (ref 0.0–0.1)
Basophils Relative: 1 %
Eosinophils Absolute: 0.1 K/uL (ref 0.0–0.5)
Eosinophils Relative: 3 %
HCT: 32.1 % — ABNORMAL LOW (ref 36.0–46.0)
Hemoglobin: 11 g/dL — ABNORMAL LOW (ref 12.0–15.0)
Immature Granulocytes: 0 %
Lymphocytes Relative: 15 %
Lymphs Abs: 0.4 K/uL — ABNORMAL LOW (ref 0.7–4.0)
MCH: 32.5 pg (ref 26.0–34.0)
MCHC: 34.3 g/dL (ref 30.0–36.0)
MCV: 95 fL (ref 80.0–100.0)
Monocytes Absolute: 0.3 K/uL (ref 0.1–1.0)
Monocytes Relative: 10 %
Neutro Abs: 2 K/uL (ref 1.7–7.7)
Neutrophils Relative %: 71 %
Platelets: 80 K/uL — ABNORMAL LOW (ref 150–400)
RBC: 3.38 MIL/uL — ABNORMAL LOW (ref 3.87–5.11)
RDW: 14.3 % (ref 11.5–15.5)
WBC: 2.9 K/uL — ABNORMAL LOW (ref 4.0–10.5)
nRBC: 0 % (ref 0.0–0.2)

## 2021-07-16 LAB — FERRITIN: Ferritin: 59 ng/mL (ref 11–307)

## 2021-07-16 LAB — IRON AND TIBC
Iron: 86 ug/dL (ref 28–170)
Saturation Ratios: 23 % (ref 10.4–31.8)
TIBC: 382 ug/dL (ref 250–450)
UIBC: 296 ug/dL

## 2021-07-16 MED ORDER — SODIUM CHLORIDE 0.9 % IV SOLN: 200.0000 mg | Freq: Once | INTRAVENOUS | Status: DC

## 2021-07-16 MED ORDER — IRON SUCROSE 20 MG/ML IV SOLN
200.0000 mg | Freq: Once | INTRAVENOUS | Status: AC
  Administered 2021-07-16: 200 mg via INTRAVENOUS
  Filled 2021-07-16: qty 10

## 2021-07-16 MED ORDER — SODIUM CHLORIDE 0.9 % IV SOLN
Freq: Once | INTRAVENOUS | Status: AC
  Filled 2021-07-16: qty 250

## 2021-07-16 NOTE — Progress Notes (Signed)
Patient states she has been feeling very fatigue for the last 3 months. Patient states the right side of her face will feel flushed and will last a few minutes. Patient states it Feels like a warm sensation  on the right side of face also

## 2021-07-16 NOTE — Patient Instructions (Signed)
[facility]  Discharge Instructions: Thank you for choosing [facility] to provide your oncology and hematology care.  If you have a lab appointment with the [facility], please go directly to the [facility] and check in at the registration area.  Wear comfortable clothing and clothing appropriate for easy access to any Portacath or PICC line.   We strive to give you quality time with your provider. You may need to reschedule your appointment if you arrive late (15 or more minutes).  Arriving late affects you and other patients whose appointments are after yours.  Also, if you miss three or more appointments without notifying the office, you may be dismissed from the clinic at the provider's discretion.      For prescription refill requests, have your pharmacy contact our office and allow 72 hours for refills to be completed.    Today you received venofer  Iron Sucrose Injection What is this medication? IRON SUCROSE (EYE ern SOO krose) treats low levels of iron (iron deficiency anemia) in people with kidney disease. Iron is a mineral that plays an important role in making red blood cells, which carry oxygen from your lungs to the rest of your body. This medicine may be used for other purposes; ask your health care provider or pharmacist if you have questions. COMMON BRAND NAME(S): Venofer What should I tell my care team before I take this medication? They need to know if you have any of these conditions: Anemia not caused by low iron levels Heart disease High levels of iron in the blood Kidney disease Liver disease An unusual or allergic reaction to iron, other medications, foods, dyes, or preservatives Pregnant or trying to get pregnant Breast-feeding How should I use this medication? This medication is for infusion into a vein. It is given in a hospital or clinic setting. Talk to your care team about the use of this medication in  children. While this medication may be prescribed for children as young as 2 years for selected conditions, precautions do apply. Overdosage: If you think you have taken too much of this medicine contact a poison control center or emergency room at once. NOTE: This medicine is only for you. Do not share this medicine with others. What if I miss a dose? It is important not to miss your dose. Call your care team if you are unable to keep an appointment. What may interact with this medication? Do not take this medication with any of the following: Deferoxamine Dimercaprol Other iron products This medication may also interact with the following: Chloramphenicol Deferasirox This list may not describe all possible interactions. Give your health care provider a list of all the medicines, herbs, non-prescription drugs, or dietary supplements you use. Also tell them if you smoke, drink alcohol, or use illegal drugs. Some items may interact with your medicine. What should I watch for while using this medication? Visit your care team regularly. Tell your care team if your symptoms do not start to get better or if they get worse. You may need blood work done while you are taking this medication. You may need to follow a special diet. Talk to your care team. Foods that contain iron include: whole grains/cereals, dried fruits, beans, or peas, leafy green vegetables, and organ meats (liver, kidney). What side effects may I notice from receiving this medication? Side effects that you should report to your care team as soon as possible: Allergic reactions-skin rash, itching, hives,  swelling of the face, lips, tongue, or throat Low blood pressure-dizziness, feeling faint or lightheaded, blurry vision Shortness of breath Side effects that usually do not require medical attention (report to your care team if they continue or are bothersome): Flushing Headache Joint pain Muscle pain Nausea Pain, redness, or  irritation at injection site This list may not describe all possible side effects. Call your doctor for medical advice about side effects. You may report side effects to FDA at 1-800-FDA-1088. Where should I keep my medication? This medication is given in a hospital or clinic and will not be stored at home. NOTE: This sheet is a summary. It may not cover all possible information. If you have questions about this medicine, talk to your doctor, pharmacist, or health care provider.  2022 Elsevier/Gold Standard (2021-01-05 12:52:06)    To help prevent nausea and vomiting after your treatment, we encourage you to take your nausea medication as directed.  BELOW ARE SYMPTOMS THAT SHOULD BE REPORTED IMMEDIATELY: *FEVER GREATER THAN 100.4 F (38 C) OR HIGHER *CHILLS OR SWEATING *NAUSEA AND VOMITING THAT IS NOT CONTROLLED WITH YOUR NAUSEA MEDICATION *UNUSUAL SHORTNESS OF BREATH *UNUSUAL BRUISING OR BLEEDING *URINARY PROBLEMS (pain or burning when urinating, or frequent urination) *BOWEL PROBLEMS (unusual diarrhea, constipation, pain near the anus) TENDERNESS IN MOUTH AND THROAT WITH OR WITHOUT PRESENCE OF ULCERS (sore throat, sores in mouth, or a toothache) UNUSUAL RASH, SWELLING OR PAIN  UNUSUAL VAGINAL DISCHARGE OR ITCHING   Items with * indicate a potential emergency and should be followed up as soon as possible or go to the Emergency Department if any problems should occur.  Please show the CHEMOTHERAPY ALERT CARD or IMMUNOTHERAPY ALERT CARD at check-in to the Emergency Department and triage nurse.  Should you have questions after your visit or need to cancel or reschedule your appointment, please contact [facility]  [phone] and follow the prompts.  Office hours are 8:00 a.m. to 4:30 p.m. Monday - Friday. Please note that voicemails left after 4:00 p.m. may not be returned until the following business day.  We are closed weekends and major holidays. You  have access to a nurse at all times for urgent questions. Please call the main number to the clinic [phone] and follow the prompts.  For any non-urgent questions, you may also contact your provider using MyChart. We now offer e-Visits for anyone 89 and older to request care online for non-urgent symptoms. For details visit [email or web address].   Also download the MyChart app! Go to the app store, search "MyChart", open the app, select [facility], and log in with your MyChart username and password.  Due to Covid, a mask is required upon entering the hospital/clinic. If you do not have a mask, one will be given to you upon arrival. For doctor visits, patients may have 1 support person aged 56 or older with them. For treatment visits, patients cannot have anyone with them due to current Covid guidelines and our immunocompromised population.

## 2021-07-16 NOTE — Progress Notes (Signed)
[facility]  [phone] [phone]  ID: [name] OB: 1955-12-24  MR#: [id number]  CSN#:[id number]  Patient Care Team: [place] Pink, MD as PCP - General (Family Medicine) Lloyd Huger, MD as Consulting Physician (Oncology)  CHIEF COMPLAINT: Pancytopenia, iron deficiency anemia.  Left renal mass.  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 Grayland Ormond for pancytopenia.  Reports doing well since her last visit with [place].  States over the past 2 to 3 months she has felt tired and has been eating a lot of ice.  Reports some easy bruising but no bleeding.  REVIEW OF SYSTEMS:   Review of Systems  Constitutional:  Positive for malaise/fatigue. Negative for chills, fever and weight loss.  HENT:  Negative for congestion, ear pain and tinnitus.   Eyes: Negative.  Negative for blurred vision and double vision.  Respiratory: Negative.  Negative for cough, sputum production and shortness of breath.   Cardiovascular: Negative.  Negative for chest pain, palpitations and leg swelling.  Gastrointestinal: Negative.  Negative for abdominal pain, constipation, diarrhea, nausea and vomiting.  Genitourinary:  Negative for dysuria, frequency and urgency.  Musculoskeletal:  Negative for back pain and falls.  Skin: Negative.  Negative for rash.  Neurological: Negative.  Negative for weakness and headaches.  Endo/Heme/Allergies:  Bruises/bleeds easily.  Psychiatric/Behavioral: Negative.  Negative for depression. The patient is not nervous/anxious and does not have insomnia.    As per HPI. Otherwise, a complete review of systems is negative.  PAST MEDICAL HISTORY: Past Medical History:   Diagnosis Date   Abdominal hernia    Allergic genetic state    Aortic ejection murmur    Ascites    Chronic hepatitis C ([place])    Depression    Diabetes mellitus without complication (HCC)    Dysrhythmia    GERD (gastroesophageal reflux disease)    GIB (gastrointestinal bleeding)    Headache    Hepatic cirrhosis (HCC)    Hepatitis C    Hernia, abdominal    Hyperlipidemia    Hypertension    IDA (iron deficiency anemia)    Inflamed seborrheic keratosis    Interdigital neuroma of foot    Irregular heart rhythm    Liver disease    Multiple renal cysts    Palpitations    Pancytopenia (HCC)    Portal hypertension with esophageal varices (HCC)    Splenomegaly    UTI (urinary tract infection)     PAST SURGICAL HISTORY: Past Surgical History:  Procedure Laterality Date   ABDOMINAL HYSTERECTOMY     CHOLECYSTECTOMY     ESOPHAGOGASTRODUODENOSCOPY N/A 02/17/2021   Procedure: ESOPHAGOGASTRODUODENOSCOPY (EGD);  Surgeon: Toledo, Benay Pike, MD;  Location: [facility] ENDOSCOPY;  Service: Gastroenterology;  Laterality: N/A;   ESOPHAGOGASTRODUODENOSCOPY (EGD) WITH PROPOFOL N/A 11/23/2017   Procedure: ESOPHAGOGASTRODUODENOSCOPY (EGD) WITH PROPOFOL;  Surgeon: Toledo, Benay Pike, MD;  Location: [facility] ENDOSCOPY;  Service: Gastroenterology;  Laterality: N/A;   ESOPHAGOGASTRODUODENOSCOPY (EGD) WITH PROPOFOL N/A 10/23/2018   Procedure: ESOPHAGOGASTRODUODENOSCOPY (EGD) WITH PROPOFOL;  Surgeon: Toledo, Benay Pike, MD;  Location: [facility] ENDOSCOPY;  Service: Gastroenterology;  Laterality: N/A;   ESOPHAGOGASTRODUODENOSCOPY (EGD) WITH PROPOFOL N/A 10/10/2019   Procedure: ESOPHAGOGASTRODUODENOSCOPY (EGD) WITH PROPOFOL;  Surgeon: Toledo, Benay Pike, MD;  Location: [facility]COPY;  Service: Gastroenterology;  Laterality: N/A;  ESOPHAGOGASTRODUODENOSCOPY (EGD) WITH PROPOFOL N/A 11/14/2019   Procedure: ESOPHAGOGASTRODUODENOSCOPY (EGD) WITH PROPOFOL;  Surgeon: Toledo, Benay Pike, MD;  Location: [facility] ENDOSCOPY;  Service:  Gastroenterology;  Laterality: N/A;   right elbow surgery     WISDOM TOOTH EXTRACTION      FAMILY HISTORY: Family History  Problem Relation Age of Onset   Diabetes Mother    Heart attack Mother    Hypertension Father    Thyroid disease Sister    Sickle cell anemia Sister    Breast cancer Neg Hx     ADVANCED DIRECTIVES (Y/N):  N  HEALTH MAINTENANCE: Social History   Tobacco Use   Smoking status: Never   Smokeless tobacco: Never  Vaping Use   Vaping Use: Never used  Substance Use Topics   Alcohol use: Not Currently   Drug use: Yes    Types: Marijuana     Colonoscopy:  PAP:  Bone density:  Lipid panel:  Allergies  Allergen Reactions   Penicillins Anaphylaxis   Amlodipine Besylate Itching   Metronidazole Other (See Comments)    Patient states that she felt "foggy" and "loopy" after taking medication.   Diphenhydramine-Acetaminophen Other (See Comments)    Made my breast hurt.   Enalapril    Furosemide    Sulfa Antibiotics     Reaction unknown.   Tylenol [Acetaminophen] Other (See Comments)    Breast pain   Codeine Itching   Latex Rash    Current Outpatient Medications  Medication Sig Dispense Refill   bumetanide (BUMEX) 1 MG tablet Take 1 mg by mouth daily.     enalapril (VASOTEC) 2.5 MG tablet Take by mouth.     lactulose (CHRONULAC) 10 GM/15ML solution Take 45 mLs (30 g total) by mouth 2 (two) times daily. 240 mL 0   metFORMIN (GLUCOPHAGE) 1000 MG tablet Take 1,000 mg by mouth 2 (two) times daily.     nadolol (CORGARD) 20 MG tablet Take 20 mg by mouth daily.     omeprazole (PRILOSEC) 20 MG capsule      rifaximin (XIFAXAN) 550 MG TABS tablet Take by mouth.     spironolactone (ALDACTONE) 100 MG tablet Take 100 mg by mouth daily.     traZODone (DESYREL) 50 MG tablet Take 50 mg by mouth at bedtime.     No current facility-administered medications for this visit.    OBJECTIVE: Vitals:   07/16/21 1126  BP: (!) 143/74  Resp: 20  Temp: 97.8 F (36.6  C)  SpO2: 100%     Body mass index is 30.5 kg/m.    ECOG FS:0 - Asymptomatic  Physical Exam Constitutional:      Appearance: Normal appearance.  HENT:     Head: Normocephalic and atraumatic.  Eyes:     Pupils: Pupils are equal, round, and reactive to light.  Cardiovascular:     Rate and Rhythm: Normal rate and regular rhythm.     Heart sounds: Normal heart sounds. No murmur heard. Pulmonary:     Effort: Pulmonary effort is normal.     Breath sounds: Normal breath sounds. No wheezing.  Abdominal:     General: Bowel sounds are normal. There is no distension.     Palpations: Abdomen is soft.     Tenderness: There is no abdominal tenderness.  Musculoskeletal:        General: Normal range of motion.     Cervical back: Normal range of motion.  Skin:    General: Skin is warm and dry.  Findings: No rash.  Neurological:     Mental Status: She is alert and oriented to person, place, and time.  Psychiatric:        Judgment: Judgment normal.    LAB RESULTS:  Lab Results  Component Value Date   NA 138 07/21/2018   K 4.1 07/21/2018   CL 112 (H) 07/21/2018   CO2 18 (L) 07/21/2018   GLUCOSE 111 (H) 07/21/2018   BUN 8 07/21/2018   CREATININE 0.66 07/21/2018   CALCIUM 8.6 (L) 07/21/2018   PROT 7.9 07/20/2018   ALBUMIN 3.6 07/20/2018   AST 48 (H) 07/20/2018   ALT 27 07/20/2018   ALKPHOS 91 07/20/2018   BILITOT 2.5 (H) 07/20/2018   GFRNONAA >60 07/21/2018   GFRAA >60 07/21/2018    Lab Results  Component Value Date   WBC 2.9 (L) 07/16/2021   NEUTROABS 2.0 07/16/2021   HGB 11.0 (L) 07/16/2021   HCT 32.1 (L) 07/16/2021   MCV 95.0 07/16/2021   PLT 80 (L) 07/16/2021   Lab Results  Component Value Date   IRON 92 01/08/2021   TIBC 398 01/08/2021   IRONPCTSAT 23 01/08/2021   Lab Results  Component Value Date   FERRITIN 97 01/08/2021     STUDIES: No results found.   ASSESSMENT: Pancytopenia, iron deficiency anemia.  Left renal mass.  PLAN:    1.   Pancytopenia:  She was initially diagnosed with pancytopenia back in 2018.  Etiology felt to be secondary to cirrhosis and splenomegaly.  Most recent MRI from 01/08/2021 showed stable splenomegaly.  Labs from 07/16/2021 show a hemoglobin of 11.0, WBC 2.9 and platelet count of 80,000.  ANC is 200.  Previous work-up did include an antineutrophil antibody as well as platelet antibodies which were both clinically insignificant given the stability of the rest of her labs.  No intervention is needed at this time.  2.  Cirrhosis:  She is followed closely by GI Dr. Alice Reichert and by Dr[facility]uncey [facility] at [facility].  Meld score of 9.  AFP has been normal.  Last ultrasound from 03/12/2020 showed cirrhosis with portal hypertension, multiple cystic lesions that are stable.  3.  GI bleed: She had an EGD on 02/17/2021 which showed grade 1 esophageal varices, portal hypertensive gastropathy with a normal duodenum.  4.  Iron deficiency anemia: Lab work from 07/16/2021 showed iron saturation 23%, ferritin of 59 and hemoglobin of 11.  She has ice pica.  She is unable to tolerate oral iron due to GI upset.  Proceed with 1 dose of IV Venofer today.   5.  Left renal mass:  MRI from 01/08/2021 showed stable 7 mm enhancing left renal lesion.  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  Recommend follow-up in 1 year.  6.  Easy bruising: Secondary to low platelet count.  Continue to monitor.  Disposition- 1 dose of IV Venofer today.  Return to clinic in 6 months after MRI of abdomen for follow-up for left renal mass to discuss results with Dr. Grayland Ormond.  She will also need labs prior.  I spent 20 minutes dedicated to the care of this patient (face-to-face and non-face-to-face) on the date of the encounter to include what is described in the assessment and plan.  Patient expressed understanding and was in agreement with this plan. She also understands that She can call clinic at any time with any questions, concerns, or  complaints.    Jacquelin Hawking, NP   07/16/2021 11:53 AM

## 2021-07-19 ENCOUNTER — Encounter: Payer: Self-pay | Admitting: Oncology

## 2021-11-25 ENCOUNTER — Telehealth: Payer: Self-pay | Admitting: *Deleted

## 2021-11-25 NOTE — Telephone Encounter (Signed)
Patient called stating that her [facility] physician told her to contact Dr Grayland Ormond to get a CT ordered for her abn kidney on previous scan. I do see that she is scheduled for an MRI in March. Please advise

## 2021-11-26 ENCOUNTER — Encounter: Payer: Self-pay | Admitting: Oncology

## 2021-11-26 NOTE — Telephone Encounter (Signed)
Called and LMTCB letting patient know that Dr.Finnegan is not in today and we will send him the message and get back with her when we have an update for her.

## 2021-11-26 NOTE — Telephone Encounter (Signed)
Pt returned call. Informed to keep MRI as scheduled and currently do not need CT scan as MRI will suffice. Pt verbalized understanding of keeping imaging and follow up appts as scheduled.

## 2022-01-10 ENCOUNTER — Other Ambulatory Visit: Payer: Self-pay | Admitting: *Deleted

## 2022-01-10 DIAGNOSIS — D509 Iron deficiency anemia, unspecified: Secondary | ICD-10-CM

## 2022-01-13 ENCOUNTER — Ambulatory Visit
Admission: RE | Admit: 2022-01-13 | Discharge: 2022-01-13 | Disposition: A | Discharge status: Home / Self Care | Payer: Medicare Other | Source: Ambulatory Visit | Attending: Oncology | Admitting: Oncology

## 2022-01-13 DIAGNOSIS — N2889 Other specified disorders of kidney and ureter: Secondary | ICD-10-CM | POA: Diagnosis not present

## 2022-01-13 IMAGING — MR MR ABDOMEN WO/W CM
22 series · 48 of 48 positions shown · IV contrast (7.5ml Gadavist)
Comparison: Abdominal MRI [DATE] and [DATE].

CLINICAL DATA: Follow up renal mass. History of renal insufficiency
and cirrhosis.

EXAM:
MRI ABDOMEN WITHOUT AND WITH CONTRAST
TECHNIQUE: Multiplanar multisequence MR imaging of the abdomen was performed
both before and after the administration of intravenous contrast.
CONTRAST:  7.5mL GADAVIST GADOBUTROL 1 MMOL/ML IV SOLN

[Series 3: T2 · coronal · 6.0mm · 1.19mm/px · 1 of 30 slices shown (1 of 2)]
[im 1/30]
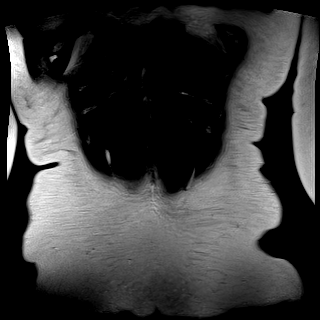

[Series 4: T2 · axial · 6.0mm · 1.19mm/px · 1 of 32 slices shown (2 of 2)]
[im 1/32]
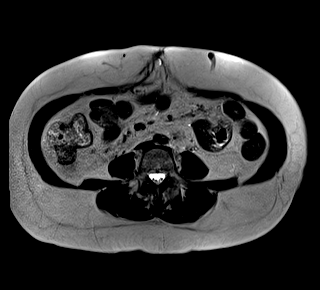

[Series 6: T2 fat-sat · axial · 6.0mm · 1.19mm/px · 1 of 34 slices shown]
[im 1/34]
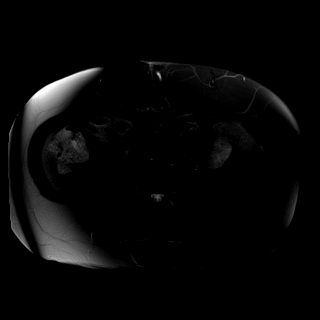

[Series 7: ax dwi_tracew · axial · 6.0mm · 1.42mm/px · z∈[-120,+118]mm · 2 of 102 slices shown]
[im 1/102]
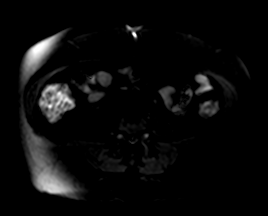
[im 102/102]
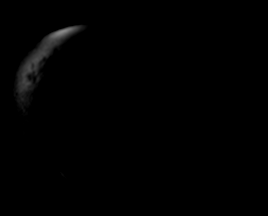

[Series 8: ax dwi_adc · axial · 6.0mm · 1.42mm/px · 1 of 34 slices shown]
[im 1/34]
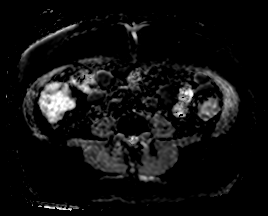

[Series 9: in & out · axial · 3.0mm · 1.19mm/px · 1 of 72 slices shown (1 of 6)]
[im 1/72]
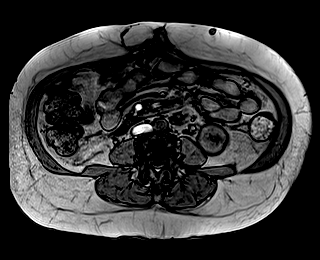

[Series 10: in & out · axial · 3.0mm · 1.19mm/px · 1 of 72 slices shown (2 of 6)]
[im 1/72]
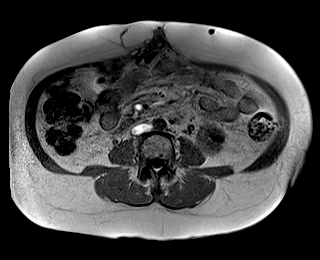

[Series 11: bSSFP · axial · 6.0mm · 0.74mm/px · 1 of 32 slices shown]
[im 1/32]
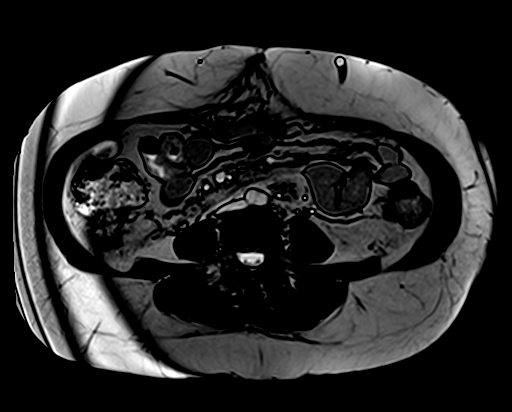

[Series 12: in & out · axial · 3.0mm · 1.19mm/px · z∈[-119,+118]mm · 2 of 80 slices shown (3 of 6)]
[im 1/80]
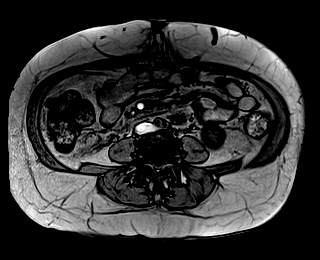
[im 80/80]
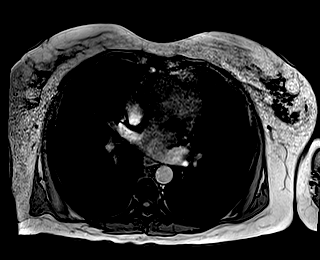

[Series 13: in & out · axial · 3.0mm · 1.19mm/px · z∈[-119,+118]mm · 2 of 80 slices shown (4 of 6)]
[im 1/80]
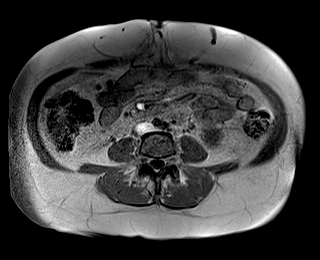
[im 80/80]
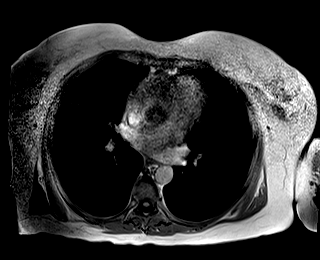

[Series 14: T1 dynamic fat-sat · axial · non-contrast · 3.0mm · 1.19mm/px · z∈[-119,+118]mm · 3 of 80 slices shown (1 of 5)]
[im 1/80]
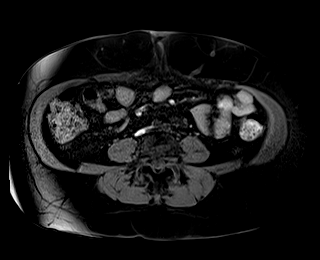
[im 40/80]
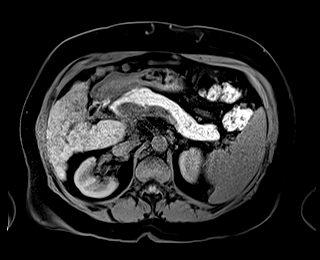
[im 80/80]
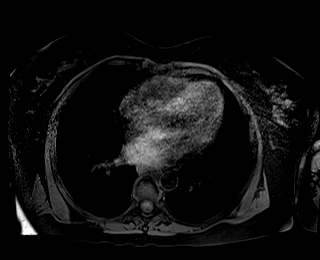

[Series 15: in & out · axial · 3.0mm · 1.19mm/px · z∈[-129,+108]mm · 3 of 80 slices shown (5 of 6)]
[im 1/80]
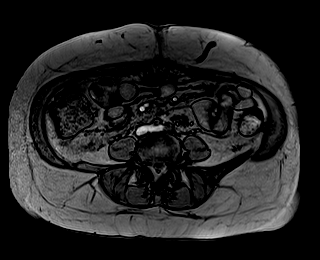
[im 40/80]
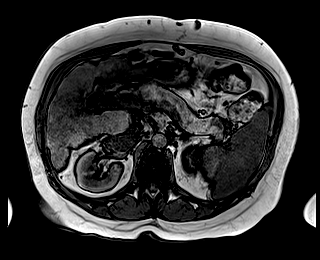
[im 80/80]
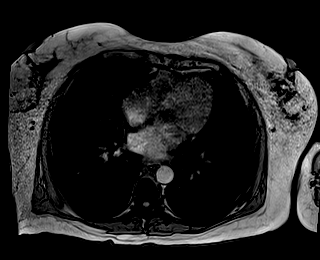

[Series 16: in & out · axial · 3.0mm · 1.19mm/px · z∈[-129,+108]mm · 3 of 80 slices shown (6 of 6)]
[im 1/80]
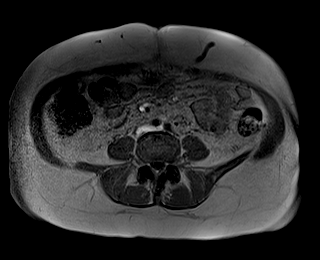
[im 40/80]
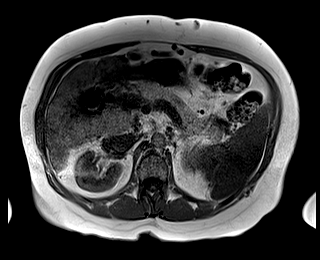
[im 80/80]
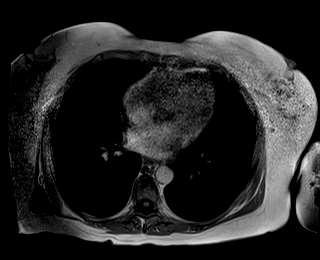

[Series 17: T1 dynamic fat-sat post-contrast · axial · 3.0mm · 1.19mm/px · z∈[-119,+118]mm · 3 of 80 slices shown (1 of 4)]
[im 1/80]
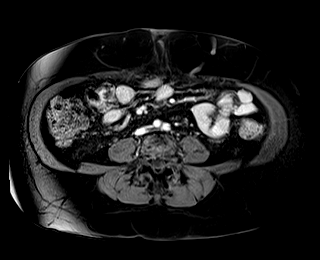
[im 40/80]
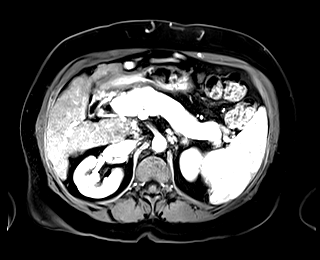
[im 80/80]
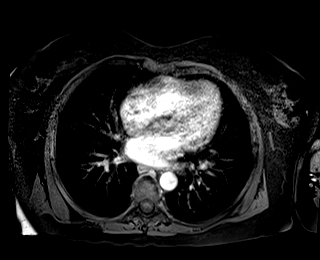

[Series 18: T1 dynamic fat-sat · axial · 3.0mm · 1.19mm/px · z∈[-119,+118]mm · 3 of 80 slices shown (2 of 5)]
[im 1/80]
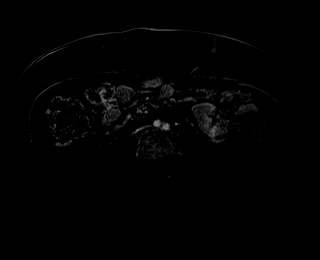
[im 40/80]
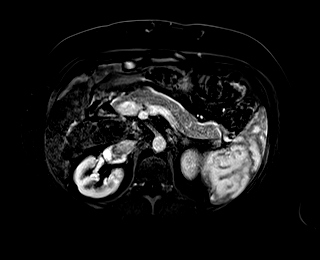
[im 80/80]
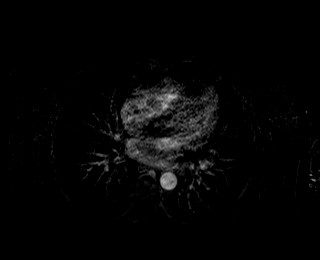

[Series 19: T1 dynamic fat-sat post-contrast · axial · 3.0mm · 1.19mm/px · z∈[-119,+118]mm · 3 of 80 slices shown (2 of 4)]
[im 1/80]
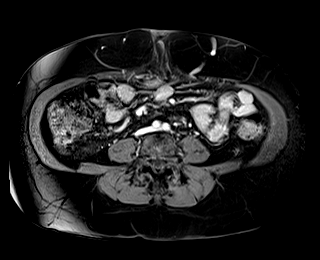
[im 40/80]
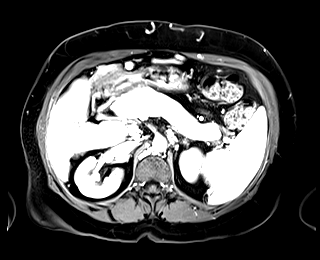
[im 80/80]
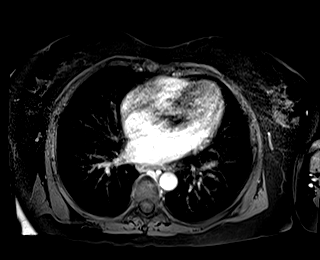

[Series 20: T1 dynamic fat-sat · axial · 3.0mm · 1.19mm/px · z∈[-119,+118]mm · 3 of 80 slices shown (3 of 5)]
[im 1/80]
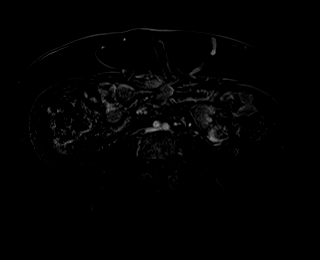
[im 40/80]
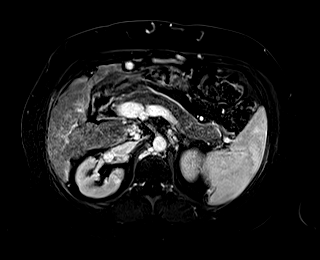
[im 80/80]
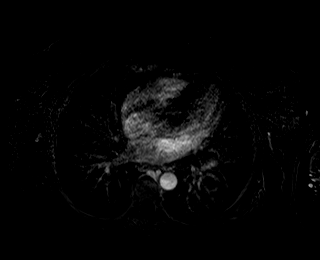

[Series 21: T1 dynamic fat-sat post-contrast · axial · 3.0mm · 1.19mm/px · z∈[-119,+118]mm · 3 of 80 slices shown (3 of 4)]
[im 1/80]
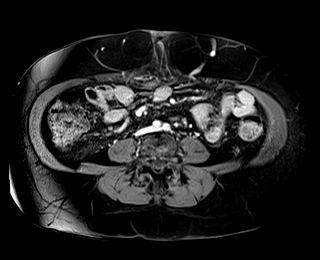
[im 40/80]
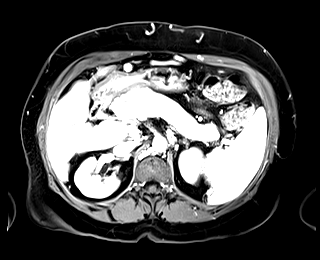
[im 80/80]
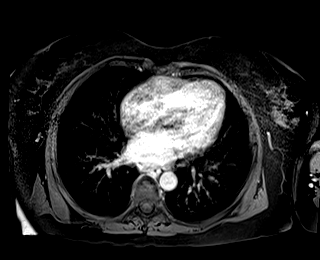

[Series 22: T1 dynamic fat-sat · axial · 3.0mm · 1.19mm/px · z∈[-119,+118]mm · 3 of 80 slices shown (4 of 5)]
[im 1/80]
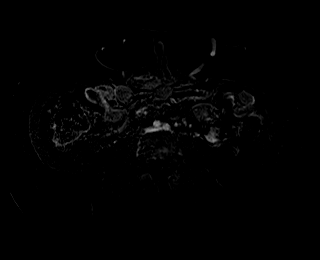
[im 40/80]
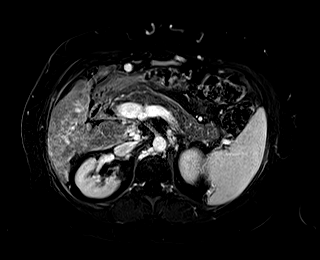
[im 80/80]
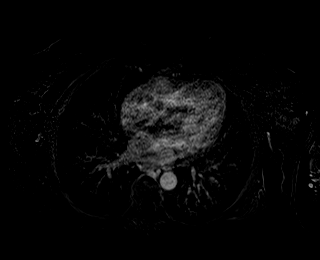

[Series 23: T1 dynamic post-contrast · coronal · 3.0mm · 1.31mm/px · 2 of 72 slices shown]
[im 1/72]
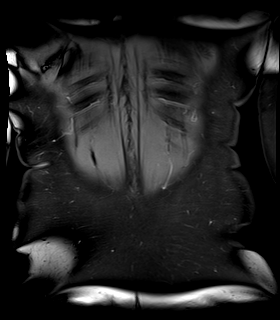
[im 72/72]
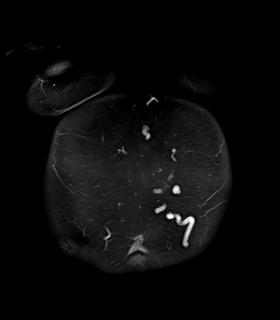

[Series 24: T1 dynamic fat-sat post-contrast · axial · 3.0mm · 1.19mm/px · z∈[-119,+118]mm · 3 of 80 slices shown (4 of 4)]
[im 1/80]
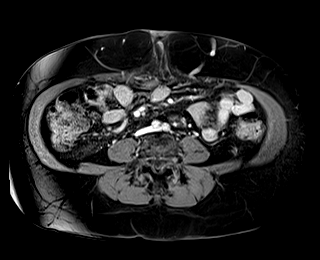
[im 40/80]
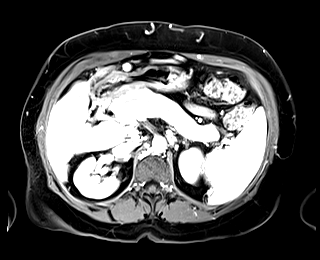
[im 80/80]
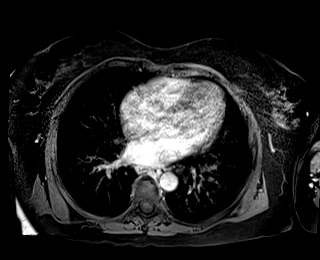

[Series 25: T1 dynamic fat-sat · axial · 3.0mm · 1.19mm/px · z∈[-119,+118]mm · 3 of 80 slices shown (5 of 5)]
[im 1/80]
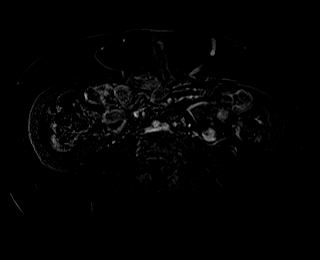
[im 40/80]
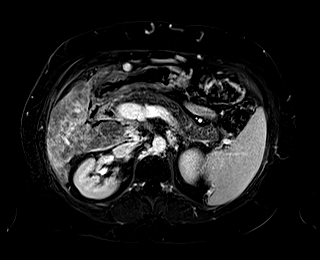
[im 80/80]
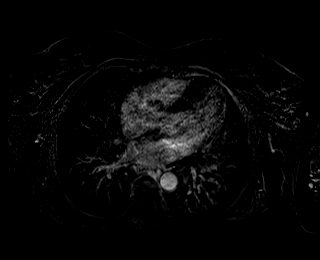

[48 of 48 positions shown; findings below may reference images not displayed]

FINDINGS: Lower chest: No acute findings are seen within the visualized lower
chest. There are multiple distal esophageal varices. Endplate
degenerative changes are present within the thoracic spine.

Hepatobiliary: Again demonstrated are changes of advanced hepatic
cirrhosis with areas of extensive confluent fibrosis and
regenerating nodules. Scattered hepatic cysts are unchanged. No
arterial phase enhancing or otherwise suspicious hepatic lesions are
identified. Dilated portal vein, recanalized left paraumbilical vein
and multiple portal venous collaterals and paraesophageal varices
are noted. No evidence of biliary dilatation post cholecystectomy.

Pancreas: Unremarkable. No pancreatic ductal dilatation or
surrounding inflammatory changes.

Spleen: Stable mild splenomegaly.  No focal abnormality.

Adrenals/Urinary Tract: Both adrenal glands appear normal. The small
enhancing lesion projecting posteriorly from the mid left kidney is
not significantly changed, measuring 8 mm on image 64/17. This
demonstrates intermediate T2 signal and homogeneous enhancement
following contrast. There is a stable small cyst in the lower pole
of the left kidney. No new or enlarging renal masses are identified.
No hydronephrosis.

Stomach/Bowel: The stomach appears unremarkable for its degree of
distension. No evidence of bowel wall thickening, distention or
surrounding inflammatory change.Moderate stool throughout the colon.

Vascular/Lymphatic: Stable mildly prominent reactive lymph nodes in
the porta hepatis. As above, multiple portal venous collaterals and
paraesophageal varices are again noted. No acute vascular findings.

Other: No ascites. Periumbilical hernia is incompletely visualized,
although grossly stable.

Musculoskeletal: No acute or significant osseous findings.
IMPRESSION: 1. Stable small enhancing lesion posteriorly in the mid left kidney,
again most likely representing an indolent papillary renal cell
carcinoma. No enlarging renal lesions or evidence of metastatic
disease.
2. Grossly stable changes of advanced hepatic cirrhosis with
confluent fibrosis and portal hypertension. No focally suspicious
lesions are identified to suggest hepatocellular carcinoma. Routine
continued surveillance of the liver should be considered.
3. No acute findings.

## 2022-01-13 MED ORDER — GADOBUTROL 1 MMOL/ML IV SOLN
7.5000 mL | Freq: Once | INTRAVENOUS | Status: AC | PRN
  Administered 2022-01-13: 7.5 mL via INTRAVENOUS

## 2022-01-18 ENCOUNTER — Inpatient Hospital Stay: Payer: Medicare Other | Attending: Oncology

## 2022-01-18 ENCOUNTER — Inpatient Hospital Stay: Payer: Medicare Other

## 2022-01-18 ENCOUNTER — Inpatient Hospital Stay (HOSPITAL_BASED_OUTPATIENT_CLINIC_OR_DEPARTMENT_OTHER): Payer: Medicare Other | Admitting: Oncology

## 2022-01-18 ENCOUNTER — Other Ambulatory Visit: Payer: Self-pay

## 2022-01-18 VITALS — BP 124/68 | HR 73

## 2022-01-18 VITALS — BP 131/77 | HR 78 | Temp 97.8°F | Resp 18 | Wt 181.5 lb

## 2022-01-18 DIAGNOSIS — D61818 Other pancytopenia: Secondary | ICD-10-CM | POA: Insufficient documentation

## 2022-01-18 DIAGNOSIS — D509 Iron deficiency anemia, unspecified: Secondary | ICD-10-CM

## 2022-01-18 LAB — CBC WITH DIFFERENTIAL/PLATELET
Abs Immature Granulocytes: 0.01 10*3/uL (ref 0.00–0.07)
Basophils Absolute: 0 10*3/uL (ref 0.0–0.1)
Basophils Relative: 0 %
Eosinophils Absolute: 0.1 10*3/uL (ref 0.0–0.5)
Eosinophils Relative: 2 %
HCT: 32.4 % — ABNORMAL LOW (ref 36.0–46.0)
Hemoglobin: 11 g/dL — ABNORMAL LOW (ref 12.0–15.0)
Immature Granulocytes: 0 %
Lymphocytes Relative: 18 %
Lymphs Abs: 0.6 10*3/uL — ABNORMAL LOW (ref 0.7–4.0)
MCH: 31.3 pg (ref 26.0–34.0)
MCHC: 34 g/dL (ref 30.0–36.0)
MCV: 92.3 fL (ref 80.0–100.0)
Monocytes Absolute: 0.3 10*3/uL (ref 0.1–1.0)
Monocytes Relative: 9 %
Neutro Abs: 2.3 10*3/uL (ref 1.7–7.7)
Neutrophils Relative %: 71 %
Platelets: 83 10*3/uL — ABNORMAL LOW (ref 150–400)
RBC: 3.51 MIL/uL — ABNORMAL LOW (ref 3.87–5.11)
RDW: 14.6 % (ref 11.5–15.5)
WBC: 3.3 10*3/uL — ABNORMAL LOW (ref 4.0–10.5)
nRBC: 0 % (ref 0.0–0.2)

## 2022-01-18 LAB — IRON AND TIBC
Iron: 107 ug/dL (ref 28–170)
Saturation Ratios: 27 % (ref 10.4–31.8)
TIBC: 392 ug/dL (ref 250–450)
UIBC: 285 ug/dL

## 2022-01-18 LAB — FERRITIN: Ferritin: 66 ng/mL (ref 11–307)

## 2022-01-18 MED ORDER — IRON SUCROSE 20 MG/ML IV SOLN
200.0000 mg | Freq: Once | INTRAVENOUS | Status: AC
  Administered 2022-01-18: 200 mg via INTRAVENOUS
  Filled 2022-01-18: qty 10

## 2022-01-18 MED ORDER — SODIUM CHLORIDE 0.9 % IV SOLN
Freq: Once | INTRAVENOUS | Status: AC
  Filled 2022-01-18: qty 250

## 2022-01-18 MED ORDER — SODIUM CHLORIDE 0.9 % IV SOLN: 200.0000 mg | Freq: Once | INTRAVENOUS | Status: DC

## 2022-01-18 NOTE — Progress Notes (Signed)
?[place]  ?[phone]) [phone] [phone] ? ?ID: [name] OB: 03-Dec-1955  MR#: [id number]  RRN#:[id number] ? ?Patient Care Team: ?Maryland Pink, MD as PCP - General (Family Medicine) ?Lloyd Huger, MD as Consulting Physician (Oncology) ? ?CHIEF COMPLAINT: Pancytopenia, iron deficiency anemia.  Left renal mass. ? ?INTERVAL HISTORY: Patient returns to clinic today for repeat laboratory work, further evaluation, and discussion of her imaging results.  She noticed increased weakness and fatigue recently, but otherwise has felt well.  She has no neurologic complaints.  She denies any recent fevers or illnesses.  She has a good appetite and denies weight loss.  She denies any chest pain, cough, hemoptysis, or shortness of breath.  She denies any abdominal pain. She denies any nausea, vomiting, constipation, or diarrhea.  She has no melena or hematochezia. She has no urinary complaints.  Patient offers no further specific complaints today. ? ?REVIEW OF SYSTEMS:   ?Review of Systems  ?Constitutional:  Positive for malaise/fatigue. Negative for fever and weight loss.  ?Respiratory: Negative.  Negative for cough, hemoptysis and shortness of breath.   ?Cardiovascular: Negative.  Negative for chest pain and leg swelling.  ?Gastrointestinal: Negative.  Negative for abdominal pain, blood in stool and melena.  ?Genitourinary: Negative.  Negative for dysuria and hematuria.  ?Musculoskeletal: Negative.  Negative for back pain.  ?Skin: Negative.  Negative for rash.  ?Neurological:  Positive for weakness. Negative for dizziness, sensory change, focal weakness and headaches.  ?Psychiatric/Behavioral: Negative.  The patient is not nervous/anxious.   ? ?As per HPI. Otherwise, a complete review of systems is negative. ? ?PAST MEDICAL HISTORY: ?Past Medical History:  ?Diagnosis Date  ? Abdominal hernia   ? Allergic genetic state   ? Aortic ejection murmur   ? Ascites   ? Chronic  hepatitis C ([place])   ? Depression   ? Diabetes mellitus without complication ([place])   ? Dysrhythmia   ? GERD (gastroesophageal reflux disease)   ? GIB (gastrointestinal bleeding)   ? Headache   ? Hepatic cirrhosis ([place])   ? Hepatitis C   ? Hernia, abdominal   ? Hyperlipidemia   ? Hypertension   ? IDA (iron deficiency anemia)   ? Inflamed seborrheic keratosis   ? Interdigital neuroma of foot   ? Irregular heart rhythm   ? Liver disease   ? Multiple renal cysts   ? Palpitations   ? Pancytopenia ([place])   ? Portal hypertension with esophageal varices (H[facility])   ? Splenomegaly   ? UTI (urinary tract infection)   ? ? ?PAST SURGICAL HISTORY: ?Past Surgical History:  ?Procedure Laterality Date  ? ABDOMINAL HYSTERECTOMY    ? CHOLECYSTECTOMY    ? ESOPHAGOGASTRODUODENOSCOPY N/A 02/17/2021  ? Procedure: ESOPHAGOGASTRODUODENOSCOPY (EGD);  Surgeon: Toledo, Benay Pike, MD;  Location: [facility] ENDOSCOPY;  Service: Gastroenterology;  Laterality: N/A;  ? ESOPHAGOGASTRODUODENOSCOPY (EGD) WITH PROPOFOL N/A 11/23/2017  ? Procedure: ESOPHAGOGASTRODUODENOSCOPY (EGD) WITH PROPOFOL;  Surgeon: Toledo, Benay Pike, MD;  Location: [facility];  Service: Gastroenterology;  Laterality: N/A;  ? ESOPHAGOGASTRODUODENOSCOPY (EGD) WITH PROPOFOL N/A 10/23/2018  ? Procedure: ESOPHAGOGASTRODUODENOSCOPY (EGD) WITH PROPOFOL;  Surgeon: Toledo, Benay Pike, MD;  Location: [facility];  Service: Gastroenterology;  Laterality: N/A;  ? ESOPHAGOGASTRODUODENOSCOPY (EGD) WITH PROPOFOL N/A 10/10/2019  ? Procedure: ESOPHAGOGASTRODUODENOSCOPY (EGD) WITH PROPOFOL;  Surgeon: Toledo, Benay Pike, MD;  Location: [facility];  Service: Gastroenterology;  Laterality: N/A;  ? ESOPHAGOGASTRODUODENOSCOPY (EGD) WITH PROPOFOL N/A 11/14/2019  ? Procedure: ESOPHAGOGASTRODUODENOSCOPY (EGD) WITH PROPOFOL;  Surgeon: [place], [place],  MD;  Location: [facility];  Service: Gastroenterology;  Laterality: N/A;  ? right elbow surgery    ? WISDOM TOOTH EXTRACTION    ? ? ?FAMILY HISTORY: ?Family  History  ?Problem Relation Age of Onset  ? Diabetes Mother   ? Heart attack Mother   ? Hypertension Father   ? Thyroid disease Sister   ? Sickle cell anemia Sister   ? Breast cancer Neg Hx   ? ? ?ADVANCED DIRECTIVES (Y/N):  N ? ?HEALTH MAINTENANCE: ?Social History  ? ?Tobacco Use  ? Smoking status: Never  ? Smokeless tobacco: Never  ?Vaping Use  ? Vaping Use: Never used  ?Substance Use Topics  ? Alcohol use: Not Currently  ? Drug use: Yes  ?  Types: Marijuana  ? ? ? Colonoscopy: ? PAP: ? Bone density: ? Lipid panel: ? ?Allergies  ?Allergen Reactions  ? Penicillins Anaphylaxis  ? Amlodipine Besylate Itching  ? Metronidazole Other (See Comments)  ?  Patient states that she felt "foggy" and "loopy" after taking medication.  ? Diphenhydramine-Acetaminophen Other (See Comments)  ?  Made my breast hurt.  ? Enalapril   ? Furosemide   ? Sulfa Antibiotics   ?  Reaction unknown.  ? Tylenol [Acetaminophen] Other (See Comments)  ?  Breast pain  ? Codeine Itching  ? Latex Rash  ? ? ?Current Outpatient Medications  ?Medication Sig Dispense Refill  ? bumetanide (BUMEX) 1 MG tablet Take 1 mg by mouth daily.    ? carvedilol (COREG) 3.125 MG tablet Take 3.125 mg by mouth 2 (two) times daily with a meal.    ? enalapril (VASOTEC) 2.5 MG tablet Take by mouth.    ? lactulose (CHRONULAC) 10 GM/15ML solution Take 45 mLs (30 g total) by mouth 2 (two) times daily. 240 mL 0  ? metFORMIN (GLUCOPHAGE) 1000 MG tablet Take 1,000 mg by mouth 2 (two) times daily.    ? omeprazole (PRILOSEC) 20 MG capsule     ? rifaximin (XIFAXAN) 550 MG TABS tablet Take by mouth.    ? spironolactone (ALDACTONE) 100 MG tablet Take 100 mg by mouth daily.    ? traZODone (DESYREL) 50 MG tablet Take 50 mg by mouth at bedtime.    ? ?No current facility-administered medications for this visit.  ? ? ?OBJECTIVE: ?Vitals:  ? 01/18/22 1248  ?BP: 131/77  ?Pulse: 78  ?Resp: 18  ?Temp: 97.8 ?F (36.6 ?C)  ?SpO2: 97%  ?   Body mass index is 31.15 kg/m?Marland Kitchen    ECOG FS:0 -  Asymptomatic ? ?General: Well-developed, well-nourished, no acute distress. ?Eyes: Pink conjunctiva, anicteric sclera. ?HEENT: Normocephalic, moist mucous membranes. ?Lungs: No audible wheezing or coughing. ?Heart: Regular rate and rhythm. ?Abdomen: Soft, nontender, no obvious distention. ?Musculoskeletal: No edema, cyanosis, or clubbing. ?Neuro: Alert, answering all questions appropriately. Cranial nerves grossly intact. ?Skin: No rashes or petechiae noted. ?Psych: Normal affect. ? ?LAB RESULTS: ? ?Lab Results  ?Component Value Date  ? NA 138 07/21/2018  ? K 4.1 07/21/2018  ? CL 112 (H) 07/21/2018  ? CO2 18 (L) 07/21/2018  ? GLUCOSE 111 (H) 07/21/2018  ? BUN 8 07/21/2018  ? CREATININE 0.66 07/21/2018  ? CALCIUM 8.6 (L) 07/21/2018  ? PROT 7.9 07/20/2018  ? ALBUMIN 3.6 07/20/2018  ? AST 48 (H) 07/20/2018  ? ALT 27 07/20/2018  ? ALKPHOS 91 07/20/2018  ? BILITOT 2.5 (H) 07/20/2018  ? GFRNONAA >60 07/21/2018  ? GFRAA >60 07/21/2018  ? ? ?Lab Results  ?Component Value Date  ?  WBC 3.3 (L) 01/18/2022  ? NEUTROABS 2.3 01/18/2022  ? HGB 11.0 (L) 01/18/2022  ? HCT 32.4 (L) 01/18/2022  ? MCV 92.3 01/18/2022  ? PLT 83 (L) 01/18/2022  ? ?Lab Results  ?Component Value Date  ? IRON 107 01/18/2022  ? TIBC 392 01/18/2022  ? IRONPCTSAT 27 01/18/2022  ? ?Lab Results  ?Component Value Date  ? FERRITIN 66 01/18/2022  ? ? ? ?STUDIES: ?MR Abdomen W Wo Contrast ? ?Result Date: 01/15/2022 ?CLINICAL DATA:  Follow up renal mass. History of renal insufficiency and cirrhosis. EXAM: MRI ABDOMEN WITHOUT AND WITH CONTRAST TECHNIQUE: Multiplanar multisequence MR imaging of the abdomen was performed both before and after the administration of intravenous contrast. CONTRAST:  7.41m GADAVIST GADOBUTROL 1 MMOL/ML IV SOLN COMPARISON:  Abdominal MRI 01/08/2021 and 07/15/2020. FINDINGS: Lower chest: No acute findings are seen within the visualized lower chest. There are multiple distal esophageal varices. Endplate degenerative changes are present within  the thoracic spine. Hepatobiliary: Again demonstrated are changes of advanced hepatic cirrhosis with areas of extensive confluent fibrosis and regenerating nodules. Scattered hepatic cysts are unchanged. No arterial phase

## 2022-01-18 NOTE — Patient Instructions (Signed)

## 2022-01-20 ENCOUNTER — Encounter: Payer: Self-pay | Admitting: Oncology

## 2022-05-02 ENCOUNTER — Telehealth: Payer: Self-pay | Admitting: *Deleted

## 2022-05-02 NOTE — Telephone Encounter (Addendum)
Per Ander Purpura, OK to move appointments up sooner. Abbie, will you please reach out to patient to reschedule lab/NP/infusion appointments.

## 2022-05-02 NOTE — Telephone Encounter (Signed)
Patient called in requesting appointment for IV iron, reports labs were recently drawn and her were low and she reports fatigue. The most recent labs I see are from 6/1 with hgb 12.0, ferritin and iron were also checked. She last received iron on 12/29/21 and is scheduled for f/u on 07/01/22 (lab/see Lauren/possible iron). Can we move these appointments up? Please advise.

## 2022-05-02 NOTE — Telephone Encounter (Deleted)
Spoke to patient and scheduled to be seen in [facility] today.

## 2022-05-04 ENCOUNTER — Other Ambulatory Visit: Payer: Self-pay | Admitting: Oncology

## 2022-05-04 ENCOUNTER — Other Ambulatory Visit: Payer: Self-pay | Admitting: *Deleted

## 2022-05-05 ENCOUNTER — Inpatient Hospital Stay: Payer: Medicare Other

## 2022-05-05 ENCOUNTER — Other Ambulatory Visit: Payer: Self-pay

## 2022-05-05 ENCOUNTER — Inpatient Hospital Stay (HOSPITAL_BASED_OUTPATIENT_CLINIC_OR_DEPARTMENT_OTHER): Payer: Medicare Other | Admitting: Nurse Practitioner

## 2022-05-05 ENCOUNTER — Inpatient Hospital Stay: Payer: Medicare Other | Attending: Oncology

## 2022-05-05 ENCOUNTER — Encounter: Payer: Self-pay | Admitting: Nurse Practitioner

## 2022-05-05 VITALS — BP 121/80 | HR 69 | Temp 97.6°F | Resp 20 | Ht 64.0 in | Wt 188.9 lb

## 2022-05-05 DIAGNOSIS — K7460 Unspecified cirrhosis of liver: Secondary | ICD-10-CM | POA: Insufficient documentation

## 2022-05-05 DIAGNOSIS — Z90710 Acquired absence of both cervix and uterus: Secondary | ICD-10-CM | POA: Insufficient documentation

## 2022-05-05 DIAGNOSIS — N2889 Other specified disorders of kidney and ureter: Secondary | ICD-10-CM | POA: Diagnosis not present

## 2022-05-05 DIAGNOSIS — D61818 Other pancytopenia: Secondary | ICD-10-CM | POA: Diagnosis not present

## 2022-05-05 DIAGNOSIS — R161 Splenomegaly, not elsewhere classified: Secondary | ICD-10-CM | POA: Insufficient documentation

## 2022-05-05 DIAGNOSIS — D509 Iron deficiency anemia, unspecified: Secondary | ICD-10-CM

## 2022-05-05 DIAGNOSIS — E119 Type 2 diabetes mellitus without complications: Secondary | ICD-10-CM | POA: Diagnosis not present

## 2022-05-05 DIAGNOSIS — I10 Essential (primary) hypertension: Secondary | ICD-10-CM | POA: Insufficient documentation

## 2022-05-05 LAB — CBC WITH DIFFERENTIAL/PLATELET
Abs Immature Granulocytes: 0.01 10*3/uL (ref 0.00–0.07)
Basophils Absolute: 0 10*3/uL (ref 0.0–0.1)
Basophils Relative: 1 %
Eosinophils Absolute: 0.2 10*3/uL (ref 0.0–0.5)
Eosinophils Relative: 4 %
HCT: 32.7 % — ABNORMAL LOW (ref 36.0–46.0)
Hemoglobin: 11.3 g/dL — ABNORMAL LOW (ref 12.0–15.0)
Immature Granulocytes: 0 %
Lymphocytes Relative: 22 %
Lymphs Abs: 0.9 10*3/uL (ref 0.7–4.0)
MCH: 32 pg (ref 26.0–34.0)
MCHC: 34.6 g/dL (ref 30.0–36.0)
MCV: 92.6 fL (ref 80.0–100.0)
Monocytes Absolute: 0.5 10*3/uL (ref 0.1–1.0)
Monocytes Relative: 13 %
Neutro Abs: 2.3 10*3/uL (ref 1.7–7.7)
Neutrophils Relative %: 60 %
Platelets: 89 10*3/uL — ABNORMAL LOW (ref 150–400)
RBC: 3.53 MIL/uL — ABNORMAL LOW (ref 3.87–5.11)
RDW: 14.4 % (ref 11.5–15.5)
WBC: 3.9 10*3/uL — ABNORMAL LOW (ref 4.0–10.5)
nRBC: 0 % (ref 0.0–0.2)

## 2022-05-05 LAB — IRON AND TIBC
Iron: 171 ug/dL — ABNORMAL HIGH (ref 28–170)
Saturation Ratios: 47 % — ABNORMAL HIGH (ref 10.4–31.8)
TIBC: 367 ug/dL (ref 250–450)
UIBC: 196 ug/dL

## 2022-05-05 LAB — FERRITIN: Ferritin: 159 ng/mL (ref 11–307)

## 2022-05-05 MED FILL — Iron Sucrose Inj 20 MG/ML (Fe Equiv): INTRAVENOUS | Qty: 10 | Status: AC

## 2022-05-05 NOTE — Progress Notes (Signed)
[name] at [facility] Telephone:(336) [phone] Fax:(336) [phone]  ID: Roberta Lane OB: [birth year]  MR#: [id number]  KVT#:[id number]  Patient Care Team: Maryland Pink, MD as PCP - General (Family Medicine) Lloyd Huger, MD as Consulting Physician (Oncology)  CHIEF COMPLAINT: Pancytopenia, iron deficiency anemia.  Left renal mass.  INTERVAL HISTORY: Patient returns to clinic today for repeat laboratory work, further evaluation, and consideration of IV Iron. She has chronic fatigue which is unchanged. Short of breath with exertion. Has gained weight.  She has no neurologic complaints.  She denies any recent fevers or illnesses.  She has a good appetite and denies weight loss.  She denies any chest pain, cough, hemoptysis, or shortness of breath.  She denies any abdominal pain. She denies any nausea, vomiting, constipation, or diarrhea.  She has no melena or hematochezia. She has no urinary complaints.  Patient offers no further specific complaints today.  REVIEW OF SYSTEMS:   Review of Systems  Constitutional:  Positive for malaise/fatigue. Negative for fever and weight loss.  Respiratory: Negative.  Negative for cough, hemoptysis and shortness of breath.   Cardiovascular: Negative.  Negative for chest pain and leg swelling.  Gastrointestinal: Negative.  Negative for abdominal pain, blood in stool and melena.  Genitourinary: Negative.  Negative for dysuria and hematuria.  Musculoskeletal: Negative.  Negative for back pain.  Skin: Negative.  Negative for rash.  Neurological:  Positive for weakness. Negative for dizziness, sensory change, focal weakness and headaches.  Psychiatric/Behavioral: Negative.  The patient is not nervous/anxious.   As per HPI. Otherwise, a complete review of systems is negative.  PAST MEDICAL HISTORY: Past Medical History:  Diagnosis Date   Abdominal hernia    Allergic genetic state    Aortic ejection murmur    Ascites    Chronic  hepatitis C ([place])    Depression    Diabetes mellitus without complication (H[facility])    Dysrhythmia    GERD (gastroesophageal reflux disease)    GIB (gastrointestinal bleeding)    Headache    Hepatic cirrhosis (HCC)    Hepatitis C    Hernia, abdominal    Hyperlipidemia    Hypertension    IDA (iron deficiency anemia)    Inflamed seborrheic keratosis    Interdigital neuroma of foot    Irregular heart rhythm    Liver disease    Multiple renal cysts    Palpitations    Pancytopenia (HCC)    Portal hypertension with esophageal varices (HCC)    Splenomegaly    UTI (urinary tract infection)     PAST SURGICAL HISTORY: Past Surgical History:  Procedure Laterality Date   ABDOMINAL HYSTERECTOMY     CHOLECYSTECTOMY     ESOPHAGOGASTRODUODENOSCOPY N/A 02/17/2021   Procedure: ESOPHAGOGASTRODUODENOSCOPY (EGD);  Surgeon: Toledo, Benay Pike, MD;  Location: [facility];  Service: Gastroenterology;  Laterality: N/A;   ESOPHAGOGASTRODUODENOSCOPY (EGD) WITH PROPOFOL N/A 11/23/2017   Procedure: ESOPHAGOGASTRODUODENOSCOPY (EGD) WITH PROPOFOL;  Surgeon: Toledo, Benay Pike, MD;  Location: [facility];  Service: Gastroenterology;  Laterality: N/A;   ESOPHAGOGASTRODUODENOSCOPY (EGD) WITH PROPOFOL N/A 10/23/2018   Procedure: ESOPHAGOGASTRODUODENOSCOPY (EGD) WITH PROPOFOL;  Surgeon: Toledo, Benay Pike, MD;  Location: [facility];  Service: Gastroenterology;  Laterality: N/A;   ESOPHAGOGASTRODUODENOSCOPY (EGD) WITH PROPOFOL N/A 10/10/2019   Procedure: ESOPHAGOGASTRODUODENOSCOPY (EGD) WITH PROPOFOL;  Surgeon: Toledo, Benay Pike, MD;  Location: [facility];  Service: Gastroenterology;  Laterality: N/A;   ESOPHAGOGASTRODUODENOSCOPY (EGD) WITH PROPOFOL N/A 11/14/2019   Procedure: ESOPHAGOGASTRODUODENOSCOPY (EGD) WITH PROPOFOL;  Surgeon: [place], [place],  MD;  Location: [facility];  Service: Gastroenterology;  Laterality: N/A;   right elbow surgery     WISDOM TOOTH EXTRACTION      FAMILY HISTORY: Family  History  Problem Relation Age of Onset   Diabetes Mother    Heart attack Mother    Hypertension Father    Thyroid disease Sister    Sickle cell anemia Sister    Breast cancer Neg Hx     ADVANCED DIRECTIVES (Y/N):  N  HEALTH MAINTENANCE: Social History   Tobacco Use   Smoking status: Never   Smokeless tobacco: Never  Vaping Use   Vaping Use: Never used  Substance Use Topics   Alcohol use: Not Currently   Drug use: Yes    Types: Marijuana     Colonoscopy:  PAP:  Bone density:  Lipid panel:  Allergies  Allergen Reactions   Penicillins Anaphylaxis   Amlodipine Besylate Itching   Metronidazole Other (See Comments)    Patient states that she felt "foggy" and "loopy" after taking medication.   Diphenhydramine-Acetaminophen Other (See Comments)    Made my breast hurt.   Enalapril    Furosemide    Sulfa Antibiotics     Reaction unknown.   Tylenol [Acetaminophen] Other (See Comments)    Breast pain   Codeine Itching   Latex Rash    Current Outpatient Medications  Medication Sig Dispense Refill   bumetanide (BUMEX) 1 MG tablet Take 1 mg by mouth daily.     carvedilol (COREG) 3.125 MG tablet Take 3.125 mg by mouth 2 (two) times daily with a meal.     enalapril (VASOTEC) 2.5 MG tablet Take by mouth.     lactulose (CHRONULAC) 10 GM/15ML solution Take 45 mLs (30 g total) by mouth 2 (two) times daily. 240 mL 0   metFORMIN (GLUCOPHAGE) 1000 MG tablet Take 1,000 mg by mouth 2 (two) times daily.     omeprazole (PRILOSEC) 20 MG capsule      rifaximin (XIFAXAN) 550 MG TABS tablet Take by mouth.     spironolactone (ALDACTONE) 100 MG tablet Take 100 mg by mouth daily.     traZODone (DESYREL) 50 MG tablet Take 50 mg by mouth at bedtime.     No current facility-administered medications for this visit.    OBJECTIVE: Vitals:   05/05/22 1345  BP: 121/80  Pulse: 69     Body mass index is 32.42 kg/m.    ECOG FS:0 - Asymptomatic  General: Well-developed, well-nourished, no  acute distress. Eyes: Pink conjunctiva, anicteric sclera. HEENT: Normocephalic, moist mucous membranes. Lungs: No audible wheezing or coughing. Heart: Regular rate and rhythm. Abdomen: Soft, nontender, no obvious distention. Musculoskeletal: No edema, cyanosis, or clubbing. Neuro: Alert, answering all questions appropriately. Cranial nerves grossly intact. Skin: No rashes or petechiae noted. Psych: Normal affect.  LAB RESULTS:  Lab Results  Component Value Date   NA 138 07/21/2018   K 4.1 07/21/2018   CL 112 (H) 07/21/2018   CO2 18 (L) 07/21/2018   GLUCOSE 111 (H) 07/21/2018   BUN 8 07/21/2018   CREATININE 0.66 07/21/2018   CALCIUM 8.6 (L) 07/21/2018   PROT 7.9 07/20/2018   ALBUMIN 3.6 07/20/2018   AST 48 (H) 07/20/2018   ALT 27 07/20/2018   ALKPHOS 91 07/20/2018   BILITOT 2.5 (H) 07/20/2018   GFRNONAA >60 07/21/2018   GFRAA >60 07/21/2018    Lab Results  Component Value Date   WBC 3.9 (L) 05/05/2022   NEUTROABS 2.3 05/05/2022  HGB 11.3 (L) 05/05/2022   HCT 32.7 (L) 05/05/2022   MCV 92.6 05/05/2022   PLT 89 (L) 05/05/2022   Lab Results  Component Value Date   IRON 107 01/18/2022   TIBC 392 01/18/2022   IRONPCTSAT 27 01/18/2022   Lab Results  Component Value Date   FERRITIN 66 01/18/2022    STUDIES: No results found.   ASSESSMENT: Pancytopenia, iron deficiency anemia.  Left renal mass.  PLAN:    1.  Pancytopenia: Multifactorial including cirrhosis and splenomegaly.  Repeat MRI January 13, 2022 revealed stable splenomegaly of approximately 15 cm.  White count remains decreased, but relatively unchanged at 3.9. Platelets also chronically decreased at approximately her baseline at 89.  She was previously noted to have antineutrophil antibodies as well as platelet antibodies but both are likely clinically insignificant given the stability of her laboratory work.  No intervention is needed at this time.  Patient does not require bone marrow biopsy.  Return to  clinic in 6 months for routine evaluation.   2.  Cirrhosis: Patient's most recent AFP continues to be within normal limits at 2.5.  Continue follow-up with GI as indicated. She has imaging scheduled with [facility] for surveillance.  3.  GI bleed: Patient denies any recent bleeding.  Patient's most recent EGD was on February 17, 2021.   4.  Iron deficiency anemia: Hemoglobin remains decreased but stable, hmg 11.3. Iron stores are pending at time of visit but were checked 1 month ago with PCP and well replenished, ferritin was in 170s. No indication for IV iron at this time. Suspect symptoms are related to comorbidities and I recommend she discuss with ehr pcp.  5.  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  No intervention is needed.  Continue to monitor with yearly imaging.  Repeat MRI in March 2024. 6. Breast cancer screening- overdue for mammo. See pcp  Disposition: No iron today Rtc in 3 months for labs then day to week later, see Dr. Grayland Ormond, +/- iv iron/venofer- la  Patient expressed understanding and was in agreement with this plan. She also understands that She can call clinic at any time with any questions, concerns, or complaints.   Verlon Au, NP   05/05/2022

## 2022-05-05 NOTE — Progress Notes (Signed)
Patient c/o intermittent shortness of breath and fatigue. She craves ice. She is scheduled to see Dr. Loletha Grayer at [facility] on Monday to follow-up for cirrhosis of liver. She has a kidney lesion that Dr. Loletha Grayer is also following. She states that Dr. Loletha Grayer will be ordering a CT of her abdomen to r/u hepatocellular ca.

## 2022-05-25 ENCOUNTER — Telehealth: Payer: Self-pay | Admitting: *Deleted

## 2022-05-25 NOTE — Telephone Encounter (Signed)
Formatting of this note might be different from the original.  Patient called to question if she needs iron at this time, based on recent results. She reports she never heard back from labs drawn at last visit. She reports she continues to have weakness, fatigue, the corners of her mouth are cracking and she is craving ice. Please advise.   Electronically signed by Carlynn Herald, RN at 05/25/2022  4:26 PM EDT

## 2022-05-25 NOTE — Telephone Encounter (Signed)
Patient called to question if she needs iron at this time, based on recent results. She reports she never heard back from labs drawn at last visit. She reports she continues to have weakness, fatigue, the corners of her mouth are cracking and she is craving ice. Please advise.

## 2022-05-26 ENCOUNTER — Encounter: Payer: Self-pay | Admitting: Oncology

## 2022-07-08 ENCOUNTER — Emergency Department: Admit: 2022-07-08 | Payer: BLUE CROSS/BLUE SHIELD

## 2022-07-08 ENCOUNTER — Inpatient Hospital Stay
Admit: 2022-07-08 | Discharge: 2022-07-09 | Disposition: A | Discharge status: Home / Self Care | Payer: MEDICARE | Attending: Emergency Medicine

## 2022-07-08 ENCOUNTER — Emergency Department: Admit: 2022-07-09 | Payer: MEDICARE

## 2022-07-08 DIAGNOSIS — R0789 Other chest pain: Secondary | ICD-10-CM

## 2022-07-08 LAB — CBC WITH AUTO DIFFERENTIAL
Absolute Immature Granulocyte: <0.03 10*3/uL (ref 0.00–0.58)
Basophils %: 1 % (ref 0.0–2.0)
Basophils Absolute: 0.02 10*3/uL (ref 0.00–0.20)
Eosinophils %: 4 % (ref 0–6)
Eosinophils Absolute: 0.15 10*3/uL (ref 0.05–0.50)
Hematocrit: 33.2 % — ABNORMAL LOW (ref 34.0–48.0)
Hemoglobin: 11 g/dL — ABNORMAL LOW (ref 11.5–15.5)
Immature Granulocytes: 0 % (ref 0.0–5.0)
Lymphocytes %: 17 % — ABNORMAL LOW (ref 20.0–42.0)
Lymphocytes Absolute: 0.67 10*3/uL — ABNORMAL LOW (ref 1.50–4.00)
MCH: 31.7 pg (ref 26.0–35.0)
MCHC: 33.1 g/dL (ref 32.0–34.5)
MCV: 95.7 fL (ref 80.0–99.9)
MPV: 11.4 fL (ref 7.0–12.0)
Monocytes %: 10 % (ref 2.0–12.0)
Monocytes Absolute: 0.38 10*3/uL (ref 0.10–0.95)
Neutrophils %: 68 % (ref 43.0–80.0)
Neutrophils Absolute: 2.61 10*3/uL (ref 1.80–7.30)
Platelets: 86 10*3/uL — ABNORMAL LOW (ref 130–450)
RBC: 3.47 m/uL — ABNORMAL LOW (ref 3.50–5.50)
RDW: 15 % (ref 11.5–15.0)
WBC: 3.8 10*3/uL — ABNORMAL LOW (ref 4.5–11.5)

## 2022-07-08 LAB — COMPREHENSIVE METABOLIC PANEL W/ REFLEX TO MG FOR LOW K
ALT: 25 U/L (ref 0–32)
AST: 40 U/L — ABNORMAL HIGH (ref 0–31)
Albumin: 3.7 g/dL (ref 3.5–5.2)
Alkaline Phosphatase: 98 U/L (ref 35–104)
Anion Gap: 10 mmol/L (ref 7–16)
BUN: 14 mg/dL (ref 6–23)
CO2: 20 mmol/L — ABNORMAL LOW (ref 22–29)
Calcium: 9.4 mg/dL (ref 8.6–10.2)
Chloride: 107 mmol/L (ref 98–107)
Creatinine: 0.9 mg/dL (ref 0.50–1.00)
Est, Glom Filt Rate: >60 mL/min/{1.73_m2} (ref >60)
Glucose: 96 mg/dL (ref 74–99)
Potassium: 5.1 mmol/L — ABNORMAL HIGH (ref 3.5–5.0)
Sodium: 137 mmol/L (ref 132–146)
Total Bilirubin: 1.1 mg/dL (ref 0.0–1.2)
Total Protein: 7.4 g/dL (ref 6.4–8.3)

## 2022-07-08 LAB — LIPASE: Lipase: 167 U/L — ABNORMAL HIGH (ref 13–60)

## 2022-07-08 LAB — PLATELET CONFIRMATION

## 2022-07-08 LAB — TROPONIN: Troponin, High Sensitivity: 27 ng/L — ABNORMAL HIGH (ref 0–9)

## 2022-07-08 MED ORDER — FAMOTIDINE (PF) 20 MG/2ML IV SOLN
202 MG/2ML | Freq: Once | INTRAVENOUS | Status: AC
Start: 2022-07-08 — End: 2022-07-08
  Administered 2022-07-08: 21:00:00 20 mg via INTRAVENOUS

## 2022-07-08 MED ORDER — ONDANSETRON HCL 4 MG/2ML IJ SOLN
4 MG/2ML | Freq: Four times a day (QID) | INTRAMUSCULAR | Status: DC | PRN
Start: 2022-07-08 — End: 2022-07-09
  Administered 2022-07-08: 21:00:00 4 mg via INTRAVENOUS

## 2022-07-08 MED ORDER — SODIUM CHLORIDE 0.9 % IV BOLUS
0.9 % | Freq: Once | INTRAVENOUS | Status: AC
Start: 2022-07-08 — End: 2022-07-08
  Administered 2022-07-08: 21:00:00 1000 mL via INTRAVENOUS

## 2022-07-08 MED FILL — FAMOTIDINE (PF) 20 MG/2ML IV SOLN: 20 MG/2ML | INTRAVENOUS | Qty: 2

## 2022-07-08 MED FILL — ONDANSETRON HCL 4 MG/2ML IJ SOLN: 4 MG/2ML | INTRAMUSCULAR | Qty: 2

## 2022-07-08 NOTE — ED Provider Notes (Cosign Needed)
[facility]  EMERGENCY DEPARTMENT ENCOUNTER        Pt Name: Tricia OliveJamilah Leyda  MRN: [id number]  Birthdate [birth year]  Date of evaluation: 07/08/2022  Provider: Juanda BondKatherine Lyda Colcord, MD  PCP: No primary care provider on file.  Note Started: 5:08 PM EDT 07/08/22    CHIEF COMPLAINT       Chief Complaint   Patient presents with    Chest Pain     Started 2 days ago. Pt states intermittent.     Shortness of Breath    Gastroesophageal Reflux       HISTORY OF PRESENT ILLNESS: 1 or more Elements   History From: patient    Limitations to history : None    Tricia Espinoza is a 66 y.o. female with a history of DM, liver cirrhosis due to hepatitis C who presents for burning chest pain for the last 3 days as well as mild shortness of breath, mild intermittent headache, nausea, lightheadedness.  Patient states she vomited earlier today and had an episode of diarrhea that was nonbloody.  Patient states she does have a history of GERD but she has had it more frequently recently which is abnormal for her.  Denies fever, constipation, urinary symptoms.  Denies alcohol use.  Patient has family history of cardiac disease.  Denies smoking.  Denies HTN, HLD.    Nursing Notes were all reviewed and agreed with or any disagreements were addressed in the HPI.        REVIEW OF EXTERNAL NOTE :       No prior notes for review    REVIEW OF SYSTEMS :           Positives and Pertinent negatives as per HPI.     SURGICAL HISTORY   History reviewed. No pertinent surgical history.    CURRENTMEDICATIONS       Discharge Medication List as of 07/09/2022 12:34 AM          ALLERGIES     Latex and Sulfa antibiotics    FAMILYHISTORY     History reviewed. No pertinent family history.     SOCIAL HISTORY       Social History     Tobacco Use    Smoking status: Never    Smokeless tobacco: Never   Substance Use Topics    Alcohol use: Not Currently    Drug use: Never       SCREENINGS        Glasgow Coma  Scale  Eye Opening: Spontaneous  Best Verbal Response: Oriented  Best Motor Response: Obeys commands  Glasgow Coma Scale Score: 15        Heart Score for chest pain patients  History: Slightly Suspicious  ECG: Normal  Patient Age: > 65 years  *Risk factors for Atherosclerotic disease: Diabetes Mellitus, Positive family History  Risk Factors: 1 or 2 risk factors  Troponin: < 1X normal limit  Heart Score Total: 3       CIWA Assessment  BP: 126/74  Pulse: 65           PHYSICAL EXAM  1 or more Elements     ED Triage Vitals   BP Temp Temp src Pulse Resp SpO2 Height Weight   07/08/22 1627 07/08/22 1627 -- 07/08/22 1615 -- 07/08/22 1615 -- --   129/79 98 F (36.7 C)  78  98 %         Constitutional/General: Alert and oriented x3. No acute  distress  Head: Normocephalic and atraumatic  Eyes: PERRL, EOMI, conjunctiva normal, sclera non icteric  ENT:  Oropharynx clear, handling secretions, no trismus  Neck: Supple, full ROM, no stridor, no meningeal signs  Respiratory: Lungs clear to auscultation bilaterally, no wheezes, rales, or rhonchi. Not in respiratory distress  Cardiovascular:  Regular rate. Regular rhythm. No murmurs, no gallops, no rubs. 2+ distal pulses.   GI:  Abdomen Soft, Non tender, Non distended.  No rebound, guarding, or rigidity.   Musculoskeletal: Moves all extremities x 4. Warm and well perfused, no clubbing, no cyanosis, no edema.   Integument: skin warm and dry. No rashes.   Neurologic: GCS 15, no focal deficits, symmetric strength 5/5 in the upper and lower extremities bilaterally  Psychiatric: Normal Affect            DIAGNOSTIC RESULTS   LABS:    Labs Reviewed   CBC WITH AUTO DIFFERENTIAL - Abnormal; Notable for the following components:       Result Value    WBC 3.8 (*)     RBC 3.47 (*)     Hemoglobin 11.0 (*)     Hematocrit 33.2 (*)     Platelets 86 (*)     Lymphocytes % 17 (*)     Lymphocytes Absolute 0.67 (*)     All other components within normal limits   COMPREHENSIVE METABOLIC PANEL W/ REFLEX TO  MG FOR LOW K - Abnormal; Notable for the following components:    Potassium 5.1 (*)     CO2 20 (*)     AST 40 (*)     All other components within normal limits   LIPASE - Abnormal; Notable for the following components:    Lipase 167 (*)     All other components within normal limits   TROPONIN - Abnormal; Notable for the following components:    Troponin, High Sensitivity 27 (*)     All other components within normal limits   TROPONIN - Abnormal; Notable for the following components:    Troponin, High Sensitivity 23 (*)     All other components within normal limits   D-DIMER, QUANTITATIVE - Abnormal; Notable for the following components:    D-Dimer, Quant 2,309 (*)     All other components within normal limits   TROPONIN - Abnormal; Notable for the following components:    Troponin, High Sensitivity 25 (*)     All other components within normal limits   PLATELET CONFIRMATION       As interpreted by me, the above displayed labs are abnormal. All other labs obtained during this visit were within normal range or not returned as of this dictation.      EKG Interpretation  Interpreted by emergency department physician, Juanda Bond, MD    See ED course      RADIOLOGY:   Non-plain film images such as CT, Ultrasound and MRI are read by the radiologist. Plain radiographic images are visualized and preliminarily interpreted by the ED Provider with the below findings:    CXR shows no pleural effusions, PTX, consolidations    Interpretation per the Radiologist below, if available at the time of this note:    CTA PULMONARY W CONTRAST   Final Result   1.  No acute pulmonary emboli.      2.  No aneurysm formation or dissection of the thoracic aorta.      3.  No acute pulmonary parenchymal process.      4.  For  findings in the abdomen see report CT scan abdomen pelvis performed   on the same location.         CT ABDOMEN PELVIS W IV CONTRAST Additional Contrast? None   Final Result   1.  Advanced stage for liver cirrhosis with  borderline size the spleen and   extensive portal collateral circulation including esophageal varices.      2.  Multi cystic appearance for the liver.  The cysts have size in the cm to   subcentimeter size range.  Most of them are simple cysts.  Few have density   higher than a simple cyst more difficult to be characterize.      3.  Consider further evaluation with MRI of the liver multiphase contrast   study.      4.  There is a small cyst-like in the left kidney which has a relative higher   density.  If MRI be performed for the liver, the MRI will also evaluate the   left kidney.         XR CHEST PORTABLE   Final Result   Lobular margins of the cardiac silhouette.  No additional cardiopulmonary   pathology seen on this study.           No results found.    No results found.    PROCEDURES   Unless otherwise noted below, none          CRITICAL CARE TIME (.cct)       PAST MEDICAL HISTORY/Chronic Conditions Affecting Care      has no past medical history on file.     EMERGENCY DEPARTMENT COURSE    Vitals:    Vitals:    07/08/22 1615 07/08/22 1627 07/08/22 2048 07/09/22 0046   BP:  129/79 132/72 126/74   Pulse: 78  62 65   Resp:   18 14   Temp:  98 F (36.7 C)  98.4 F (36.9 C)   TempSrc:    Oral   SpO2: 98%  98% 96%       Patient was given the following medications:  Medications   sodium chloride 0.9 % bolus 1,000 mL (0 mLs IntraVENous Stopped 07/08/22 1933)   famotidine (PEPCID) 20 mg in sodium chloride (PF) 0.9 % 10 mL injection (20 mg IntraVENous Given 07/08/22 1719)   iopamidol (ISOVUE-370) 76 % injection 75 mL (75 mLs IntraVENous Given 07/08/22 2316)           Is this patient to be included in the SEP-1 Core Measure due to severe sepsis or septic shock?   No   Exclusion criteria - the patient is NOT to be included for SEP-1 Core Measure due to:  Infection is not suspected        Medical Decision Making/Differential Diagnosis:    CC/HPI Summary, Social Determinants of health, Records Reviewed, DDx, testing  done/not done, ED Course, Reassessment, disposition considerations/shared decision making with patient, consults, disposition:      ED Course as of 07/09/22 0318   Sat Jul 09, 2022   0015 EKG shows normal sinus rhythm at a rate of 70 with normal PR interval, normal QRS, QTc of 427 with no ST elevation or depression, no EKG for comparison [KM]      ED Course User Index  [KM] Juanda Bond, MD      She is a 67 year old female presenting for burning central chest pain for 3 days, mild shortness of breath, mild intermittent headache, nausea, lightheadedness, 1  episode of vomiting today.  Differentials include but not limited to ACS, pneumonia, PE, GERD, pancreatitis, cholecystitis, dehydration.  At the time of my exam the patient is resting comfortably in a chair in no acute distress.  Vitals are unremarkable and within normal limits.  She is afebrile.  Exam shows abdomen soft and nontender, heart regular rate and rhythm, lungs clear to auscultation bilaterally.  She is no focal neurological deficits on exam.  CBC shows thrombocytopenia to 86,000, WBC 3.8, hemoglobin 11.0.  CMP shows mild hyperkalemia to 5.1 otherwise unremarkable.  EKG is unremarkable.  Troponin is 27, 23, 25.  Chest x-ray is unremarkable.  D-dimer is elevated to 2309.  Her CTA pulmonary shows no acute pulmonary emboli, no aneurysm or dissection of thoracic aorta, no acute pulmonary parenchymal process.  CT abdomen shows advanced age liver cirrhosis with borderline size of spleen and extensive portal collateral circulation with esophageal varices.  Patient is aware of these findings from prior.  CT abdomen otherwise unremarkable.  The patient was given 1 L bolus of fluids as well as Pepcid.  On reexamination her vitals have remained stable.  She states her pain has resolved.  She appears well and is ready to go home.  I discussed plan for discharge with follow-up with her PCP and she understands and agreeable with the plan.  She was given  prescription for Pepcid as she has omeprazole at home already.  Patient is stable for discharge at this time      CONSULTS: (Who and What was discussed)  None      I am the Primary Clinician of Record.    FINAL IMPRESSION      1. Atypical chest pain    2. Thrombocytopenia [facility])          DISPOSITION/PLAN     DISPOSITION Decision To Discharge 07/09/2022 12:14:56 AM      PATIENT REFERRED TO:  Primary Care Physician  Please call [place] at [phone] to make an appointment for PCP follow up          DISCHARGE MEDICATIONS:  Discharge Medication List as of 07/09/2022 12:34 AM        START taking these medications    Details   famotidine (PEPCID) 20 MG tablet Take 1 tablet by mouth 2 times daily, Disp-60 tablet, R-0Normal             DISCONTINUED MEDICATIONS:  Discharge Medication List as of 07/09/2022 12:34 AM                 (Please note that portions of this note were completed with a voice recognition program.  Efforts were made to edit the dictations but occasionally words are mis-transcribed.)    Sullivan Lone, MD (electronically signed)            Sullivan Lone, MD  Resident  07/09/22 [phone]

## 2022-07-09 LAB — D-DIMER, QUANTITATIVE: D-Dimer, Quant: 2309 ng/mL DDU — ABNORMAL HIGH (ref 0–232)

## 2022-07-09 LAB — TROPONIN
Troponin, High Sensitivity: 23 ng/L — ABNORMAL HIGH (ref 0–9)
Troponin, High Sensitivity: 25 ng/L — ABNORMAL HIGH (ref 0–9)

## 2022-07-09 MED ORDER — FAMOTIDINE 20 MG PO TABS
20 MG | ORAL_TABLET | Freq: Two times a day (BID) | ORAL | 0 refills | Status: AC
Start: 2022-07-09 — End: ?

## 2022-07-09 MED ORDER — IOPAMIDOL 76 % IV SOLN
76 % | Freq: Once | INTRAVENOUS | Status: AC | PRN
Start: 2022-07-09 — End: 2022-07-08
  Administered 2022-07-09: 03:00:00 75 mL via INTRAVENOUS

## 2022-07-09 NOTE — Discharge Instructions (Addendum)
Please follow up with your primary care physician. Please return to the emergency department if symptoms worsen.

## 2022-07-11 LAB — EKG 12-LEAD
Atrial Rate: 70 {beats}/min
P Axis: 11 degrees
P-R Interval: 182 ms
Q-T Interval: 396 ms
QRS Duration: 68 ms
QTc Calculation (Bazett): 427 ms
R Axis: -10 degrees
T Axis: 20 degrees
Ventricular Rate: 70 {beats}/min

## 2022-07-21 ENCOUNTER — Ambulatory Visit: Payer: Federal, State, Local not specified - PPO

## 2022-07-21 ENCOUNTER — Other Ambulatory Visit: Payer: Federal, State, Local not specified - PPO

## 2022-07-21 ENCOUNTER — Ambulatory Visit: Payer: Federal, State, Local not specified - PPO | Admitting: Medical Oncology

## 2022-07-21 ENCOUNTER — Ambulatory Visit: Payer: Federal, State, Local not specified - PPO | Admitting: Nurse Practitioner

## 2022-07-27 ENCOUNTER — Ambulatory Visit (INDEPENDENT_AMBULATORY_CARE_PROVIDER_SITE_OTHER): Payer: Medicare Other | Admitting: Dermatology

## 2022-07-27 DIAGNOSIS — L820 Inflamed seborrheic keratosis: Secondary | ICD-10-CM | POA: Diagnosis not present

## 2022-07-27 DIAGNOSIS — L814 Other melanin hyperpigmentation: Secondary | ICD-10-CM | POA: Diagnosis not present

## 2022-07-27 DIAGNOSIS — B078 Other viral warts: Secondary | ICD-10-CM

## 2022-07-27 DIAGNOSIS — L821 Other seborrheic keratosis: Secondary | ICD-10-CM

## 2022-07-27 DIAGNOSIS — D485 Neoplasm of uncertain behavior of skin: Secondary | ICD-10-CM

## 2022-07-27 NOTE — Progress Notes (Signed)
   Follow-Up Visit   Subjective  Roberta Lane is a 66 y.o. female who presents for the following: Growth (Left medial canthus x 3 weeks, irritated.). She has other growths on face and neck she wants checked.  Previously treated spot on right cheek has come back.  She also has some irritated growths on her left upper eyelid that she would like removed.   The following portions of the chart were reviewed this encounter and updated as appropriate:       Review of Systems:  No other skin or systemic complaints except as noted in HPI or Assessment and Plan.  Objective  Well appearing patient in no apparent distress; mood and affect are within normal limits.  A focused examination was performed including face. Relevant physical exam findings are noted in the Assessment and Plan.  Left Medial Canthus 5.0 mm waxy pedunculated papule  L upper eyelid x 4, R lower cheek (recurrent) x 1 (5) Erythematous stuck-on, waxy papule or plaque    Assessment & Plan  Neoplasm of uncertain behavior of skin Left Medial Canthus  Epidermal / dermal shaving  Informed consent: discussed and consent obtained   Patient was prepped and draped in usual sterile fashion: Area prepped with alcohol. Anesthesia: the lesion was anesthetized in a standard fashion   Anesthetic:  1% lidocaine w/ epinephrine 1-100,000 buffered w/ 8.4% NaHCO3 Instrument used: scissors   Hemostasis achieved with: pressure, aluminum chloride and electrodesiccation   Outcome: patient tolerated procedure well   Post-procedure details: wound care instructions given   Post-procedure details comment:  Ointment and small bandage applied  Specimen 1 - Surgical pathology Differential Diagnosis: Irritated Seborrheic Keratosis vs Wart vs other Check Margins: No  Inflamed seborrheic keratosis (5) L upper eyelid x 4, R lower cheek (recurrent) x 1  Symptomatic, irritating, patient would like treated.  Destruction of lesion - L upper  eyelid x 4, R lower cheek (recurrent) x 1  Destruction method: cryotherapy   Informed consent: discussed and consent obtained   Lesion destroyed using liquid nitrogen: Yes   Region frozen until ice ball extended beyond lesion: Yes   Outcome: patient tolerated procedure well with no complications   Post-procedure details: wound care instructions given   Additional details:  Prior to procedure, discussed risks of blister formation, small wound, skin dyspigmentation, or rare scar following cryotherapy. Recommend Vaseline ointment to treated areas while healing.   Seborrheic Keratoses - Stuck-on, waxy, tan-brown papules face, neck - Benign-appearing - Discussed benign etiology and prognosis. - Observe - Call for any changes  Lentigines - Scattered tan macules - Due to sun exposure - Benign-appearing, observe - Recommend daily broad spectrum sunscreen SPF 30+ to sun-exposed areas, reapply every 2 hours as needed. - Call for any changes   Return if symptoms worsen or fail to improve.  IJamesetta Orleans, CMA, am acting as scribe for Brendolyn Patty, MD .  Documentation: I have reviewed the above documentation for accuracy and completeness, and I agree with the above.  Brendolyn Patty MD

## 2022-07-27 NOTE — Patient Instructions (Addendum)
Cryotherapy Aftercare  Wash gently with soap and water everyday.   Apply Vaseline and Band-Aid daily until healed.  Wound Care Instructions  Cleanse wound gently with soap and water once a day then pat dry with clean gauze. Apply a thin coat of Petrolatum (petroleum jelly, "Vaseline") over the wound (unless you have an allergy to this). We recommend that you use a new, sterile tube of Vaseline. Do not pick or remove scabs. Do not remove the yellow or white "healing tissue" from the base of the wound.  Cover the wound with fresh, clean, nonstick gauze and secure with paper tape. You may use Band-Aids in place of gauze and tape if the wound is small enough, but would recommend trimming much of the tape off as there is often too much. Sometimes Band-Aids can irritate the skin.  You should call the office for your biopsy report after 1 week if you have not already been contacted.  If you experience any problems, such as abnormal amounts of bleeding, swelling, significant bruising, significant pain, or evidence of infection, please call the office immediately.  FOR ADULT SURGERY PATIENTS: If you need something for pain relief you may take 1 extra strength Tylenol (acetaminophen) AND 2 Ibuprofen (200mg each) together every 4 hours as needed for pain. (do not take these if you are allergic to them or if you have a reason you should not take them.) Typically, you may only need pain medication for 1 to 3 days.      Due to recent changes in healthcare laws, you may see results of your pathology and/or laboratory studies on MyChart before the doctors have had a chance to review them. We understand that in some cases there may be results that are confusing or concerning to you. Please understand that not all results are received at the same time and often the doctors may need to interpret multiple results in order to provide you with the best plan of care or course of treatment. Therefore, we ask that you  please give us 2 business days to thoroughly review all your results before contacting the office for clarification. Should we see a critical lab result, you will be contacted sooner.   If You Need Anything After Your Visit  If you have any questions or concerns for your doctor, please call our main line at [phone] and press option 4 to reach your doctor's medical assistant. If no one answers, please leave a voicemail as directed and we will return your call as soon as possible. Messages left after 4 pm will be answered the following business day.   You may also send us a message via MyChart. We typically respond to MyChart messages within 1-2 business days.  For prescription refills, please ask your pharmacy to contact our office. Our fax number is [phone].  If you have an urgent issue when the clinic is closed that cannot wait until the next business day, you can page your doctor at the number below.    Please note that while we do our best to be available for urgent issues outside of office hours, we are not available 24/7.   If you have an urgent issue and are unable to reach us, you may choose to seek medical care at your doctor's office, retail clinic, urgent care center, or emergency room.  If you have a medical emergency, please immediately call 911 or go to the emergency department.  Pager Numbers  - Dr. Kowalski: [phone]  -   Dr. Moye: [phone]  - Dr. Stewart: [phone]  In the event of inclement weather, please call our main line at [phone] for an update on the status of any delays or closures.  Dermatology Medication Tips: Please keep the boxes that topical medications come in in order to help keep track of the instructions about where and how to use these. Pharmacies typically print the medication instructions only on the boxes and not directly on the medication tubes.   If your medication is too expensive, please contact our office at  [phone] option 4 or send us a message through MyChart.   We are unable to tell what your co-pay for medications will be in advance as this is different depending on your insurance coverage. However, we may be able to find a substitute medication at lower cost or fill out paperwork to get insurance to cover a needed medication.   If a prior authorization is required to get your medication covered by your insurance company, please allow us 1-2 business days to complete this process.  Drug prices often vary depending on where the prescription is filled and some pharmacies may offer cheaper prices.  The website www.goodrx.com contains coupons for medications through different pharmacies. The prices here do not account for what the cost may be with help from insurance (it may be cheaper with your insurance), but the website can give you the price if you did not use any insurance.  - You can print the associated coupon and take it with your prescription to the pharmacy.  - You may also stop by our office during regular business hours and pick up a GoodRx coupon card.  - If you need your prescription sent electronically to a different pharmacy, notify our office through [facility] MyChart or by phone at [phone] option 4.     Si Usted Necesita Algo Despus de Su Visita  Tambin puede enviarnos un mensaje a travs de MyChart. Por lo general respondemos a los mensajes de MyChart en el transcurso de 1 a 2 das hbiles.  Para renovar recetas, por favor pida a su farmacia que se ponga en contacto con nuestra oficina. Nuestro nmero de fax es el [phone].  Si tiene un asunto urgente cuando la clnica est cerrada y que no puede esperar hasta el siguiente da hbil, puede llamar/localizar a su doctor(a) al nmero que aparece a continuacin.   Por favor, tenga en cuenta que aunque hacemos todo lo posible para estar disponibles para asuntos urgentes fuera del horario de oficina, no estamos  disponibles las 24 horas del da, los 7 das de la semana.   Si tiene un problema urgente y no puede comunicarse con nosotros, puede optar por buscar atencin mdica  en el consultorio de su doctor(a), en una clnica privada, en un centro de atencin urgente o en una sala de emergencias.  Si tiene una emergencia mdica, por favor llame inmediatamente al 911 o vaya a la sala de emergencias.  Nmeros de bper  - Dr. Kowalski: [phone]  - Dra. Moye: [phone]  - Dra. Stewart: [phone]  En caso de inclemencias del tiempo, por favor llame a nuestra lnea principal al [phone] para una actualizacin sobre el estado de cualquier retraso o cierre.  Consejos para la medicacin en dermatologa: Por favor, guarde las cajas en las que vienen los medicamentos de uso tpico para ayudarle a seguir las instrucciones sobre dnde y cmo usarlos. Las farmacias generalmente imprimen las instrucciones del medicamento slo en las cajas y   no directamente en los tubos del medicamento.   Si su medicamento es muy caro, por favor, pngase en contacto con nuestra oficina llamando al [phone] y presione la opcin 4 o envenos un mensaje a travs de MyChart.   No podemos decirle cul ser su copago por los medicamentos por adelantado ya que esto es diferente dependiendo de la cobertura de su seguro. Sin embargo, es posible que podamos encontrar un medicamento sustituto a menor costo o llenar un formulario para que el seguro cubra el medicamento que se considera necesario.   Si se requiere una autorizacin previa para que su compaa de seguros cubra su medicamento, por favor permtanos de 1 a 2 das hbiles para completar este proceso.  Los precios de los medicamentos varan con frecuencia dependiendo del lugar de dnde se surte la receta y alguna farmacias pueden ofrecer precios ms baratos.  El sitio web www.goodrx.com tiene cupones para medicamentos de diferentes farmacias. Los precios aqu no  tienen en cuenta lo que podra costar con la ayuda del seguro (puede ser ms barato con su seguro), pero el sitio web puede darle el precio si no utiliz ningn seguro.  - Puede imprimir el cupn correspondiente y llevarlo con su receta a la farmacia.  - Tambin puede pasar por nuestra oficina durante el horario de atencin regular y recoger una tarjeta de cupones de GoodRx.  - Si necesita que su receta se enve electrnicamente a una farmacia diferente, informe a nuestra oficina a travs de MyChart de [place] o por telfono llamando al [phone] y presione la opcin 4.  

## 2022-08-01 ENCOUNTER — Telehealth: Payer: Self-pay

## 2022-08-01 NOTE — Telephone Encounter (Signed)
Tried to call patient with biopsy results. No answer and mailbox full.  08/02/22 No answer and mailbox full.

## 2022-08-01 NOTE — Telephone Encounter (Signed)
-----   Message from Brendolyn Patty, MD sent at 08/01/2022  1:03 PM EDT ----- Skin , left medial canthus VERRUCA VULGARIS, INFLAMED  Benign inflamed wart - please call patient

## 2022-08-02 ENCOUNTER — Telehealth: Payer: Self-pay

## 2022-08-02 NOTE — Telephone Encounter (Signed)
Advised pt of bx result/sh ?

## 2022-08-02 NOTE — Telephone Encounter (Signed)
-----   Message from Brendolyn Patty, MD sent at 08/01/2022  1:03 PM EDT ----- Skin , left medial canthus VERRUCA VULGARIS, INFLAMED  Benign inflamed wart - please call patient

## 2022-08-04 NOTE — Progress Notes (Deleted)
[place]  Telephone:(336) [phone] Fax:(336) [phone]  ID: Roberta Lane OB: 1956/03/12  MR#: [id number]  CSN#:[id number]  Patient Care Team: [place] Pink, MD as PCP - General (Family Medicine) Lloyd Huger, MD as Consulting Physician (Oncology)  CHIEF COMPLAINT: Pancytopenia, iron deficiency anemia.  Left renal mass.  INTERVAL HISTORY: Patient returns to clinic today for repeat laboratory work, further evaluation, and discussion of her imaging results.  She noticed increased weakness and fatigue recently, but otherwise has felt well.  She has no neurologic complaints.  She denies any recent fevers or illnesses.  She has a good appetite and denies weight loss.  She denies any chest pain, cough, hemoptysis, or shortness of breath.  She denies any abdominal pain. She denies any nausea, vomiting, constipation, or diarrhea.  She has no melena or hematochezia. She has no urinary complaints.  Patient offers no further specific complaints today.  REVIEW OF SYSTEMS:   Review of Systems  Constitutional:  Positive for malaise/fatigue. Negative for fever and weight loss.  Respiratory: Negative.  Negative for cough, hemoptysis and shortness of breath.   Cardiovascular: Negative.  Negative for chest pain and leg swelling.  Gastrointestinal: Negative.  Negative for abdominal pain, blood in stool and melena.  Genitourinary: Negative.  Negative for dysuria and hematuria.  Musculoskeletal: Negative.  Negative for back pain.  Skin: Negative.  Negative for rash.  Neurological:  Positive for weakness. Negative for dizziness, sensory change, focal weakness and headaches.  Psychiatric/Behavioral: Negative.  The patient is not nervous/anxious.     As per HPI. Otherwise, a complete review of systems is negative.  PAST MEDICAL HISTORY: Past Medical History:  Diagnosis Date   Abdominal hernia    Allergic genetic state    Aortic ejection murmur    Ascites    Chronic  hepatitis C ([place])    Depression    Diabetes mellitus without complication (HCC)    Dysrhythmia    GERD (gastroesophageal reflux disease)    GIB (gastrointestinal bleeding)    Headache    Hepatic cirrhosis (HCC)    Hepatitis C    Hernia, abdominal    Hyperlipidemia    Hypertension    IDA (iron deficiency anemia)    Inflamed seborrheic keratosis    Interdigital neuroma of foot    Irregular heart rhythm    Liver disease    Multiple renal cysts    Palpitations    Pancytopenia (HCC)    Portal hypertension with esophageal varices (HCC)    Splenomegaly    UTI (urinary tract infection)     PAST SURGICAL HISTORY: Past Surgical History:  Procedure Laterality Date   ABDOMINAL HYSTERECTOMY     CHOLECYSTECTOMY     ESOPHAGOGASTRODUODENOSCOPY N/A 02/17/2021   Procedure: ESOPHAGOGASTRODUODENOSCOPY (EGD);  Surgeon: Toledo, Benay Pike, MD;  Location: [facility];  Service: Gastroenterology;  Laterality: N/A;   ESOPHAGOGASTRODUODENOSCOPY (EGD) WITH PROPOFOL N/A 11/23/2017   Procedure: ESOPHAGOGASTRODUODENOSCOPY (EGD) WITH PROPOFOL;  Surgeon: Toledo, Benay Pike, MD;  Location: [facility];  Service: Gastroenterology;  Laterality: N/A;   ESOPHAGOGASTRODUODENOSCOPY (EGD) WITH PROPOFOL N/A 10/23/2018   Procedure: ESOPHAGOGASTRODUODENOSCOPY (EGD) WITH PROPOFOL;  Surgeon: Toledo, Benay Pike, MD;  Location: [facility];  Service: Gastroenterology;  Laterality: N/A;   ESOPHAGOGASTRODUODENOSCOPY (EGD) WITH PROPOFOL N/A 10/10/2019   Procedure: ESOPHAGOGASTRODUODENOSCOPY (EGD) WITH PROPOFOL;  Surgeon: Toledo, Benay Pike, MD;  Location: [facility];  Service: Gastroenterology;  Laterality: N/A;   ESOPHAGOGASTRODUODENOSCOPY (EGD) WITH PROPOFOL N/A 11/14/2019   Procedure: ESOPHAGOGASTRODUODENOSCOPY (EGD) WITH PROPOFOL;  Surgeon: Russell, [place]  K, MD;  Location: [facility];  Service: Gastroenterology;  Laterality: N/A;   right elbow surgery     WISDOM TOOTH EXTRACTION      FAMILY HISTORY: Family  History  Problem Relation Age of Onset   Diabetes Mother    Heart attack Mother    Hypertension Father    Thyroid disease Sister    Sickle cell anemia Sister    Breast cancer Neg Hx     ADVANCED DIRECTIVES (Y/N):  N  HEALTH MAINTENANCE: Social History   Tobacco Use   Smoking status: Never   Smokeless tobacco: Never  Vaping Use   Vaping Use: Never used  Substance Use Topics   Alcohol use: Not Currently   Drug use: Yes    Types: Marijuana     Colonoscopy:  PAP:  Bone density:  Lipid panel:  Allergies  Allergen Reactions   Penicillins Anaphylaxis   Amlodipine Besylate Itching   Metronidazole Other (See Comments)    Patient states that she felt "foggy" and "loopy" after taking medication.   Diphenhydramine-Acetaminophen Other (See Comments)    Made my breast hurt.   Enalapril    Furosemide    Sulfa Antibiotics     Reaction unknown.   Tylenol [Acetaminophen] Other (See Comments)    Breast pain   Codeine Itching   Latex Rash    Current Outpatient Medications  Medication Sig Dispense Refill   bumetanide (BUMEX) 1 MG tablet Take 1 mg by mouth daily.     carvedilol (COREG) 3.125 MG tablet Take 3.125 mg by mouth 2 (two) times daily with a meal.     enalapril (VASOTEC) 2.5 MG tablet Take by mouth.     lactulose (CHRONULAC) 10 GM/15ML solution Take 45 mLs (30 g total) by mouth 2 (two) times daily. 240 mL 0   metFORMIN (GLUCOPHAGE) 1000 MG tablet Take 1,000 mg by mouth 2 (two) times daily.     omeprazole (PRILOSEC) 20 MG capsule      rifaximin (XIFAXAN) 550 MG TABS tablet Take by mouth.     spironolactone (ALDACTONE) 100 MG tablet Take 100 mg by mouth daily.     traZODone (DESYREL) 50 MG tablet Take 50 mg by mouth at bedtime.     No current facility-administered medications for this visit.    OBJECTIVE: There were no vitals filed for this visit.    There is no height or weight on file to calculate BMI.    ECOG FS:0 - Asymptomatic  General: Well-developed,  well-nourished, no acute distress. Eyes: Pink conjunctiva, anicteric sclera. HEENT: Normocephalic, moist mucous membranes. Lungs: No audible wheezing or coughing. Heart: Regular rate and rhythm. Abdomen: Soft, nontender, no obvious distention. Musculoskeletal: No edema, cyanosis, or clubbing. Neuro: Alert, answering all questions appropriately. Cranial nerves grossly intact. Skin: No rashes or petechiae noted. Psych: Normal affect.  LAB RESULTS:  Lab Results  Component Value Date   NA 138 07/21/2018   K 4.1 07/21/2018   CL 112 (H) 07/21/2018   CO2 18 (L) 07/21/2018   GLUCOSE 111 (H) 07/21/2018   BUN 8 07/21/2018   CREATININE 0.66 07/21/2018   CALCIUM 8.6 (L) 07/21/2018   PROT 7.9 07/20/2018   ALBUMIN 3.6 07/20/2018   AST 48 (H) 07/20/2018   ALT 27 07/20/2018   ALKPHOS 91 07/20/2018   BILITOT 2.5 (H) 07/20/2018   GFRNONAA >60 07/21/2018   GFRAA >60 07/21/2018    Lab Results  Component Value Date   WBC 3.9 (L) 05/05/2022   NEUTROABS 2.3  05/05/2022   HGB 11.3 (L) 05/05/2022   HCT 32.7 (L) 05/05/2022   MCV 92.6 05/05/2022   PLT 89 (L) 05/05/2022   Lab Results  Component Value Date   IRON 171 (H) 05/05/2022   TIBC 367 05/05/2022   IRONPCTSAT 47 (H) 05/05/2022   Lab Results  Component Value Date   FERRITIN 159 05/05/2022     STUDIES: No results found.   ASSESSMENT: Pancytopenia, iron deficiency anemia.  Left renal mass.  PLAN:    1.  Pancytopenia: Multifactorial including cirrhosis and splenomegaly.  Repeat MRI January 13, 2022 revealed stable splenomegaly of approximately 15 cm.  White count remains decreased, but relatively unchanged at 3.3 platelets also chronically decreased at approximately her baseline at 83.  She was previously noted to have antineutrophil antibodies as well as platelet antibodies but both are likely clinically insignificant given the stability of her laboratory work.  No intervention is needed at this time.  Patient does not require bone  marrow biopsy.  Return to clinic in 6 months for routine evaluation.   2.  Cirrhosis: Patient's most recent AFP continues to be within normal limits at 2.6.  Continue follow-up with GI as indicated. 3.  GI bleed: Patient denies any recent bleeding.  Patient's most recent EGD was on February 17, 2021.   4.  Iron deficiency anemia: Hemoglobin remains decreased, but patient is mildly symptomatic today.  Iron stores within normal limits.  Proceed with 1 dose IV Venofer today.     5.  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  No intervention is needed.  Continue to monitor with yearly imaging.  Repeat MRI in March 2024.  Patient expressed understanding and was in agreement with this plan. She also understands that She can call clinic at any time with any questions, concerns, or complaints.    Lloyd Huger, MD   08/04/2022 2:06 PM

## 2022-08-05 ENCOUNTER — Inpatient Hospital Stay: Payer: Medicare Other

## 2022-08-08 ENCOUNTER — Inpatient Hospital Stay: Payer: Medicare Other | Attending: Oncology

## 2022-08-08 DIAGNOSIS — R161 Splenomegaly, not elsewhere classified: Secondary | ICD-10-CM | POA: Diagnosis not present

## 2022-08-08 DIAGNOSIS — Z90710 Acquired absence of both cervix and uterus: Secondary | ICD-10-CM | POA: Diagnosis not present

## 2022-08-08 DIAGNOSIS — I10 Essential (primary) hypertension: Secondary | ICD-10-CM | POA: Diagnosis not present

## 2022-08-08 DIAGNOSIS — N2889 Other specified disorders of kidney and ureter: Secondary | ICD-10-CM | POA: Insufficient documentation

## 2022-08-08 DIAGNOSIS — D509 Iron deficiency anemia, unspecified: Secondary | ICD-10-CM | POA: Insufficient documentation

## 2022-08-08 DIAGNOSIS — E119 Type 2 diabetes mellitus without complications: Secondary | ICD-10-CM | POA: Insufficient documentation

## 2022-08-08 DIAGNOSIS — D61818 Other pancytopenia: Secondary | ICD-10-CM | POA: Diagnosis not present

## 2022-08-08 DIAGNOSIS — K7460 Unspecified cirrhosis of liver: Secondary | ICD-10-CM | POA: Insufficient documentation

## 2022-08-08 LAB — CBC WITH DIFFERENTIAL/PLATELET
Abs Immature Granulocytes: 0.01 10*3/uL (ref 0.00–0.07)
Basophils Absolute: 0 10*3/uL (ref 0.0–0.1)
Basophils Relative: 1 %
Eosinophils Absolute: 0.1 10*3/uL (ref 0.0–0.5)
Eosinophils Relative: 4 %
HCT: 32.6 % — ABNORMAL LOW (ref 36.0–46.0)
Hemoglobin: 11.1 g/dL — ABNORMAL LOW (ref 12.0–15.0)
Immature Granulocytes: 0 %
Lymphocytes Relative: 24 %
Lymphs Abs: 0.7 10*3/uL (ref 0.7–4.0)
MCH: 31.4 pg (ref 26.0–34.0)
MCHC: 34 g/dL (ref 30.0–36.0)
MCV: 92.4 fL (ref 80.0–100.0)
Monocytes Absolute: 0.4 10*3/uL (ref 0.1–1.0)
Monocytes Relative: 15 %
Neutro Abs: 1.6 10*3/uL — ABNORMAL LOW (ref 1.7–7.7)
Neutrophils Relative %: 56 %
Platelets: 83 10*3/uL — ABNORMAL LOW (ref 150–400)
RBC: 3.53 MIL/uL — ABNORMAL LOW (ref 3.87–5.11)
RDW: 14.8 % (ref 11.5–15.5)
WBC: 2.8 10*3/uL — ABNORMAL LOW (ref 4.0–10.5)
nRBC: 0 % (ref 0.0–0.2)

## 2022-08-08 LAB — FERRITIN: Ferritin: 161 ng/mL (ref 11–307)

## 2022-08-08 LAB — IRON AND TIBC
Iron: 79 ug/dL (ref 28–170)
Saturation Ratios: 23 % (ref 10.4–31.8)
TIBC: 351 ug/dL (ref 250–450)
UIBC: 272 ug/dL

## 2022-08-09 ENCOUNTER — Inpatient Hospital Stay: Payer: Medicare Other

## 2022-08-09 ENCOUNTER — Inpatient Hospital Stay: Payer: Medicare Other | Admitting: Oncology

## 2022-08-09 DIAGNOSIS — D61818 Other pancytopenia: Secondary | ICD-10-CM

## 2022-08-09 DIAGNOSIS — N2889 Other specified disorders of kidney and ureter: Secondary | ICD-10-CM

## 2022-08-09 DIAGNOSIS — D509 Iron deficiency anemia, unspecified: Secondary | ICD-10-CM

## 2022-08-15 MED FILL — Iron Sucrose Inj 20 MG/ML (Fe Equiv): INTRAVENOUS | Qty: 10 | Status: AC

## 2022-08-16 ENCOUNTER — Inpatient Hospital Stay: Payer: Medicare Other

## 2022-08-16 ENCOUNTER — Encounter: Payer: Self-pay | Admitting: Oncology

## 2022-08-16 ENCOUNTER — Inpatient Hospital Stay (HOSPITAL_BASED_OUTPATIENT_CLINIC_OR_DEPARTMENT_OTHER): Payer: Medicare Other | Admitting: Oncology

## 2022-08-16 VITALS — BP 132/72 | HR 70 | Temp 97.2°F | Resp 16 | Ht 64.0 in | Wt 182.0 lb

## 2022-08-16 DIAGNOSIS — D509 Iron deficiency anemia, unspecified: Secondary | ICD-10-CM | POA: Diagnosis not present

## 2022-08-16 DIAGNOSIS — N2889 Other specified disorders of kidney and ureter: Secondary | ICD-10-CM

## 2022-08-16 DIAGNOSIS — D61818 Other pancytopenia: Secondary | ICD-10-CM | POA: Diagnosis not present

## 2022-08-16 NOTE — Progress Notes (Signed)
[facility]  Telephone:(336) [phone] Fax:(336) [phone]  ID: Roberta Lane OB: 1956/03/19  MR#: [id number]  CSN#:[id number]  Patient Care Team: Jerl Mina, MD as PCP - General (Family Medicine) Jeralyn Ruths, MD as Consulting Physician (Oncology)  CHIEF COMPLAINT: Pancytopenia, iron deficiency anemia.  Left renal mass.  INTERVAL HISTORY: Patient returns to clinic today for repeat laboratory work and further evaluation.  She continues to have chronic weakness and fatigue, but otherwise feels well.  She has no neurologic complaints.  She denies any recent fevers or illnesses.  She has a good appetite and denies weight loss.  She denies any chest pain, cough, hemoptysis, or shortness of breath.  She denies any abdominal pain. She denies any nausea, vomiting, constipation, or diarrhea.  She has no melena or hematochezia. She has no urinary complaints.  Patient offers no further specific complaints today.  REVIEW OF SYSTEMS:   Review of Systems  Constitutional:  Positive for malaise/fatigue. Negative for fever and weight loss.  Respiratory: Negative.  Negative for cough, hemoptysis and shortness of breath.   Cardiovascular: Negative.  Negative for chest pain and leg swelling.  Gastrointestinal: Negative.  Negative for abdominal pain, blood in stool and melena.  Genitourinary: Negative.  Negative for dysuria and hematuria.  Musculoskeletal: Negative.  Negative for back pain.  Skin: Negative.  Negative for rash.  Neurological:  Positive for weakness. Negative for dizziness, sensory change, focal weakness and headaches.  Psychiatric/Behavioral: Negative.  The patient is not nervous/anxious.     As per HPI. Otherwise, a complete review of systems is negative.  PAST MEDICAL HISTORY: Past Medical History:  Diagnosis Date   Abdominal hernia    Allergic genetic state    Aortic ejection murmur    Ascites    Chronic hepatitis C (HCC)    Depression    Diabetes  mellitus without complication (HCC)    Dysrhythmia    GERD (gastroesophageal reflux disease)    GIB (gastrointestinal bleeding)    Headache    Hepatic cirrhosis (HCC)    Hepatitis C    Hernia, abdominal    Hyperlipidemia    Hypertension    IDA (iron deficiency anemia)    Inflamed seborrheic keratosis    Interdigital neuroma of foot    Irregular heart rhythm    Liver disease    Multiple renal cysts    Palpitations    Pancytopenia (HCC)    Portal hypertension with esophageal varices (HCC)    Splenomegaly    UTI (urinary tract infection)     PAST SURGICAL HISTORY: Past Surgical History:  Procedure Laterality Date   ABDOMINAL HYSTERECTOMY     CHOLECYSTECTOMY     ESOPHAGOGASTRODUODENOSCOPY N/A 02/17/2021   Procedure: ESOPHAGOGASTRODUODENOSCOPY (EGD);  Surgeon: Toledo, Boykin Nearing, MD;  Location: [facility];  Service: Gastroenterology;  Laterality: N/A;   ESOPHAGOGASTRODUODENOSCOPY (EGD) WITH PROPOFOL N/A 11/23/2017   Procedure: ESOPHAGOGASTRODUODENOSCOPY (EGD) WITH PROPOFOL;  Surgeon: Toledo, Boykin Nearing, MD;  Location: [facility];  Service: Gastroenterology;  Laterality: N/A;   ESOPHAGOGASTRODUODENOSCOPY (EGD) WITH PROPOFOL N/A 10/23/2018   Procedure: ESOPHAGOGASTRODUODENOSCOPY (EGD) WITH PROPOFOL;  Surgeon: Toledo, Boykin Nearing, MD;  Location: [facility];  Service: Gastroenterology;  Laterality: N/A;   ESOPHAGOGASTRODUODENOSCOPY (EGD) WITH PROPOFOL N/A 10/10/2019   Procedure: ESOPHAGOGASTRODUODENOSCOPY (EGD) WITH PROPOFOL;  Surgeon: Toledo, Boykin Nearing, MD;  Location: [facility];  Service: Gastroenterology;  Laterality: N/A;   ESOPHAGOGASTRODUODENOSCOPY (EGD) WITH PROPOFOL N/A 11/14/2019   Procedure: ESOPHAGOGASTRODUODENOSCOPY (EGD) WITH PROPOFOL;  Surgeon: [name], Boykin Nearing, MD;  Location: [facility]  ENDOSCOPY;  Service: Gastroenterology;  Laterality: N/A;   right elbow surgery     WISDOM TOOTH EXTRACTION      FAMILY HISTORY: Family History  Problem Relation Age of Onset    Diabetes Mother    Heart attack Mother    Hypertension Father    Thyroid disease Sister    Sickle cell anemia Sister    Breast cancer Neg Hx     ADVANCED DIRECTIVES (Y/N):  N  HEALTH MAINTENANCE: Social History   Tobacco Use   Smoking status: Never   Smokeless tobacco: Never  Vaping Use   Vaping Use: Never used  Substance Use Topics   Alcohol use: Not Currently   Drug use: Yes    Types: Marijuana     Colonoscopy:  PAP:  Bone density:  Lipid panel:  Allergies  Allergen Reactions   Penicillins Anaphylaxis   Amlodipine Besylate Itching   Metronidazole Other (See Comments)    Patient states that she felt "foggy" and "loopy" after taking medication.   Diphenhydramine-Acetaminophen Other (See Comments)    Made my breast hurt.   Enalapril    Furosemide    Sulfa Antibiotics     Reaction unknown.   Tylenol [Acetaminophen] Other (See Comments)    Breast pain   Codeine Itching   Latex Rash    Current Outpatient Medications  Medication Sig Dispense Refill   bumetanide (BUMEX) 1 MG tablet Take 1 mg by mouth daily.     carvedilol (COREG) 3.125 MG tablet Take 3.125 mg by mouth 2 (two) times daily with a meal.     enalapril (VASOTEC) 2.5 MG tablet Take by mouth.     lactulose (CHRONULAC) 10 GM/15ML solution Take 45 mLs (30 g total) by mouth 2 (two) times daily. 240 mL 0   metFORMIN (GLUCOPHAGE) 1000 MG tablet Take 1,000 mg by mouth 2 (two) times daily.     omeprazole (PRILOSEC) 20 MG capsule      rifaximin (XIFAXAN) 550 MG TABS tablet Take by mouth.     spironolactone (ALDACTONE) 100 MG tablet Take 100 mg by mouth daily.     traZODone (DESYREL) 50 MG tablet Take 50 mg by mouth at bedtime.     No current facility-administered medications for this visit.    OBJECTIVE: Vitals:   08/16/22 1309  BP: 132/72  Pulse: 70  Resp: 16  Temp: (!) 97.2 F (36.2 C)  SpO2: 99%      Body mass index is 31.24 kg/m.    ECOG FS:0 - Asymptomatic  General: Well-developed,  well-nourished, no acute distress. Eyes: Pink conjunctiva, anicteric sclera. HEENT: Normocephalic, moist mucous membranes. Lungs: No audible wheezing or coughing. Heart: Regular rate and rhythm. Abdomen: Soft, nontender, no obvious distention. Musculoskeletal: No edema, cyanosis, or clubbing. Neuro: Alert, answering all questions appropriately. Cranial nerves grossly intact. Skin: No rashes or petechiae noted. Psych: Normal affect.   LAB RESULTS:  Lab Results  Component Value Date   NA 138 07/21/2018   K 4.1 07/21/2018   CL 112 (H) 07/21/2018   CO2 18 (L) 07/21/2018   GLUCOSE 111 (H) 07/21/2018   BUN 8 07/21/2018   CREATININE 0.66 07/21/2018   CALCIUM 8.6 (L) 07/21/2018   PROT 7.9 07/20/2018   ALBUMIN 3.6 07/20/2018   AST 48 (H) 07/20/2018   ALT 27 07/20/2018   ALKPHOS 91 07/20/2018   BILITOT 2.5 (H) 07/20/2018   GFRNONAA >60 07/21/2018   GFRAA >60 07/21/2018    Lab Results  Component Value Date  WBC 2.8 (L) 08/08/2022   NEUTROABS 1.6 (L) 08/08/2022   HGB 11.1 (L) 08/08/2022   HCT 32.6 (L) 08/08/2022   MCV 92.4 08/08/2022   PLT 83 (L) 08/08/2022   Lab Results  Component Value Date   IRON 79 08/08/2022   TIBC 351 08/08/2022   IRONPCTSAT 23 08/08/2022   Lab Results  Component Value Date   FERRITIN 161 08/08/2022     STUDIES: No results found.   ASSESSMENT: Pancytopenia, iron deficiency anemia.  Left renal mass.  PLAN:    1.  Pancytopenia: Multifactorial including cirrhosis and splenomegaly.  Repeat MRI January 13, 2022 revealed stable splenomegaly of approximately 15 cm.  White blood cell count remains decreased, but essentially unchanged at 2.8.  Platelets are also chronically decreased but at her baseline of 83.  She was previously noted to have antineutrophil antibodies as well as platelet antibodies but both are likely clinically insignificant given the stability of her laboratory work.  No intervention is needed at this time.  Patient does not require  bone marrow biopsy.  Return to clinic in 6 months with repeat laboratory work and further evaluation. 2.  Cirrhosis: Patient's most recent AFP continues to be within normal limits at 2.6.  Continue follow-up with GI as indicated. 3.  GI bleed: Patient denies any recent bleeding.  Patient's most recent EGD was on February 17, 2021.   4.  Iron deficiency anemia: Hemoglobin mildly decreased to 11.1, but iron stores remain within normal limits.  She last received IV Venofer on January 18, 2022. 5.  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  No intervention is needed.  Continue to monitor with yearly imaging.  Repeat MRI in March 2024.  Patient expressed understanding and was in agreement with this plan. She also understands that She can call clinic at any time with any questions, concerns, or complaints.    Jeralyn Ruths, MD   08/17/2022 7:13 AM

## 2022-08-17 ENCOUNTER — Encounter: Payer: Self-pay | Admitting: Oncology

## 2023-01-16 ENCOUNTER — Inpatient Hospital Stay: Payer: Medicare Other | Attending: Oncology

## 2023-01-16 ENCOUNTER — Ambulatory Visit
Admission: RE | Admit: 2023-01-16 | Discharge: 2023-01-16 | Disposition: A | Discharge status: Home / Self Care | Payer: Medicare Other | Source: Ambulatory Visit | Attending: Oncology | Admitting: Oncology

## 2023-01-16 ENCOUNTER — Inpatient Hospital Stay: Payer: Federal, State, Local not specified - PPO

## 2023-01-16 DIAGNOSIS — D61818 Other pancytopenia: Secondary | ICD-10-CM | POA: Insufficient documentation

## 2023-01-16 DIAGNOSIS — R531 Weakness: Secondary | ICD-10-CM | POA: Insufficient documentation

## 2023-01-16 DIAGNOSIS — Z90710 Acquired absence of both cervix and uterus: Secondary | ICD-10-CM | POA: Diagnosis not present

## 2023-01-16 DIAGNOSIS — N2889 Other specified disorders of kidney and ureter: Secondary | ICD-10-CM | POA: Diagnosis present

## 2023-01-16 DIAGNOSIS — R161 Splenomegaly, not elsewhere classified: Secondary | ICD-10-CM | POA: Diagnosis not present

## 2023-01-16 DIAGNOSIS — D509 Iron deficiency anemia, unspecified: Secondary | ICD-10-CM | POA: Diagnosis present

## 2023-01-16 DIAGNOSIS — R5383 Other fatigue: Secondary | ICD-10-CM | POA: Insufficient documentation

## 2023-01-16 DIAGNOSIS — K7460 Unspecified cirrhosis of liver: Secondary | ICD-10-CM | POA: Diagnosis not present

## 2023-01-16 LAB — CBC WITH DIFFERENTIAL/PLATELET
Abs Immature Granulocytes: 0.01 10*3/uL (ref 0.00–0.07)
Basophils Absolute: 0 10*3/uL (ref 0.0–0.1)
Basophils Relative: 1 %
Eosinophils Absolute: 0.1 10*3/uL (ref 0.0–0.5)
Eosinophils Relative: 3 %
HCT: 34.9 % — ABNORMAL LOW (ref 36.0–46.0)
Hemoglobin: 11.7 g/dL — ABNORMAL LOW (ref 12.0–15.0)
Immature Granulocytes: 0 %
Lymphocytes Relative: 21 %
Lymphs Abs: 0.8 10*3/uL (ref 0.7–4.0)
MCH: 31.1 pg (ref 26.0–34.0)
MCHC: 33.5 g/dL (ref 30.0–36.0)
MCV: 92.8 fL (ref 80.0–100.0)
Monocytes Absolute: 0.4 10*3/uL (ref 0.1–1.0)
Monocytes Relative: 11 %
Neutro Abs: 2.3 10*3/uL (ref 1.7–7.7)
Neutrophils Relative %: 64 %
Platelets: 76 10*3/uL — ABNORMAL LOW (ref 150–400)
RBC: 3.76 MIL/uL — ABNORMAL LOW (ref 3.87–5.11)
RDW: 14.4 % (ref 11.5–15.5)
WBC: 3.6 10*3/uL — ABNORMAL LOW (ref 4.0–10.5)
nRBC: 0 % (ref 0.0–0.2)

## 2023-01-16 LAB — FERRITIN: Ferritin: 108 ng/mL (ref 11–307)

## 2023-01-16 MED ORDER — GADOBUTROL 1 MMOL/ML IV SOLN
8.0000 mL | Freq: Once | INTRAVENOUS | Status: AC | PRN
  Administered 2023-01-16: 7.5 mL via INTRAVENOUS

## 2023-01-19 ENCOUNTER — Inpatient Hospital Stay (HOSPITAL_BASED_OUTPATIENT_CLINIC_OR_DEPARTMENT_OTHER): Payer: Medicare Other | Admitting: Oncology

## 2023-01-19 ENCOUNTER — Encounter: Payer: Self-pay | Admitting: Oncology

## 2023-01-19 VITALS — BP 135/64 | HR 75 | Temp 98.1°F | Resp 16 | Ht 64.0 in | Wt 185.0 lb

## 2023-01-19 DIAGNOSIS — D509 Iron deficiency anemia, unspecified: Secondary | ICD-10-CM | POA: Diagnosis not present

## 2023-01-19 DIAGNOSIS — D61818 Other pancytopenia: Secondary | ICD-10-CM

## 2023-01-19 DIAGNOSIS — N2889 Other specified disorders of kidney and ureter: Secondary | ICD-10-CM

## 2023-01-19 NOTE — Progress Notes (Signed)
[place]  [phone] [phone]) [phone]  ID: [name] OB: 1956/04/03  MR#: [id number]  CSN#:[id number]  Patient Care Team: [place] Pink, MD as PCP - General (Family Medicine) Lloyd Huger, MD as Consulting Physician (Oncology)  CHIEF COMPLAINT: Pancytopenia, iron deficiency anemia.  Left renal mass.  INTERVAL HISTORY: Patient returns to clinic today for repeat laboratory work, further evaluation, and discussion of her imaging results.  She continues to have chronic weakness and fatigue, but otherwise feels well.  She has no neurologic complaints.  She denies any recent fevers or illnesses.  She has a good appetite and denies weight loss.  She denies any chest pain, cough, hemoptysis, or shortness of breath.  She denies any abdominal pain. She denies any nausea, vomiting, constipation, or diarrhea.  She has no melena or hematochezia. She has no urinary complaints.  Patient offers no further specific requests today.  REVIEW OF SYSTEMS:   Review of Systems  Constitutional:  Positive for malaise/fatigue. Negative for fever and weight loss.  Respiratory: Negative.  Negative for cough, hemoptysis and shortness of breath.   Cardiovascular: Negative.  Negative for chest pain and leg swelling.  Gastrointestinal: Negative.  Negative for abdominal pain, blood in stool and melena.  Genitourinary: Negative.  Negative for dysuria and hematuria.  Musculoskeletal: Negative.  Negative for back pain.  Skin: Negative.  Negative for rash.  Neurological:  Positive for weakness. Negative for dizziness, sensory change, focal weakness and headaches.  Psychiatric/Behavioral: Negative.  The patient is not nervous/anxious.     As per HPI. Otherwise, a complete review of systems is negative.  PAST MEDICAL HISTORY: Past Medical History:  Diagnosis Date   Abdominal hernia    Allergic genetic state    Aortic ejection murmur    Ascites    Chronic hepatitis C  ([place])    Depression    Diabetes mellitus without complication (HCC)    Dysrhythmia    GERD (gastroesophageal reflux disease)    GIB (gastrointestinal bleeding)    Headache    Hepatic cirrhosis (HCC)    Hepatitis C    Hernia, abdominal    Hyperlipidemia    Hypertension    IDA (iron deficiency anemia)    Inflamed seborrheic keratosis    Interdigital neuroma of foot    Irregular heart rhythm    Liver disease    Multiple renal cysts    Palpitations    Pancytopenia (HCC)    Portal hypertension with esophageal varices (HCC)    Splenomegaly    UTI (urinary tract infection)     PAST SURGICAL HISTORY: Past Surgical History:  Procedure Laterality Date   ABDOMINAL HYSTERECTOMY     CHOLECYSTECTOMY     ESOPHAGOGASTRODUODENOSCOPY N/A 02/17/2021   Procedure: ESOPHAGOGASTRODUODENOSCOPY (EGD);  Surgeon: Toledo, Benay Pike, MD;  Location: [facility];  Service: Gastroenterology;  Laterality: N/A;   ESOPHAGOGASTRODUODENOSCOPY (EGD) WITH PROPOFOL N/A 11/23/2017   Procedure: ESOPHAGOGASTRODUODENOSCOPY (EGD) WITH PROPOFOL;  Surgeon: Toledo, Benay Pike, MD;  Location: [facility];  Service: Gastroenterology;  Laterality: N/A;   ESOPHAGOGASTRODUODENOSCOPY (EGD) WITH PROPOFOL N/A 10/23/2018   Procedure: ESOPHAGOGASTRODUODENOSCOPY (EGD) WITH PROPOFOL;  Surgeon: Toledo, Benay Pike, MD;  Location: [facility];  Service: Gastroenterology;  Laterality: N/A;   ESOPHAGOGASTRODUODENOSCOPY (EGD) WITH PROPOFOL N/A 10/10/2019   Procedure: ESOPHAGOGASTRODUODENOSCOPY (EGD) WITH PROPOFOL;  Surgeon: Toledo, Benay Pike, MD;  Location: [facility];  Service: Gastroenterology;  Laterality: N/A;   ESOPHAGOGASTRODUODENOSCOPY (EGD) WITH PROPOFOL N/A 11/14/2019   Procedure: ESOPHAGOGASTRODUODENOSCOPY (EGD) WITH PROPOFOL;  Surgeon: [place]reek, [place]  K, MD;  Location: [facility];  Service: Gastroenterology;  Laterality: N/A;   right elbow surgery     WISDOM TOOTH EXTRACTION      FAMILY HISTORY: Family History   Problem Relation Age of Onset   Diabetes Mother    Heart attack Mother    Hypertension Father    Thyroid disease Sister    Sickle cell anemia Sister    Breast cancer Neg Hx     ADVANCED DIRECTIVES (Y/N):  N  HEALTH MAINTENANCE: Social History   Tobacco Use   Smoking status: Never   Smokeless tobacco: Never  Vaping Use   Vaping Use: Never used  Substance Use Topics   Alcohol use: Not Currently   Drug use: Yes    Types: Marijuana     Colonoscopy:  PAP:  Bone density:  Lipid panel:  Allergies  Allergen Reactions   Penicillins Anaphylaxis   Amlodipine Besylate Itching   Metronidazole Other (See Comments)    Patient states that she felt "foggy" and "loopy" after taking medication.   Diphenhydramine-Acetaminophen Other (See Comments)    Made my breast hurt.   Enalapril    Furosemide    Sulfa Antibiotics     Reaction unknown.   Tylenol [Acetaminophen] Other (See Comments)    Breast pain   Codeine Itching   Latex Rash    Current Outpatient Medications  Medication Sig Dispense Refill   bumetanide (BUMEX) 1 MG tablet Take 1 mg by mouth daily.     carvedilol (COREG) 3.125 MG tablet Take 3.125 mg by mouth 2 (two) times daily with a meal.     enalapril (VASOTEC) 2.5 MG tablet Take by mouth.     lactulose (CHRONULAC) 10 GM/15ML solution Take 45 mLs (30 g total) by mouth 2 (two) times daily. 240 mL 0   metFORMIN (GLUCOPHAGE) 1000 MG tablet Take 1,000 mg by mouth 2 (two) times daily.     omeprazole (PRILOSEC) 20 MG capsule      rifaximin (XIFAXAN) 550 MG TABS tablet Take by mouth.     spironolactone (ALDACTONE) 100 MG tablet Take 100 mg by mouth daily.     traZODone (DESYREL) 50 MG tablet Take 50 mg by mouth at bedtime.     No current facility-administered medications for this visit.    OBJECTIVE: Vitals:   01/19/23 1358  BP: 135/64  Pulse: 75  Resp: 16  Temp: 98.1 F (36.7 C)  SpO2: 98%      Body mass index is 31.76 kg/m.    ECOG FS:0 -  Asymptomatic  General: Well-developed, well-nourished, no acute distress. Eyes: Pink conjunctiva, anicteric sclera. HEENT: Normocephalic, moist mucous membranes. Lungs: No audible wheezing or coughing. Heart: Regular rate and rhythm. Abdomen: Soft, nontender, no obvious distention. Musculoskeletal: No edema, cyanosis, or clubbing. Neuro: Alert, answering all questions appropriately. Cranial nerves grossly intact. Skin: No rashes or petechiae noted. Psych: Normal affect.   LAB RESULTS:  Lab Results  Component Value Date   NA 138 07/21/2018   K 4.1 07/21/2018   CL 112 (H) 07/21/2018   CO2 18 (L) 07/21/2018   GLUCOSE 111 (H) 07/21/2018   BUN 8 07/21/2018   CREATININE 0.66 07/21/2018   CALCIUM 8.6 (L) 07/21/2018   PROT 7.9 07/20/2018   ALBUMIN 3.6 07/20/2018   AST 48 (H) 07/20/2018   ALT 27 07/20/2018   ALKPHOS 91 07/20/2018   BILITOT 2.5 (H) 07/20/2018   GFRNONAA >60 07/21/2018   GFRAA >60 07/21/2018    Lab Results  Component Value Date   WBC 3.6 (L) 01/16/2023   NEUTROABS 2.3 01/16/2023   HGB 11.7 (L) 01/16/2023   HCT 34.9 (L) 01/16/2023   MCV 92.8 01/16/2023   PLT 76 (L) 01/16/2023   Lab Results  Component Value Date   IRON 79 08/08/2022   TIBC 351 08/08/2022   IRONPCTSAT 23 08/08/2022   Lab Results  Component Value Date   FERRITIN 108 01/16/2023     STUDIES: MR ABDOMEN WWO CONTRAST  Result Date: 01/17/2023 CLINICAL DATA:  Follow-up left renal lesion.  Known cirrhosis. EXAM: MRI ABDOMEN WITHOUT AND WITH CONTRAST TECHNIQUE: Multiplanar multisequence MR imaging of the abdomen was performed both before and after the administration of intravenous contrast. CONTRAST:  7.9mL GADAVIST GADOBUTROL 1 MMOL/ML IV SOLN COMPARISON:  Multiple prior imaging studies. The most recent MRI is 01/13/2022 FINDINGS: Lower chest: The lung bases are clear of an acute process. No worrisome pulmonary lesions, pleural or pericardial effusion. Hepatobiliary: Severe/advanced cirrhotic  changes involving the liver with areas of confluent hepatic fibrosis, portal hypertension with portal venous collaterals, massive paraesophageal varices and splenomegaly. Numerous benign hepatic cysts. No worrisome early arterial phase enhancing lesions to suggest hepatoma. The gallbladder is surgically absent. No common bile duct dilatation. Pancreas:  No mass, inflammation or ductal dilatation. Spleen:  Stable splenomegaly.  No splenic lesions. Adrenals/Urinary Tract:  The adrenal glands are unremarkable. Stable 8 mm exophytic enhancing lesion projecting off the interpolar region of the left kidney posteriorly. 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 No progressive findings. No new lesions. Stomach/Bowel: The stomach, duodenum, visualized small bowel and visualized colon are unremarkable. Vascular/Lymphatic: The aorta and branch vessels are patent. Stable scattered upper abdominal lymph nodes typical with cirrhosis. The portal and splenic veins are patent. Other:  Anterior abdominal wall hernia containing fat.  No ascites. Musculoskeletal: No significant bony findings. IMPRESSION: 1. Stable 8 mm exophytic enhancing lesion projecting off the midpole/lower pole region of the left kidney posteriorly. 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 No progressive findings. 2. Severe/advanced cirrhotic changes involving the liver with areas of confluent hepatic fibrosis, portal hypertension with portal venous collaterals, massive paraesophageal varices and splenomegaly. 3. Numerous benign hepatic cysts. No worrisome early arterial phase enhancing lesions to suggest hepatoma. Electronically Signed   By: Marijo Sanes [name]   On: 01/17/2023 10:36     ASSESSMENT: Pancytopenia, iron deficiency anemia.  Left renal mass.  PLAN:    Pancytopenia: Multifactorial including cirrhosis and splenomegaly.  MRI on January 16, 2023 revealed stable splenomegaly.   Her white blood cell count has ranged from 2.0-4.0 since January 2019.  Today's result is 3.6.  Platelets have ranged from 52-113 over the same timeframe.  Today's result is 76.  She also has a persistently mild anemia.  She was previously noted to have antineutrophil antibodies as well as platelet antibodies but both are likely clinically insignificant given the stability of her laboratory work.  No intervention is needed at this time.  Patient does not require bone marrow biopsy.  Return to clinic in 6 months with repeat laboratory work and further evaluation. Left renal mass: MRI results from January 16, 2023 reviewed independently and reported as above with a stable 8 mm left renal mass highly suspicious for underlying renal cell carcinoma.  This has been unchanged in size since MRI dated April 02, 2020.  Continue to monitor on a yearly basis. Cirrhosis: Chronic and unchanged.  Continue follow-up with  GI as indicated. History of GI bleed: Patient denies any recent bleeding.  Patient's most recent EGD was on February 17, 2021. Iron deficiency anemia: Hemoglobin remains mildly decreased at 11.7, but iron stores remain within normal limits.  She last received IV Venofer on January 18, 2022.  Patient expressed understanding and was in agreement with this plan. She also understands that She can call clinic at any time with any questions, concerns, or complaints.    Lloyd Huger, MD   01/20/2023 3:33 PM

## 2023-01-20 ENCOUNTER — Encounter: Payer: Self-pay | Admitting: Oncology

## 2023-01-26 ENCOUNTER — Other Ambulatory Visit: Payer: Self-pay | Admitting: Internal Medicine

## 2023-01-26 DIAGNOSIS — K7031 Alcoholic cirrhosis of liver with ascites: Secondary | ICD-10-CM

## 2023-01-26 DIAGNOSIS — I8500 Esophageal varices without bleeding: Secondary | ICD-10-CM

## 2023-02-02 ENCOUNTER — Ambulatory Visit: Payer: Medicare Other | Attending: Internal Medicine

## 2023-02-15 ENCOUNTER — Other Ambulatory Visit: Payer: Federal, State, Local not specified - PPO

## 2023-02-20 ENCOUNTER — Ambulatory Visit
Admission: RE | Admit: 2023-02-20 | Discharge: 2023-02-20 | Disposition: A | Discharge status: Home / Self Care | Payer: Medicare Other | Source: Ambulatory Visit | Attending: Internal Medicine | Admitting: Internal Medicine

## 2023-02-20 DIAGNOSIS — K7031 Alcoholic cirrhosis of liver with ascites: Secondary | ICD-10-CM | POA: Diagnosis present

## 2023-02-20 DIAGNOSIS — K766 Portal hypertension: Secondary | ICD-10-CM | POA: Insufficient documentation

## 2023-02-20 DIAGNOSIS — I8500 Esophageal varices without bleeding: Secondary | ICD-10-CM

## 2023-05-03 ENCOUNTER — Encounter: Admission: RE | Payer: Self-pay | Source: Home / Self Care

## 2023-05-03 ENCOUNTER — Ambulatory Visit: Admission: RE | Admit: 2023-05-03 | Payer: Medicare Other | Source: Home / Self Care | Admitting: Internal Medicine

## 2023-05-03 SURGERY — COLONOSCOPY WITH PROPOFOL
Anesthesia: General

## 2023-07-07 ENCOUNTER — Other Ambulatory Visit: Payer: Self-pay | Admitting: Internal Medicine

## 2023-07-07 DIAGNOSIS — K7031 Alcoholic cirrhosis of liver with ascites: Secondary | ICD-10-CM

## 2023-07-07 DIAGNOSIS — I8500 Esophageal varices without bleeding: Secondary | ICD-10-CM

## 2023-07-24 ENCOUNTER — Inpatient Hospital Stay: Payer: Medicare Other | Attending: Oncology

## 2023-07-24 DIAGNOSIS — D61818 Other pancytopenia: Secondary | ICD-10-CM | POA: Insufficient documentation

## 2023-07-24 DIAGNOSIS — D509 Iron deficiency anemia, unspecified: Secondary | ICD-10-CM | POA: Diagnosis not present

## 2023-07-24 LAB — CBC WITH DIFFERENTIAL/PLATELET
Abs Immature Granulocytes: 0.01 10*3/uL (ref 0.00–0.07)
Basophils Absolute: 0 10*3/uL (ref 0.0–0.1)
Basophils Relative: 1 %
Eosinophils Absolute: 0.2 10*3/uL (ref 0.0–0.5)
Eosinophils Relative: 4 %
HCT: 32.1 % — ABNORMAL LOW (ref 36.0–46.0)
Hemoglobin: 11 g/dL — ABNORMAL LOW (ref 12.0–15.0)
Immature Granulocytes: 0 %
Lymphocytes Relative: 27 %
Lymphs Abs: 0.9 10*3/uL (ref 0.7–4.0)
MCH: 32 pg (ref 26.0–34.0)
MCHC: 34.3 g/dL (ref 30.0–36.0)
MCV: 93.3 fL (ref 80.0–100.0)
Monocytes Absolute: 0.5 10*3/uL (ref 0.1–1.0)
Monocytes Relative: 14 %
Neutro Abs: 1.8 10*3/uL (ref 1.7–7.7)
Neutrophils Relative %: 54 %
Platelets: 81 10*3/uL — ABNORMAL LOW (ref 150–400)
RBC: 3.44 MIL/uL — ABNORMAL LOW (ref 3.87–5.11)
RDW: 14.4 % (ref 11.5–15.5)
WBC: 3.4 10*3/uL — ABNORMAL LOW (ref 4.0–10.5)
nRBC: 0 % (ref 0.0–0.2)

## 2023-07-24 LAB — FERRITIN: Ferritin: 111 ng/mL (ref 11–307)

## 2023-07-25 ENCOUNTER — Encounter: Payer: Self-pay | Admitting: Oncology

## 2023-07-25 ENCOUNTER — Other Ambulatory Visit: Payer: Federal, State, Local not specified - PPO

## 2023-07-25 ENCOUNTER — Inpatient Hospital Stay: Payer: Medicare Other | Attending: Oncology | Admitting: Oncology

## 2023-07-25 VITALS — BP 138/68 | HR 71 | Temp 97.4°F | Resp 16 | Ht 64.0 in | Wt 178.0 lb

## 2023-07-25 DIAGNOSIS — K7460 Unspecified cirrhosis of liver: Secondary | ICD-10-CM | POA: Insufficient documentation

## 2023-07-25 DIAGNOSIS — D61818 Other pancytopenia: Secondary | ICD-10-CM | POA: Insufficient documentation

## 2023-07-25 DIAGNOSIS — R161 Splenomegaly, not elsewhere classified: Secondary | ICD-10-CM | POA: Diagnosis not present

## 2023-07-25 DIAGNOSIS — D509 Iron deficiency anemia, unspecified: Secondary | ICD-10-CM | POA: Insufficient documentation

## 2023-07-25 DIAGNOSIS — N2889 Other specified disorders of kidney and ureter: Secondary | ICD-10-CM | POA: Diagnosis not present

## 2023-07-25 NOTE — Progress Notes (Signed)
States she gets sharp pains in lower abdomen when she bends over.

## 2023-07-25 NOTE — Addendum Note (Signed)
Addended by: Luz Lex on: 07/25/2023 04:19 PM   Modules accepted: Orders

## 2023-07-25 NOTE — Progress Notes (Signed)
[facility]  Telephone:(336) [phone] Fax:(336) [phone]  ID: Novella Olive OB: [birth year]  MR#: [id number]  CSN#:[id number]  Patient Care Team: Jerl Mina, MD as PCP - General (Family Medicine) Jeralyn Ruths, MD as Consulting Physician (Oncology)  CHIEF COMPLAINT: Pancytopenia, iron deficiency anemia.  Left renal mass.  INTERVAL HISTORY: Patient returns to clinic today for repeat laboratory work and further evaluation.  She currently feels well and is asymptomatic.  She does not complain of any weakness or fatigue today.  She has no neurologic complaints.  She denies any recent fevers or illnesses.  She has a good appetite and denies weight loss.  She denies any chest pain, cough, hemoptysis, or shortness of breath.  She denies any abdominal pain. She denies any nausea, vomiting, constipation, or diarrhea.  She has no melena or hematochezia. She has no urinary complaints.  Patient offers no specific complaints today.  REVIEW OF SYSTEMS:   Review of Systems  Constitutional: Negative.  Negative for fever, malaise/fatigue and weight loss.  Respiratory: Negative.  Negative for cough, hemoptysis and shortness of breath.   Cardiovascular: Negative.  Negative for chest pain and leg swelling.  Gastrointestinal: Negative.  Negative for abdominal pain, blood in stool and melena.  Genitourinary: Negative.  Negative for dysuria and hematuria.  Musculoskeletal: Negative.  Negative for back pain.  Skin: Negative.  Negative for rash.  Neurological: Negative.  Negative for dizziness, sensory change, focal weakness, weakness and headaches.  Psychiatric/Behavioral: Negative.  The patient is not nervous/anxious.     As per HPI. Otherwise, a complete review of systems is negative.  PAST MEDICAL HISTORY: Past Medical History:  Diagnosis Date   Abdominal hernia    Allergic genetic state    Aortic ejection murmur    Ascites    Chronic hepatitis C (HCC)    Depression     Diabetes mellitus without complication (HCC)    Dysrhythmia    GERD (gastroesophageal reflux disease)    GIB (gastrointestinal bleeding)    Headache    Hepatic cirrhosis (HCC)    Hepatitis C    Hernia, abdominal    Hyperlipidemia    Hypertension    IDA (iron deficiency anemia)    Inflamed seborrheic keratosis    Interdigital neuroma of foot    Irregular heart rhythm    Liver disease    Multiple renal cysts    Palpitations    Pancytopenia (HCC)    Portal hypertension with esophageal varices (HCC)    Splenomegaly    UTI (urinary tract infection)     PAST SURGICAL HISTORY: Past Surgical History:  Procedure Laterality Date   ABDOMINAL HYSTERECTOMY     CHOLECYSTECTOMY     ESOPHAGOGASTRODUODENOSCOPY N/A 02/17/2021   Procedure: ESOPHAGOGASTRODUODENOSCOPY (EGD);  Surgeon: Toledo, Boykin Nearing, MD;  Location: [facility];  Service: Gastroenterology;  Laterality: N/A;   ESOPHAGOGASTRODUODENOSCOPY (EGD) WITH PROPOFOL N/A 11/23/2017   Procedure: ESOPHAGOGASTRODUODENOSCOPY (EGD) WITH PROPOFOL;  Surgeon: Toledo, Boykin Nearing, MD;  Location: [facility];  Service: Gastroenterology;  Laterality: N/A;   ESOPHAGOGASTRODUODENOSCOPY (EGD) WITH PROPOFOL N/A 10/23/2018   Procedure: ESOPHAGOGASTRODUODENOSCOPY (EGD) WITH PROPOFOL;  Surgeon: Toledo, Boykin Nearing, MD;  Location: [facility];  Service: Gastroenterology;  Laterality: N/A;   ESOPHAGOGASTRODUODENOSCOPY (EGD) WITH PROPOFOL N/A 10/10/2019   Procedure: ESOPHAGOGASTRODUODENOSCOPY (EGD) WITH PROPOFOL;  Surgeon: Toledo, Boykin Nearing, MD;  Location: [facility];  Service: Gastroenterology;  Laterality: N/A;   ESOPHAGOGASTRODUODENOSCOPY (EGD) WITH PROPOFOL N/A 11/14/2019   Procedure: ESOPHAGOGASTRODUODENOSCOPY (EGD) WITH PROPOFOL;  Surgeon: [name], Boykin Nearing, MD;  Location: [facility];  Service: Gastroenterology;  Laterality: N/A;   right elbow surgery     WISDOM TOOTH EXTRACTION      FAMILY HISTORY: Family History  Problem Relation Age of  Onset   Diabetes Mother    Heart attack Mother    Hypertension Father    Thyroid disease Sister    Sickle cell anemia Sister    Breast cancer Neg Hx     ADVANCED DIRECTIVES (Y/N):  N  HEALTH MAINTENANCE: Social History   Tobacco Use   Smoking status: Never   Smokeless tobacco: Never  Vaping Use   Vaping status: Never Used  Substance Use Topics   Alcohol use: Not Currently   Drug use: Yes    Types: Marijuana     Colonoscopy:  PAP:  Bone density:  Lipid panel:  Allergies  Allergen Reactions   Penicillins Anaphylaxis   Amlodipine Besylate Itching   Metronidazole Other (See Comments)    Patient states that she felt "foggy" and "loopy" after taking medication.   Diphenhydramine-Acetaminophen Other (See Comments)    Made my breast hurt.   Enalapril    Furosemide    Sulfa Antibiotics     Reaction unknown.   Tylenol [Acetaminophen] Other (See Comments)    Breast pain   Codeine Itching   Latex Rash    Current Outpatient Medications  Medication Sig Dispense Refill   bumetanide (BUMEX) 1 MG tablet Take 1 mg by mouth daily.     carvedilol (COREG) 3.125 MG tablet Take 3.125 mg by mouth 2 (two) times daily with a meal.     enalapril (VASOTEC) 2.5 MG tablet Take by mouth.     lactulose (CHRONULAC) 10 GM/15ML solution Take 45 mLs (30 g total) by mouth 2 (two) times daily. 240 mL 0   metFORMIN (GLUCOPHAGE) 1000 MG tablet Take 1,000 mg by mouth 2 (two) times daily.     omeprazole (PRILOSEC) 20 MG capsule      rifaximin (XIFAXAN) 550 MG TABS tablet Take by mouth.     spironolactone (ALDACTONE) 100 MG tablet Take 100 mg by mouth daily.     torsemide (DEMADEX) 10 MG tablet Take 1 tablet by mouth daily.     traZODone (DESYREL) 50 MG tablet Take 50 mg by mouth at bedtime.     No current facility-administered medications for this visit.    OBJECTIVE: Vitals:   07/25/23 1410  BP: 138/68  Pulse: 71  Resp: 16  Temp: (!) 97.4 F (36.3 C)  SpO2: 99%      Body mass index  is 30.55 kg/m.    ECOG FS:0 - Asymptomatic  General: Well-developed, well-nourished, no acute distress. Eyes: Pink conjunctiva, anicteric sclera. HEENT: Normocephalic, moist mucous membranes. Lungs: No audible wheezing or coughing. Heart: Regular rate and rhythm. Abdomen: Soft, nontender, no obvious distention. Musculoskeletal: No edema, cyanosis, or clubbing. Neuro: Alert, answering all questions appropriately. Cranial nerves grossly intact. Skin: No rashes or petechiae noted. Psych: Normal affect.  LAB RESULTS:  Lab Results  Component Value Date   NA 138 07/21/2018   K 4.1 07/21/2018   CL 112 (H) 07/21/2018   CO2 18 (L) 07/21/2018   GLUCOSE 111 (H) 07/21/2018   BUN 8 07/21/2018   CREATININE 0.66 07/21/2018   CALCIUM 8.6 (L) 07/21/2018   PROT 7.9 07/20/2018   ALBUMIN 3.6 07/20/2018   AST 48 (H) 07/20/2018   ALT 27 07/20/2018   ALKPHOS 91 07/20/2018   BILITOT 2.5 (H) 07/20/2018   GFRNONAA >60  07/21/2018   GFRAA >60 07/21/2018    Lab Results  Component Value Date   WBC 3.4 (L) 07/24/2023   NEUTROABS 1.8 07/24/2023   HGB 11.0 (L) 07/24/2023   HCT 32.1 (L) 07/24/2023   MCV 93.3 07/24/2023   PLT 81 (L) 07/24/2023   Lab Results  Component Value Date   IRON 79 08/08/2022   TIBC 351 08/08/2022   IRONPCTSAT 23 08/08/2022   Lab Results  Component Value Date   FERRITIN 111 07/24/2023     STUDIES: No results found.   ASSESSMENT: Pancytopenia, iron deficiency anemia.  Left renal mass.  PLAN:    Pancytopenia: Multifactorial including cirrhosis and splenomegaly.  MRI on January 16, 2023 revealed stable splenomegaly.  Her white blood cell count has ranged from 2.0-4.0 since January 2019.  Today's result is 3.4.  Platelets have ranged from 52-113 over the same timeframe.  Today's result is 81.  She also has a persistently mild anemia.  She was previously noted to have antineutrophil antibodies as well as platelet antibodies but both are likely clinically insignificant  given the stability of her laboratory work.  No intervention is needed at this time.  Patient does not require bone marrow biopsy.  Return to clinic in 6 months with repeat laboratory work and further evaluation. Left renal mass: MRI results from January 16, 2023 reviewed independently and reported as above with a stable 8 mm left renal mass highly suspicious for underlying renal cell carcinoma.  This has been unchanged in size since MRI dated April 02, 2020.  Repeat in March 2025.   Cirrhosis: Chronic and unchanged.  Continue follow-up with GI as indicated. History of GI bleed: Patient denies any recent bleeding.  Patient's most recent EGD was on February 17, 2021. Iron deficiency anemia: Remains mildly decreased, but stable.  She last received IV Venofer on January 18, 2022.  Patient expressed understanding and was in agreement with this plan. She also understands that She can call clinic at any time with any questions, concerns, or complaints.    Jeralyn Ruths, MD   07/25/2023 4:12 PM

## 2023-08-23 ENCOUNTER — Ambulatory Visit: Admission: RE | Admit: 2023-08-23 | Payer: Medicare Other | Source: Ambulatory Visit

## 2023-12-05 ENCOUNTER — Ambulatory Visit: Payer: Medicare Other | Admitting: Anesthesiology

## 2023-12-05 ENCOUNTER — Ambulatory Visit
Admission: RE | Admit: 2023-12-05 | Discharge: 2023-12-05 | Disposition: A | Discharge status: Home / Self Care | Payer: Medicare Other | Attending: Internal Medicine | Admitting: Internal Medicine

## 2023-12-05 ENCOUNTER — Encounter: Payer: Self-pay | Admitting: Internal Medicine

## 2023-12-05 ENCOUNTER — Encounter
Admission: RE | Disposition: A | Discharge status: Home / Self Care | Payer: Self-pay | Source: Home / Self Care | Attending: Internal Medicine

## 2023-12-05 DIAGNOSIS — K766 Portal hypertension: Secondary | ICD-10-CM | POA: Insufficient documentation

## 2023-12-05 DIAGNOSIS — Z1211 Encounter for screening for malignant neoplasm of colon: Secondary | ICD-10-CM | POA: Insufficient documentation

## 2023-12-05 DIAGNOSIS — B182 Chronic viral hepatitis C: Secondary | ICD-10-CM | POA: Diagnosis not present

## 2023-12-05 DIAGNOSIS — I10 Essential (primary) hypertension: Secondary | ICD-10-CM | POA: Insufficient documentation

## 2023-12-05 DIAGNOSIS — K641 Second degree hemorrhoids: Secondary | ICD-10-CM | POA: Diagnosis not present

## 2023-12-05 DIAGNOSIS — K3189 Other diseases of stomach and duodenum: Secondary | ICD-10-CM | POA: Insufficient documentation

## 2023-12-05 DIAGNOSIS — K219 Gastro-esophageal reflux disease without esophagitis: Secondary | ICD-10-CM | POA: Insufficient documentation

## 2023-12-05 DIAGNOSIS — I8510 Secondary esophageal varices without bleeding: Secondary | ICD-10-CM | POA: Insufficient documentation

## 2023-12-05 HISTORY — PX: ESOPHAGOGASTRODUODENOSCOPY (EGD) WITH PROPOFOL: SHX5813

## 2023-12-05 HISTORY — PX: COLONOSCOPY WITH PROPOFOL: SHX5780

## 2023-12-05 LAB — GLUCOSE, CAPILLARY: Glucose-Capillary: 112 mg/dL — ABNORMAL HIGH (ref 70–99)

## 2023-12-05 SURGERY — COLONOSCOPY WITH PROPOFOL
Anesthesia: General

## 2023-12-05 MED ORDER — LIDOCAINE HCL (PF) 2 % IJ SOLN
INTRAMUSCULAR | Status: AC
  Filled 2023-12-05: qty 5

## 2023-12-05 MED ORDER — PROPOFOL 500 MG/50ML IV EMUL
INTRAVENOUS | Status: DC | PRN
  Administered 2023-12-05: 120 ug/kg/min via INTRAVENOUS

## 2023-12-05 MED ORDER — LIDOCAINE HCL (CARDIAC) PF 100 MG/5ML IV SOSY
PREFILLED_SYRINGE | INTRAVENOUS | Status: DC | PRN
  Administered 2023-12-05: 50 mg via INTRAVENOUS

## 2023-12-05 MED ORDER — PHENYLEPHRINE 80 MCG/ML (10ML) SYRINGE FOR IV PUSH (FOR BLOOD PRESSURE SUPPORT)
PREFILLED_SYRINGE | INTRAVENOUS | Status: DC | PRN
  Administered 2023-12-05 (×2): 50 ug via INTRAVENOUS

## 2023-12-05 MED ORDER — PROPOFOL 10 MG/ML IV BOLUS
INTRAVENOUS | Status: DC | PRN
Start: 2023-12-05 — End: 2023-12-05
  Administered 2023-12-05: 50 mg via INTRAVENOUS

## 2023-12-05 MED ORDER — PROPOFOL 1000 MG/100ML IV EMUL
INTRAVENOUS | Status: AC
  Filled 2023-12-05: qty 100

## 2023-12-05 MED ORDER — SODIUM CHLORIDE 0.9 % IV SOLN: INTRAVENOUS | Status: DC

## 2023-12-05 MED ORDER — PHENYLEPHRINE 80 MCG/ML (10ML) SYRINGE FOR IV PUSH (FOR BLOOD PRESSURE SUPPORT)
PREFILLED_SYRINGE | INTRAVENOUS | Status: AC
  Filled 2023-12-05: qty 10

## 2023-12-05 NOTE — Op Note (Signed)
[facility] Gastroenterology Patient Name: Roberta Lane Procedure Date: 12/05/2023 10:16 AM MRN: [id number] Account #: [id number] Date of Birth: 1956/06/12 Admit Type: Outpatient Age: 68 Room: [facility] ENDO ROOM 1 Gender: Female Note Status: Finalized Instrument Name: Prentice Docker [id number] Procedure:             Colonoscopy Indications:           Screening for colorectal malignant neoplasm Providers:             Royce Macadamia K. [name] MD, MD Referring MD:          Rhona Leavens. Burnett Sheng, MD (Referring MD) Medicines:             Propofol per Anesthesia Complications:         No immediate complications. Estimated blood loss: None. Procedure:             Pre-Anesthesia Assessment:                        - The risks and benefits of the procedure and the                         sedation options and risks were discussed with the                         patient. All questions were answered and informed                         consent was obtained.                        - Patient identification and proposed procedure were                         verified prior to the procedure by the nurse. The                         procedure was verified in the procedure room.                        - ASA Grade Assessment: III - A patient with severe                         systemic disease.                        - After reviewing the risks and benefits, the patient                         was deemed in satisfactory condition to undergo the                         procedure.                        After obtaining informed consent, the colonoscope was                         passed under direct vision. Throughout the procedure,  the patient's blood pressure, pulse, and oxygen                         saturations were monitored continuously. The                         Colonoscope was introduced through the anus and                         advanced to the the cecum,  identified by appendiceal                         orifice and ileocecal valve. The colonoscopy was                         performed without difficulty. The patient tolerated                         the procedure well. The quality of the bowel                         preparation was good. The ileocecal valve, appendiceal                         orifice, and rectum were photographed. Findings:      The perianal and digital rectal examinations were normal. Pertinent       negatives include normal sphincter tone and no palpable rectal lesions.      Non-bleeding internal hemorrhoids were found during retroflexion. The       hemorrhoids were Grade II (internal hemorrhoids that prolapse but reduce       spontaneously).      The colon (entire examined portion) appeared normal. Impression:            - Non-bleeding internal hemorrhoids.                        - The entire examined colon is normal.                        - No specimens collected. Recommendation:        - Patient has a contact number available for                         emergencies. The signs and symptoms of potential                         delayed complications were discussed with the patient.                         Return to normal activities tomorrow. Written                         discharge instructions were provided to the patient.                        - Resume previous diet.                        - Continue present medications.                        -  You do NOT require further colon cancer screening                         measures (Annual stool testing (i.e. hemoccult, FIT,                         cologuard), sigmoidoscopy, colonoscopy or CT                         colonography). You should share this recommendation                         with your Primary Care provider.                        - Return to my office in 4 months.                        - The findings and recommendations were discussed with                          the patient.                        - Telephone GI office to schedule appointment. Procedure Code(s):     --- Professional ---                        N8295, Colorectal cancer screening; colonoscopy on                         individual not meeting criteria for high risk Diagnosis Code(s):     --- Professional ---                        K64.1, Second degree hemorrhoids                        Z12.11, Encounter for screening for malignant neoplasm                         of colon CPT copyright 2022 American Medical Association. All rights reserved. The codes documented in this report are preliminary and upon coder review may  be revised to meet current compliance requirements. Stanton Kidney MD, MD 12/05/2023 10:52:54 AM This report has been signed electronically. Number of Addenda: 0 Note Initiated On: 12/05/2023 10:16 AM Scope Withdrawal Time: 0 hours 6 minutes 4 seconds  Total Procedure Duration: 0 hours 9 minutes 17 seconds  Estimated Blood Loss:  Estimated blood loss: none.      [facility]

## 2023-12-05 NOTE — Anesthesia Postprocedure Evaluation (Signed)
Anesthesia Post Note  Patient: Roberta Lane  Procedure(s) Performed: COLONOSCOPY WITH PROPOFOL ESOPHAGOGASTRODUODENOSCOPY (EGD) WITH PROPOFOL  Patient location during evaluation: Endoscopy Anesthesia Type: General Level of consciousness: awake and alert Pain management: pain level controlled Vital Signs Assessment: post-procedure vital signs reviewed and stable Respiratory status: spontaneous breathing, nonlabored ventilation, respiratory function stable and patient connected to nasal cannula oxygen Cardiovascular status: blood pressure returned to baseline and stable Postop Assessment: no apparent nausea or vomiting Anesthetic complications: no   There were no known notable events for this encounter.   Last Vitals:  Vitals:   12/05/23 1103 12/05/23 1110  BP: 121/75 (!) 141/84  Pulse: 67 67  Resp: 17   Temp:    SpO2:      Last Pain:  Vitals:   12/05/23 1102  TempSrc: Temporal  PainSc:                  Cleda Mccreedy Leonard Hendler

## 2023-12-05 NOTE — Anesthesia Preprocedure Evaluation (Signed)
Anesthesia Evaluation  Patient identified by MRN, date of birth, ID band Patient awake    Reviewed: Allergy & Precautions, NPO status , Patient's Chart, lab work & pertinent test results  History of Anesthesia Complications Negative for: history of anesthetic complications  Airway Mallampati: III  TM Distance: <3 FB Neck ROM: full    Dental  (+) Chipped, Poor Dentition, Missing   Pulmonary neg pulmonary ROS, neg shortness of breath   Pulmonary exam normal        Cardiovascular Exercise Tolerance: Good hypertension, Normal cardiovascular exam+ dysrhythmias      Neuro/Psych  Headaches  negative psych ROS   GI/Hepatic ,GERD  ,,(+) Hepatitis -  Endo/Other  negative endocrine ROSdiabetes    Renal/GU Renal disease  negative genitourinary   Musculoskeletal   Abdominal   Peds  Hematology negative hematology ROS (+)   Anesthesia Other Findings Past Medical History: No date: Abdominal hernia No date: Allergic genetic state No date: Aortic ejection murmur No date: Ascites No date: Chronic hepatitis C (HCC) No date: Depression No date: Diabetes mellitus without complication (HCC) No date: Dysrhythmia No date: GERD (gastroesophageal reflux disease) No date: GIB (gastrointestinal bleeding) No date: Headache No date: Hepatic cirrhosis (HCC) No date: Hepatitis C No date: Hernia, abdominal No date: Hyperlipidemia No date: Hypertension No date: IDA (iron deficiency anemia) No date: Inflamed seborrheic keratosis No date: Interdigital neuroma of foot No date: Irregular heart rhythm No date: Liver disease No date: Multiple renal cysts No date: Palpitations No date: Pancytopenia (HCC) No date: Portal hypertension with esophageal varices (HCC) No date: Splenomegaly No date: UTI (urinary tract infection)  Past Surgical History: No date: ABDOMINAL HYSTERECTOMY No date: CHOLECYSTECTOMY 02/17/2021:  ESOPHAGOGASTRODUODENOSCOPY; N/A     Comment:  Procedure: ESOPHAGOGASTRODUODENOSCOPY (EGD);  Surgeon:               [place], Boykin Nearing, MD;  Location: [facility] ENDOSCOPY;                Service: Gastroenterology;  Laterality: N/A; 11/23/2017: ESOPHAGOGASTRODUODENOSCOPY (EGD) WITH PROPOFOL; N/A     Comment:  Procedure: ESOPHAGOGASTRODUODENOSCOPY (EGD) WITH               PROPOFOL;  Surgeon: Toledo, Boykin Nearing, MD;  Location:               [facility] ENDOSCOPY;  Service: Gastroenterology;  Laterality:               N/A; 10/23/2018: ESOPHAGOGASTRODUODENOSCOPY (EGD) WITH PROPOFOL; N/A     Comment:  Procedure: ESOPHAGOGASTRODUODENOSCOPY (EGD) WITH               PROPOFOL;  Surgeon: Toledo, Boykin Nearing, MD;  Location:               [facility] ENDOSCOPY;  Service: Gastroenterology;  Laterality:               N/A; 10/10/2019: ESOPHAGOGASTRODUODENOSCOPY (EGD) WITH PROPOFOL; N/A     Comment:  Procedure: ESOPHAGOGASTRODUODENOSCOPY (EGD) WITH               PROPOFOL;  Surgeon: Toledo, Boykin Nearing, MD;  Location:               [facility] ENDOSCOPY;  Service: Gastroenterology;  Laterality:               N/A; 11/14/2019: ESOPHAGOGASTRODUODENOSCOPY (EGD) WITH PROPOFOL; N/A     Comment:  Procedure: ESOPHAGOGASTRODUODENOSCOPY (EGD) WITH  PROPOFOL;  Surgeon: Toledo, Boykin Nearing, MD;  Location:               [facility] ENDOSCOPY;  Service: Gastroenterology;  Laterality:               N/A; No date: right elbow surgery No date: WISDOM TOOTH EXTRACTION  BMI    Body Mass Index: 28.84 kg/m      Reproductive/Obstetrics negative OB ROS                             Anesthesia Physical Anesthesia Plan  ASA: 3  Anesthesia Plan: General   Post-op Pain Management:    Induction: Intravenous  PONV Risk Score and Plan: Propofol infusion and TIVA  Airway Management Planned: Natural Airway and Nasal Cannula  Additional Equipment:   Intra-op Plan:   Post-operative Plan:   Informed Consent: I have  reviewed the patients History and Physical, chart, labs and discussed the procedure including the risks, benefits and alternatives for the proposed anesthesia with the patient or authorized representative who has indicated his/her understanding and acceptance.     Dental Advisory Given  Plan Discussed with: Anesthesiologist, CRNA and Surgeon  Anesthesia Plan Comments: (Patient consented for risks of anesthesia including but not limited to:  - adverse reactions to medications - risk of airway placement if required - damage to eyes, teeth, lips or other oral mucosa - nerve damage due to positioning  - sore throat or hoarseness - Damage to heart, brain, nerves, lungs, other parts of body or loss of life  Patient voiced understanding and assent.)       Anesthesia Quick Evaluation

## 2023-12-05 NOTE — Interval H&P Note (Signed)
History and Physical Interval Note:  12/05/2023 10:15 AM  Roberta Lane  has presented today for surgery, with the diagnosis of K76.6, I85.00 (ICD-10-CM) - Portal hypertension with esophageal varices  Z12.11 (ICD-10-CM) - Colon cancer screening K74.60, I85.10 (ICD-10-CM) - Esophageal varices in cirrhosis.  The various methods of treatment have been discussed with the patient and family. After consideration of risks, benefits and other options for treatment, the patient has consented to  Procedure(s) with comments: COLONOSCOPY WITH PROPOFOL (N/A) - DM ESOPHAGOGASTRODUODENOSCOPY (EGD) WITH PROPOFOL (N/A) as a surgical intervention.  The patient's history has been reviewed, patient examined, no change in status, stable for surgery.  I have reviewed the patient's chart and labs.  Questions were answered to the patient's satisfaction.     [place], [place]

## 2023-12-05 NOTE — H&P (Signed)
Outpatient short stay form Pre-procedure 12/05/2023 10:13 AM Roberta Lane Roberta Lane, [name]  Primary Physician: Roberta Lane, [name]  Reason for visit:  Colon cancer screening, Esophageal varices, Portal hypertension.  History of present illness:  Roberta Lane is established with [facility] for the management of the above. All prior office notes, lab results, imaging studies, and procedure reports reviewed as appropriate.  Summary of evaluation:  - Labs: - CSY: 2015-[place]- "normal" - EGD: 10/23/2018-Dr. Gari Hartsell-grade 2 esophageal varices with mild portal hypertensive gastropathy. No stigmata of bleeding. - EGD: 10/10/2019-Dr. Fayrene Towner-Grade III esophageal varices with three bands placed with complete eradication, resulting in deflation of varices. No bleeding during and at end of the procedure. Moderate portal hypertensive gastropathy. Normal duodenum.  - EGD: 11/14/2019 - Grade I varices found in lower third of esophagus, banding not deemed necessary. Size markedly decreased since last EGD 10/10/2019. Portal hypertensive gastropathy. Congested duodenal mucosa. Antral biopsies showing chronic active H pylori positive gastritis.  -EGD 02/17/21: Grade I esophageal varices, mild portal hypertensive gastropathy, no gastric varices.     Current Facility-Administered Medications:    0.9 %  sodium chloride infusion, , Intravenous, Continuous, [place], Boykin Nearing, MD, Last Rate: 20 mL/hr at 12/05/23 1013, New Bag at 12/05/23 1013  Medications Prior to Admission  Medication Sig Dispense Refill Last Dose/Taking   bumetanide (BUMEX) 1 MG tablet Take 1 mg by mouth daily.   12/03/2023   carvedilol (COREG) 3.125 MG tablet Take 3.125 mg by mouth 2 (two) times daily with a meal.   12/03/2023   enalapril (VASOTEC) 2.5 MG tablet Take by mouth.   12/03/2023   lactulose (CHRONULAC) 10 GM/15ML solution Take 45 mLs (30 g total) by mouth 2 (two) times daily. 240 mL 0    metFORMIN (GLUCOPHAGE) 1000  MG tablet Take 1,000 mg by mouth 2 (two) times daily.   12/03/2023   omeprazole (PRILOSEC) 20 MG capsule       rifaximin (XIFAXAN) 550 MG TABS tablet Take by mouth.      spironolactone (ALDACTONE) 100 MG tablet Take 100 mg by mouth daily.   12/03/2023   torsemide (DEMADEX) 10 MG tablet Take 1 tablet by mouth daily.   12/03/2023   traZODone (DESYREL) 50 MG tablet Take 50 mg by mouth at bedtime.   12/03/2023     Allergies  Allergen Reactions   Penicillins Anaphylaxis   Amlodipine Besylate Itching   Metronidazole Other (See Comments)    Patient states that she felt "foggy" and "loopy" after taking medication.   Diphenhydramine-Acetaminophen Other (See Comments)    Made my breast hurt.   Enalapril    Furosemide    Sulfa Antibiotics     Reaction unknown.   Tylenol [Acetaminophen] Other (See Comments)    Breast pain   Codeine Itching   Latex Rash     Past Medical History:  Diagnosis Date   Abdominal hernia    Allergic genetic state    Aortic ejection murmur    Ascites    Chronic hepatitis C (HCC)    Depression    Diabetes mellitus without complication (HCC)    Dysrhythmia    GERD (gastroesophageal reflux disease)    GIB (gastrointestinal bleeding)    Headache    Hepatic cirrhosis (HCC)    Hepatitis C    Hernia, abdominal    Hyperlipidemia    Hypertension    IDA (iron deficiency anemia)    Inflamed seborrheic keratosis    Interdigital neuroma of foot  Irregular heart rhythm    Liver disease    Multiple renal cysts    Palpitations    Pancytopenia (HCC)    Portal hypertension with esophageal varices (HCC)    Splenomegaly    UTI (urinary tract infection)     Review of systems:  Otherwise negative.    Physical Exam  Gen: Alert, oriented. Appears stated age.  HEENT: [place]/AT. PERRLA. Lungs: CTA, no wheezes. CV: RR nl S1, S2. Abd: soft, benign, no masses. BS+ Ext: No edema. Pulses 2+    Planned procedures: Proceed with EGD and colonoscopy. The patient understands  the nature of the planned procedure, indications, risks, alternatives and potential complications including but not limited to bleeding, infection, perforation, damage to internal organs and possible oversedation/side effects from anesthesia. The patient agrees and gives consent to proceed.  Please refer to procedure notes for findings, recommendations and patient disposition/instructions.     Ryland Smoots Roberta Lane, [name] Gastroenterology 12/05/2023  10:13 AM

## 2023-12-05 NOTE — Op Note (Signed)
[facility] Gastroenterology Patient Name: Roberta Lane Procedure Date: 12/05/2023 10:17 AM MRN: [id number] Account #: [id number] Date of Birth: 1956-06-10 Admit Type: Outpatient Age: 68 Room: [facility] ENDO ROOM 1 Gender: Female Note Status: Finalized Instrument Name: Upper Endoscope [id number] Procedure:             Upper GI endoscopy Indications:           Follow-up of esophageal varices, Portal venous                         hypertension Providers:             Boykin Nearing. [name] MD, MD Referring MD:          Rhona Leavens. Burnett Sheng, MD (Referring MD) Medicines:             Propofol per Anesthesia Complications:         No immediate complications. Estimated blood loss: None. Procedure:             Pre-Anesthesia Assessment:                        - The risks and benefits of the procedure and the                         sedation options and risks were discussed with the                         patient. All questions were answered and informed                         consent was obtained.                        - Patient identification and proposed procedure were                         verified prior to the procedure by the nurse. The                         procedure was verified in the procedure room.                        - ASA Grade Assessment: III - A patient with severe                         systemic disease.                        - After reviewing the risks and benefits, the patient                         was deemed in satisfactory condition to undergo the                         procedure.                        After obtaining informed consent, the endoscope was  passed under direct vision. Throughout the procedure,                         the patient's blood pressure, pulse, and oxygen                         saturations were monitored continuously. The Endoscope                         was introduced through the mouth, and advanced  to the                         third part of duodenum. The upper GI endoscopy was                         accomplished without difficulty. The patient tolerated                         the procedure well. Findings:      Grade I varices were found in the middle third of the esophagus and in       the lower third of the esophagus. Estimated blood loss: none.      The exam of the esophagus was otherwise normal.      Mild portal hypertensive gastropathy was found in the entire examined       stomach.      The exam of the stomach was otherwise normal.      The examined duodenum was normal. Impression:            - Grade I esophageal varices.                        - Portal hypertensive gastropathy.                        - Normal examined duodenum.                        - No specimens collected. Recommendation:        - Repeat upper endoscopy in 2 years for surveillance.                        - Proceed with colonoscopy Procedure Code(s):     --- Professional ---                        [phone], Esophagogastroduodenoscopy, flexible,                         transoral; diagnostic, including collection of                         specimen(s) by brushing or washing, when performed                         (separate procedure) Diagnosis Code(s):     --- Professional ---                        K31.89, Other diseases of stomach and duodenum  K76.6, Portal hypertension                        I85.00, Esophageal varices without bleeding CPT copyright 2022 American Medical Association. All rights reserved. The codes documented in this report are preliminary and upon coder review may  be revised to meet current compliance requirements. Stanton Kidney MD, MD 12/05/2023 10:39:07 AM This report has been signed electronically. Number of Addenda: 0 Note Initiated On: 12/05/2023 10:17 AM Estimated Blood Loss:  Estimated blood loss: none.      [facility]

## 2023-12-05 NOTE — Transfer of Care (Signed)
Immediate Anesthesia Transfer of Care Note  Patient: Roberta Lane  Procedure(s) Performed: COLONOSCOPY WITH PROPOFOL ESOPHAGOGASTRODUODENOSCOPY (EGD) WITH PROPOFOL  Patient Location: PACU  Anesthesia Type:General  Level of Consciousness: awake and sedated  Airway & Oxygen Therapy: Patient Spontanous Breathing and Patient connected to nasal cannula oxygen  Post-op Assessment: Report given to RN and Post -op Vital signs reviewed and stable  Post vital signs: Reviewed and stable  Last Vitals:  Vitals Value Taken Time  BP    Temp    Pulse    Resp    SpO2      Last Pain:  Vitals:   12/05/23 0945  TempSrc: Temporal  PainSc: 0-No pain         Complications: There were no known notable events for this encounter.

## 2023-12-06 ENCOUNTER — Encounter: Payer: Self-pay | Admitting: Internal Medicine

## 2024-01-16 ENCOUNTER — Ambulatory Visit: Admission: RE | Admit: 2024-01-16 | Payer: Federal, State, Local not specified - PPO | Source: Ambulatory Visit

## 2024-01-23 ENCOUNTER — Inpatient Hospital Stay: Payer: Federal, State, Local not specified - PPO

## 2024-01-23 ENCOUNTER — Inpatient Hospital Stay: Payer: Federal, State, Local not specified - PPO | Admitting: Oncology

## 2024-01-29 ENCOUNTER — Encounter: Payer: Self-pay | Admitting: Oncology

## 2024-01-29 ENCOUNTER — Inpatient Hospital Stay: Attending: Oncology

## 2024-01-29 ENCOUNTER — Inpatient Hospital Stay (HOSPITAL_BASED_OUTPATIENT_CLINIC_OR_DEPARTMENT_OTHER): Admitting: Oncology

## 2024-01-29 VITALS — BP 133/71 | HR 68 | Temp 98.6°F | Resp 16 | Ht 64.0 in | Wt 178.6 lb

## 2024-01-29 DIAGNOSIS — D61818 Other pancytopenia: Secondary | ICD-10-CM | POA: Insufficient documentation

## 2024-01-29 DIAGNOSIS — N2889 Other specified disorders of kidney and ureter: Secondary | ICD-10-CM | POA: Diagnosis not present

## 2024-01-29 DIAGNOSIS — K7460 Unspecified cirrhosis of liver: Secondary | ICD-10-CM | POA: Diagnosis not present

## 2024-01-29 DIAGNOSIS — D509 Iron deficiency anemia, unspecified: Secondary | ICD-10-CM | POA: Diagnosis present

## 2024-01-29 LAB — CBC WITH DIFFERENTIAL/PLATELET
Abs Immature Granulocytes: 0.01 10*3/uL (ref 0.00–0.07)
Basophils Absolute: 0 10*3/uL (ref 0.0–0.1)
Basophils Relative: 1 %
Eosinophils Absolute: 0.1 10*3/uL (ref 0.0–0.5)
Eosinophils Relative: 3 %
HCT: 32.3 % — ABNORMAL LOW (ref 36.0–46.0)
Hemoglobin: 11 g/dL — ABNORMAL LOW (ref 12.0–15.0)
Immature Granulocytes: 0 %
Lymphocytes Relative: 21 %
Lymphs Abs: 0.7 10*3/uL (ref 0.7–4.0)
MCH: 31.7 pg (ref 26.0–34.0)
MCHC: 34.1 g/dL (ref 30.0–36.0)
MCV: 93.1 fL (ref 80.0–100.0)
Monocytes Absolute: 0.3 10*3/uL (ref 0.1–1.0)
Monocytes Relative: 9 %
Neutro Abs: 2.1 10*3/uL (ref 1.7–7.7)
Neutrophils Relative %: 66 %
Platelets: 78 10*3/uL — ABNORMAL LOW (ref 150–400)
RBC: 3.47 MIL/uL — ABNORMAL LOW (ref 3.87–5.11)
RDW: 14.3 % (ref 11.5–15.5)
WBC: 3.2 10*3/uL — ABNORMAL LOW (ref 4.0–10.5)
nRBC: 0 % (ref 0.0–0.2)

## 2024-01-29 NOTE — Progress Notes (Signed)
 [facility]  [phone] Fax:(336) [phone]  ID: Roberta Lane OB: November 26, 1955  MR#: [id number]  CSN#:[id number]  Patient Care Team: Jerl Mina, MD as PCP - General (Family Medicine) Jeralyn Ruths, MD as Consulting Physician (Oncology)  CHIEF COMPLAINT: Pancytopenia, iron deficiency anemia.  Left renal mass.  INTERVAL HISTORY: Patient returns to clinic today for repeat laboratory work and routine 15-month evaluation.  She continues to feel well and remains asymptomatic.  She does not complain of any weakness or fatigue today.  She has no neurologic complaints.  She denies any recent fevers or illnesses.  She has a good appetite and denies weight loss.  She denies any chest pain, cough, hemoptysis, or shortness of breath.  She denies any abdominal pain. She denies any nausea, vomiting, constipation, or diarrhea.  She has no melena or hematochezia. She has no urinary complaints.  Patient offers no specific complaints today.  REVIEW OF SYSTEMS:   Review of Systems  Constitutional: Negative.  Negative for fever, malaise/fatigue and weight loss.  Respiratory: Negative.  Negative for cough, hemoptysis and shortness of breath.   Cardiovascular: Negative.  Negative for chest pain and leg swelling.  Gastrointestinal: Negative.  Negative for abdominal pain, blood in stool and melena.  Genitourinary: Negative.  Negative for dysuria and hematuria.  Musculoskeletal: Negative.  Negative for back pain.  Skin: Negative.  Negative for rash.  Neurological: Negative.  Negative for dizziness, sensory change, focal weakness, weakness and headaches.  Psychiatric/Behavioral: Negative.  The patient is not nervous/anxious.     As per HPI. Otherwise, a complete review of systems is negative.  PAST MEDICAL HISTORY: Past Medical History:  Diagnosis Date   Abdominal hernia    Allergic genetic state    Aortic ejection murmur    Ascites    Chronic hepatitis C (HCC)     Depression    Diabetes mellitus without complication (HCC)    Dysrhythmia    GERD (gastroesophageal reflux disease)    GIB (gastrointestinal bleeding)    Headache    Hepatic cirrhosis (HCC)    Hepatitis C    Hernia, abdominal    Hyperlipidemia    Hypertension    IDA (iron deficiency anemia)    Inflamed seborrheic keratosis    Interdigital neuroma of foot    Irregular heart rhythm    Liver disease    Multiple renal cysts    Palpitations    Pancytopenia (HCC)    Portal hypertension with esophageal varices (HCC)    Splenomegaly    UTI (urinary tract infection)     PAST SURGICAL HISTORY: Past Surgical History:  Procedure Laterality Date   ABDOMINAL HYSTERECTOMY     CHOLECYSTECTOMY     COLONOSCOPY WITH PROPOFOL N/A 12/05/2023   Procedure: COLONOSCOPY WITH PROPOFOL;  Surgeon: Toledo, Boykin Nearing, MD;  Location: [facility] ENDOSCOPY;  Service: Gastroenterology;  Laterality: N/A;  DM   ESOPHAGOGASTRODUODENOSCOPY N/A 02/17/2021   Procedure: ESOPHAGOGASTRODUODENOSCOPY (EGD);  Surgeon: Toledo, Boykin Nearing, MD;  Location: [facility] ENDOSCOPY;  Service: Gastroenterology;  Laterality: N/A;   ESOPHAGOGASTRODUODENOSCOPY (EGD) WITH PROPOFOL N/A 11/23/2017   Procedure: ESOPHAGOGASTRODUODENOSCOPY (EGD) WITH PROPOFOL;  Surgeon: Toledo, Boykin Nearing, MD;  Location: [facility];  Service: Gastroenterology;  Laterality: N/A;   ESOPHAGOGASTRODUODENOSCOPY (EGD) WITH PROPOFOL N/A 10/23/2018   Procedure: ESOPHAGOGASTRODUODENOSCOPY (EGD) WITH PROPOFOL;  Surgeon: Toledo, Boykin Nearing, MD;  Location: [facility];  Service: Gastroenterology;  Laterality: N/A;   ESOPHAGOGASTRODUODENOSCOPY (EGD) WITH PROPOFOL N/A 10/10/2019   Procedure: ESOPHAGOGASTRODUODENOSCOPY (EGD) WITH PROPOFOL;  Surgeon: Fult[place], [place]  K, MD;  Location: [facility];  Service: Gastroenterology;  Laterality: N/A;   ESOPHAGOGASTRODUODENOSCOPY (EGD) WITH PROPOFOL N/A 11/14/2019   Procedure: ESOPHAGOGASTRODUODENOSCOPY (EGD) WITH PROPOFOL;  Surgeon:  Toledo, Boykin Nearing, MD;  Location: [facility];  Service: Gastroenterology;  Laterality: N/A;   ESOPHAGOGASTRODUODENOSCOPY (EGD) WITH PROPOFOL N/A 12/05/2023   Procedure: ESOPHAGOGASTRODUODENOSCOPY (EGD) WITH PROPOFOL;  Surgeon: Toledo, Boykin Nearing, MD;  Location: [facility];  Service: Gastroenterology;  Laterality: N/A;   right elbow surgery     WISDOM TOOTH EXTRACTION      FAMILY HISTORY: Family History  Problem Relation Age of Onset   Diabetes Mother    Heart attack Mother    Hypertension Father    Thyroid disease Sister    Sickle cell anemia Sister    Breast cancer Neg Hx     ADVANCED DIRECTIVES (Y/N):  N  HEALTH MAINTENANCE: Social History   Tobacco Use   Smoking status: Never   Smokeless tobacco: Never  Vaping Use   Vaping status: Never Used  Substance Use Topics   Alcohol use: Not Currently   Drug use: Yes    Types: Marijuana     Colonoscopy:  PAP:  Bone density:  Lipid panel:  Allergies  Allergen Reactions   Penicillins Anaphylaxis   Amlodipine Besylate Itching   Metronidazole Other (See Comments)    Patient states that she felt "foggy" and "loopy" after taking medication.   Diphenhydramine-Acetaminophen Other (See Comments)    Made my breast hurt.   Enalapril    Furosemide    Sulfa Antibiotics     Reaction unknown.   Tylenol [Acetaminophen] Other (See Comments)    Breast pain   Codeine Itching   Latex Rash    Current Outpatient Medications  Medication Sig Dispense Refill   bumetanide (BUMEX) 1 MG tablet Take 1 mg by mouth daily.     carvedilol (COREG) 3.125 MG tablet Take 3.125 mg by mouth 2 (two) times daily with a meal.     enalapril (VASOTEC) 2.5 MG tablet Take by mouth.     lactulose (CHRONULAC) 10 GM/15ML solution Take 45 mLs (30 g total) by mouth 2 (two) times daily. 240 mL 0   metFORMIN (GLUCOPHAGE) 1000 MG tablet Take 1,000 mg by mouth 2 (two) times daily.     omeprazole (PRILOSEC) 20 MG capsule      rifaximin (XIFAXAN) 550 MG TABS  tablet Take by mouth.     spironolactone (ALDACTONE) 100 MG tablet Take 100 mg by mouth daily.     torsemide (DEMADEX) 10 MG tablet Take 1 tablet by mouth daily.     traZODone (DESYREL) 50 MG tablet Take 50 mg by mouth at bedtime.     No current facility-administered medications for this visit.    OBJECTIVE: Vitals:   01/29/24 1034  BP: 133/71  Pulse: 68  Resp: 16  Temp: 98.6 F (37 C)  SpO2: 97%      Body mass index is 30.66 kg/m.    ECOG FS:0 - Asymptomatic  General: Well-developed, well-nourished, no acute distress. Eyes: Pink conjunctiva, anicteric sclera. HEENT: Normocephalic, moist mucous membranes. Lungs: No audible wheezing or coughing. Heart: Regular rate and rhythm. Abdomen: Soft, nontender, no obvious distention. Musculoskeletal: No edema, cyanosis, or clubbing. Neuro: Alert, answering all questions appropriately. Cranial nerves grossly intact. Skin: No rashes or petechiae noted. Psych: Normal affect.  LAB RESULTS:  Lab Results  Component Value Date   NA 138 07/21/2018   K 4.1 07/21/2018   CL 112 (H) 07/21/2018  CO2 18 (L) 07/21/2018   GLUCOSE 111 (H) 07/21/2018   BUN 8 07/21/2018   CREATININE 0.66 07/21/2018   CALCIUM 8.6 (L) 07/21/2018   PROT 7.9 07/20/2018   ALBUMIN 3.6 07/20/2018   AST 48 (H) 07/20/2018   ALT 27 07/20/2018   ALKPHOS 91 07/20/2018   BILITOT 2.5 (H) 07/20/2018   GFRNONAA >60 07/21/2018   GFRAA >60 07/21/2018    Lab Results  Component Value Date   WBC 3.2 (L) 01/29/2024   NEUTROABS 2.1 01/29/2024   HGB 11.0 (L) 01/29/2024   HCT 32.3 (L) 01/29/2024   MCV 93.1 01/29/2024   PLT 78 (L) 01/29/2024   Lab Results  Component Value Date   IRON 79 08/08/2022   TIBC 351 08/08/2022   IRONPCTSAT 23 08/08/2022   Lab Results  Component Value Date   FERRITIN 111 07/24/2023     STUDIES: No results found.   ASSESSMENT: Pancytopenia, iron deficiency anemia.  Left renal mass.  PLAN:    Pancytopenia: Multifactorial including  cirrhosis and splenomegaly.  MRI on January 16, 2023 revealed stable splenomegaly.  Her white blood cell count has ranged from 2.0-4.0 since January 2019.  Today's result is 3.2.  Platelets have ranged from 52-113 over the same timeframe.  Today's result is 72.  She also has a persistently mild anemia.  She was previously noted to have antineutrophil antibodies as well as platelet antibodies but both are likely clinically insignificant given the stability of her laboratory work.  No intervention is needed at this time.  Patient does not require bone marrow biopsy.  Return to in 6 months with repeat laboratory work and further evaluation.   Iron deficiency anemia: Mild.  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  Patient's hemoglobin remained stable at 11.0.  Her last luminal evaluation was on February 17, 2021.  She last received IV Venofer on January 18, 2022.  No intervention needed. Left renal mass: MRI results from January 16, 2023 reviewed independently with a stable 8 mm left renal mass highly suspicious for underlying renal cell carcinoma.  This has been unchanged in size since MRI dated April 02, 2020.  Repeat MRI in the next 1 to 2 weeks.   Cirrhosis: Chronic and unchanged.  Continue follow-up with GI as indicated.   Patient expressed understanding and was in agreement with this plan. She also understands that She can call clinic at any time with any questions, concerns, or complaints.    Jeralyn Ruths, MD   01/29/2024 12:24 PM

## 2024-01-31 LAB — FERRITIN: Ferritin: 46 ng/mL (ref 11–307)

## 2024-02-06 ENCOUNTER — Ambulatory Visit
Admission: RE | Admit: 2024-02-06 | Discharge: 2024-02-06 | Disposition: A | Discharge status: Home / Self Care | Source: Ambulatory Visit | Attending: Oncology | Admitting: Oncology

## 2024-02-06 DIAGNOSIS — N2889 Other specified disorders of kidney and ureter: Secondary | ICD-10-CM | POA: Insufficient documentation

## 2024-02-06 MED ORDER — GADOBUTROL 1 MMOL/ML IV SOLN
7.5000 mL | Freq: Once | INTRAVENOUS | Status: AC | PRN
  Administered 2024-02-06: 7.5 mL via INTRAVENOUS

## 2024-02-20 ENCOUNTER — Telehealth: Payer: Self-pay

## 2024-02-20 NOTE — Telephone Encounter (Signed)
 Patient called office about reaction to possible hair dye or shampoo and now having flare/break out on scalp. Advised patient we can not give certain medication or treatment by phone but she could try OTC Hydrocortisone, a light layer, until appt next week. aw

## 2024-02-28 ENCOUNTER — Ambulatory Visit: Admitting: Dermatology

## 2024-02-28 DIAGNOSIS — Z7189 Other specified counseling: Secondary | ICD-10-CM | POA: Diagnosis not present

## 2024-02-28 DIAGNOSIS — Z79899 Other long term (current) drug therapy: Secondary | ICD-10-CM

## 2024-02-28 DIAGNOSIS — L309 Dermatitis, unspecified: Secondary | ICD-10-CM

## 2024-02-28 MED ORDER — HYDROCORTISONE 2.5 % EX OINT: TOPICAL_OINTMENT | Freq: Two times a day (BID) | CUTANEOUS | 2 refills | Status: AC

## 2024-02-28 MED ORDER — CLOBETASOL PROPIONATE 0.05 % EX CREA: 1.0000 | TOPICAL_CREAM | Freq: Two times a day (BID) | CUTANEOUS | 2 refills | Status: AC

## 2024-02-28 MED ORDER — CLOBETASOL PROPIONATE 0.05 % EX SOLN: 1.0000 | Freq: Two times a day (BID) | CUTANEOUS | 0 refills | Status: DC

## 2024-02-28 NOTE — Progress Notes (Signed)
   Follow-Up Visit   Subjective  Roberta Lane is a 68 y.o. female who presents for the following: patient c/o itching at left scalp last week, felt like whelps were on scalp. Patient has been using tea tree oil shampoo and eases itching, patient reports itching severe now has spread to lower back. Patient states she did use some new honey almond shampoo prior to itching starting. Patient has been using cortisone at hairline and calamine lotion at back. Flares more in the evenings and at night.    The following portions of the chart were reviewed this encounter and updated as appropriate: medications, allergies, medical history  Review of Systems:  No other skin or systemic complaints except as noted in HPI or Assessment and Plan.  Objective  Well appearing patient in no apparent distress; mood and affect are within normal limits.  A focused examination was performed of the following areas: Scalp, neck, back  Relevant exam findings are noted in the Assessment and Plan.    Assessment & Plan   Dermatitis- atopic vs contact with ID rxn Exam: Light pink patch at L occipital hairline, hyperpigmented scaly patches at posterior neck, pink patch at spinal lower back and left lower back, lichenified hyperpigmented patch at gluteal crease.   Chronic and persistent condition with duration or expected duration over one year. Condition is bothersome/symptomatic for patient. Currently flared.  Atopic dermatitis (eczema) is a chronic, relapsing, pruritic condition that can significantly affect quality of life. It is often associated with allergic rhinitis and/or asthma and can require treatment with topical medications, phototherapy, or in severe cases biologic injectable medication (Dupixent; Adbry) or Oral JAK inhibitors.     Treatment Plan: Apply clobetasol 0.05% cream twice a day as needed to affected areas, back of neck and lower back for up to 2 weeks. Avoid applying to face, groin, and  axilla. Use as directed. Long-term use can cause thinning of the skin.  Apply Clobetasol solution BID 1-2 drops to aa, scalp.   Apply HC 2.5% ointment to aa, gluteal crease. Use as directed. Long-term use can cause thinning of the skin.   Topical steroids (such as triamcinolone, fluocinolone, fluocinonide, mometasone, clobetasol, halobetasol, betamethasone, hydrocortisone) can cause thinning and lightening of the skin if they are used for too long in the same area. Your physician has selected the right strength medicine for your problem and area affected on the body. Please use your medication only as directed by your physician to prevent side effects.     Return for as scheduled check moles face, f/up rash.  I, Jacquelynn Vera, CMA, am acting as scribe for Artemio Larry, MD .   Documentation: I have reviewed the above documentation for accuracy and completeness, and I agree with the above.  Artemio Larry, MD

## 2024-02-28 NOTE — Patient Instructions (Addendum)
 Apply clobetasol 0.05% cream twice a day as needed to affected areas, back of neck and lower back for up to 2 weeks. Avoid applying to face, groin, and axilla. Use as directed. Long-term use can cause thinning of the skin.  For scalp: Apply Clobetasol solution twice a day 1-2 drops to affected areas, scalp.   For gluteal crease: Apply HC 2.5% ointment to affected areas, gluteal crease. Avoid applying to face, groin, and axilla. Use as directed. Long-term use can cause thinning of the skin.      Gentle Skin Care Guide  1. Bathe no more than once a day.  2. Avoid bathing in hot water  3. Use a mild soap like Dove, Vanicream, Cetaphil, CeraVe. Can use Lever 2000 or Cetaphil antibacterial soap  4. Use soap only where you need it. On most days, use it under your arms, between your legs, and on your feet. Let the water rinse other areas unless visibly dirty.  5. When you get out of the bath/shower, use a towel to gently blot your skin dry, don't rub it.  6. While your skin is still a little damp, apply a moisturizing cream such as Vanicream, CeraVe, Cetaphil, Eucerin, Sarna lotion or plain Vaseline Jelly. For hands apply Neutrogena [place] Hand Cream or Excipial Hand Cream.  7. Reapply moisturizer any time you start to itch or feel dry.  8. Sometimes using free and clear laundry detergents can be helpful. Fabric softener sheets should be avoided. Downy Free & Gentle liquid, or any liquid fabric softener that is free of dyes and perfumes, it acceptable to use  9. If your doctor has given you prescription creams you may apply moisturizers over them     Topical steroids (such as triamcinolone, fluocinolone, fluocinonide, mometasone, clobetasol, halobetasol, betamethasone, hydrocortisone) can cause thinning and lightening of the skin if they are used for too long in the same area. Your physician has selected the right strength medicine for your problem and area affected on the body. Please  use your medication only as directed by your physician to prevent side effects.      Due to recent changes in healthcare laws, you may see results of your pathology and/or laboratory studies on MyChart before the doctors have had a chance to review them. We understand that in some cases there may be results that are confusing or concerning to you. Please understand that not all results are received at the same time and often the doctors may need to interpret multiple results in order to provide you with the best plan of care or course of treatment. Therefore, we ask that you please give us  2 business days to thoroughly review all your results before contacting the office for clarification. Should we see a critical lab result, you will be contacted sooner.   If You Need Anything After Your Visit  If you have any questions or concerns for your doctor, please call our main line at [phone] and press option 4 to reach your doctor's medical assistant. If no one answers, please leave a voicemail as directed and we will return your call as soon as possible. Messages left after 4 pm will be answered the following business day.   You may also send us  a message via MyChart. We typically respond to MyChart messages within 1-2 business days.  For prescription refills, please ask your pharmacy to contact our office. Our fax number is [phone].  If you have an urgent issue when the clinic is closed  that cannot wait until the next business day, you can page your doctor at the number below.    Please note that while we do our best to be available for urgent issues outside of office hours, we are not available 24/7.   If you have an urgent issue and are unable to reach us , you may choose to seek medical care at your doctor's office, retail clinic, urgent care center, or emergency room.  If you have a medical emergency, please immediately call 911 or go to the emergency department.  Pager Numbers  -  Dr. Bary Likes: [phone]  - Dr. Annette Barters: [phone]  - Dr. Felipe Horton: [phone]   In the event of inclement weather, please call our main line at [phone] for an update on the status of any delays or closures.  Dermatology Medication Tips: Please keep the boxes that topical medications come in in order to help keep track of the instructions about where and how to use these. Pharmacies typically print the medication instructions only on the boxes and not directly on the medication tubes.   If your medication is too expensive, please contact our office at [phone] option 4 or send us  a message through MyChart.   We are unable to tell what your co-pay for medications will be in advance as this is different depending on your insurance coverage. However, we may be able to find a substitute medication at lower cost or fill out paperwork to get insurance to cover a needed medication.   If a prior authorization is required to get your medication covered by your insurance company, please allow us  1-2 business days to complete this process.  Drug prices often vary depending on where the prescription is filled and some pharmacies may offer cheaper prices.  The website www.goodrx.com contains coupons for medications through different pharmacies. The prices here do not account for what the cost may be with help from insurance (it may be cheaper with your insurance), but the website can give you the price if you did not use any insurance.  - You can print the associated coupon and take it with your prescription to the pharmacy.  - You may also stop by our office during regular business hours and pick up a GoodRx coupon card.  - If you need your prescription sent electronically to a different pharmacy, notify our office through [facility] or by phone at [phone] option 4.     Si Usted Necesita Algo Despus de Su Visita  Tambin puede enviarnos un mensaje a travs de Clinical cytogeneticist. Por  lo general respondemos a los mensajes de MyChart en el transcurso de 1 a 2 das hbiles.  Para renovar recetas, por favor pida a su farmacia que se ponga en contacto con nuestra oficina. Franz Jacks de fax es [place] [phone].  Si tiene un asunto urgente cuando la clnica est cerrada y que no puede esperar hasta el siguiente da hbil, puede llamar/localizar a su doctor(a) al nmero que aparece a continuacin.   Por favor, tenga en cuenta que aunque hacemos todo lo posible para estar disponibles para asuntos urgentes fuera del horario de [place], no estamos disponibles las 24 horas del da, los 7 [address]  [address] de la [place].   Si tiene un problema urgente y no puede comunicarse con nosotros, puede optar por buscar atencin mdica  en el consultorio de su doctor(a), en una clnica privada, en un centro de atencin urgente o en una sala de emergencias.  Si tiene [employer],  por favor llame inmediatamente al 911 o vaya a la sala de emergencias.  Nmeros de bper  - Dr. Bary Likes: [phone]  - Dra. Annette Barters: [phone]  - Dr. Felipe Horton: [phone]   En caso de inclemencias del tiempo, por favor llame a Lajuan Pila principal al [phone] para una actualizacin sobre el [facility] de cualquier retraso o cierre.  Consejos para la medicacin en dermatologa: Por favor, guarde las cajas en las que vienen los medicamentos de uso tpico para ayudarle a seguir las instrucciones sobre dnde y cmo usarlos. Las farmacias generalmente imprimen las instrucciones del medicamento slo en las cajas y no directamente en los tubos del [place].   Si su medicamento es muy caro, por favor, pngase en contacto con Bettyjane Brunet llamando al [phone] y presione la opcin 4 o envenos un mensaje a travs de Clinical cytogeneticist.   No podemos decirle cul ser su copago por los medicamentos por adelantado ya que esto es diferente dependiendo de la cobertura de su seguro. Sin embargo, es posible que podamos  encontrar un medicamento sustituto a Audiological scientist un formulario para que el seguro cubra el medicamento que se considera necesario.   Si se requiere una autorizacin previa para que su compaa de seguros [place] su medicamento, por favor permtanos de 1 a 2 das hbiles para completar este proceso.  Los precios de los medicamentos varan con frecuencia dependiendo del Environmental consultant de dnde se surte la receta y alguna farmacias pueden ofrecer precios ms baratos.  El sitio web www.goodrx.com tiene cupones para medicamentos de Health and safety inspector. Los precios aqu no tienen en cuenta lo que podra costar con la ayuda del seguro (puede ser ms barato con su seguro), pero el sitio web puede darle el precio si no utiliz Tourist information centre manager.  - Puede imprimir el cupn correspondiente y llevarlo con su receta a la farmacia.  - Tambin puede pasar por nuestra oficina durante el horario de atencin regular y Education officer, museum una tarjeta de cupones de GoodRx.  - Si necesita que su receta se enve electrnicamente a una farmacia diferente, informe a nuestra oficina a travs de MyChart de [place] o por telfono llamando al [phone] y presione la opcin 4.

## 2024-04-09 ENCOUNTER — Ambulatory Visit: Admitting: Dermatology

## 2024-04-09 ENCOUNTER — Encounter: Payer: Self-pay | Admitting: Dermatology

## 2024-04-09 DIAGNOSIS — L820 Inflamed seborrheic keratosis: Secondary | ICD-10-CM

## 2024-04-09 DIAGNOSIS — L821 Other seborrheic keratosis: Secondary | ICD-10-CM

## 2024-04-09 DIAGNOSIS — L309 Dermatitis, unspecified: Secondary | ICD-10-CM

## 2024-04-09 MED ORDER — CLOBETASOL PROPIONATE 0.05 % EX SOLN: 1.0000 | Freq: Two times a day (BID) | CUTANEOUS | 2 refills | Status: AC

## 2024-04-09 NOTE — Patient Instructions (Addendum)
 Continue clobetasol  0.05% cream twice a day as needed to affected areas, back of neck and lower back for up to 2 weeks. Avoid applying to face, groin, and axilla. Use as directed. Long-term use can cause thinning of the skin.   Continue Clobetasol  solution BID 1-2 drops to affected areas on scalp as needed.    Continue HC 2.5% ointment to affected areas at gluteal crease. Use as directed as needed. Long-term use can cause thinning of the skin.   Topical steroids (such as triamcinolone, fluocinolone, fluocinonide, mometasone, clobetasol , halobetasol, betamethasone, hydrocortisone ) can cause thinning and lightening of the skin if they are used for too long in the same area. Your physician has selected the right strength medicine for your problem and area affected on the body. Please use your medication only as directed by your physician to prevent side effects.    Seborrheic Keratosis  What causes seborrheic keratoses? Seborrheic keratoses are harmless, common skin growths that first appear during adult life.  As time goes by, more growths appear.  Some people may develop a large number of them.  Seborrheic keratoses appear on both covered and uncovered body parts.  They are not caused by sunlight.  The tendency to develop seborrheic keratoses can be inherited.  They vary in color from skin-colored to gray, brown, or even black.  They can be either smooth or have a rough, warty surface.   Seborrheic keratoses are superficial and look as if they were stuck on the skin.  Under the microscope this type of keratosis looks like layers upon layers of skin.  That is why at times the top layer may seem to fall off, but the rest of the growth remains and re-grows.    Treatment Seborrheic keratoses do not need to be treated, but can easily be removed in the office.  Seborrheic keratoses often cause symptoms when they rub on clothing or jewelry.  Lesions can be in the way of shaving.  If they become inflamed,  they can cause itching, soreness, or burning.  Removal of a seborrheic keratosis can be accomplished by freezing, burning, or surgery. If any spot bleeds, scabs, or grows rapidly, please return to have it checked, as these can be an indication of a skin cancer.    Recommend daily broad spectrum sunscreen SPF 30+ to sun-exposed areas, reapply every 2 hours as needed. Call for new or changing lesions.  Staying in the shade or wearing long sleeves, sun glasses (UVA+UVB protection) and wide brim hats (4-inch brim around the entire circumference of the hat) are also recommended for sun protection.      Due to recent changes in healthcare laws, you may see results of your pathology and/or laboratory studies on MyChart before the doctors have had a chance to review them. We understand that in some cases there may be results that are confusing or concerning to you. Please understand that not all results are received at the same time and often the doctors may need to interpret multiple results in order to provide you with the best plan of care or course of treatment. Therefore, we ask that you please give us  2 business days to thoroughly review all your results before contacting the office for clarification. Should we see a critical lab result, you will be contacted sooner.   If You Need Anything After Your Visit  If you have any questions or concerns for your doctor, please call our main line at [phone] and press option 4 to reach  your doctor's medical assistant. If no one answers, please leave a voicemail as directed and we will return your call as soon as possible. Messages left after 4 pm will be answered the following business day.   You may also send us  a message via MyChart. We typically respond to MyChart messages within 1-2 business days.  For prescription refills, please ask your pharmacy to contact our office. Our fax number is [phone].  If you have an urgent issue when the clinic  is closed that cannot wait until the next business day, you can page your doctor at the number below.    Please note that while we do our best to be available for urgent issues outside of office hours, we are not available 24/7.   If you have an urgent issue and are unable to reach us , you may choose to seek medical care at your doctor's office, retail clinic, urgent care center, or emergency room.  If you have a medical emergency, please immediately call 911 or go to the emergency department.  Pager Numbers  - Dr. Bary Likes: [phone]  - Dr. Annette Barters: [phone]  - Dr. Felipe Horton: [phone]   In the event of inclement weather, please call our main line at [phone] for an update on the status of any delays or closures.  Dermatology Medication Tips: Please keep the boxes that topical medications come in in order to help keep track of the instructions about where and how to use these. Pharmacies typically print the medication instructions only on the boxes and not directly on the medication tubes.   If your medication is too expensive, please contact our office at [phone] option 4 or send us  a message through MyChart.   We are unable to tell what your co-pay for medications will be in advance as this is different depending on your insurance coverage. However, we may be able to find a substitute medication at lower cost or fill out paperwork to get insurance to cover a needed medication.   If a prior authorization is required to get your medication covered by your insurance company, please allow us  1-2 business days to complete this process.  Drug prices often vary depending on where the prescription is filled and some pharmacies may offer cheaper prices.  The website www.goodrx.com contains coupons for medications through different pharmacies. The prices here do not account for what the cost may be with help from insurance (it may be cheaper with your insurance), but the website  can give you the price if you did not use any insurance.  - You can print the associated coupon and take it with your prescription to the pharmacy.  - You may also stop by our office during regular business hours and pick up a GoodRx coupon card.  - If you need your prescription sent electronically to a different pharmacy, notify our office through [facility] or by phone at [phone] option 4.     Si Usted Necesita Algo Despus de Su Visita  Tambin puede enviarnos un mensaje a travs de Clinical cytogeneticist. Por lo general respondemos a los mensajes de MyChart en el transcurso de 1 a 2 das hbiles.  Para renovar recetas, por favor pida a su farmacia que se ponga en contacto con nuestra oficina. Franz Jacks de fax es [place] [phone].  Si tiene un asunto urgente cuando la clnica est cerrada y que no puede esperar hasta el siguiente da hbil, puede llamar/localizar a su doctor(a) al nmero que aparece a continuacin.  Por favor, tenga en cuenta que aunque hacemos todo lo posible para estar disponibles para asuntos urgentes fuera del horario de [place], no estamos disponibles las 24 horas del da, los 7 [address]  [address] de la [place].   Si tiene un problema urgente y no puede comunicarse con nosotros, puede optar por buscar atencin mdica  en el consultorio de su doctor(a), en una clnica privada, en un centro de atencin urgente o en una sala de emergencias.  Si tiene Engineer, drilling, por favor llame inmediatamente al 911 o vaya a la sala de emergencias.  Nmeros de bper  - Dr. Bary Likes: [phone]  - Dra. Annette Barters: [phone]  - Dr. Felipe Horton: [phone]   En caso de inclemencias del tiempo, por favor llame a Lajuan Pila principal al [phone] para una actualizacin sobre el [place] de cualquier retraso o cierre.  Consejos para la medicacin en dermatologa: Por favor, guarde las cajas en las que vienen los medicamentos de uso tpico para ayudarle a seguir las instrucciones  sobre dnde y cmo usarlos. Las farmacias generalmente imprimen las instrucciones del medicamento slo en las cajas y no directamente en los tubos del [place].   Si su medicamento es muy caro, por favor, pngase en contacto con Bettyjane Brunet llamando al [phone] y presione la opcin 4 o envenos un mensaje a travs de Clinical cytogeneticist.   No podemos decirle cul ser su copago por los medicamentos por adelantado ya que esto es diferente dependiendo de la cobertura de su seguro. Sin embargo, es posible que podamos encontrar un medicamento sustituto a Audiological scientist un formulario para que el seguro cubra el medicamento que se considera necesario.   Si se requiere una autorizacin previa para que su compaa de seguros [place] su medicamento, por favor permtanos de 1 a 2 das hbiles para completar este proceso.  Los precios de los medicamentos varan con frecuencia dependiendo del Environmental consultant de dnde se surte la receta y alguna farmacias pueden ofrecer precios ms baratos.  El sitio web www.goodrx.com tiene cupones para medicamentos de Health and safety inspector. Los precios aqu no tienen en cuenta lo que podra costar con la ayuda del seguro (puede ser ms barato con su seguro), pero el sitio web puede darle el precio si no utiliz Tourist information centre manager.  - Puede imprimir el cupn correspondiente y llevarlo con su receta a la farmacia.  - Tambin puede pasar por nuestra oficina durante el horario de atencin regular y Education officer, museum una tarjeta de cupones de GoodRx.  - Si necesita que su receta se enve electrnicamente a una farmacia diferente, informe a nuestra oficina a travs de MyChart de [place] o por telfono llamando al [phone] y presione la opcin 4.

## 2024-04-09 NOTE — Progress Notes (Signed)
   Follow-Up Visit   Subjective  Roberta Lane is a 68 y.o. female who presents for the following: moles on face and chest. Pt. states she doesn't want the moles treated today just looked at. Has had them Tx with LN2 in the past. Has healed well. States areas are dry and rough and get irritated.    Pt.states eczema is doing better with medications prescribed.   Pt. is using clobetasol  0.05% cream twice a day as needed to affected areas, back of neck and lower back,Clobetasol  solution BID 1-2 drops to aa, scalp. HC 2.5% ointment to aa, gluteal crease.  The patient has spots, moles and lesions to be evaluated, some may be new or changing and the patient may have concern these could be cancer.   The following portions of the chart were reviewed this encounter and updated as appropriate: medications, allergies, medical history  Review of Systems:  No other skin or systemic complaints except as noted in HPI or Assessment and Plan.  Objective  Well appearing patient in no apparent distress; mood and affect are within normal limits.  A focused examination was performed of the following areas: Face and chest  Relevant exam findings are noted in the Assessment and Plan.  BL cheeks Waxy tan brown scaly papules with erythema  Assessment & Plan   SEBORRHEIC KERATOSIS - Stuck-on, waxy, tan-brown papules and/or plaques at left breast, right thigh.  - Benign-appearing - Discussed benign etiology and prognosis. - Observe - Call for any changes  Dermatitis- atopic vs contact with ID rxn Exam: post neck/scalp clear today    Chronic condition with duration or expected duration over one year. Currently well-controlled.    Atopic dermatitis (eczema) is a chronic, relapsing, pruritic condition that can significantly affect quality of life. It is often associated with allergic rhinitis and/or asthma and can require treatment with topical medications, phototherapy, or in severe cases biologic  injectable medication (Dupixent; Adbry) or Oral JAK inhibitors.      Treatment Plan: Continue clobetasol  0.05% cream twice a day as needed to affected areas, back of neck and lower back prn flares for up to 2 weeks. Avoid applying to face, groin, and axilla. Use as directed. Long-term use can cause thinning of the skin.   Continue Clobetasol  solution BID 1-2 drops to aa, scalp as needed itching.    Continue HC 2.5% ointment to aa, gluteal crease. Use as directed as needed. Long-term use can cause thinning of the skin.   Topical steroids (such as triamcinolone, fluocinolone, fluocinonide, mometasone, clobetasol , halobetasol, betamethasone, hydrocortisone ) can cause thinning and lightening of the skin if they are used for too long in the same area. Your physician has selected the right strength medicine for your problem and area affected on the body. Please use your medication only as directed by your physician to prevent side effects.   INFLAMED SEBORRHEIC KERATOSIS BL cheeks Symptomatic, irritating, patient would like treated.   Recommend cryotherapy on f/up- pt has upcoming event and wants to postpone treatment today  Return in about 1 month (around 05/09/2024) for ISK removal .  I, Jill Parcell, CMA, am acting as scribe for Artemio Larry, MD.   Documentation: I have reviewed the above documentation for accuracy and completeness, and I agree with the above.  Artemio Larry, MD

## 2024-04-18 ENCOUNTER — Other Ambulatory Visit: Payer: Self-pay | Admitting: Orthopedic Surgery

## 2024-04-18 DIAGNOSIS — S46012A Strain of muscle(s) and tendon(s) of the rotator cuff of left shoulder, initial encounter: Secondary | ICD-10-CM

## 2024-04-21 ENCOUNTER — Ambulatory Visit
Admission: RE | Admit: 2024-04-21 | Discharge: 2024-04-21 | Disposition: A | Discharge status: Home / Self Care | Source: Ambulatory Visit | Attending: Orthopedic Surgery | Admitting: Orthopedic Surgery

## 2024-04-21 DIAGNOSIS — S46012A Strain of muscle(s) and tendon(s) of the rotator cuff of left shoulder, initial encounter: Secondary | ICD-10-CM | POA: Insufficient documentation

## 2024-05-16 ENCOUNTER — Ambulatory Visit: Attending: Orthopedic Surgery

## 2024-05-16 DIAGNOSIS — G8929 Other chronic pain: Secondary | ICD-10-CM | POA: Diagnosis present

## 2024-05-16 DIAGNOSIS — M25512 Pain in left shoulder: Secondary | ICD-10-CM | POA: Insufficient documentation

## 2024-05-16 DIAGNOSIS — M6281 Muscle weakness (generalized): Secondary | ICD-10-CM | POA: Insufficient documentation

## 2024-05-16 DIAGNOSIS — M25612 Stiffness of left shoulder, not elsewhere classified: Secondary | ICD-10-CM | POA: Insufficient documentation

## 2024-05-16 NOTE — Therapy (Signed)
 OUTPATIENT PHYSICAL THERAPY SHOULDER EVALUATION   Patient Name: Roberta Lane MRN: [id number] DOB:09-28-1956, 68 y.o., female Today's Date: 05/16/2024  END OF SESSION:  PT End of Session - 05/16/24 1312     Visit Number 1    Number of Visits 16    Date for PT Re-Evaluation 07/11/24    PT Start Time 1315    PT Stop Time 1358    PT Time Calculation (min) 43 min    Activity Tolerance Patient tolerated treatment well;Patient limited by pain    Behavior During Therapy WFL for tasks assessed/performed          Past Medical History:  Diagnosis Date   Abdominal hernia    Allergic genetic state    Aortic ejection murmur    Ascites    Chronic hepatitis C (HCC)    Depression    Diabetes mellitus without complication (HCC)    Dysrhythmia    GERD (gastroesophageal reflux disease)    GIB (gastrointestinal bleeding)    Headache    Hepatic cirrhosis (HCC)    Hepatitis C    Hernia, abdominal    Hyperlipidemia    Hypertension    IDA (iron  deficiency anemia)    Inflamed seborrheic keratosis    Interdigital neuroma of foot    Irregular heart rhythm    Liver disease    Multiple renal cysts    Palpitations    Pancytopenia (HCC)    Portal hypertension with esophageal varices (HCC)    Splenomegaly    UTI (urinary tract infection)    Past Surgical History:  Procedure Laterality Date   ABDOMINAL HYSTERECTOMY     CHOLECYSTECTOMY     COLONOSCOPY WITH PROPOFOL  N/A 12/05/2023   Procedure: COLONOSCOPY WITH PROPOFOL ;  Surgeon: Toledo, Ladell POUR, MD;  Location: [facility];  Service: Gastroenterology;  Laterality: N/A;  DM   ESOPHAGOGASTRODUODENOSCOPY N/A 02/17/2021   Procedure: ESOPHAGOGASTRODUODENOSCOPY (EGD);  Surgeon: Toledo, Ladell POUR, MD;  Location: [facility];  Service: Gastroenterology;  Laterality: N/A;   ESOPHAGOGASTRODUODENOSCOPY (EGD) WITH PROPOFOL  N/A 11/23/2017   Procedure: ESOPHAGOGASTRODUODENOSCOPY (EGD) WITH PROPOFOL ;  Surgeon: Toledo, Ladell POUR, MD;  Location:  [facility];  Service: Gastroenterology;  Laterality: N/A;   ESOPHAGOGASTRODUODENOSCOPY (EGD) WITH PROPOFOL  N/A 10/23/2018   Procedure: ESOPHAGOGASTRODUODENOSCOPY (EGD) WITH PROPOFOL ;  Surgeon: Toledo, Ladell POUR, MD;  Location: [facility];  Service: Gastroenterology;  Laterality: N/A;   ESOPHAGOGASTRODUODENOSCOPY (EGD) WITH PROPOFOL  N/A 10/10/2019   Procedure: ESOPHAGOGASTRODUODENOSCOPY (EGD) WITH PROPOFOL ;  Surgeon: Toledo, Ladell POUR, MD;  Location: [facility];  Service: Gastroenterology;  Laterality: N/A;   ESOPHAGOGASTRODUODENOSCOPY (EGD) WITH PROPOFOL  N/A 11/14/2019   Procedure: ESOPHAGOGASTRODUODENOSCOPY (EGD) WITH PROPOFOL ;  Surgeon: Toledo, Ladell POUR, MD;  Location: [facility];  Service: Gastroenterology;  Laterality: N/A;   ESOPHAGOGASTRODUODENOSCOPY (EGD) WITH PROPOFOL  N/A 12/05/2023   Procedure: ESOPHAGOGASTRODUODENOSCOPY (EGD) WITH PROPOFOL ;  Surgeon: Toledo, Ladell POUR, MD;  Location: [facility];  Service: Gastroenterology;  Laterality: N/A;   right elbow surgery     WISDOM TOOTH EXTRACTION     Patient Active Problem List   Diagnosis Date Noted   Renal mass, left 04/11/2020   Hypertension 03/18/2020   Hepatic cirrhosis due to chronic hepatitis C infection (HCC) 11/28/2019   Chronic pain of both knees 03/26/2019   Other ascites 03/26/2019   Aortic stenosis, mild 01/07/2019   Iron  deficiency anemia 12/31/2018   Hepatic encephalopathy (HCC) 07/20/2018   Frequent PVCs 06/13/2018   Portal hypertension with esophageal varices (HCC) 06/12/2018   Aortic ejection murmur 05/29/2018   Dizziness 05/29/2018  Hyperlipidemia, mixed 05/29/2018   Palpitations 05/29/2018   Controlled type 2 diabetes mellitus without complication, without long-term current use of insulin  (HCC) 05/17/2018   Abdominal pain, diffuse 12/14/2017   Gastroesophageal reflux disease without esophagitis 12/14/2017   Pancytopenia (HCC) 12/10/2017   GIB (gastrointestinal bleeding) 11/21/2017    PCP:  Lynwood Null, MD  REFERRING PROVIDER: Ozell Flake, MD  REFERRING DIAG: S46.012D (ICD-10-CM) - Strain of muscle(s) and tendon(s) of the rotator cuff of left shoulder, subsequent encounter   THERAPY DIAG:  Chronic left shoulder pain  Muscle weakness (generalized)  Decreased range of motion of shoulder, left  Rationale for Evaluation and Treatment: Rehabilitation  ONSET DATE: October 2024  SUBJECTIVE:                                                                                                                                                                                      SUBJECTIVE STATEMENT: Pt reports that her shoulder is feeling achy today. Pt reports that the worst motions are when she's reaching over her head, behind her, or forward. Pt states that the orthopedic surgeon wants her to do 4 weeks of PT and if the pain does not improve then they will do surgery.  Hand dominance: Right  PERTINENT HISTORY: Pt was seen by PCP at [facility] on 04/11/24 with c/c of L shoulder pain that has progressively worsened after a fall pt sustained in October of 2024. Pt states that she lifted her 35 pound suitcase onto the shuttle bus at the airport in [place] and she caught her foot on the step of the shuttle bus and pulled her suitcase and it fell backward onto her shoulder. PCP referred pt to orthopedic surgery, who saw pt on 04/17/24. Per orthopedic surgery note from 6/25: A suspected rotator cuff tear in the left shoulder followed a fall in October. Persistent pain radiating to the forearm and thumb suggests possible nerve involvement. X-ray shows mild AC degenerative changes with slight separation, but no calcific tendonitis or significant glenohumeral degenerative change. Physical examination reveals painful and weak shoulder movements, indicating a rotator cuff tear. MRI was ordered by orthopedic surgeon, with findings suggestive of mild to moderate insertional supraspinatus  tendinopathy & probable small interstitial tear to anterior insertion without a full-thickness tear.  PMH significant for abdominal hernia, aortic ejection murmur, chronic hepatitis C, depression, DM, GERD, headache, hepatic cirrhosis, HTN, anemia, irregular heart rhythm, palpitations  Pt states that the orthopedic surgeon wants her to do 4 weeks of PT and if the pain does not improve then they will do surgery.   PAIN:  Are you having pain? Yes: NPRS scale: 7.5/10 currently Pain location:  Anterior shoulder, into lateral deltoid Pain description: aching Aggravating factors: Reaching overhead, behind her, forward Relieving factors: Biofreeze helped initially  PRECAUTIONS: None  RED FLAGS: None   WEIGHT BEARING RESTRICTIONS: No  FALLS:  Has patient fallen in last 6 months? 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  DIAGNOSTIC FINDINGS:  MRI Shoulder Left WO Contrast (from 04/21/24):  FINDINGS: There is mild to moderate insertional tendinopathy of the supraspinatus tendon and a small interstitial tear to the anterior insertion. No retraction or full-thickness tear. Mild impingement change deep to the infraspinatus insertion. The subscapularis and teres minor tendons are unremarkable. There is mild-to-moderate tendinopathy of the intra-articular biceps tendon greater  distally. Fibers are intact.   There is no labral tear. Mild glenohumeral and acromioclavicular osteoarthritis with joint space narrowing and mild degenerative edema.   IMPRESSION: Mild to moderate insertional tendinopathy of the supraspinatus tendon and a probable small interstitial partial tear to the anterior insertion without retraction or full-thickness tear.   Mild-to-moderate tendinopathy of the intra-articular biceps tendon.   Mild glenohumeral and acromioclavicular osteoarthritis.  PATIENT SURVEYS:  QuickDASH: 88.6 / 100 = 88.6 %  COGNITION: Overall cognitive status: Within functional limits for tasks assessed     SENSATION: WFL Light touch: Pt reports that it feels duller on her L side around her shoulder  POSTURE: Slight forward head, rounded shoulders- pt holds L UE in guarded position against body at rest  CERVICAL ROM: all motions appeared WFL, pt has pain at end range extension and end range rotation to L  UPPER EXTREMITY ROM:    Active ROM Right eval Left eval  Shoulder flexion 150 65* active, 120 passive  Shoulder extension    Shoulder abduction 165 60* active, 100 passive  Shoulder adduction    Shoulder internal rotation 60 53  Shoulder external rotation 75 50  Elbow flexion    Elbow extension    Wrist flexion    Wrist extension    Wrist ulnar deviation    Wrist radial deviation    Wrist pronation    Wrist supination    (Blank rows = not tested)  UPPER EXTREMITY MMT: Unable to assess at eval due to significant pain/ROM limitations  MMT Right eval Left eval  Shoulder flexion    Shoulder extension    Shoulder abduction    Shoulder adduction    Shoulder internal rotation    Shoulder external rotation    Middle trapezius    Lower trapezius    Elbow flexion    Elbow extension    Wrist flexion    Wrist extension    Wrist ulnar deviation    Wrist radial deviation    Wrist pronation    Wrist supination    Grip strength (lbs)     (Blank rows = not tested)  SHOULDER SPECIAL TESTS: Rotator cuff assessment: Full can test: positive  PALPATION:  Tender on palpation of L anterior shoulder, L AC joint, Supraspinatus tendon                                                                                                                             TREATMENT DATE: 05/16/24 Evaluation + HEP review Self-Care and Home Management: Pt provided with full verbal education as well as demonstration of HEP below. Pt returned all demonstrations with 10 reps of each exercise on HEP: Access Code: [email or web address]  URL: [email or web address]  Date: 05/16/2024  Prepared by: Marina  Moser  Exercises - Standing Shoulder Abduction Slides at Wall  - 1 x daily - 7 x weekly - 2 sets - 10 reps - 5 hold  - Standing shoulder flexion wall slides  - 1 x daily - 7 x weekly - 2 sets - 10 reps - 5 hold  - Seated Shoulder Pendulum Exercise  - 1 x daily - 7 x weekly - 2 sets - 10 reps - 5 hold    PATIENT EDUCATION: Education details: Pt educated on importance of maintaining movement in L shoulder for healing purposes and to prevent frozen shoulder. Pt educated on HEP and provided with full demonstration as well as cues to prevent pushing AROM passed point of severe pain Person educated: Patient Education method: Explanation, Demonstration, Verbal cues, and Handouts Education comprehension: verbalized understanding and returned demonstration  HOME EXERCISE PROGRAM: Access Code: [email or web address]  URL: [email or web address]  Date: 05/16/2024  Prepared by: Namon Custard  Exercises - Standing Shoulder Abduction Slides at Wall  - 1 x daily - 7 x weekly - 2 sets - 10 reps - 5 hold  - Standing shoulder flexion wall slides  - 1 x daily - 7 x weekly - 2 sets - 10 reps - 5 hold  - Seated Shoulder Pendulum Exercise  - 1 x daily - 7 x weekly - 2 sets - 10 reps - 5 hold   ASSESSMENT:  CLINICAL IMPRESSION: Patient is a 68 y.o. female who was  seen today for physical therapy evaluation and treatment for L shoulder pain. Pt presented with significant pain in her L shoulder, with pt visually guarded at rest. Pt demonstrated limitations in L shoulder AROM, particularly with flexion and abduction. Strength testing deferred at eval due to AROM limitations preventing pt from achieving gold standard testing positions. Pt's limitations have significantly impacted pt's ability to utilize her L UE for functional tasks such as driving, dressing, bathing, and reaching. Pt would benefit from skilled PT intervention to decrease her pain, improve her UE AROM & strength, and improve pt's ability to perform functional tasks and ADLs.   OBJECTIVE IMPAIRMENTS: decreased activity tolerance, decreased mobility, decreased ROM, decreased strength, hypomobility, impaired flexibility, impaired sensation, impaired UE functional use, improper body mechanics, postural dysfunction, and pain.   ACTIVITY LIMITATIONS: carrying, lifting, sleeping, transfers, bed mobility, bathing, toileting, dressing, self feeding, reach over head, hygiene/grooming, locomotion level, and caring for others  PARTICIPATION LIMITATIONS:  meal prep, cleaning, laundry, driving, shopping, community activity, and yard work  PERSONAL FACTORS: Age, Profession, Time since onset of injury/illness/exacerbation, and 3+ comorbidities: abdominal hernia, aortic ejection murmur, chronic hepatitis C, depression, DM, GERD, headache, hepatic cirrhosis, HTN, anemia, irregular heart rhythm, palpitations are also affecting patient's functional outcome.   REHAB POTENTIAL: Good  CLINICAL DECISION MAKING: Evolving/moderate complexity  EVALUATION COMPLEXITY: Moderate   GOALS: Goals reviewed with patient? Yes  SHORT TERM GOALS: Target date: 06/06/2024   Patient will be independent in home exercise program to improve strength/mobility for better functional independence with ADLs. Baseline: see HEP above Goal  status: INITIAL   LONG TERM GOALS: Target date: 07/11/2024  Patient will decrease Quick DASH score by > 8 points demonstrating reduced self-reported upper extremity disability. Baseline: 88.6 / 100 = 88.6 % Goal status: INITIAL  2.  Patient will improve L shoulder AROM to > 140 degrees of flexion and abduction for improved ability to perform overhead activities. Baseline: flexion- 65 active, 120 passive; abduction- 60 active, 100 passive Goal status: INITIAL  3.  Pt will report worst shoulder pain of 3/10 on NPS to demonstrated improvements in pain and to allow for improved UE function Baseline: 7.5/10 Goal status: INITIAL  4.  Pt will increase L UE strength to 4+/5 or better to   Baseline: assess at future session Goal status: INITIAL   PLAN:  PT FREQUENCY: 2x/week  PT DURATION: 8 weeks  PLANNED INTERVENTIONS: [id number]- PT Re-evaluation, 97750- Physical Performance Testing, 97110-Therapeutic exercises, 97530- Therapeutic activity, [id number]- Neuromuscular re-education, 97535- Self Care, [id number]- Manual therapy, [id number]- Orthotic Initial, [id number]- Orthotic/Prosthetic subsequent, [id number]- Canalith repositioning, [id number]- Splinting, [id number]- Electrical stimulation (manual), [id number]- Ultrasound, [id number]- Traction (mechanical), 20560 (1-2 muscles), 20561 (3+ muscles)- Dry Needling, Patient/Family education, Balance training, Stair training, Taping, Joint mobilization, Spinal mobilization, Vestibular training, DME instructions, Cryotherapy, and Moist heat  PLAN FOR NEXT SESSION: Assess HEP compliance, add to HEP as necessary, L shoulder AROM, L shoulder manual therapy/joint mobilizations as tolerated for pain control, isometrics  Fany Cavanaugh, SPT  This entire session was performed under direct supervision and direction of a licensed Estate agent . I have personally read, edited and approve of the note as written.  Marina  Moser, PT 05/16/2024, 3:35 PM

## 2024-05-17 NOTE — Therapy (Deleted)
 OUTPATIENT PHYSICAL THERAPY SHOULDER TREATMENT   Patient Name: Roberta Lane MRN: [id number] DOB:08/03/1956, 68 y.o., female Today's Date: 05/17/2024  END OF SESSION:    Past Medical History:  Diagnosis Date   Abdominal hernia    Allergic genetic state    Aortic ejection murmur    Ascites    Chronic hepatitis C (HCC)    Depression    Diabetes mellitus without complication (HCC)    Dysrhythmia    GERD (gastroesophageal reflux disease)    GIB (gastrointestinal bleeding)    Headache    Hepatic cirrhosis (HCC)    Hepatitis C    Hernia, abdominal    Hyperlipidemia    Hypertension    IDA (iron  deficiency anemia)    Inflamed seborrheic keratosis    Interdigital neuroma of foot    Irregular heart rhythm    Liver disease    Multiple renal cysts    Palpitations    Pancytopenia (HCC)    Portal hypertension with esophageal varices (HCC)    Splenomegaly    UTI (urinary tract infection)    Past Surgical History:  Procedure Laterality Date   ABDOMINAL HYSTERECTOMY     CHOLECYSTECTOMY     COLONOSCOPY WITH PROPOFOL  N/A 12/05/2023   Procedure: COLONOSCOPY WITH PROPOFOL ;  Surgeon: Toledo, Ladell POUR, MD;  Location: [facility];  Service: Gastroenterology;  Laterality: N/A;  DM   ESOPHAGOGASTRODUODENOSCOPY N/A 02/17/2021   Procedure: ESOPHAGOGASTRODUODENOSCOPY (EGD);  Surgeon: Toledo, Ladell POUR, MD;  Location: [facility];  Service: Gastroenterology;  Laterality: N/A;   ESOPHAGOGASTRODUODENOSCOPY (EGD) WITH PROPOFOL  N/A 11/23/2017   Procedure: ESOPHAGOGASTRODUODENOSCOPY (EGD) WITH PROPOFOL ;  Surgeon: Toledo, Ladell POUR, MD;  Location: [facility];  Service: Gastroenterology;  Laterality: N/A;   ESOPHAGOGASTRODUODENOSCOPY (EGD) WITH PROPOFOL  N/A 10/23/2018   Procedure: ESOPHAGOGASTRODUODENOSCOPY (EGD) WITH PROPOFOL ;  Surgeon: Toledo, Ladell POUR, MD;  Location: [facility];  Service: Gastroenterology;  Laterality: N/A;   ESOPHAGOGASTRODUODENOSCOPY (EGD) WITH PROPOFOL  N/A  10/10/2019   Procedure: ESOPHAGOGASTRODUODENOSCOPY (EGD) WITH PROPOFOL ;  Surgeon: Toledo, Ladell POUR, MD;  Location: [facility];  Service: Gastroenterology;  Laterality: N/A;   ESOPHAGOGASTRODUODENOSCOPY (EGD) WITH PROPOFOL  N/A 11/14/2019   Procedure: ESOPHAGOGASTRODUODENOSCOPY (EGD) WITH PROPOFOL ;  Surgeon: Toledo, Ladell POUR, MD;  Location: [facility];  Service: Gastroenterology;  Laterality: N/A;   ESOPHAGOGASTRODUODENOSCOPY (EGD) WITH PROPOFOL  N/A 12/05/2023   Procedure: ESOPHAGOGASTRODUODENOSCOPY (EGD) WITH PROPOFOL ;  Surgeon: Toledo, Ladell POUR, MD;  Location: [facility];  Service: Gastroenterology;  Laterality: N/A;   right elbow surgery     WISDOM TOOTH EXTRACTION     Patient Active Problem List   Diagnosis Date Noted   Renal mass, left 04/11/2020   Hypertension 03/18/2020   Hepatic cirrhosis due to chronic hepatitis C infection (HCC) 11/28/2019   Chronic pain of both knees 03/26/2019   Other ascites 03/26/2019   Aortic stenosis, mild 01/07/2019   Iron  deficiency anemia 12/31/2018   Hepatic encephalopathy (HCC) 07/20/2018   Frequent PVCs 06/13/2018   Portal hypertension with esophageal varices (HCC) 06/12/2018   Aortic ejection murmur 05/29/2018   Dizziness 05/29/2018   Hyperlipidemia, mixed 05/29/2018   Palpitations 05/29/2018   Controlled type 2 diabetes mellitus without complication, without long-term current use of insulin  (HCC) 05/17/2018   Abdominal pain, diffuse 12/14/2017   Gastroesophageal reflux disease without esophagitis 12/14/2017   Pancytopenia (HCC) 12/10/2017   GIB (gastrointestinal bleeding) 11/21/2017    PCP: Lynwood Null, MD  REFERRING PROVIDER: Ozell Flake, MD  REFERRING DIAG: S46.012D (ICD-10-CM) - Strain of muscle(s) and tendon(s) of the rotator cuff of left  shoulder, subsequent encounter   THERAPY DIAG:  No diagnosis found.  Rationale for Evaluation and Treatment: Rehabilitation  ONSET DATE: October 2024  SUBJECTIVE:                                                                                                                                                                                       SUBJECTIVE STATEMENT: *** Hand dominance: Right  PERTINENT HISTORY: Pt was seen by PCP at [facility] on 04/11/24 with c/c of L shoulder pain that has progressively worsened after a fall pt sustained in October of 2024. Pt states that she lifted her 35 pound suitcase onto the shuttle bus at the airport in [place] and she caught her foot on the step of the shuttle bus and pulled her suitcase and it fell backward onto her shoulder. PCP referred pt to orthopedic surgery, who saw pt on 04/17/24. Per orthopedic surgery note from 6/25: A suspected rotator cuff tear in the left shoulder followed a fall in October. Persistent pain radiating to the forearm and thumb suggests possible nerve involvement. X-ray shows mild AC degenerative changes with slight separation, but no calcific tendonitis or significant glenohumeral degenerative change. Physical examination reveals painful and weak shoulder movements, indicating a rotator cuff tear. MRI was ordered by orthopedic surgeon, with findings suggestive of mild to moderate insertional supraspinatus tendinopathy & probable small interstitial tear to anterior insertion without a full-thickness tear.  PMH significant for abdominal hernia, aortic ejection murmur, chronic hepatitis C, depression, DM, GERD, headache, hepatic cirrhosis, HTN, anemia, irregular heart rhythm, palpitations  Pt states that the orthopedic surgeon wants her to do 4 weeks of PT and if the pain does not improve then they will do surgery.   PAIN:  Are you having pain? Yes: NPRS scale: 7.5/10 currently Pain location: Anterior shoulder, into lateral deltoid Pain description: aching Aggravating factors: Reaching overhead, behind her, forward Relieving factors: Biofreeze helped initially  PRECAUTIONS: None  RED  FLAGS: None   WEIGHT BEARING RESTRICTIONS: No  FALLS:  Has patient fallen in last 6 months? 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  DIAGNOSTIC FINDINGS:  MRI Shoulder Left WO Contrast (from 04/21/24):  FINDINGS: There is mild to moderate insertional tendinopathy of the supraspinatus tendon and a small interstitial tear to the anterior insertion. No retraction or full-thickness tear. Mild impingement change deep to the infraspinatus insertion. The subscapularis and teres minor tendons are unremarkable. There is mild-to-moderate tendinopathy of the intra-articular biceps tendon greater distally. Fibers are intact.   There is no labral tear. Mild glenohumeral and acromioclavicular osteoarthritis with joint space narrowing and mild degenerative edema.   IMPRESSION: Mild to moderate insertional tendinopathy of the supraspinatus tendon and a probable small interstitial partial tear to the anterior insertion without retraction or full-thickness tear.   Mild-to-moderate tendinopathy of the intra-articular biceps tendon.   Mild glenohumeral and acromioclavicular osteoarthritis.  PATIENT SURVEYS:  QuickDASH: 88.6 / 100 = 88.6 %  COGNITION: Overall cognitive status: Within functional limits for tasks assessed     SENSATION: WFL Light touch: Pt reports that it feels  duller on her L side around her shoulder  POSTURE: Slight forward head, rounded shoulders- pt holds L UE in guarded position against body at rest  CERVICAL ROM: all motions appeared WFL, pt has pain at end range extension and end range rotation to L  UPPER EXTREMITY ROM:    Active ROM Right eval Left eval  Shoulder flexion 150 65* active, 120 passive  Shoulder extension    Shoulder abduction 165 60* active, 100 passive  Shoulder adduction    Shoulder internal rotation 60 53  Shoulder external rotation 75 50  Elbow flexion    Elbow extension    Wrist flexion    Wrist extension    Wrist ulnar deviation    Wrist radial deviation    Wrist pronation    Wrist supination    (Blank rows = not tested)  UPPER EXTREMITY MMT: Unable to assess at eval due to significant pain/ROM limitations  MMT Right eval Left eval  Shoulder flexion    Shoulder extension    Shoulder abduction    Shoulder adduction    Shoulder internal rotation    Shoulder external rotation    Middle trapezius    Lower trapezius    Elbow flexion    Elbow extension    Wrist flexion    Wrist extension    Wrist ulnar deviation    Wrist radial deviation    Wrist pronation    Wrist supination    Grip strength (lbs)    (Blank rows = not tested)  SHOULDER SPECIAL TESTS: Rotator cuff assessment: Full can test: positive    PALPATION:  Tender on palpation of L anterior shoulder, L AC joint, Supraspinatus tendon                                                                                                                             TREATMENT DATE: 05/17/24  Assess HEP compliance, add to HEP as necessary, L shoulder AROM, L shoulder manual therapy/joint mobilizations as tolerated for pain control, isometrics    PATIENT EDUCATION: Education details: Pt educated on importance of maintaining movement in L shoulder for healing purposes and to prevent frozen shoulder. Pt educated on HEP and provided with full  demonstration as well as cues to prevent pushing AROM passed point of severe pain Person educated: Patient Education method: Explanation, Demonstration, Verbal cues, and Handouts Education comprehension: verbalized understanding and returned demonstration  HOME EXERCISE PROGRAM: Access Code: [email or web address]  URL: [email or web address]  Date: 05/16/2024  Prepared by: Marina  Moser  Exercises - Standing Shoulder Abduction Slides at Wall  - 1 x daily - 7 x weekly - 2 sets - 10 reps - 5 hold  - Standing shoulder flexion wall slides  - 1 x daily - 7 x weekly - 2 sets - 10 reps - 5 hold  - Seated Shoulder Pendulum Exercise  - 1 x daily - 7 x weekly - 2 sets - 10 reps - 5 hold   ASSESSMENT:  CLINICAL IMPRESSION: Patient is a 68 y.o. female who was seen today for physical therapy evaluation and treatment for L shoulder pain. Pt presented with significant pain in her L shoulder, with pt visually guarded at rest. Pt demonstrated limitations in L shoulder AROM, particularly with flexion and abduction. Strength testing deferred at eval due to AROM limitations preventing pt from achieving gold standard testing positions. Pt's limitations have significantly impacted pt's ability to utilize her L UE for functional tasks such as driving, dressing, bathing, and reaching. Pt would benefit from skilled PT intervention to decrease her pain, improve her UE AROM & strength, and improve pt's ability to perform functional tasks and ADLs.   OBJECTIVE IMPAIRMENTS: decreased activity tolerance, decreased mobility, decreased ROM, decreased strength, hypomobility, impaired flexibility, impaired sensation, impaired UE functional use, improper body mechanics, postural dysfunction, and pain.   ACTIVITY LIMITATIONS: carrying, lifting, sleeping, transfers, bed mobility, bathing, toileting, dressing, self feeding, reach over head, hygiene/grooming, locomotion level, and caring for others  PARTICIPATION LIMITATIONS: meal  prep, cleaning, laundry, driving, shopping, community activity, and yard work  PERSONAL FACTORS: Age, Profession, Time since onset of injury/illness/exacerbation, and 3+ comorbidities: abdominal hernia, aortic ejection murmur, chronic hepatitis C, depression, DM, GERD, headache, hepatic cirrhosis, HTN, anemia, irregular heart rhythm, palpitations are also affecting patient's functional outcome.   REHAB POTENTIAL: Good  CLINICAL DECISION MAKING: Evolving/moderate complexity  EVALUATION COMPLEXITY: Moderate   GOALS: Goals reviewed with patient? Yes  SHORT TERM GOALS: Target date: 06/06/2024   Patient will be independent in home exercise program to improve strength/mobility for better functional independence with ADLs. Baseline: see HEP above Goal status: INITIAL   LONG TERM GOALS: Target date: 07/11/2024  Patient will decrease Quick DASH score by > 8 points demonstrating reduced self-reported upper extremity disability. Baseline: 88.6 / 100 = 88.6 % Goal status: INITIAL  2.  Patient will improve L shoulder AROM to > 140 degrees of flexion and abduction for improved ability to perform overhead activities. Baseline: flexion- 65 active, 120 passive; abduction- 60 active, 100 passive Goal status: INITIAL  3.  Pt will report worst shoulder pain of 3/10 on NPS to demonstrated improvements in pain and to allow for improved UE function Baseline: 7.5/10 Goal status: INITIAL  4.  Pt will increase L UE strength to 4+/5 or better to   Baseline: assess at future session Goal status: INITIAL   PLAN:  PT FREQUENCY: 2x/week  PT DURATION: 8 weeks  PLANNED INTERVENTIONS: [id number]- PT Re-evaluation, 97750- Physical Performance Testing, 97110-Therapeutic exercises, 97530- Therapeutic activity, [id number]- Neuromuscular re-education, 97535- Self Care, [id number]- Manual therapy, [id number]- Orthotic Initial, [id number]- Orthotic/Prosthetic subsequent, [phone]- Canalith repositioning, [id number]- Splinting, [id number]- Electrical  stimulation (manual), [id number]- Ultrasound, [id number]- Traction (mechanical), [phone] (1-2 muscles), [id number] (3+ muscles)- Dry Needling, Patient/Family education, Balance training, Stair training, Taping, Joint mobilization, Spinal mobilization, Vestibular training, DME instructions, Cryotherapy, and Moist heat  PLAN FOR NEXT SESSION: Assess HEP compliance, add to HEP as necessary, L shoulder AROM, L shoulder manual therapy/joint mobilizations as tolerated for pain control, isometrics  Note: Portions of this document were prepared using Dragon voice recognition software and although reviewed may contain unintentional dictation errors in syntax, grammar, or spelling.  Lonni KATHEE Gainer PT ,DPT Physical Therapist- [place]  [facility]

## 2024-05-20 ENCOUNTER — Ambulatory Visit: Admitting: Physical Therapy

## 2024-05-20 NOTE — Therapy (Incomplete)
 OUTPATIENT PHYSICAL THERAPY SHOULDER TREATMENT   Patient Name: Roberta Lane MRN: [id number] DOB:Dec 19, 1955, 68 y.o., female Today's Date: 05/20/2024  END OF SESSION:    Past Medical History:  Diagnosis Date   Abdominal hernia    Allergic genetic state    Aortic ejection murmur    Ascites    Chronic hepatitis C (HCC)    Depression    Diabetes mellitus without complication (HCC)    Dysrhythmia    GERD (gastroesophageal reflux disease)    GIB (gastrointestinal bleeding)    Headache    Hepatic cirrhosis (HCC)    Hepatitis C    Hernia, abdominal    Hyperlipidemia    Hypertension    IDA (iron  deficiency anemia)    Inflamed seborrheic keratosis    Interdigital neuroma of foot    Irregular heart rhythm    Liver disease    Multiple renal cysts    Palpitations    Pancytopenia (HCC)    Portal hypertension with esophageal varices (HCC)    Splenomegaly    UTI (urinary tract infection)    Past Surgical History:  Procedure Laterality Date   ABDOMINAL HYSTERECTOMY     CHOLECYSTECTOMY     COLONOSCOPY WITH PROPOFOL  N/A 12/05/2023   Procedure: COLONOSCOPY WITH PROPOFOL ;  Surgeon: Toledo, Ladell POUR, MD;  Location: [facility];  Service: Gastroenterology;  Laterality: N/A;  DM   ESOPHAGOGASTRODUODENOSCOPY N/A 02/17/2021   Procedure: ESOPHAGOGASTRODUODENOSCOPY (EGD);  Surgeon: Toledo, Ladell POUR, MD;  Location: [facility];  Service: Gastroenterology;  Laterality: N/A;   ESOPHAGOGASTRODUODENOSCOPY (EGD) WITH PROPOFOL  N/A 11/23/2017   Procedure: ESOPHAGOGASTRODUODENOSCOPY (EGD) WITH PROPOFOL ;  Surgeon: Toledo, Ladell POUR, MD;  Location: [facility];  Service: Gastroenterology;  Laterality: N/A;   ESOPHAGOGASTRODUODENOSCOPY (EGD) WITH PROPOFOL  N/A 10/23/2018   Procedure: ESOPHAGOGASTRODUODENOSCOPY (EGD) WITH PROPOFOL ;  Surgeon: Toledo, Ladell POUR, MD;  Location: [facility];  Service: Gastroenterology;  Laterality: N/A;   ESOPHAGOGASTRODUODENOSCOPY (EGD) WITH PROPOFOL  N/A  10/10/2019   Procedure: ESOPHAGOGASTRODUODENOSCOPY (EGD) WITH PROPOFOL ;  Surgeon: Toledo, Ladell POUR, MD;  Location: [facility];  Service: Gastroenterology;  Laterality: N/A;   ESOPHAGOGASTRODUODENOSCOPY (EGD) WITH PROPOFOL  N/A 11/14/2019   Procedure: ESOPHAGOGASTRODUODENOSCOPY (EGD) WITH PROPOFOL ;  Surgeon: Toledo, Ladell POUR, MD;  Location: [facility];  Service: Gastroenterology;  Laterality: N/A;   ESOPHAGOGASTRODUODENOSCOPY (EGD) WITH PROPOFOL  N/A 12/05/2023   Procedure: ESOPHAGOGASTRODUODENOSCOPY (EGD) WITH PROPOFOL ;  Surgeon: Toledo, Ladell POUR, MD;  Location: [facility];  Service: Gastroenterology;  Laterality: N/A;   right elbow surgery     WISDOM TOOTH EXTRACTION     Patient Active Problem List   Diagnosis Date Noted   Renal mass, left 04/11/2020   Hypertension 03/18/2020   Hepatic cirrhosis due to chronic hepatitis C infection (HCC) 11/28/2019   Chronic pain of both knees 03/26/2019   Other ascites 03/26/2019   Aortic stenosis, mild 01/07/2019   Iron  deficiency anemia 12/31/2018   Hepatic encephalopathy (HCC) 07/20/2018   Frequent PVCs 06/13/2018   Portal hypertension with esophageal varices (HCC) 06/12/2018   Aortic ejection murmur 05/29/2018   Dizziness 05/29/2018   Hyperlipidemia, mixed 05/29/2018   Palpitations 05/29/2018   Controlled type 2 diabetes mellitus without complication, without long-term current use of insulin  (HCC) 05/17/2018   Abdominal pain, diffuse 12/14/2017   Gastroesophageal reflux disease without esophagitis 12/14/2017   Pancytopenia (HCC) 12/10/2017   GIB (gastrointestinal bleeding) 11/21/2017    PCP: Lynwood Null, MD  REFERRING PROVIDER: Ozell Flake, MD  REFERRING DIAG: S46.012D (ICD-10-CM) - Strain of muscle(s) and tendon(s) of the rotator cuff of left  shoulder, subsequent encounter   THERAPY DIAG:  Muscle weakness (generalized)  Chronic left shoulder pain  Decreased range of motion of shoulder, left  Rationale for Evaluation  and Treatment: Rehabilitation  ONSET DATE: October 2024  SUBJECTIVE:                                                                                                                                                                                      SUBJECTIVE STATEMENT: ***  Pt reports that her shoulder is feeling achy today. Pt reports that the worst motions are when she's reaching over her head, behind her, or forward. Pt states that the orthopedic surgeon wants her to do 4 weeks of PT and if the pain does not improve then they will do surgery.  Hand dominance: Right  PERTINENT HISTORY: Pt was seen by PCP at [facility] on 04/11/24 with c/c of L shoulder pain that has progressively worsened after a fall pt sustained in October of 2024. Pt states that she lifted her 35 pound suitcase onto the shuttle bus at the airport in [place] and she caught her foot on the step of the shuttle bus and pulled her suitcase and it fell backward onto her shoulder. PCP referred pt to orthopedic surgery, who saw pt on 04/17/24. Per orthopedic surgery note from 6/25: A suspected rotator cuff tear in the left shoulder followed a fall in October. Persistent pain radiating to the forearm and thumb suggests possible nerve involvement. X-ray shows mild AC degenerative changes with slight separation, but no calcific tendonitis or significant glenohumeral degenerative change. Physical examination reveals painful and weak shoulder movements, indicating a rotator cuff tear. MRI was ordered by orthopedic surgeon, with findings suggestive of mild to moderate insertional supraspinatus tendinopathy & probable small interstitial tear to anterior insertion without a full-thickness tear.  PMH significant for abdominal hernia, aortic ejection murmur, chronic hepatitis C, depression, DM, GERD, headache, hepatic cirrhosis, HTN, anemia, irregular heart rhythm, palpitations  Pt states that the orthopedic surgeon wants her to do 4  weeks of PT and if the pain does not improve then they will do surgery.   PAIN:  Are you having pain? Yes: NPRS scale: 7.5/10 currently Pain location: Anterior shoulder, into lateral deltoid Pain description: aching Aggravating factors: Reaching overhead, behind her, forward Relieving factors: Biofreeze helped initially  PRECAUTIONS: None  RED FLAGS: None   WEIGHT BEARING RESTRICTIONS: No  FALLS:  Has patient fallen in last 6 months? No pt fell in October on 2024, no falls since then  LIVING ENVIRONMENT: Lives with: lives alone neighbors able to stop by if she needs them Lives in: House/apartment Stairs: Yes: Internal: 14  steps; on left going up Has following equipment at home: Shower bench  OCCUPATION: Retired- spends her days watching tv  PLOF: Independent  PATIENT GOALS: Pt wants to get back to feeling how she did before this incident, open jars, feel normal again  NEXT MD VISIT: Pt see orthopedic surgeon for follow-up in 4 weeks from PT eval - pt has to call to schedule exact date  OBJECTIVE:  Note: Objective measures were completed at Evaluation unless otherwise noted.  DIAGNOSTIC FINDINGS:  MRI Shoulder Left WO Contrast (from 04/21/24):  FINDINGS: There is mild to moderate insertional tendinopathy of the supraspinatus tendon and a small interstitial tear to the anterior insertion. No retraction or full-thickness tear. Mild impingement change deep to the infraspinatus insertion. The subscapularis and teres minor tendons are unremarkable. There is mild-to-moderate tendinopathy of the intra-articular biceps tendon greater distally. Fibers are intact.   There is no labral tear. Mild glenohumeral and acromioclavicular osteoarthritis with joint space narrowing and mild degenerative edema.   IMPRESSION: Mild to moderate insertional tendinopathy of the supraspinatus tendon and a probable small interstitial partial tear to the anterior insertion without retraction  or full-thickness tear.   Mild-to-moderate tendinopathy of the intra-articular biceps tendon.   Mild glenohumeral and acromioclavicular osteoarthritis.  PATIENT SURVEYS:  QuickDASH: 88.6 / 100 = 88.6 %  COGNITION: Overall cognitive status: Within functional limits for tasks assessed     SENSATION: WFL Light touch: Pt reports that it feels duller on her L side around her shoulder  POSTURE: Slight forward head, rounded shoulders- pt holds L UE in guarded position against body at rest  CERVICAL ROM: all motions appeared WFL, pt has pain at end range extension and end range rotation to L  UPPER EXTREMITY ROM:    Active ROM Right eval Left eval  Shoulder flexion 150 65* active, 120 passive  Shoulder extension    Shoulder abduction 165 60* active, 100 passive  Shoulder adduction    Shoulder internal rotation 60 53  Shoulder external rotation 75 50  Elbow flexion    Elbow extension    Wrist flexion    Wrist extension    Wrist ulnar deviation    Wrist radial deviation    Wrist pronation    Wrist supination    (Blank rows = not tested)  UPPER EXTREMITY MMT: Unable to assess at eval due to significant pain/ROM limitations  MMT Right eval Left eval  Shoulder flexion    Shoulder extension    Shoulder abduction    Shoulder adduction    Shoulder internal rotation    Shoulder external rotation    Middle trapezius    Lower trapezius    Elbow flexion    Elbow extension    Wrist flexion    Wrist extension    Wrist ulnar deviation    Wrist radial deviation    Wrist pronation    Wrist supination    Grip strength (lbs)    (Blank rows = not tested)  SHOULDER SPECIAL TESTS: Rotator cuff assessment: Full can test: positive    PALPATION:  Tender on palpation of L anterior shoulder, L AC joint, Supraspinatus tendon  TREATMENT DATE: 05/20/24 ***    Evaluation + HEP review Self-Care and Home Management: Pt provided with full verbal education as well as demonstration of HEP below. Pt returned all demonstrations with 10 reps of each exercise on HEP: Access Code: [email or web address]  URL: [email or web address]  Date: 05/16/2024  Prepared by: Marina  Moser  Exercises - Standing Shoulder Abduction Slides at Wall  - 1 x daily - 7 x weekly - 2 sets - 10 reps - 5 hold  - Standing shoulder flexion wall slides  - 1 x daily - 7 x weekly - 2 sets - 10 reps - 5 hold  - Seated Shoulder Pendulum Exercise  - 1 x daily - 7 x weekly - 2 sets - 10 reps - 5 hold    PATIENT EDUCATION: Education details: Pt educated on importance of maintaining movement in L shoulder for healing purposes and to prevent frozen shoulder. Pt educated on HEP and provided with full demonstration as well as cues to prevent pushing AROM passed point of severe pain Person educated: Patient Education method: Explanation, Demonstration, Verbal cues, and Handouts Education comprehension: verbalized understanding and returned demonstration  HOME EXERCISE PROGRAM: Access Code: [email or web address]  URL: [email or web address]  Date: 05/16/2024  Prepared by: Marina  Moser  Exercises - Standing Shoulder Abduction Slides at Wall  - 1 x daily - 7 x weekly - 2 sets - 10 reps - 5 hold  - Standing shoulder flexion wall slides  - 1 x daily - 7 x weekly - 2 sets - 10 reps - 5 hold  - Seated Shoulder Pendulum Exercise  - 1 x daily - 7 x weekly - 2 sets - 10 reps - 5 hold   ASSESSMENT:  CLINICAL IMPRESSION: ***  Patient is a 68 y.o. female who was seen today for physical therapy evaluation and treatment for L shoulder pain. Pt presented with significant pain in her L shoulder, with pt visually guarded at rest. Pt demonstrated limitations in L shoulder AROM, particularly with flexion and abduction. Strength testing deferred at eval due to AROM limitations preventing pt from achieving gold  standard testing positions. Pt's limitations have significantly impacted pt's ability to utilize her L UE for functional tasks such as driving, dressing, bathing, and reaching. Pt would benefit from skilled PT intervention to decrease her pain, improve her UE AROM & strength, and improve pt's ability to perform functional tasks and ADLs.   OBJECTIVE IMPAIRMENTS: decreased activity tolerance, decreased mobility, decreased ROM, decreased strength, hypomobility, impaired flexibility, impaired sensation, impaired UE functional use, improper body mechanics, postural dysfunction, and pain.   ACTIVITY LIMITATIONS: carrying, lifting, sleeping, transfers, bed mobility, bathing, toileting, dressing, self feeding, reach over head, hygiene/grooming, locomotion level, and caring for others  PARTICIPATION LIMITATIONS: meal prep, cleaning, laundry, driving, shopping, community activity, and yard work  PERSONAL FACTORS: Age, Profession, Time since onset of injury/illness/exacerbation, and 3+ comorbidities: abdominal hernia, aortic ejection murmur, chronic hepatitis C, depression, DM, GERD, headache, hepatic cirrhosis, HTN, anemia, irregular heart rhythm, palpitations are also affecting patient's functional outcome.   REHAB POTENTIAL: Good  CLINICAL DECISION MAKING: Evolving/moderate complexity  EVALUATION COMPLEXITY: Moderate   GOALS: Goals reviewed with patient? Yes  SHORT TERM GOALS: Target date: 06/06/2024   Patient will be independent in home exercise program to improve strength/mobility for better functional independence with ADLs. Baseline: see HEP above Goal status: INITIAL   LONG TERM GOALS: Target date: 07/11/2024  Patient will decrease Quick DASH score by > 8 points demonstrating reduced self-reported upper extremity disability. Baseline:  88.6 / 100 = 88.6 % Goal status: INITIAL  2.  Patient will improve L shoulder AROM to > 140 degrees of flexion and abduction for improved ability to  perform overhead activities. Baseline: flexion- 65 active, 120 passive; abduction- 60 active, 100 passive Goal status: INITIAL  3.  Pt will report worst shoulder pain of 3/10 on NPS to demonstrated improvements in pain and to allow for improved UE function Baseline: 7.5/10 Goal status: INITIAL  4.  Pt will increase L UE strength to 4+/5 or better to   Baseline: assess at future session Goal status: INITIAL   PLAN:  PT FREQUENCY: 2x/week  PT DURATION: 8 weeks  PLANNED INTERVENTIONS: [id number]- PT Re-evaluation, 97750- Physical Performance Testing, 97110-Therapeutic exercises, 97530- Therapeutic activity, [id number]- Neuromuscular re-education, 97535- Self Care, [id number]- Manual therapy, [id number]- Orthotic Initial, [id number]- Orthotic/Prosthetic subsequent, [id number]- Canalith repositioning, [id number]- Splinting, [id number]- Electrical stimulation (manual), [id number]- Ultrasound, [id number]- Traction (mechanical), 20560 (1-2 muscles), 20561 (3+ muscles)- Dry Needling, Patient/Family education, Balance training, Stair training, Taping, Joint mobilization, Spinal mobilization, Vestibular training, DME instructions, Cryotherapy, and Moist heat  PLAN FOR NEXT SESSION: Assess HEP compliance, add to HEP as necessary, L shoulder AROM, L shoulder manual therapy/joint mobilizations as tolerated for pain control, isometrics   Maryanne Finder, PT, DPT Physical Therapist - [place]  [facility] 05/20/2024, 8:25 AM

## 2024-05-21 ENCOUNTER — Ambulatory Visit: Admitting: Dermatology

## 2024-05-21 NOTE — Therapy (Unsigned)
 OUTPATIENT PHYSICAL THERAPY SHOULDER TREATMENT   Patient Name: Roberta Lane MRN: [id number] DOB:Jan 03, 1956, 68 y.o., female Today's Date: 05/22/2024  END OF SESSION:  PT End of Session - 05/22/24 0758     Visit Number 2    Number of Visits 16    Date for PT Re-Evaluation 07/11/24    PT Start Time 0802    PT Stop Time 0841    PT Time Calculation (min) 39 min    Activity Tolerance Patient tolerated treatment well;Patient limited by pain    Behavior During Therapy [facility] for tasks assessed/performed           Past Medical History:  Diagnosis Date   Abdominal hernia    Allergic genetic state    Aortic ejection murmur    Ascites    Chronic hepatitis C (HCC)    Depression    Diabetes mellitus without complication (HCC)    Dysrhythmia    GERD (gastroesophageal reflux disease)    GIB (gastrointestinal bleeding)    Headache    Hepatic cirrhosis (HCC)    Hepatitis C    Hernia, abdominal    Hyperlipidemia    Hypertension    IDA (iron  deficiency anemia)    Inflamed seborrheic keratosis    Interdigital neuroma of foot    Irregular heart rhythm    Liver disease    Multiple renal cysts    Palpitations    Pancytopenia (HCC)    Portal hypertension with esophageal varices (HCC)    Splenomegaly    UTI (urinary tract infection)    Past Surgical History:  Procedure Laterality Date   ABDOMINAL HYSTERECTOMY     CHOLECYSTECTOMY     COLONOSCOPY WITH PROPOFOL  N/A 12/05/2023   Procedure: COLONOSCOPY WITH PROPOFOL ;  Surgeon: Toledo, Ladell POUR, MD;  Location: [facility] ENDOSCOPY;  Service: Gastroenterology;  Laterality: N/A;  DM   ESOPHAGOGASTRODUODENOSCOPY N/A 02/17/2021   Procedure: ESOPHAGOGASTRODUODENOSCOPY (EGD);  Surgeon: Toledo, Ladell POUR, MD;  Location: [facility];  Service: Gastroenterology;  Laterality: N/A;   ESOPHAGOGASTRODUODENOSCOPY (EGD) WITH PROPOFOL  N/A 11/23/2017   Procedure: ESOPHAGOGASTRODUODENOSCOPY (EGD) WITH PROPOFOL ;  Surgeon: Toledo, Ladell POUR, MD;  Location:  [facility]OS[facility];  Service: Gastroenterology;  Laterality: N/A;   ESOPHAGOGASTRODUODENOSCOPY (EGD) WITH PROPOFOL  N/A 10/23/2018   Procedure: ESOPHAGOGASTRODUODENOSCOPY (EGD) WITH PROPOFOL ;  Surgeon: Toledo, Ladell POUR, MD;  Location: [facility];  Service: Gastroenterology;  Laterality: N/A;   ESOPHAGOGASTRODUODENOSCOPY (EGD) WITH PROPOFOL  N/A 10/10/2019   Procedure: ESOPHAGOGASTRODUODENOSCOPY (EGD) WITH PROPOFOL ;  Surgeon: Toledo, Ladell POUR, MD;  Location: [facility];  Service: Gastroenterology;  Laterality: N/A;   ESOPHAGOGASTRODUODENOSCOPY (EGD) WITH PROPOFOL  N/A 11/14/2019   Procedure: ESOPHAGOGASTRODUODENOSCOPY (EGD) WITH PROPOFOL ;  Surgeon: Toledo, Ladell POUR, MD;  Location: [facility];  Service: Gastroenterology;  Laterality: N/A;   ESOPHAGOGASTRODUODENOSCOPY (EGD) WITH PROPOFOL  N/A 12/05/2023   Procedure: ESOPHAGOGASTRODUODENOSCOPY (EGD) WITH PROPOFOL ;  Surgeon: Toledo, Ladell POUR, MD;  Location: [facility];  Service: Gastroenterology;  Laterality: N/A;   right elbow surgery     WISDOM TOOTH EXTRACTION     Patient Active Problem List   Diagnosis Date Noted   Renal mass, left 04/11/2020   Hypertension 03/18/2020   Hepatic cirrhosis due to chronic hepatitis C infection (HCC) 11/28/2019   Chronic pain of both knees 03/26/2019   Other ascites 03/26/2019   Aortic stenosis, mild 01/07/2019   Iron  deficiency anemia 12/31/2018   Hepatic encephalopathy (HCC) 07/20/2018   Frequent PVCs 06/13/2018   Portal hypertension with esophageal varices (HCC) 06/12/2018   Aortic ejection murmur 05/29/2018   Dizziness  05/29/2018   Hyperlipidemia, mixed 05/29/2018   Palpitations 05/29/2018   Controlled type 2 diabetes mellitus without complication, without long-term current use of insulin  (HCC) 05/17/2018   Abdominal pain, diffuse 12/14/2017   Gastroesophageal reflux disease without esophagitis 12/14/2017   Pancytopenia (HCC) 12/10/2017   GIB (gastrointestinal bleeding) 11/21/2017    PCP:  Lynwood Null, MD  REFERRING PROVIDER: Ozell Flake, MD  REFERRING DIAG: S46.012D (ICD-10-CM) - Strain of muscle(s) and tendon(s) of the rotator cuff of left shoulder, subsequent encounter   THERAPY DIAG:  Muscle weakness (generalized)  Chronic left shoulder pain  Decreased range of motion of shoulder, left  Rationale for Evaluation and Treatment: Rehabilitation  ONSET DATE: October 2024  SUBJECTIVE:                                                                                                                                                                                      SUBJECTIVE STATEMENT:   Patient reports after initial evaluation she was a little sore that day but felt great over the weekend.  Patient has not had time to do her home exercises but was encouraged to do so.  PERTINENT HISTORY: Pt was seen by PCP at [facility] on 04/11/24 with c/c of L shoulder pain that has progressively worsened after a fall pt sustained in October of 2024. Pt states that she lifted her 35 pound suitcase onto the shuttle bus at the airport in [place] and she caught her foot on the step of the shuttle bus and pulled her suitcase and it fell backward onto her shoulder. PCP referred pt to orthopedic surgery, who saw pt on 04/17/24. Per orthopedic surgery note from 6/25: A suspected rotator cuff tear in the left shoulder followed a fall in October. Persistent pain radiating to the forearm and thumb suggests possible nerve involvement. X-ray shows mild AC degenerative changes with slight separation, but no calcific tendonitis or significant glenohumeral degenerative change. Physical examination reveals painful and weak shoulder movements, indicating a rotator cuff tear. MRI was ordered by orthopedic surgeon, with findings suggestive of mild to moderate insertional supraspinatus tendinopathy & probable small interstitial tear to anterior insertion without a full-thickness tear.  PMH  significant for abdominal hernia, aortic ejection murmur, chronic hepatitis C, depression, DM, GERD, headache, hepatic cirrhosis, HTN, anemia, irregular heart rhythm, palpitations  Pt states that the orthopedic surgeon wants her to do 4 weeks of PT and if the pain does not improve then they will do surgery.   PAIN:  Are you having pain? Yes: NPRS scale: 7.5/10 currently Pain location: Anterior shoulder, into lateral deltoid Pain description: aching Aggravating factors: Reaching overhead, behind her, forward Relieving  factors: Biofreeze helped initially  PRECAUTIONS: None  RED FLAGS: None   WEIGHT BEARING RESTRICTIONS: No  FALLS:  Has patient fallen in last 6 months? 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  DIAGNOSTIC FINDINGS:  MRI Shoulder Left WO Contrast (from 04/21/24):  FINDINGS: There is mild to moderate insertional tendinopathy of the supraspinatus tendon and a small interstitial tear to the anterior insertion. No retraction or full-thickness tear. Mild impingement change deep to the infraspinatus insertion. The subscapularis and teres minor tendons are unremarkable. There is mild-to-moderate tendinopathy of the intra-articular biceps tendon greater distally. Fibers are intact.   There is no labral tear. Mild glenohumeral and acromioclavicular  osteoarthritis with joint space narrowing and mild degenerative edema.   IMPRESSION: Mild to moderate insertional tendinopathy of the supraspinatus tendon and a probable small interstitial partial tear to the anterior insertion without retraction or full-thickness tear.   Mild-to-moderate tendinopathy of the intra-articular biceps tendon.   Mild glenohumeral and acromioclavicular osteoarthritis.  PATIENT SURVEYS:  QuickDASH: 88.6 / 100 = 88.6 %  COGNITION: Overall cognitive status: Within functional limits for tasks assessed     SENSATION: WFL Light touch: Pt reports that it feels duller on her L side around her shoulder  POSTURE: Slight forward head, rounded shoulders- pt holds L UE in guarded position against body at rest  CERVICAL ROM: all motions appeared WFL, pt has pain at end range extension and end range rotation to L  UPPER EXTREMITY ROM:    Active ROM Right eval Left eval  Shoulder flexion 150 65* active, 120 passive  Shoulder extension    Shoulder abduction 165 60* active, 100 passive  Shoulder adduction    Shoulder internal rotation 60 53  Shoulder external rotation 75 50  Elbow flexion    Elbow extension    Wrist flexion    Wrist extension    Wrist ulnar deviation    Wrist radial deviation    Wrist pronation    Wrist supination    (Blank rows = not tested)  UPPER EXTREMITY MMT: Unable to assess at eval due to significant pain/ROM limitations  MMT Right eval Left eval  Shoulder flexion    Shoulder extension    Shoulder abduction    Shoulder adduction    Shoulder internal rotation    Shoulder external rotation    Middle trapezius    Lower trapezius    Elbow flexion    Elbow extension    Wrist flexion    Wrist extension    Wrist ulnar deviation    Wrist radial deviation    Wrist pronation    Wrist supination    Grip strength (lbs)    (Blank rows = not tested)  SHOULDER SPECIAL TESTS: Rotator cuff assessment: Full can test: positive     PALPATION:  Tender on palpation of L anterior shoulder, L AC joint, Supraspinatus  tendon                                                                                                                             TREATMENT DATE: 05/22/24 Manual:   L UT ischemic tp release x 3 min L supraspinatus ischemic TP release and STM x 3 min    TE- To improve strength, endurance, mobility, and function of specific targeted muscle groups or improve joint range of motion or improve muscle flexibility  PROM shoulder abduction, cues for relaxation x 5 min  L UT stretch with GH depression 2 x 45 sec   L supine isometric row 2 x 10 with pillow under arm   Transition to seated with mat table anterior to patient.  Performed 10 times table slides with 5-second hold with instruction for proper form to prevent onset of pain. In addition to patient perpendicular to table to perform 10 times abduction table slides with washcloth under hand, some pain getting into position but once performing exercises no reports of increased pain.  Self care:   New HEP handout provided and all questions answered regarding frequency of completing and proper outcome from the exercises   PATIENT EDUCATION: Education details: Pt educated on importance of maintaining movement in L shoulder for healing purposes and to prevent frozen shoulder. Pt educated on HEP and provided with full demonstration as well as cues to prevent pushing AROM passed point of severe pain Person educated: Patient Education method: Explanation, Demonstration, Verbal cues, and Handouts Education comprehension: verbalized understanding and returned demonstration  HOME EXERCISE PROGRAM: Access Code: [email or web address] URL: [email or web address] Date: 05/22/2024 Prepared by: Lonni Gainer  Exercises - Seated Shoulder Flexion Towel Slide at Table Top  - 2 x daily - 7 x weekly - 10 reps - 5 sec hold - Seated Shoulder Abduction Towel Slide at  Table Top  - 2 x daily - 7 x weekly - 10 reps - 5 sec hold - Supine Isometric Shoulder Extension with Towel  - 2 x daily - 7 x weekly - 2 sets - 10 reps  ASSESSMENT:  CLINICAL IMPRESSION: Patient presents with good motivation for completion of physical therapy activities.  Patient notes improvement in her shoulder since initial evaluation but still having pain at times.  Patient instructed in new home exercise program where she has not had to utilize a wall because she cannot find a good wall to utilize.  Patient provided with new handout as well as new instruction for frequency of completion.Pt will continue to benefit from skilled physical therapy intervention to address impairments, improve QOL, and attain therapy goals.    OBJECTIVE IMPAIRMENTS: decreased activity tolerance, decreased mobility, decreased ROM, decreased strength, hypomobility, impaired flexibility, impaired sensation, impaired UE functional use, improper body mechanics, postural dysfunction, and pain.   ACTIVITY LIMITATIONS: carrying, lifting, sleeping, transfers, bed mobility, bathing, toileting, dressing, self feeding, reach over head, hygiene/grooming, locomotion level, and caring for others  PARTICIPATION LIMITATIONS: meal prep, cleaning,  laundry, driving, shopping, community activity, and yard work  PERSONAL FACTORS: Age, Profession, Time since onset of injury/illness/exacerbation, and 3+ comorbidities: abdominal hernia, aortic ejection murmur, chronic hepatitis C, depression, DM, GERD, headache, hepatic cirrhosis, HTN, anemia, irregular heart rhythm, palpitations are also affecting patient's functional outcome.   REHAB POTENTIAL: Good  CLINICAL DECISION MAKING: Evolving/moderate complexity  EVALUATION COMPLEXITY: Moderate   GOALS: Goals reviewed with patient? Yes  SHORT TERM GOALS: Target date: 06/06/2024   Patient will be independent in home exercise program to improve strength/mobility for better functional  independence with ADLs. Baseline: see HEP above Goal status: INITIAL   LONG TERM GOALS: Target date: 07/11/2024  Patient will decrease Quick DASH score by > 8 points demonstrating reduced self-reported upper extremity disability. Baseline: 88.6 / 100 = 88.6 % Goal status: INITIAL  2.  Patient will improve L shoulder AROM to > 140 degrees of flexion and abduction for improved ability to perform overhead activities. Baseline: flexion- 65 active, 120 passive; abduction- 60 active, 100 passive Goal status: INITIAL  3.  Pt will report worst shoulder pain of 3/10 on NPS to demonstrated improvements in pain and to allow for improved UE function Baseline: 7.5/10 Goal status: INITIAL  4.  Pt will increase L UE strength to 4+/5 or better to   Baseline: assess at future session Goal status: INITIAL   PLAN:  PT FREQUENCY: 2x/week  PT DURATION: 8 weeks  PLANNED INTERVENTIONS: 97164- PT Re-evaluation, 97750- Physical Performance Testing, 97110-Therapeutic exercises, 97530- Therapeutic activity, [id number]- Neuromuscular re-education, 97535- Self Care, [id number]- Manual therapy, [id number]- Orthotic Initial, [id number]- Orthotic/Prosthetic subsequent, [id number]- Canalith repositioning, [id number]- Splinting, [id number]- Electrical stimulation (manual), [id number]- Ultrasound, [id number]- Traction (mechanical), 20560 (1-2 muscles), 20561 (3+ muscles)- Dry Needling, Patient/Family education, Balance training, Stair training, Taping, Joint mobilization, Spinal mobilization, Vestibular training, DME instructions, Cryotherapy, and Moist heat  PLAN FOR NEXT SESSION: Assess HEP compliance, add to HEP as necessary, L shoulder AROM, L shoulder manual therapy/joint mobilizations as tolerated for pain control, isometrics   Note: Portions of this document were prepared using Dragon voice recognition software and although reviewed may contain unintentional dictation errors in syntax, grammar, or spelling.  Lonni KATHEE Gainer PT ,DPT Physical  Therapist- [facility]   05/22/2024, 9:39 AM

## 2024-05-22 ENCOUNTER — Ambulatory Visit: Admitting: Physical Therapy

## 2024-05-22 DIAGNOSIS — G8929 Other chronic pain: Secondary | ICD-10-CM

## 2024-05-22 DIAGNOSIS — M6281 Muscle weakness (generalized): Secondary | ICD-10-CM

## 2024-05-22 DIAGNOSIS — M25612 Stiffness of left shoulder, not elsewhere classified: Secondary | ICD-10-CM

## 2024-05-22 DIAGNOSIS — M25512 Pain in left shoulder: Secondary | ICD-10-CM | POA: Diagnosis not present

## 2024-05-27 NOTE — Therapy (Incomplete)
 OUTPATIENT PHYSICAL THERAPY SHOULDER TREATMENT   Patient Name: Roberta Lane MRN: [id number] DOB:1956-04-08, 68 y.o., female Today's Date: 05/27/2024  END OF SESSION:     Past Medical History:  Diagnosis Date   Abdominal hernia    Allergic genetic state    Aortic ejection murmur    Ascites    Chronic hepatitis C (HCC)    Depression    Diabetes mellitus without complication (HCC)    Dysrhythmia    GERD (gastroesophageal reflux disease)    GIB (gastrointestinal bleeding)    Headache    Hepatic cirrhosis (HCC)    Hepatitis C    Hernia, abdominal    Hyperlipidemia    Hypertension    IDA (iron  deficiency anemia)    Inflamed seborrheic keratosis    Interdigital neuroma of foot    Irregular heart rhythm    Liver disease    Multiple renal cysts    Palpitations    Pancytopenia (HCC)    Portal hypertension with esophageal varices (HCC)    Splenomegaly    UTI (urinary tract infection)    Past Surgical History:  Procedure Laterality Date   ABDOMINAL HYSTERECTOMY     CHOLECYSTECTOMY     COLONOSCOPY WITH PROPOFOL  N/A 12/05/2023   Procedure: COLONOSCOPY WITH PROPOFOL ;  Surgeon: Toledo, Ladell POUR, MD;  Location: [facility];  Service: Gastroenterology;  Laterality: N/A;  DM   ESOPHAGOGASTRODUODENOSCOPY N/A 02/17/2021   Procedure: ESOPHAGOGASTRODUODENOSCOPY (EGD);  Surgeon: Toledo, Ladell POUR, MD;  Location: [facility];  Service: Gastroenterology;  Laterality: N/A;   ESOPHAGOGASTRODUODENOSCOPY (EGD) WITH PROPOFOL  N/A 11/23/2017   Procedure: ESOPHAGOGASTRODUODENOSCOPY (EGD) WITH PROPOFOL ;  Surgeon: Toledo, Ladell POUR, MD;  Location: [facility];  Service: Gastroenterology;  Laterality: N/A;   ESOPHAGOGASTRODUODENOSCOPY (EGD) WITH PROPOFOL  N/A 10/23/2018   Procedure: ESOPHAGOGASTRODUODENOSCOPY (EGD) WITH PROPOFOL ;  Surgeon: Toledo, Ladell POUR, MD;  Location: [facility];  Service: Gastroenterology;  Laterality: N/A;   ESOPHAGOGASTRODUODENOSCOPY (EGD) WITH PROPOFOL  N/A  10/10/2019   Procedure: ESOPHAGOGASTRODUODENOSCOPY (EGD) WITH PROPOFOL ;  Surgeon: Toledo, Ladell POUR, MD;  Location: [facility];  Service: Gastroenterology;  Laterality: N/A;   ESOPHAGOGASTRODUODENOSCOPY (EGD) WITH PROPOFOL  N/A 11/14/2019   Procedure: ESOPHAGOGASTRODUODENOSCOPY (EGD) WITH PROPOFOL ;  Surgeon: Toledo, Ladell POUR, MD;  Location: [facility];  Service: Gastroenterology;  Laterality: N/A;   ESOPHAGOGASTRODUODENOSCOPY (EGD) WITH PROPOFOL  N/A 12/05/2023   Procedure: ESOPHAGOGASTRODUODENOSCOPY (EGD) WITH PROPOFOL ;  Surgeon: Toledo, Ladell POUR, MD;  Location: [facility];  Service: Gastroenterology;  Laterality: N/A;   right elbow surgery     WISDOM TOOTH EXTRACTION     Patient Active Problem List   Diagnosis Date Noted   Renal mass, left 04/11/2020   Hypertension 03/18/2020   Hepatic cirrhosis due to chronic hepatitis C infection (HCC) 11/28/2019   Chronic pain of both knees 03/26/2019   Other ascites 03/26/2019   Aortic stenosis, mild 01/07/2019   Iron  deficiency anemia 12/31/2018   Hepatic encephalopathy (HCC) 07/20/2018   Frequent PVCs 06/13/2018   Portal hypertension with esophageal varices (HCC) 06/12/2018   Aortic ejection murmur 05/29/2018   Dizziness 05/29/2018   Hyperlipidemia, mixed 05/29/2018   Palpitations 05/29/2018   Controlled type 2 diabetes mellitus without complication, without long-term current use of insulin  (HCC) 05/17/2018   Abdominal pain, diffuse 12/14/2017   Gastroesophageal reflux disease without esophagitis 12/14/2017   Pancytopenia (HCC) 12/10/2017   GIB (gastrointestinal bleeding) 11/21/2017    PCP: Lynwood Null, MD  REFERRING PROVIDER: Ozell Flake, MD  REFERRING DIAG: S46.012D (ICD-10-CM) - Strain of muscle(s) and tendon(s) of the rotator cuff of  left shoulder, subsequent encounter   THERAPY DIAG:  No diagnosis found.  Rationale for Evaluation and Treatment: Rehabilitation  ONSET DATE: October 2024  SUBJECTIVE:                                                                                                                                                                                       SUBJECTIVE STATEMENT:   ***  PERTINENT HISTORY: Pt was seen by PCP at [facility] on 04/11/24 with c/c of L shoulder pain that has progressively worsened after a fall pt sustained in October of 2024. Pt states that she lifted her 35 pound suitcase onto the shuttle bus at the airport in [place] and she caught her foot on the step of the shuttle bus and pulled her suitcase and it fell backward onto her shoulder. PCP referred pt to orthopedic surgery, who saw pt on 04/17/24. Per orthopedic surgery note from 6/25: A suspected rotator cuff tear in the left shoulder followed a fall in October. Persistent pain radiating to the forearm and thumb suggests possible nerve involvement. X-ray shows mild AC degenerative changes with slight separation, but no calcific tendonitis or significant glenohumeral degenerative change. Physical examination reveals painful and weak shoulder movements, indicating a rotator cuff tear. MRI was ordered by orthopedic surgeon, with findings suggestive of mild to moderate insertional supraspinatus tendinopathy & probable small interstitial tear to anterior insertion without a full-thickness tear.  PMH significant for abdominal hernia, aortic ejection murmur, chronic hepatitis C, depression, DM, GERD, headache, hepatic cirrhosis, HTN, anemia, irregular heart rhythm, palpitations  Pt states that the orthopedic surgeon wants her to do 4 weeks of PT and if the pain does not improve then they will do surgery.   PAIN:  Are you having pain? Yes: NPRS scale: 7.5/10 currently Pain location: Anterior shoulder, into lateral deltoid Pain description: aching Aggravating factors: Reaching overhead, behind her, forward Relieving factors: Biofreeze helped initially  PRECAUTIONS: None  RED FLAGS: None   WEIGHT  BEARING RESTRICTIONS: No  FALLS:  Has patient fallen in last 6 months? 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  DIAGNOSTIC FINDINGS:  MRI Shoulder Left WO Contrast (from 04/21/24):  FINDINGS: There is mild to moderate insertional tendinopathy of the supraspinatus tendon and a small interstitial tear to the anterior insertion. No retraction or full-thickness tear. Mild impingement change deep to the infraspinatus insertion. The subscapularis and teres minor tendons are unremarkable. There is mild-to-moderate tendinopathy of the intra-articular biceps tendon greater distally. Fibers are intact.   There is no labral tear. Mild glenohumeral and acromioclavicular osteoarthritis with joint space narrowing and mild degenerative edema.   IMPRESSION: Mild to moderate insertional tendinopathy of the supraspinatus tendon and a probable small interstitial partial tear to the anterior insertion without retraction or full-thickness tear.   Mild-to-moderate tendinopathy of the intra-articular biceps tendon.   Mild glenohumeral and acromioclavicular osteoarthritis.  PATIENT SURVEYS:  QuickDASH: 88.6 / 100 = 88.6 %  COGNITION: Overall cognitive status: Within functional limits for tasks assessed     SENSATION: WFL Light touch: Pt reports that it feels duller on her L side  around her shoulder  POSTURE: Slight forward head, rounded shoulders- pt holds L UE in guarded position against body at rest  CERVICAL ROM: all motions appeared WFL, pt has pain at end range extension and end range rotation to L  UPPER EXTREMITY ROM:    Active ROM Right eval Left eval  Shoulder flexion 150 65* active, 120 passive  Shoulder extension    Shoulder abduction 165 60* active, 100 passive  Shoulder adduction    Shoulder internal rotation 60 53  Shoulder external rotation 75 50  Elbow flexion    Elbow extension    Wrist flexion    Wrist extension    Wrist ulnar deviation    Wrist radial deviation    Wrist pronation    Wrist supination    (Blank rows = not tested)  UPPER EXTREMITY MMT: Unable to assess at eval due to significant pain/ROM limitations  MMT Right eval Left eval  Shoulder flexion    Shoulder extension    Shoulder abduction    Shoulder adduction    Shoulder internal rotation    Shoulder external rotation    Middle trapezius    Lower trapezius    Elbow flexion    Elbow extension    Wrist flexion    Wrist extension    Wrist ulnar deviation    Wrist radial deviation    Wrist pronation    Wrist supination    Grip strength (lbs)    (Blank rows = not tested)  SHOULDER SPECIAL TESTS: Rotator cuff assessment: Full can test: positive    PALPATION:  Tender on palpation of L anterior shoulder, L AC joint, Supraspinatus tendon                                                                                                                             TREATMENT DATE: 05/27/24  ***  Manual:   L UT ischemic tp release x 3 min L supraspinatus ischemic TP release and STM x 3 min    TE- To improve strength, endurance, mobility, and function of specific targeted muscle groups or improve joint range of motion or improve muscle flexibility  PROM shoulder abduction, cues for relaxation x 5 min  L UT stretch with GH depression 2 x 45 sec   L supine  isometric row 2 x 10 with pillow under arm   Transition to seated with mat table anterior to patient.  Performed 10 times table slides with 5-second hold with instruction for proper form to prevent onset of pain. In addition to patient perpendicular to table to perform 10 times abduction table slides with washcloth under hand, some pain getting into position but once performing exercises no reports of increased pain.  Self care:   New HEP handout provided and all questions answered regarding frequency of completing and proper outcome from the exercises   PATIENT EDUCATION: Education details: Pt educated on importance of maintaining movement in L shoulder for healing purposes and to prevent frozen shoulder. Pt educated on HEP and provided with full demonstration as well as cues to prevent pushing AROM passed point of severe pain Person educated: Patient Education method: Explanation, Demonstration, Verbal cues, and Handouts Education comprehension: verbalized understanding and returned demonstration  HOME EXERCISE PROGRAM: Access Code: [email or web address] URL: [email or web address] Date: 05/22/2024 Prepared by: Lonni Gainer  Exercises - Seated Shoulder Flexion Towel Slide at Table Top  - 2 x daily - 7 x weekly - 10 reps - 5 sec hold - Seated Shoulder Abduction Towel Slide at Table Top  - 2 x daily - 7 x weekly - 10 reps - 5 sec hold - Supine Isometric Shoulder Extension with Towel  - 2 x daily - 7 x weekly - 2 sets - 10 reps  ASSESSMENT:  CLINICAL IMPRESSION:   ***   OBJECTIVE IMPAIRMENTS: decreased activity tolerance, decreased mobility, decreased ROM, decreased strength, hypomobility, impaired flexibility, impaired sensation, impaired UE functional use, improper body mechanics, postural dysfunction, and pain.   ACTIVITY LIMITATIONS: carrying, lifting, sleeping, transfers, bed mobility, bathing, toileting, dressing, self feeding, reach over head, hygiene/grooming, locomotion  level, and caring for others  PARTICIPATION LIMITATIONS: meal prep, cleaning, laundry, driving, shopping, community activity, and yard work  PERSONAL FACTORS: Age, Profession, Time since onset of injury/illness/exacerbation, and 3+ comorbidities: abdominal hernia, aortic ejection murmur, chronic hepatitis C, depression, DM, GERD, headache, hepatic cirrhosis, HTN, anemia, irregular heart rhythm, palpitations are also affecting patient's functional outcome.   REHAB POTENTIAL: Good  CLINICAL DECISION MAKING: Evolving/moderate complexity  EVALUATION COMPLEXITY: Moderate   GOALS: Goals reviewed with patient? Yes  SHORT TERM GOALS: Target date: 06/06/2024   Patient will be independent in home exercise program to improve strength/mobility for better functional independence with ADLs. Baseline: see HEP above Goal status: INITIAL   LONG TERM GOALS: Target date: 07/11/2024  Patient will decrease Quick DASH score by > 8 points demonstrating reduced self-reported upper extremity disability. Baseline: 88.6 / 100 = 88.6 % Goal status: INITIAL  2.  Patient will improve L shoulder AROM to > 140 degrees of flexion and abduction for improved ability to perform overhead activities. Baseline: flexion- 65 active, 120 passive; abduction- 60 active, 100 passive Goal status: INITIAL  3.  Pt will report worst shoulder pain of 3/10 on NPS to demonstrated improvements in pain and to allow for improved UE function Baseline: 7.5/10 Goal status: INITIAL  4.  Pt will  increase L UE strength to 4+/5 or better to   Baseline: assess at future session Goal status: INITIAL   PLAN:  PT FREQUENCY: 2x/week  PT DURATION: 8 weeks  PLANNED INTERVENTIONS: [id number]- PT Re-evaluation, 97750- Physical Performance Testing, 97110-Therapeutic exercises, 97530- Therapeutic activity, [id number]- Neuromuscular re-education, 97535- Self Care, [id number]- Manual therapy, [id number]- Orthotic Initial, [id number]- Orthotic/Prosthetic subsequent,  [id number]- Canalith repositioning, [id number]- Splinting, [id number]- Electrical stimulation (manual), [id number]- Ultrasound, [id number]- Traction (mechanical), 20560 (1-2 muscles), 20561 (3+ muscles)- Dry Needling, Patient/Family education, Balance training, Stair training, Taping, Joint mobilization, Spinal mobilization, Vestibular training, DME instructions, Cryotherapy, and Moist heat  PLAN FOR NEXT SESSION:   *** Assess HEP compliance, add to HEP as necessary, L shoulder AROM, L shoulder manual therapy/joint mobilizations as tolerated for pain control, isometrics   Fonda Simpers, PT, DPT Physical Therapist - [facility]  [facility]  05/27/24, 10:24 PM

## 2024-05-28 ENCOUNTER — Ambulatory Visit: Attending: Orthopedic Surgery

## 2024-05-28 DIAGNOSIS — M6281 Muscle weakness (generalized): Secondary | ICD-10-CM | POA: Insufficient documentation

## 2024-05-28 DIAGNOSIS — M25512 Pain in left shoulder: Secondary | ICD-10-CM | POA: Insufficient documentation

## 2024-05-28 DIAGNOSIS — M25612 Stiffness of left shoulder, not elsewhere classified: Secondary | ICD-10-CM | POA: Insufficient documentation

## 2024-05-28 DIAGNOSIS — G8929 Other chronic pain: Secondary | ICD-10-CM | POA: Insufficient documentation

## 2024-05-29 ENCOUNTER — Ambulatory Visit: Admitting: Dermatology

## 2024-05-30 ENCOUNTER — Ambulatory Visit

## 2024-06-03 ENCOUNTER — Ambulatory Visit: Admitting: Physical Therapy

## 2024-06-03 DIAGNOSIS — M6281 Muscle weakness (generalized): Secondary | ICD-10-CM

## 2024-06-03 DIAGNOSIS — M25512 Pain in left shoulder: Secondary | ICD-10-CM | POA: Diagnosis present

## 2024-06-03 DIAGNOSIS — M25612 Stiffness of left shoulder, not elsewhere classified: Secondary | ICD-10-CM

## 2024-06-03 DIAGNOSIS — G8929 Other chronic pain: Secondary | ICD-10-CM | POA: Diagnosis present

## 2024-06-03 NOTE — Therapy (Signed)
 OUTPATIENT PHYSICAL THERAPY SHOULDER TREATMENT   Patient Name: Roberta Lane MRN: [id number] DOB:06-30-1956, 68 y.o., female Today's Date: 06/03/2024  END OF SESSION:  PT End of Session - 06/03/24 1452     Visit Number 3    Number of Visits 16    Date for PT Re-Evaluation 07/11/24    Progress Note Due on Visit 10    PT Start Time 1448    PT Stop Time 1528    PT Time Calculation (min) 40 min    Activity Tolerance Patient tolerated treatment well;Patient limited by pain    Behavior During Therapy WFL for tasks assessed/performed            Past Medical History:  Diagnosis Date   Abdominal hernia    Allergic genetic state    Aortic ejection murmur    Ascites    Chronic hepatitis C (HCC)    Depression    Diabetes mellitus without complication (HCC)    Dysrhythmia    GERD (gastroesophageal reflux disease)    GIB (gastrointestinal bleeding)    Headache    Hepatic cirrhosis (HCC)    Hepatitis C    Hernia, abdominal    Hyperlipidemia    Hypertension    IDA (iron  deficiency anemia)    Inflamed seborrheic keratosis    Interdigital neuroma of foot    Irregular heart rhythm    Liver disease    Multiple renal cysts    Palpitations    Pancytopenia (HCC)    Portal hypertension with esophageal varices (HCC)    Splenomegaly    UTI (urinary tract infection)    Past Surgical History:  Procedure Laterality Date   ABDOMINAL HYSTERECTOMY     CHOLECYSTECTOMY     COLONOSCOPY WITH PROPOFOL  N/A 12/05/2023   Procedure: COLONOSCOPY WITH PROPOFOL ;  Surgeon: Toledo, Ladell POUR, MD;  Location: [facility];  Service: Gastroenterology;  Laterality: N/A;  DM   ESOPHAGOGASTRODUODENOSCOPY N/A 02/17/2021   Procedure: ESOPHAGOGASTRODUODENOSCOPY (EGD);  Surgeon: Toledo, Ladell POUR, MD;  Location: [facility];  Service: Gastroenterology;  Laterality: N/A;   ESOPHAGOGASTRODUODENOSCOPY (EGD) WITH PROPOFOL  N/A 11/23/2017   Procedure: ESOPHAGOGASTRODUODENOSCOPY (EGD) WITH PROPOFOL ;   Surgeon: Toledo, Ladell POUR, MD;  Location: [facility];  Service: Gastroenterology;  Laterality: N/A;   ESOPHAGOGASTRODUODENOSCOPY (EGD) WITH PROPOFOL  N/A 10/23/2018   Procedure: ESOPHAGOGASTRODUODENOSCOPY (EGD) WITH PROPOFOL ;  Surgeon: Toledo, Ladell POUR, MD;  Location: [facility];  Service: Gastroenterology;  Laterality: N/A;   ESOPHAGOGASTRODUODENOSCOPY (EGD) WITH PROPOFOL  N/A 10/10/2019   Procedure: ESOPHAGOGASTRODUODENOSCOPY (EGD) WITH PROPOFOL ;  Surgeon: Toledo, Ladell POUR, MD;  Location: [facility];  Service: Gastroenterology;  Laterality: N/A;   ESOPHAGOGASTRODUODENOSCOPY (EGD) WITH PROPOFOL  N/A 11/14/2019   Procedure: ESOPHAGOGASTRODUODENOSCOPY (EGD) WITH PROPOFOL ;  Surgeon: Toledo, Ladell POUR, MD;  Location: [facility];  Service: Gastroenterology;  Laterality: N/A;   ESOPHAGOGASTRODUODENOSCOPY (EGD) WITH PROPOFOL  N/A 12/05/2023   Procedure: ESOPHAGOGASTRODUODENOSCOPY (EGD) WITH PROPOFOL ;  Surgeon: Toledo, Ladell POUR, MD;  Location: [facility];  Service: Gastroenterology;  Laterality: N/A;   right elbow surgery     WISDOM TOOTH EXTRACTION     Patient Active Problem List   Diagnosis Date Noted   Renal mass, left 04/11/2020   Hypertension 03/18/2020   Hepatic cirrhosis due to chronic hepatitis C infection (HCC) 11/28/2019   Chronic pain of both knees 03/26/2019   Other ascites 03/26/2019   Aortic stenosis, mild 01/07/2019   Iron  deficiency anemia 12/31/2018   Hepatic encephalopathy (HCC) 07/20/2018   Frequent PVCs 06/13/2018   Portal hypertension with esophageal varices (HCC)  06/12/2018   Aortic ejection murmur 05/29/2018   Dizziness 05/29/2018   Hyperlipidemia, mixed 05/29/2018   Palpitations 05/29/2018   Controlled type 2 diabetes mellitus without complication, without long-term current use of insulin  (HCC) 05/17/2018   Abdominal pain, diffuse 12/14/2017   Gastroesophageal reflux disease without esophagitis 12/14/2017   Pancytopenia (HCC) 12/10/2017   GIB  (gastrointestinal bleeding) 11/21/2017    PCP: Lynwood Null, MD  REFERRING PROVIDER: Ozell Flake, MD  REFERRING DIAG: S46.012D (ICD-10-CM) - Strain of muscle(s) and tendon(s) of the rotator cuff of left shoulder, subsequent encounter   THERAPY DIAG:  No diagnosis found.  Rationale for Evaluation and Treatment: Rehabilitation  ONSET DATE: October 2024  SUBJECTIVE:                                                                                                                                                                                      SUBJECTIVE STATEMENT:   Patient reports her shoulder has been feeling relatively well.  Patient reports she is coming off of an illness where she is not sure what was wrong but she is feeling somewhat better now.  Still being cautious with some things by wearing a mask at this date.  PERTINENT HISTORY: Pt was seen by PCP at [facility] on 04/11/24 with c/c of L shoulder pain that has progressively worsened after a fall pt sustained in October of 2024. Pt states that she lifted her 35 pound suitcase onto the shuttle bus at the airport in [place] and she caught her foot on the step of the shuttle bus and pulled her suitcase and it fell backward onto her shoulder. PCP referred pt to orthopedic surgery, who saw pt on 04/17/24. Per orthopedic surgery note from 6/25: A suspected rotator cuff tear in the left shoulder followed a fall in October. Persistent pain radiating to the forearm and thumb suggests possible nerve involvement. X-ray shows mild AC degenerative changes with slight separation, but no calcific tendonitis or significant glenohumeral degenerative change. Physical examination reveals painful and weak shoulder movements, indicating a rotator cuff tear. MRI was ordered by orthopedic surgeon, with findings suggestive of mild to moderate insertional supraspinatus tendinopathy & probable small interstitial tear to anterior insertion without a  full-thickness tear.  PMH significant for abdominal hernia, aortic ejection murmur, chronic hepatitis C, depression, DM, GERD, headache, hepatic cirrhosis, HTN, anemia, irregular heart rhythm, palpitations  Pt states that the orthopedic surgeon wants her to do 4 weeks of PT and if the pain does not improve then they will do surgery.   PAIN:  Are you having pain? Yes: NPRS scale: 7.5/10 currently Pain location: Anterior shoulder, into lateral deltoid Pain  description: aching Aggravating factors: Reaching overhead, behind her, forward Relieving factors: Biofreeze helped initially  PRECAUTIONS: None  RED FLAGS: None   WEIGHT BEARING RESTRICTIONS: No  FALLS:  Has patient fallen in last 6 months? 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  DIAGNOSTIC FINDINGS:  MRI Shoulder Left WO Contrast (from 04/21/24):  FINDINGS: There is mild to moderate insertional tendinopathy of the supraspinatus tendon and a small interstitial tear to the anterior insertion. No retraction or full-thickness tear. Mild impingement change deep to the infraspinatus insertion. The subscapularis and teres minor tendons are unremarkable. There is mild-to-moderate tendinopathy of the intra-articular biceps tendon greater distally. Fibers are intact.   There is no labral tear. Mild glenohumeral and  acromioclavicular osteoarthritis with joint space narrowing and mild degenerative edema.   IMPRESSION: Mild to moderate insertional tendinopathy of the supraspinatus tendon and a probable small interstitial partial tear to the anterior insertion without retraction or full-thickness tear.   Mild-to-moderate tendinopathy of the intra-articular biceps tendon.   Mild glenohumeral and acromioclavicular osteoarthritis.  PATIENT SURVEYS:  QuickDASH: 88.6 / 100 = 88.6 %  COGNITION: Overall cognitive status: Within functional limits for tasks assessed     SENSATION: WFL Light touch: Pt reports that it feels duller on her L side around her shoulder  POSTURE: Slight forward head, rounded shoulders- pt holds L UE in guarded position against body at rest  CERVICAL ROM: all motions appeared WFL, pt has pain at end range extension and end range rotation to L  UPPER EXTREMITY ROM:    Active ROM Right eval Left eval  Shoulder flexion 150 65* active, 120 passive  Shoulder extension    Shoulder abduction 165 60* active, 100 passive  Shoulder adduction    Shoulder internal rotation 60 53  Shoulder external rotation 75 50  Elbow flexion    Elbow extension    Wrist flexion    Wrist extension    Wrist ulnar deviation    Wrist radial deviation    Wrist pronation    Wrist supination    (Blank rows = not tested)  UPPER EXTREMITY MMT: Unable to assess at eval due to significant pain/ROM limitations  MMT Right eval Left eval  Shoulder flexion    Shoulder extension    Shoulder abduction    Shoulder adduction    Shoulder internal rotation    Shoulder external rotation    Middle trapezius    Lower trapezius    Elbow flexion    Elbow extension    Wrist flexion    Wrist extension    Wrist ulnar deviation    Wrist radial deviation    Wrist pronation    Wrist supination    Grip strength (lbs)    (Blank rows = not tested)  SHOULDER SPECIAL TESTS: Rotator cuff assessment: Full  can test: positive    PALPATION:  Tender  on palpation of L anterior shoulder, L AC joint, Supraspinatus tendon                                                                                                                             TREATMENT DATE: 06/03/24 Manual:   L UT ischemic tp release x 3 min L supraspinatus ischemic TP release and STM x 3 min    TE- To improve strength, endurance, mobility, and function of specific targeted muscle groups or improve joint range of motion or improve muscle flexibility  PROM shoulder abduction, cues for relaxation x 15 min  L UT stretch with GH depression 2 x 45 sec   L supine isometric row  x 15 with pillow under arm   -x10 ea of IR and ER isometric in supine  Supine PVC press x 10   - Minimal pain noted changed patient's form and patient pain did improve but was a little sore following this activity  UE ranger x 10 ant, lat and CCW and CW   PATIENT EDUCATION: Education details: Pt educated on importance of maintaining movement in L shoulder for healing purposes and to prevent frozen shoulder. Pt educated on HEP and provided with full demonstration as well as cues to prevent pushing AROM passed point of severe pain Person educated: Patient Education method: Explanation, Demonstration, Verbal cues, and Handouts Education comprehension: verbalized understanding and returned demonstration  HOME EXERCISE PROGRAM: Access Code: [email or web address] URL: [email or web address] Date: 05/22/2024 Prepared by: Lonni Gainer  Exercises - Seated Shoulder Flexion Towel Slide at Table Top  - 2 x daily - 7 x weekly - 10 reps - 5 sec hold - Seated Shoulder Abduction Towel Slide at Table Top  - 2 x daily - 7 x weekly - 10 reps - 5 sec hold - Supine Isometric Shoulder Extension with Towel  - 2 x daily - 7 x weekly - 2 sets - 10 reps  ASSESSMENT:  CLINICAL IMPRESSION:  Patient presents with good motivation for completion of physical therapy  activities.  Patient continues with initial left shoulder physical therapy activities including passive range of motion to improve patient's pain-free motion as well as some active assisted range of motion and continuing initial strengthening activities this date.  Will continue to progress based on patient's symptoms and response to today's treatment session. Patient's condition has the potential to improve in response to therapy. Maximum improvement is yet to be obtained. The anticipated improvement is attainable and reasonable in a generally predictable time.     OBJECTIVE IMPAIRMENTS: decreased activity tolerance, decreased mobility, decreased ROM, decreased strength, hypomobility, impaired flexibility, impaired sensation, impaired UE functional use, improper body mechanics, postural dysfunction, and pain.   ACTIVITY LIMITATIONS: carrying, lifting, sleeping, transfers, bed mobility, bathing, toileting, dressing, self feeding, reach over head, hygiene/grooming, locomotion level, and caring for others  PARTICIPATION LIMITATIONS: meal prep, cleaning, laundry, driving, shopping, community activity, and yard work  PERSONAL FACTORS: Age, Profession, Time since onset of  injury/illness/exacerbation, and 3+ comorbidities: abdominal hernia, aortic ejection murmur, chronic hepatitis C, depression, DM, GERD, headache, hepatic cirrhosis, HTN, anemia, irregular heart rhythm, palpitations are also affecting patient's functional outcome.   REHAB POTENTIAL: Good  CLINICAL DECISION MAKING: Evolving/moderate complexity  EVALUATION COMPLEXITY: Moderate   GOALS: Goals reviewed with patient? Yes  SHORT TERM GOALS: Target date: 06/06/2024   Patient will be independent in home exercise program to improve strength/mobility for better functional independence with ADLs. Baseline: see HEP above Goal status: INITIAL   LONG TERM GOALS: Target date: 07/11/2024  Patient will decrease Quick DASH score by > 8 points  demonstrating reduced self-reported upper extremity disability. Baseline: 88.6 / 100 = 88.6 % Goal status: INITIAL  2.  Patient will improve L shoulder AROM to > 140 degrees of flexion and abduction for improved ability to perform overhead activities. Baseline: flexion- 65 active, 120 passive; abduction- 60 active, 100 passive Goal status: INITIAL  3.  Pt will report worst shoulder pain of 3/10 on NPS to demonstrated improvements in pain and to allow for improved UE function Baseline: 7.5/10 Goal status: INITIAL  4.  Pt will increase L UE strength to 4+/5 or better to   Baseline: assess at future session Goal status: INITIAL   PLAN:  PT FREQUENCY: 2x/week  PT DURATION: 8 weeks  PLANNED INTERVENTIONS: [id number]- PT Re-evaluation, 97750- Physical Performance Testing, 97110-Therapeutic exercises, 97530- Therapeutic activity, [id number]- Neuromuscular re-education, 97535- Self Care, [id number]- Manual therapy, [id number]- Orthotic Initial, [id number]- Orthotic/Prosthetic subsequent, [id number]- Canalith repositioning, [id number]- Splinting, [id number]- Electrical stimulation (manual), [id number]- Ultrasound, [id number]- Traction (mechanical), 20560 (1-2 muscles), 20561 (3+ muscles)- Dry Needling, Patient/Family education, Balance training, Stair training, Taping, Joint mobilization, Spinal mobilization, Vestibular training, DME instructions, Cryotherapy, and Moist heat  PLAN FOR NEXT SESSION: Assess HEP compliance, add to HEP as necessary, L shoulder AAROM, L shoulder manual therapy/joint mobilizations as tolerated for pain control, isometrics   Note: Portions of this document were prepared using Dragon voice recognition software and although reviewed may contain unintentional dictation errors in syntax, grammar, or spelling.  Lonni KATHEE Gainer PT ,DPT Physical Therapist- [facility]   06/03/2024, 2:53 PM

## 2024-06-05 ENCOUNTER — Ambulatory Visit

## 2024-06-05 DIAGNOSIS — M6281 Muscle weakness (generalized): Secondary | ICD-10-CM

## 2024-06-05 DIAGNOSIS — M25612 Stiffness of left shoulder, not elsewhere classified: Secondary | ICD-10-CM

## 2024-06-05 DIAGNOSIS — G8929 Other chronic pain: Secondary | ICD-10-CM

## 2024-06-05 NOTE — Therapy (Signed)
 OUTPATIENT PHYSICAL THERAPY SHOULDER TREATMENT   Patient Name: Roberta Lane MRN: [id number] DOB:[birth year], 68 y.o., female Today's Date: 06/05/2024  END OF SESSION:  PT End of Session - 06/05/24 1353     Visit Number 4    Number of Visits 16    Date for PT Re-Evaluation 07/11/24    Progress Note Due on Visit 10    PT Start Time 1400    PT Stop Time 1440    PT Time Calculation (min) 40 min    Activity Tolerance Patient tolerated treatment well;Patient limited by pain    Behavior During Therapy WFL for tasks assessed/performed            Past Medical History:  Diagnosis Date   Abdominal hernia    Allergic genetic state    Aortic ejection murmur    Ascites    Chronic hepatitis C (HCC)    Depression    Diabetes mellitus without complication (HCC)    Dysrhythmia    GERD (gastroesophageal reflux disease)    GIB (gastrointestinal bleeding)    Headache    Hepatic cirrhosis (HCC)    Hepatitis C    Hernia, abdominal    Hyperlipidemia    Hypertension    IDA (iron  deficiency anemia)    Inflamed seborrheic keratosis    Interdigital neuroma of foot    Irregular heart rhythm    Liver disease    Multiple renal cysts    Palpitations    Pancytopenia (HCC)    Portal hypertension with esophageal varices (HCC)    Splenomegaly    UTI (urinary tract infection)    Past Surgical History:  Procedure Laterality Date   ABDOMINAL HYSTERECTOMY     CHOLECYSTECTOMY     COLONOSCOPY WITH PROPOFOL  N/A 12/05/2023   Procedure: COLONOSCOPY WITH PROPOFOL ;  Surgeon: Toledo, Ladell POUR, MD;  Location: [facility];  Service: Gastroenterology;  Laterality: N/A;  DM   ESOPHAGOGASTRODUODENOSCOPY N/A 02/17/2021   Procedure: ESOPHAGOGASTRODUODENOSCOPY (EGD);  Surgeon: Toledo, Ladell POUR, MD;  Location: [facility];  Service: Gastroenterology;  Laterality: N/A;   ESOPHAGOGASTRODUODENOSCOPY (EGD) WITH PROPOFOL  N/A 11/23/2017   Procedure: ESOPHAGOGASTRODUODENOSCOPY (EGD) WITH PROPOFOL ;   Surgeon: Toledo, Ladell POUR, MD;  Location: [facility];  Service: Gastroenterology;  Laterality: N/A;   ESOPHAGOGASTRODUODENOSCOPY (EGD) WITH PROPOFOL  N/A 10/23/2018   Procedure: ESOPHAGOGASTRODUODENOSCOPY (EGD) WITH PROPOFOL ;  Surgeon: Toledo, Ladell POUR, MD;  Location: [facility];  Service: Gastroenterology;  Laterality: N/A;   ESOPHAGOGASTRODUODENOSCOPY (EGD) WITH PROPOFOL  N/A 10/10/2019   Procedure: ESOPHAGOGASTRODUODENOSCOPY (EGD) WITH PROPOFOL ;  Surgeon: Toledo, Ladell POUR, MD;  Location: [facility];  Service: Gastroenterology;  Laterality: N/A;   ESOPHAGOGASTRODUODENOSCOPY (EGD) WITH PROPOFOL  N/A 11/14/2019   Procedure: ESOPHAGOGASTRODUODENOSCOPY (EGD) WITH PROPOFOL ;  Surgeon: Toledo, Ladell POUR, MD;  Location: [facility];  Service: Gastroenterology;  Laterality: N/A;   ESOPHAGOGASTRODUODENOSCOPY (EGD) WITH PROPOFOL  N/A 12/05/2023   Procedure: ESOPHAGOGASTRODUODENOSCOPY (EGD) WITH PROPOFOL ;  Surgeon: Toledo, Ladell POUR, MD;  Location: [facility];  Service: Gastroenterology;  Laterality: N/A;   right elbow surgery     WISDOM TOOTH EXTRACTION     Patient Active Problem List   Diagnosis Date Noted   Renal mass, left 04/11/2020   Hypertension 03/18/2020   Hepatic cirrhosis due to chronic hepatitis C infection (HCC) 11/28/2019   Chronic pain of both knees 03/26/2019   Other ascites 03/26/2019   Aortic stenosis, mild 01/07/2019   Iron  deficiency anemia 12/31/2018   Hepatic encephalopathy (HCC) 07/20/2018   Frequent PVCs 06/13/2018   Portal hypertension with esophageal varices (HCC)  06/12/2018   Aortic ejection murmur 05/29/2018   Dizziness 05/29/2018   Hyperlipidemia, mixed 05/29/2018   Palpitations 05/29/2018   Controlled type 2 diabetes mellitus without complication, without long-term current use of insulin  (HCC) 05/17/2018   Abdominal pain, diffuse 12/14/2017   Gastroesophageal reflux disease without esophagitis 12/14/2017   Pancytopenia (HCC) 12/10/2017   GIB  (gastrointestinal bleeding) 11/21/2017    PCP: Lynwood Null, MD  REFERRING PROVIDER: Ozell Flake, MD  REFERRING DIAG: S46.012D (ICD-10-CM) - Strain of muscle(s) and tendon(s) of the rotator cuff of left shoulder, subsequent encounter   THERAPY DIAG:  Muscle weakness (generalized)  Chronic left shoulder pain  Decreased range of motion of shoulder, left  Rationale for Evaluation and Treatment: Rehabilitation  ONSET DATE: October 2024  SUBJECTIVE:                                                                                                                                                                                      SUBJECTIVE STATEMENT:   Pt reports no new complaints upon arrival.  Pt reports having some achy feeling in the the shoulder which she attributes to the weather.  Pt reports she was tired yesterday and did not do the HEP    PERTINENT HISTORY: Pt was seen by PCP at [facility] on 04/11/24 with c/c of L shoulder pain that has progressively worsened after a fall pt sustained in October of 2024. Pt states that she lifted her 35 pound suitcase onto the shuttle bus at the airport in [place] and she caught her foot on the step of the shuttle bus and pulled her suitcase and it fell backward onto her shoulder. PCP referred pt to orthopedic surgery, who saw pt on 04/17/24. Per orthopedic surgery note from 6/25: A suspected rotator cuff tear in the left shoulder followed a fall in October. Persistent pain radiating to the forearm and thumb suggests possible nerve involvement. X-ray shows mild AC degenerative changes with slight separation, but no calcific tendonitis or significant glenohumeral degenerative change. Physical examination reveals painful and weak shoulder movements, indicating a rotator cuff tear. MRI was ordered by orthopedic surgeon, with findings suggestive of mild to moderate insertional supraspinatus tendinopathy & probable small interstitial tear to  anterior insertion without a full-thickness tear.  PMH significant for abdominal hernia, aortic ejection murmur, chronic hepatitis C, depression, DM, GERD, headache, hepatic cirrhosis, HTN, anemia, irregular heart rhythm, palpitations  Pt states that the orthopedic surgeon wants her to do 4 weeks of PT and if the pain does not improve then they will do surgery.   PAIN:  Are you having pain? Yes: NPRS scale: 7.5/10 currently Pain location: Anterior shoulder,  into lateral deltoid Pain description: aching Aggravating factors: Reaching overhead, behind her, forward Relieving factors: Biofreeze helped initially  PRECAUTIONS: None  RED FLAGS: None   WEIGHT BEARING RESTRICTIONS: No  FALLS:  Has patient fallen in last 6 months? 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  DIAGNOSTIC FINDINGS:  MRI Shoulder Left WO Contrast (from 04/21/24):  FINDINGS: There is mild to moderate insertional tendinopathy of the supraspinatus tendon and a small interstitial tear to the anterior insertion. No retraction or full-thickness tear. Mild impingement change deep to the infraspinatus insertion. The subscapularis and teres minor tendons are unremarkable. There is mild-to-moderate tendinopathy of the intra-articular biceps tendon greater distally. Fibers are intact.   There is no labral  tear. Mild glenohumeral and acromioclavicular osteoarthritis with joint space narrowing and mild degenerative edema.   IMPRESSION: Mild to moderate insertional tendinopathy of the supraspinatus tendon and a probable small interstitial partial tear to the anterior insertion without retraction or full-thickness tear.   Mild-to-moderate tendinopathy of the intra-articular biceps tendon.   Mild glenohumeral and acromioclavicular osteoarthritis.  PATIENT SURVEYS:  QuickDASH: 88.6 / 100 = 88.6 %  COGNITION: Overall cognitive status: Within functional limits for tasks assessed     SENSATION: WFL Light touch: Pt reports that it feels duller on her L side around her shoulder  POSTURE: Slight forward head, rounded shoulders- pt holds L UE in guarded position against body at rest  CERVICAL ROM: all motions appeared WFL, pt has pain at end range extension and end range rotation to L  UPPER EXTREMITY ROM:    Active ROM Right eval Left eval  Shoulder flexion 150 65* active, 120 passive  Shoulder extension    Shoulder abduction 165 60* active, 100 passive  Shoulder adduction    Shoulder internal rotation 60 53  Shoulder external rotation 75 50  Elbow flexion    Elbow extension    Wrist flexion    Wrist extension    Wrist ulnar deviation    Wrist radial deviation    Wrist pronation    Wrist supination    (Blank rows = not tested)  UPPER EXTREMITY MMT: Unable to assess at eval due to significant pain/ROM limitations  MMT Right eval Left eval  Shoulder flexion    Shoulder extension    Shoulder abduction    Shoulder adduction    Shoulder internal rotation    Shoulder external rotation    Middle trapezius    Lower trapezius    Elbow flexion    Elbow extension    Wrist flexion    Wrist extension    Wrist ulnar deviation    Wrist radial deviation    Wrist pronation    Wrist supination    Grip strength (lbs)    (Blank rows = not tested)  SHOULDER SPECIAL  TESTS: Rotator cuff assessment: Full can test: positive  PALPATION:  Tender on palpation of L anterior shoulder, L AC joint, Supraspinatus tendon                                                                                                                             TREATMENT DATE: 06/05/24   Manual:   STM with TP release technique to L biceps, L UT, and L supraspinatus for increased tissue extensibility and pain modulation   PROM of L shoulder in all planes of mobility, cues for relaxation, slight overpressure applied at soft end ranges, 30 sec bouts    TE- To improve strength, endurance, mobility, and function of specific targeted muscle groups or improve joint range of motion or improve muscle flexibility  L UT stretch with GH depression 2x45 sec   L supine isometric row, 3 sec hold into the plinth, 2x10  Supine PVC chest press with 1.5# AW attached to dowel, 2x10   Supine shoulder flexion from thigh to ~110 deg, keeping arms straight, 2x10  Supine rhythmic stabilizations with shoulder flexed to 90 deg, 30 sec bouts with gentle pressure applied by therapist    PATIENT EDUCATION: Education details: Pt educated on importance of maintaining movement in L shoulder for healing purposes and to prevent frozen shoulder. Pt educated on HEP and provided with full demonstration as well as cues to prevent pushing AROM passed point of severe pain Person educated: Patient Education method: Explanation, Demonstration, Verbal cues, and Handouts Education comprehension: verbalized understanding and returned demonstration  HOME EXERCISE PROGRAM: Access Code: [email or web address] URL: [email or web address] Date: 05/22/2024 Prepared by: Lonni Gainer  Exercises - Seated Shoulder Flexion Towel Slide at Table Top  - 2 x daily - 7 x weekly - 10 reps - 5 sec hold - Seated Shoulder Abduction Towel Slide at Table Top  - 2 x daily - 7 x weekly - 10 reps - 5 sec hold - Supine  Isometric Shoulder Extension with Towel  - 2 x daily - 7 x weekly - 2 sets - 10 reps  ASSESSMENT:  CLINICAL IMPRESSION:   Pt responded well to the exercises without any increased in overall pain.  Pt was able to perform with increased resistance and no pain with the movement.  Pt was educated on performing within pain tolerable range  Pt is making good strides towards ROM as well and will continue to benefit from progressive strengthening exercise within pain tolerable range.   Pt will continue to benefit from skilled therapy to address remaining deficits in order to improve overall QoL and return to PLOF.       OBJECTIVE IMPAIRMENTS: decreased activity tolerance, decreased mobility, decreased ROM, decreased strength, hypomobility, impaired flexibility, impaired sensation, impaired UE functional use, improper body mechanics, postural dysfunction, and pain.   ACTIVITY LIMITATIONS: carrying, lifting, sleeping, transfers, bed mobility, bathing, toileting, dressing, self feeding, reach over head, hygiene/grooming, locomotion level, and caring for others  PARTICIPATION LIMITATIONS: meal prep, cleaning, laundry, driving, shopping, community activity, and yard  work  PERSONAL FACTORS: Age, Profession, Time since onset of injury/illness/exacerbation, and 3+ comorbidities: abdominal hernia, aortic ejection murmur, chronic hepatitis C, depression, DM, GERD, headache, hepatic cirrhosis, HTN, anemia, irregular heart rhythm, palpitations are also affecting patient's functional outcome.   REHAB POTENTIAL: Good  CLINICAL DECISION MAKING: Evolving/moderate complexity  EVALUATION COMPLEXITY: Moderate   GOALS: Goals reviewed with patient? Yes  SHORT TERM GOALS: Target date: 06/06/2024   Patient will be independent in home exercise program to improve strength/mobility for better functional independence with ADLs. Baseline: see HEP above Goal status: INITIAL   LONG TERM GOALS: Target date:  07/11/2024  Patient will decrease Quick DASH score by > 8 points demonstrating reduced self-reported upper extremity disability. Baseline: 88.6 / 100 = 88.6 % Goal status: INITIAL  2.  Patient will improve L shoulder AROM to > 140 degrees of flexion and abduction for improved ability to perform overhead activities. Baseline: flexion- 65 active, 120 passive; abduction- 60 active, 100 passive Goal status: INITIAL  3.  Pt will report worst shoulder pain of 3/10 on NPS to demonstrated improvements in pain and to allow for improved UE function Baseline: 7.5/10 Goal status: INITIAL  4.  Pt will increase L UE strength to 4+/5 or better to   Baseline: assess at future session Goal status: INITIAL   PLAN:  PT FREQUENCY: 2x/week  PT DURATION: 8 weeks  PLANNED INTERVENTIONS: [id number]- PT Re-evaluation, 97750- Physical Performance Testing, 97110-Therapeutic exercises, 97530- Therapeutic activity, [id number]- Neuromuscular re-education, 97535- Self Care, [id number]- Manual therapy, [id number]- Orthotic Initial, [id number]- Orthotic/Prosthetic subsequent, [phone]- Canalith repositioning, [id number]- Splinting, [id number]- Electrical stimulation (manual), [id number]- Ultrasound, [id number]- Traction (mechanical), 20560 (1-2 muscles), 20561 (3+ muscles)- Dry Needling, Patient/Family education, Balance training, Stair training, Taping, Joint mobilization, Spinal mobilization, Vestibular training, DME instructions, Cryotherapy, and Moist heat  PLAN FOR NEXT SESSION:   Assess HEP compliance, add to HEP as necessary, L shoulder AAROM, L shoulder manual therapy/joint mobilizations as tolerated for pain control, isometrics    Fonda LELON Simpers PT ,DPT Physical Therapist- [facility]  [facility]  06/05/2024, 5:36 PM

## 2024-06-11 ENCOUNTER — Ambulatory Visit

## 2024-06-11 DIAGNOSIS — G8929 Other chronic pain: Secondary | ICD-10-CM

## 2024-06-11 DIAGNOSIS — M6281 Muscle weakness (generalized): Secondary | ICD-10-CM | POA: Diagnosis not present

## 2024-06-11 DIAGNOSIS — M25612 Stiffness of left shoulder, not elsewhere classified: Secondary | ICD-10-CM

## 2024-06-11 NOTE — Therapy (Signed)
 OUTPATIENT PHYSICAL THERAPY SHOULDER TREATMENT   Patient Name: Roberta Lane MRN: [id number] DOB:[birth year], 68 y.o., female Today's Date: 06/11/2024  END OF SESSION:  PT End of Session - 06/11/24 1147     Visit Number 5    Number of Visits 16    Date for PT Re-Evaluation 07/11/24    Progress Note Due on Visit 10    PT Start Time 1147    PT Stop Time 1230    PT Time Calculation (min) 43 min    Activity Tolerance Patient tolerated treatment well;Patient limited by pain    Behavior During Therapy WFL for tasks assessed/performed            Past Medical History:  Diagnosis Date   Abdominal hernia    Allergic genetic state    Aortic ejection murmur    Ascites    Chronic hepatitis C (HCC)    Depression    Diabetes mellitus without complication (HCC)    Dysrhythmia    GERD (gastroesophageal reflux disease)    GIB (gastrointestinal bleeding)    Headache    Hepatic cirrhosis (HCC)    Hepatitis C    Hernia, abdominal    Hyperlipidemia    Hypertension    IDA (iron  deficiency anemia)    Inflamed seborrheic keratosis    Interdigital neuroma of foot    Irregular heart rhythm    Liver disease    Multiple renal cysts    Palpitations    Pancytopenia (HCC)    Portal hypertension with esophageal varices (HCC)    Splenomegaly    UTI (urinary tract infection)    Past Surgical History:  Procedure Laterality Date   ABDOMINAL HYSTERECTOMY     CHOLECYSTECTOMY     COLONOSCOPY WITH PROPOFOL  N/A 12/05/2023   Procedure: COLONOSCOPY WITH PROPOFOL ;  Surgeon: Toledo, Ladell POUR, MD;  Location: [facility];  Service: Gastroenterology;  Laterality: N/A;  DM   ESOPHAGOGASTRODUODENOSCOPY N/A 02/17/2021   Procedure: ESOPHAGOGASTRODUODENOSCOPY (EGD);  Surgeon: Toledo, Ladell POUR, MD;  Location: [facility];  Service: Gastroenterology;  Laterality: N/A;   ESOPHAGOGASTRODUODENOSCOPY (EGD) WITH PROPOFOL  N/A 11/23/2017   Procedure: ESOPHAGOGASTRODUODENOSCOPY (EGD) WITH PROPOFOL ;   Surgeon: Toledo, Ladell POUR, MD;  Location: [facility];  Service: Gastroenterology;  Laterality: N/A;   ESOPHAGOGASTRODUODENOSCOPY (EGD) WITH PROPOFOL  N/A 10/23/2018   Procedure: ESOPHAGOGASTRODUODENOSCOPY (EGD) WITH PROPOFOL ;  Surgeon: Toledo, Ladell POUR, MD;  Location: [facility];  Service: Gastroenterology;  Laterality: N/A;   ESOPHAGOGASTRODUODENOSCOPY (EGD) WITH PROPOFOL  N/A 10/10/2019   Procedure: ESOPHAGOGASTRODUODENOSCOPY (EGD) WITH PROPOFOL ;  Surgeon: Toledo, Ladell POUR, MD;  Location: [facility];  Service: Gastroenterology;  Laterality: N/A;   ESOPHAGOGASTRODUODENOSCOPY (EGD) WITH PROPOFOL  N/A 11/14/2019   Procedure: ESOPHAGOGASTRODUODENOSCOPY (EGD) WITH PROPOFOL ;  Surgeon: Toledo, Ladell POUR, MD;  Location: [facility];  Service: Gastroenterology;  Laterality: N/A;   ESOPHAGOGASTRODUODENOSCOPY (EGD) WITH PROPOFOL  N/A 12/05/2023   Procedure: ESOPHAGOGASTRODUODENOSCOPY (EGD) WITH PROPOFOL ;  Surgeon: Toledo, Ladell POUR, MD;  Location: [facility];  Service: Gastroenterology;  Laterality: N/A;   right elbow surgery     WISDOM TOOTH EXTRACTION     Patient Active Problem List   Diagnosis Date Noted   Renal mass, left 04/11/2020   Hypertension 03/18/2020   Hepatic cirrhosis due to chronic hepatitis C infection (HCC) 11/28/2019   Chronic pain of both knees 03/26/2019   Other ascites 03/26/2019   Aortic stenosis, mild 01/07/2019   Iron  deficiency anemia 12/31/2018   Hepatic encephalopathy (HCC) 07/20/2018   Frequent PVCs 06/13/2018   Portal hypertension with esophageal varices (HCC)  06/12/2018   Aortic ejection murmur 05/29/2018   Dizziness 05/29/2018   Hyperlipidemia, mixed 05/29/2018   Palpitations 05/29/2018   Controlled type 2 diabetes mellitus without complication, without long-term current use of insulin  (HCC) 05/17/2018   Abdominal pain, diffuse 12/14/2017   Gastroesophageal reflux disease without esophagitis 12/14/2017   Pancytopenia (HCC) 12/10/2017   GIB  (gastrointestinal bleeding) 11/21/2017    PCP: Lynwood Null, MD  REFERRING PROVIDER: Ozell Flake, MD  REFERRING DIAG: S46.012D (ICD-10-CM) - Strain of muscle(s) and tendon(s) of the rotator cuff of left shoulder, subsequent encounter   THERAPY DIAG:  Muscle weakness (generalized)  Chronic left shoulder pain  Decreased range of motion of shoulder, left  Rationale for Evaluation and Treatment: Rehabilitation  ONSET DATE: October 2024  SUBJECTIVE:                                                                                                                                                                                      SUBJECTIVE STATEMENT:  Pt reports her shoulder is 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  Pt otherwise notes that she is doing well.     PERTINENT HISTORY: Pt was seen by PCP at [facility] on 04/11/24 with c/c of L shoulder pain that has progressively worsened after a fall pt sustained in October of 2024. Pt states that she lifted her 35 pound suitcase onto the shuttle bus at the airport in [place] and she caught her foot on the step of the shuttle bus and pulled her suitcase and it fell backward onto her shoulder. PCP referred pt to orthopedic surgery, who saw pt on 04/17/24. Per orthopedic surgery note from 6/25: A suspected rotator cuff tear in the left shoulder followed a fall in October. Persistent pain radiating to the forearm and thumb suggests possible nerve involvement. X-ray shows mild AC degenerative changes with slight separation, but no calcific tendonitis or significant glenohumeral degenerative change. Physical examination reveals painful and weak shoulder movements, indicating a rotator cuff tear. MRI was ordered by orthopedic surgeon, with findings suggestive of mild to moderate insertional supraspinatus tendinopathy & probable small interstitial tear to anterior insertion without a full-thickness tear.  PMH  significant for abdominal hernia, aortic ejection murmur, chronic hepatitis C, depression, DM, GERD, headache, hepatic cirrhosis, HTN, anemia, irregular heart rhythm, palpitations  Pt states that the orthopedic surgeon wants her to do 4 weeks of PT and if the pain does not improve then they will do surgery.   PAIN:  Are you having pain? Yes: NPRS scale: 7.5/10 currently Pain location: Anterior shoulder, into lateral deltoid Pain description: aching Aggravating factors: Reaching overhead, behind her,  forward Relieving factors: Biofreeze helped initially  PRECAUTIONS: None  RED FLAGS: None   WEIGHT BEARING RESTRICTIONS: No  FALLS:  Has patient fallen in last 6 months? 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  DIAGNOSTIC FINDINGS:  MRI Shoulder Left WO Contrast (from 04/21/24):  FINDINGS: There is mild to moderate insertional tendinopathy of the supraspinatus tendon and a small interstitial tear to the anterior insertion. No retraction or full-thickness tear. Mild impingement change deep to the infraspinatus insertion. The subscapularis and teres minor tendons are unremarkable. There is mild-to-moderate tendinopathy of the intra-articular biceps tendon greater distally. Fibers are intact.   There is no labral tear. Mild glenohumeral and acromioclavicular  osteoarthritis with joint space narrowing and mild degenerative edema.   IMPRESSION: Mild to moderate insertional tendinopathy of the supraspinatus tendon and a probable small interstitial partial tear to the anterior insertion without retraction or full-thickness tear.   Mild-to-moderate tendinopathy of the intra-articular biceps tendon.   Mild glenohumeral and acromioclavicular osteoarthritis.  PATIENT SURVEYS:  QuickDASH: 88.6 / 100 = 88.6 %  COGNITION: Overall cognitive status: Within functional limits for tasks assessed     SENSATION: WFL Light touch: Pt reports that it feels duller on her L side around her shoulder  POSTURE: Slight forward head, rounded shoulders- pt holds L UE in guarded position against body at rest  CERVICAL ROM: all motions appeared WFL, pt has pain at end range extension and end range rotation to L  UPPER EXTREMITY ROM:    Active ROM Right eval Left eval  Shoulder flexion 150 65* active, 120 passive  Shoulder extension    Shoulder abduction 165 60* active, 100 passive  Shoulder adduction    Shoulder internal rotation 60 53  Shoulder external rotation 75 50  Elbow flexion    Elbow extension    Wrist flexion    Wrist extension    Wrist ulnar deviation    Wrist radial deviation    Wrist pronation    Wrist supination    (Blank rows = not tested)  UPPER EXTREMITY MMT: Unable to assess at eval due to significant pain/ROM limitations  MMT Right eval Left eval  Shoulder flexion    Shoulder extension    Shoulder abduction    Shoulder adduction    Shoulder internal rotation    Shoulder external rotation    Middle trapezius    Lower trapezius    Elbow flexion    Elbow extension    Wrist flexion    Wrist extension    Wrist ulnar deviation    Wrist radial deviation    Wrist pronation    Wrist supination    Grip strength (lbs)    (Blank rows = not tested)  SHOULDER SPECIAL TESTS: Rotator cuff assessment: Full can test: positive     PALPATION:  Tender on palpation of L anterior shoulder, L AC  joint, Supraspinatus tendon                                                                                                                             TREATMENT DATE: 06/11/24   Manual:   STM with TP release technique to L biceps, L UT, and L supraspinatus for increased tissue extensibility and pain modulation   PROM of L shoulder in all planes of mobility, cues for relaxation, slight overpressure applied at soft end ranges, 30 sec bouts  Supine STM to cervical region to increase extensibility of the paraspinals Supine suboccipital release technique to decrease cervicalgia Supine manual traction performed in order to increase joint space in cervical region for pain relief Supine cervical upglides/downglides, 30 sec bouts Supine UT/Levator stretch, 30 sec bouts to increase tissue extensibility of the cervical region  Supine subscapularis TP release with arm abducted, pt reporting significant soreness with the movement of the shoulder and the area under the scapula   PATIENT EDUCATION: Education details: Pt educated on importance of maintaining movement in L shoulder for healing purposes and to prevent frozen shoulder. Pt educated on HEP and provided with full demonstration as well as cues to prevent pushing AROM passed point of severe pain Person educated: Patient Education method: Explanation, Demonstration, Verbal cues, and Handouts Education comprehension: verbalized understanding and returned demonstration  HOME EXERCISE PROGRAM: Access Code: [email or web address] URL: [email or web address] Date: 05/22/2024 Prepared by: Lonni Gainer  Exercises - Seated Shoulder Flexion Towel Slide at Table Top  - 2 x daily - 7 x weekly - 10 reps - 5 sec hold - Seated Shoulder Abduction Towel Slide at Table Top  - 2 x daily - 7 x weekly - 10 reps - 5 sec hold - Supine Isometric Shoulder Extension with Towel  - 2 x daily -  7 x weekly - 2 sets - 10 reps  ASSESSMENT:  CLINICAL IMPRESSION:   Pt responded well to the manual therapy approach today.  Therapist elected to hold on strengthening exercises due to the amount of soreness that the pt was experiencing just from driving the rental car.  Extensive amount of time spent on releasing trigger points in the UT's and L shoulder musculature.  Pt encouraged to continue with HEP and will benefit from improved strengthening exercises at the next visit.   Pt will continue to benefit from skilled therapy to address remaining deficits in order to improve overall QoL and return to PLOF.        OBJECTIVE IMPAIRMENTS: decreased activity tolerance, decreased mobility, decreased ROM, decreased strength, hypomobility, impaired flexibility, impaired sensation, impaired UE functional use, improper body mechanics, postural dysfunction, and pain.   ACTIVITY LIMITATIONS: carrying, lifting, sleeping, transfers, bed mobility, bathing, toileting, dressing, self feeding, reach over head, hygiene/grooming, locomotion level, and caring for others  PARTICIPATION LIMITATIONS: meal prep, cleaning, laundry, driving, shopping, community activity, and yard work  PERSONAL FACTORS: Age, Profession, Time since onset of injury/illness/exacerbation, and 3+ comorbidities: abdominal hernia,  aortic ejection murmur, chronic hepatitis C, depression, DM, GERD, headache, hepatic cirrhosis, HTN, anemia, irregular heart rhythm, palpitations are also affecting patient's functional outcome.   REHAB POTENTIAL: Good  CLINICAL DECISION MAKING: Evolving/moderate complexity  EVALUATION COMPLEXITY: Moderate   GOALS: Goals reviewed with patient? Yes  SHORT TERM GOALS: Target date: 06/06/2024   Patient will be independent in home exercise program to improve strength/mobility for better functional independence with ADLs. Baseline: see HEP above Goal status: INITIAL   LONG TERM GOALS: Target date:  07/11/2024  Patient will decrease Quick DASH score by > 8 points demonstrating reduced self-reported upper extremity disability. Baseline: 88.6 / 100 = 88.6 % Goal status: INITIAL  2.  Patient will improve L shoulder AROM to > 140 degrees of flexion and abduction for improved ability to perform overhead activities. Baseline: flexion- 65 active, 120 passive; abduction- 60 active, 100 passive Goal status: INITIAL  3.  Pt will report worst shoulder pain of 3/10 on NPS to demonstrated improvements in pain and to allow for improved UE function Baseline: 7.5/10 Goal status: INITIAL  4.  Pt will increase L UE strength to 4+/5 or better to   Baseline: assess at future session Goal status: INITIAL   PLAN:  PT FREQUENCY: 2x/week  PT DURATION: 8 weeks  PLANNED INTERVENTIONS: [id number]- PT Re-evaluation, 97750- Physical Performance Testing, 97110-Therapeutic exercises, 97530- Therapeutic activity, [id number]- Neuromuscular re-education, 97535- Self Care, [id number]- Manual therapy, [id number]- Orthotic Initial, [id number]- Orthotic/Prosthetic subsequent, [phone]- Canalith repositioning, [id number]- Splinting, [id number]- Electrical stimulation (manual), [id number]- Ultrasound, [id number]- Traction (mechanical), 20560 (1-2 muscles), 20561 (3+ muscles)- Dry Needling, Patient/Family education, Balance training, Stair training, Taping, Joint mobilization, Spinal mobilization, Vestibular training, DME instructions, Cryotherapy, and Moist heat  PLAN FOR NEXT SESSION:   Progress HEP L shoulder AAROM L shoulder manual therapy/joint mobilizations as tolerated/needed for pain control, isometrics    Fonda LELON Simpers PT ,DPT Physical Therapist-   [facility]  06/11/2024, 1:54 PM

## 2024-06-14 ENCOUNTER — Ambulatory Visit: Admitting: Physical Therapy

## 2024-06-14 DIAGNOSIS — G8929 Other chronic pain: Secondary | ICD-10-CM

## 2024-06-14 DIAGNOSIS — M6281 Muscle weakness (generalized): Secondary | ICD-10-CM | POA: Diagnosis not present

## 2024-06-14 DIAGNOSIS — M25612 Stiffness of left shoulder, not elsewhere classified: Secondary | ICD-10-CM

## 2024-06-14 NOTE — Therapy (Signed)
 OUTPATIENT PHYSICAL THERAPY SHOULDER TREATMENT   Patient Name: Roberta Lane MRN: [id number] DOB:09-01-1956, 68 y.o., female Today's Date: 06/14/2024  END OF SESSION:  PT End of Session - 06/14/24 0806     Visit Number 6    Number of Visits 16    Date for PT Re-Evaluation 07/11/24    Progress Note Due on Visit 10    PT Start Time 0804    PT Stop Time 0842    PT Time Calculation (min) 38 min    Activity Tolerance Patient tolerated treatment well;Patient limited by pain    Behavior During Therapy [facility] for tasks assessed/performed           Past Medical History:  Diagnosis Date   Abdominal hernia    Allergic genetic state    Aortic ejection murmur    Ascites    Chronic hepatitis C (HCC)    Depression    Diabetes mellitus without complication (HCC)    Dysrhythmia    GERD (gastroesophageal reflux disease)    GIB (gastrointestinal bleeding)    Headache    Hepatic cirrhosis (HCC)    Hepatitis C    Hernia, abdominal    Hyperlipidemia    Hypertension    IDA (iron  deficiency anemia)    Inflamed seborrheic keratosis    Interdigital neuroma of foot    Irregular heart rhythm    Liver disease    Multiple renal cysts    Palpitations    Pancytopenia (HCC)    Portal hypertension with esophageal varices (HCC)    Splenomegaly    UTI (urinary tract infection)    Past Surgical History:  Procedure Laterality Date   ABDOMINAL HYSTERECTOMY     CHOLECYSTECTOMY     COLONOSCOPY WITH PROPOFOL  N/A 12/05/2023   Procedure: COLONOSCOPY WITH PROPOFOL ;  Surgeon: Toledo, Ladell POUR, MD;  Location: [facility];  Service: Gastroenterology;  Laterality: N/A;  DM   ESOPHAGOGASTRODUODENOSCOPY N/A 02/17/2021   Procedure: ESOPHAGOGASTRODUODENOSCOPY (EGD);  Surgeon: Toledo, Ladell POUR, MD;  Location: [facility];  Service: Gastroenterology;  Laterality: N/A;   ESOPHAGOGASTRODUODENOSCOPY (EGD) WITH PROPOFOL  N/A 11/23/2017   Procedure: ESOPHAGOGASTRODUODENOSCOPY (EGD) WITH PROPOFOL ;  Surgeon:  Toledo, Ladell POUR, MD;  Location: [facility];  Service: Gastroenterology;  Laterality: N/A;   ESOPHAGOGASTRODUODENOSCOPY (EGD) WITH PROPOFOL  N/A 10/23/2018   Procedure: ESOPHAGOGASTRODUODENOSCOPY (EGD) WITH PROPOFOL ;  Surgeon: Toledo, Ladell POUR, MD;  Location: [facility];  Service: Gastroenterology;  Laterality: N/A;   ESOPHAGOGASTRODUODENOSCOPY (EGD) WITH PROPOFOL  N/A 10/10/2019   Procedure: ESOPHAGOGASTRODUODENOSCOPY (EGD) WITH PROPOFOL ;  Surgeon: Toledo, Ladell POUR, MD;  Location: [facility];  Service: Gastroenterology;  Laterality: N/A;   ESOPHAGOGASTRODUODENOSCOPY (EGD) WITH PROPOFOL  N/A 11/14/2019   Procedure: ESOPHAGOGASTRODUODENOSCOPY (EGD) WITH PROPOFOL ;  Surgeon: Toledo, Ladell POUR, MD;  Location: [facility];  Service: Gastroenterology;  Laterality: N/A;   ESOPHAGOGASTRODUODENOSCOPY (EGD) WITH PROPOFOL  N/A 12/05/2023   Procedure: ESOPHAGOGASTRODUODENOSCOPY (EGD) WITH PROPOFOL ;  Surgeon: Toledo, Ladell POUR, MD;  Location: [facility];  Service: Gastroenterology;  Laterality: N/A;   right elbow surgery     WISDOM TOOTH EXTRACTION     Patient Active Problem List   Diagnosis Date Noted   Renal mass, left 04/11/2020   Hypertension 03/18/2020   Hepatic cirrhosis due to chronic hepatitis C infection (HCC) 11/28/2019   Chronic pain of both knees 03/26/2019   Other ascites 03/26/2019   Aortic stenosis, mild 01/07/2019   Iron  deficiency anemia 12/31/2018   Hepatic encephalopathy (HCC) 07/20/2018   Frequent PVCs 06/13/2018   Portal hypertension with esophageal varices (HCC) 06/12/2018  Aortic ejection murmur 05/29/2018   Dizziness 05/29/2018   Hyperlipidemia, mixed 05/29/2018   Palpitations 05/29/2018   Controlled type 2 diabetes mellitus without complication, without long-term current use of insulin  (HCC) 05/17/2018   Abdominal pain, diffuse 12/14/2017   Gastroesophageal reflux disease without esophagitis 12/14/2017   Pancytopenia (HCC) 12/10/2017   GIB  (gastrointestinal bleeding) 11/21/2017    PCP: Lynwood Null, MD  REFERRING PROVIDER: Ozell Flake, MD  REFERRING DIAG: S46.012D (ICD-10-CM) - Strain of muscle(s) and tendon(s) of the rotator cuff of left shoulder, subsequent encounter   THERAPY DIAG:  Muscle weakness (generalized)  Chronic left shoulder pain  Decreased range of motion of shoulder, left  Rationale for Evaluation and Treatment: Rehabilitation  ONSET DATE: October 2024  SUBJECTIVE:                                                                                                                                                                                      SUBJECTIVE STATEMENT:   Pt reports her shoulder is 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  Pt otherwise notes that she is doing well.     PERTINENT HISTORY: Pt was seen by PCP at [facility] on 04/11/24 with c/c of L shoulder pain that has progressively worsened after a fall pt sustained in October of 2024. Pt states that she lifted her 35 pound suitcase onto the shuttle bus at the airport in [place] and she caught her foot on the step of the shuttle bus and pulled her suitcase and it fell backward onto her shoulder. PCP referred pt to orthopedic surgery, who saw pt on 04/17/24. Per orthopedic surgery note from 6/25: A suspected rotator cuff tear in the left shoulder followed a fall in October. Persistent pain radiating to the forearm and thumb suggests possible nerve involvement. X-ray shows mild AC degenerative changes with slight separation, but no calcific tendonitis or significant glenohumeral degenerative change. Physical examination reveals painful and weak shoulder movements, indicating a rotator cuff tear. MRI was ordered by orthopedic surgeon, with findings suggestive of mild to moderate insertional supraspinatus tendinopathy & probable small interstitial tear to anterior insertion without a full-thickness tear.  PMH  significant for abdominal hernia, aortic ejection murmur, chronic hepatitis C, depression, DM, GERD, headache, hepatic cirrhosis, HTN, anemia, irregular heart rhythm, palpitations  Pt states that the orthopedic surgeon wants her to do 4 weeks of PT and if the pain does not improve then they will do surgery.   PAIN:  Are you having pain? Yes: NPRS scale: 7.5/10 currently Pain location: Anterior shoulder, into lateral deltoid Pain description: aching Aggravating factors: Reaching overhead, behind her, forward Relieving  factors: Biofreeze helped initially  PRECAUTIONS: None  RED FLAGS: None   WEIGHT BEARING RESTRICTIONS: No  FALLS:  Has patient fallen in last 6 months? 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  DIAGNOSTIC FINDINGS:  MRI Shoulder Left WO Contrast (from 04/21/24):  FINDINGS: There is mild to moderate insertional tendinopathy of the supraspinatus tendon and a small interstitial tear to the anterior insertion. No retraction or full-thickness tear. Mild impingement change deep to the infraspinatus insertion. The subscapularis and teres minor tendons are unremarkable. There is mild-to-moderate tendinopathy of the intra-articular biceps tendon greater distally. Fibers are intact.   There is no labral tear. Mild glenohumeral and acromioclavicular  osteoarthritis with joint space narrowing and mild degenerative edema.   IMPRESSION: Mild to moderate insertional tendinopathy of the supraspinatus tendon and a probable small interstitial partial tear to the anterior insertion without retraction or full-thickness tear.   Mild-to-moderate tendinopathy of the intra-articular biceps tendon.   Mild glenohumeral and acromioclavicular osteoarthritis.  PATIENT SURVEYS:  QuickDASH: 88.6 / 100 = 88.6 %  COGNITION: Overall cognitive status: Within functional limits for tasks assessed     SENSATION: WFL Light touch: Pt reports that it feels duller on her L side around her shoulder  POSTURE: Slight forward head, rounded shoulders- pt holds L UE in guarded position against body at rest  CERVICAL ROM: all motions appeared WFL, pt has pain at end range extension and end range rotation to L  UPPER EXTREMITY ROM:    Active ROM Right eval Left eval  Shoulder flexion 150 65* active, 120 passive  Shoulder extension    Shoulder abduction 165 60* active, 100 passive  Shoulder adduction    Shoulder internal rotation 60 53  Shoulder external rotation 75 50  Elbow flexion    Elbow extension    Wrist flexion    Wrist extension    Wrist ulnar deviation    Wrist radial deviation    Wrist pronation    Wrist supination    (Blank rows = not tested)  UPPER EXTREMITY MMT: Unable to assess at eval due to significant pain/ROM limitations  MMT Right eval Left eval  Shoulder flexion    Shoulder extension    Shoulder abduction    Shoulder adduction    Shoulder internal rotation    Shoulder external rotation    Middle trapezius    Lower trapezius    Elbow flexion    Elbow extension    Wrist flexion    Wrist extension    Wrist ulnar deviation    Wrist radial deviation    Wrist pronation    Wrist supination    Grip strength (lbs)    (Blank rows = not tested)  SHOULDER SPECIAL TESTS: Rotator cuff assessment: Full can test: positive     PALPATION:  Tender on palpation of L anterior shoulder, L AC joint, Supraspinatus  tendon                                                                                                                             TREATMENT DATE: 06/14/24   Manual:     STM with TP release technique to L biceps, L UT, and L supraspinatus for increased tissue extensibility and pain modulation    TE- To improve strength, endurance, mobility, and function of specific targeted muscle groups or improve joint range of motion or improve muscle flexibility  PROM of L shoulder in all planes of mobility, cues for relaxation, slight overpressure applied at soft end ranges, 30 sec bouts  L UT stretch with GH depression 2 x 45 sec   Chest press with 3# - pain at 7 reps stopped and decreased resistance to PVC pipe 2 x 10 reps - towel under L arm   L supine isometric row, 3 sec hold into the plinth, 2x10  - initial discomfort in neck but instructed pt to put in about 50% effert to prevent unintended muscle recruitment and this eliminated pain  Supine iso ER x 10 with 50% effort   Supine iso IR with 50 % effort x 10   Seated scap retraction 2 x 10   Self Care: instruction in proper issue healing and difference between no pain no gain and how this is not applicable to orthopaedic injuries as pain typically indicates tissue aggravation.   PATIENT EDUCATION: Education details: Pt educated on importance of maintaining movement in L shoulder for healing purposes and to prevent frozen shoulder. Pt educated on HEP and provided with full demonstration as well as cues to prevent pushing AROM passed point of severe pain Person educated: Patient Education method: Explanation, Demonstration, Verbal cues, and Handouts Education comprehension: verbalized understanding and returned demonstration  HOME EXERCISE PROGRAM: Access Code: [email or web address] URL: [email or web address] Date: 05/22/2024 Prepared by:  Lonni Gainer  Exercises - Seated Shoulder Flexion Towel Slide at Table Top  - 2 x daily - 7 x weekly - 10 reps - 5 sec hold - Seated Shoulder Abduction Towel Slide at Table Top  - 2 x daily - 7 x weekly - 10 reps - 5 sec hold - Supine Isometric Shoulder Extension with Towel  - 2 x daily - 7 x weekly - 2 sets - 10 reps  ASSESSMENT:  CLINICAL IMPRESSION:   Continued with current plan of care as laid out in evaluation and recent prior sessions. Pt remains motivated to advance progress toward goals in order to maximize independence and safety at home. Pt pain improved from last session but still more aggravated compared to last session with this PT.  Instructed pt in importance of not pushing through acute and injury provoking pain.  Pt will continue to benefit from skilled physical therapy intervention to address impairments, improve QOL, and attain therapy goals.      OBJECTIVE IMPAIRMENTS: decreased activity tolerance, decreased mobility, decreased ROM, decreased strength, hypomobility, impaired  flexibility, impaired sensation, impaired UE functional use, improper body mechanics, postural dysfunction, and pain.   ACTIVITY LIMITATIONS: carrying, lifting, sleeping, transfers, bed mobility, bathing, toileting, dressing, self feeding, reach over head, hygiene/grooming, locomotion level, and caring for others  PARTICIPATION LIMITATIONS: meal prep, cleaning, laundry, driving, shopping, community activity, and yard work  PERSONAL FACTORS: Age, Profession, Time since onset of injury/illness/exacerbation, and 3+ comorbidities: abdominal hernia, aortic ejection murmur, chronic hepatitis C, depression, DM, GERD, headache, hepatic cirrhosis, HTN, anemia, irregular heart rhythm, palpitations are also affecting patient's functional outcome.   REHAB POTENTIAL: Good  CLINICAL DECISION MAKING: Evolving/moderate complexity  EVALUATION COMPLEXITY: Moderate   GOALS: Goals reviewed with patient?  Yes  SHORT TERM GOALS: Target date: 06/06/2024   Patient will be independent in home exercise program to improve strength/mobility for better functional independence with ADLs. Baseline: see HEP above Goal status: INITIAL   LONG TERM GOALS: Target date: 07/11/2024  Patient will decrease Quick DASH score by > 8 points demonstrating reduced self-reported upper extremity disability. Baseline: 88.6 / 100 = 88.6 % Goal status: INITIAL  2.  Patient will improve L shoulder AROM to > 140 degrees of flexion and abduction for improved ability to perform overhead activities. Baseline: flexion- 65 active, 120 passive; abduction- 60 active, 100 passive Goal status: INITIAL  3.  Pt will report worst shoulder pain of 3/10 on NPS to demonstrated improvements in pain and to allow for improved UE function Baseline: 7.5/10 Goal status: INITIAL  4.  Pt will increase L UE strength to 4+/5 or better to   Baseline: assess at future session Goal status: INITIAL   PLAN:  PT FREQUENCY: 2x/week  PT DURATION: 8 weeks  PLANNED INTERVENTIONS: [id number]- PT Re-evaluation, 97750- Physical Performance Testing, 97110-Therapeutic exercises, 97530- Therapeutic activity, [id number]- Neuromuscular re-education, 97535- Self Care, [id number]- Manual therapy, [id number]- Orthotic Initial, [id number]- Orthotic/Prosthetic subsequent, [id number]- Canalith repositioning, [id number]- Splinting, [id number]- Electrical stimulation (manual), [id number]- Ultrasound, [id number]- Traction (mechanical), 20560 (1-2 muscles), 20561 (3+ muscles)- Dry Needling, Patient/Family education, Balance training, Stair training, Taping, Joint mobilization, Spinal mobilization, Vestibular training, DME instructions, Cryotherapy, and Moist heat  PLAN FOR NEXT SESSION:   Progress HEP L shoulder AAROM L shoulder manual therapy/joint mobilizations as tolerated/needed for pain control, isometrics    Lonni KATHEE Gainer PT ,DPT Physical Therapist- [place]  [facility]   06/14/2024, 8:09 AM

## 2024-06-18 ENCOUNTER — Ambulatory Visit

## 2024-06-18 DIAGNOSIS — M25512 Pain in left shoulder: Secondary | ICD-10-CM

## 2024-06-18 DIAGNOSIS — M6281 Muscle weakness (generalized): Secondary | ICD-10-CM | POA: Diagnosis not present

## 2024-06-18 DIAGNOSIS — M25612 Stiffness of left shoulder, not elsewhere classified: Secondary | ICD-10-CM

## 2024-06-18 NOTE — Therapy (Signed)
 OUTPATIENT PHYSICAL THERAPY SHOULDER TREATMENT   Patient Name: Roberta Lane MRN: [id number] DOB:1956-09-26, 68 y.o., female Today's Date: 06/18/2024  END OF SESSION:   PT End of Session - 06/18/24 1155     Visit Number 7    Number of Visits 16    Date for PT Re-Evaluation 07/11/24    Progress Note Due on Visit 10    PT Start Time 1150    PT Stop Time 1230    PT Time Calculation (min) 40 min    Activity Tolerance Patient tolerated treatment well;Patient limited by pain    Behavior During Therapy WFL for tasks assessed/performed           Past Medical History:  Diagnosis Date   Abdominal hernia    Allergic genetic state    Aortic ejection murmur    Ascites    Chronic hepatitis C (HCC)    Depression    Diabetes mellitus without complication (HCC)    Dysrhythmia    GERD (gastroesophageal reflux disease)    GIB (gastrointestinal bleeding)    Headache    Hepatic cirrhosis (HCC)    Hepatitis C    Hernia, abdominal    Hyperlipidemia    Hypertension    IDA (iron  deficiency anemia)    Inflamed seborrheic keratosis    Interdigital neuroma of foot    Irregular heart rhythm    Liver disease    Multiple renal cysts    Palpitations    Pancytopenia (HCC)    Portal hypertension with esophageal varices (HCC)    Splenomegaly    UTI (urinary tract infection)    Past Surgical History:  Procedure Laterality Date   ABDOMINAL HYSTERECTOMY     CHOLECYSTECTOMY     COLONOSCOPY WITH PROPOFOL  N/A 12/05/2023   Procedure: COLONOSCOPY WITH PROPOFOL ;  Surgeon: Toledo, Ladell POUR, MD;  Location: [facility];  Service: Gastroenterology;  Laterality: N/A;  DM   ESOPHAGOGASTRODUODENOSCOPY N/A 02/17/2021   Procedure: ESOPHAGOGASTRODUODENOSCOPY (EGD);  Surgeon: Toledo, Ladell POUR, MD;  Location: [facility];  Service: Gastroenterology;  Laterality: N/A;   ESOPHAGOGASTRODUODENOSCOPY (EGD) WITH PROPOFOL  N/A 11/23/2017   Procedure: ESOPHAGOGASTRODUODENOSCOPY (EGD) WITH PROPOFOL ;   Surgeon: Toledo, Ladell POUR, MD;  Location: [facility];  Service: Gastroenterology;  Laterality: N/A;   ESOPHAGOGASTRODUODENOSCOPY (EGD) WITH PROPOFOL  N/A 10/23/2018   Procedure: ESOPHAGOGASTRODUODENOSCOPY (EGD) WITH PROPOFOL ;  Surgeon: Toledo, Ladell POUR, MD;  Location: [facility];  Service: Gastroenterology;  Laterality: N/A;   ESOPHAGOGASTRODUODENOSCOPY (EGD) WITH PROPOFOL  N/A 10/10/2019   Procedure: ESOPHAGOGASTRODUODENOSCOPY (EGD) WITH PROPOFOL ;  Surgeon: Toledo, Ladell POUR, MD;  Location: [facility];  Service: Gastroenterology;  Laterality: N/A;   ESOPHAGOGASTRODUODENOSCOPY (EGD) WITH PROPOFOL  N/A 11/14/2019   Procedure: ESOPHAGOGASTRODUODENOSCOPY (EGD) WITH PROPOFOL ;  Surgeon: Toledo, Ladell POUR, MD;  Location: [facility];  Service: Gastroenterology;  Laterality: N/A;   ESOPHAGOGASTRODUODENOSCOPY (EGD) WITH PROPOFOL  N/A 12/05/2023   Procedure: ESOPHAGOGASTRODUODENOSCOPY (EGD) WITH PROPOFOL ;  Surgeon: Toledo, Ladell POUR, MD;  Location: [facility];  Service: Gastroenterology;  Laterality: N/A;   right elbow surgery     WISDOM TOOTH EXTRACTION     Patient Active Problem List   Diagnosis Date Noted   Renal mass, left 04/11/2020   Hypertension 03/18/2020   Hepatic cirrhosis due to chronic hepatitis C infection (HCC) 11/28/2019   Chronic pain of both knees 03/26/2019   Other ascites 03/26/2019   Aortic stenosis, mild 01/07/2019   Iron  deficiency anemia 12/31/2018   Hepatic encephalopathy (HCC) 07/20/2018   Frequent PVCs 06/13/2018   Portal hypertension with esophageal varices (HCC)  06/12/2018   Aortic ejection murmur 05/29/2018   Dizziness 05/29/2018   Hyperlipidemia, mixed 05/29/2018   Palpitations 05/29/2018   Controlled type 2 diabetes mellitus without complication, without long-term current use of insulin  (HCC) 05/17/2018   Abdominal pain, diffuse 12/14/2017   Gastroesophageal reflux disease without esophagitis 12/14/2017   Pancytopenia (HCC) 12/10/2017   GIB  (gastrointestinal bleeding) 11/21/2017    PCP: Lynwood Null, MD  REFERRING PROVIDER: Ozell Flake, MD  REFERRING DIAG: S46.012D (ICD-10-CM) - Strain of muscle(s) and tendon(s) of the rotator cuff of left shoulder, subsequent encounter   THERAPY DIAG:  Muscle weakness (generalized)  Chronic left shoulder pain  Decreased range of motion of shoulder, left  Rationale for Evaluation and Treatment: Rehabilitation  ONSET DATE: October 2024  SUBJECTIVE:                                                                                                                                                                                      SUBJECTIVE STATEMENT:   Pt reports no issues since the last visit.  Pt notes 7/10 pain.     PERTINENT HISTORY: Pt was seen by PCP at [facility] on 04/11/24 with c/c of L shoulder pain that has progressively worsened after a fall pt sustained in October of 2024. Pt states that she lifted her 35 pound suitcase onto the shuttle bus at the airport in [place] and she caught her foot on the step of the shuttle bus and pulled her suitcase and it fell backward onto her shoulder. PCP referred pt to orthopedic surgery, who saw pt on 04/17/24. Per orthopedic surgery note from 6/25: A suspected rotator cuff tear in the left shoulder followed a fall in October. Persistent pain radiating to the forearm and thumb suggests possible nerve involvement. X-ray shows mild AC degenerative changes with slight separation, but no calcific tendonitis or significant glenohumeral degenerative change. Physical examination reveals painful and weak shoulder movements, indicating a rotator cuff tear. MRI was ordered by orthopedic surgeon, with findings suggestive of mild to moderate insertional supraspinatus tendinopathy & probable small interstitial tear to anterior insertion without a full-thickness tear.  PMH significant for abdominal hernia, aortic ejection murmur, chronic hepatitis  C, depression, DM, GERD, headache, hepatic cirrhosis, HTN, anemia, irregular heart rhythm, palpitations  Pt states that the orthopedic surgeon wants her to do 4 weeks of PT and if the pain does not improve then they will do surgery.   PAIN:  Are you having pain? Yes: NPRS scale: 7/10 currently Pain location: Anterior shoulder, into lateral deltoid Pain description: aching Aggravating factors: Reaching overhead, behind her, forward Relieving factors: Biofreeze helped initially  PRECAUTIONS: None  RED  FLAGS: None   WEIGHT BEARING RESTRICTIONS: No  FALLS:  Has patient fallen in last 6 months? 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  DIAGNOSTIC FINDINGS:  MRI Shoulder Left WO Contrast (from 04/21/24):  FINDINGS: There is mild to moderate insertional tendinopathy of the supraspinatus tendon and a small interstitial tear to the anterior insertion. No retraction or full-thickness tear. Mild impingement change deep to the infraspinatus insertion. The subscapularis and teres minor tendons are unremarkable. There is mild-to-moderate tendinopathy of the intra-articular biceps tendon greater distally. Fibers are intact.   There is no labral tear. Mild glenohumeral and acromioclavicular osteoarthritis with joint space narrowing and mild degenerative edema.    IMPRESSION: Mild to moderate insertional tendinopathy of the supraspinatus tendon and a probable small interstitial partial tear to the anterior insertion without retraction or full-thickness tear.   Mild-to-moderate tendinopathy of the intra-articular biceps tendon.   Mild glenohumeral and acromioclavicular osteoarthritis.  PATIENT SURVEYS:  QuickDASH: 88.6 / 100 = 88.6 %  COGNITION: Overall cognitive status: Within functional limits for tasks assessed     SENSATION: WFL Light touch: Pt reports that it feels duller on her L side around her shoulder  POSTURE: Slight forward head, rounded shoulders- pt holds L UE in guarded position against body at rest  CERVICAL ROM: all motions appeared WFL, pt has pain at end range extension and end range rotation to L  UPPER EXTREMITY ROM:    Active ROM Right eval Left eval  Shoulder flexion 150 65* active, 120 passive  Shoulder extension    Shoulder abduction 165 60* active, 100 passive  Shoulder adduction    Shoulder internal rotation 60 53  Shoulder external rotation 75 50  Elbow flexion    Elbow extension    Wrist flexion    Wrist extension    Wrist ulnar deviation    Wrist radial deviation    Wrist pronation    Wrist supination    (Blank rows = not tested)  UPPER EXTREMITY MMT: Unable to assess at eval due to significant pain/ROM limitations  MMT Right eval Left eval  Shoulder flexion    Shoulder extension    Shoulder abduction    Shoulder adduction    Shoulder internal rotation    Shoulder external rotation    Middle trapezius    Lower trapezius    Elbow flexion    Elbow extension    Wrist flexion    Wrist extension    Wrist ulnar deviation    Wrist radial deviation    Wrist pronation    Wrist supination    Grip strength (lbs)    (Blank rows = not tested)  SHOULDER SPECIAL TESTS: Rotator cuff assessment: Full can test: positive    PALPATION:  Tender on palpation of L anterior shoulder, L AC joint,  Supraspinatus tendon  TREATMENT DATE: 06/18/24   Manual:   STM with TP release technique to L biceps, L UT, and L supraspinatus for increased tissue extensibility and pain modulation   Supine shoulder mobilizations, PA's, inferior glides, grades II-III, for pain modulation and improved ROM of the shoulder,    TE- To improve strength, endurance, mobility, and function of specific targeted muscle groups or improve joint range of motion or improve muscle flexibility  Supine chest press with PVC and 3# AW donned to dowel, 2x10  Supine shoulder flexion with PVC pipe and 3# AW donned to dowel, 2x10  PROM of L shoulder in all planes of mobility, cues for relaxation, slight overpressure applied at soft end ranges, 30 sec bouts  Standing scapular retractions with 12.5# at cable column, 2x10   PATIENT EDUCATION: Education details: Pt educated on importance of maintaining movement in L shoulder for healing purposes and to prevent frozen shoulder. Pt educated on HEP and provided with full demonstration as well as cues to prevent pushing AROM passed point of severe pain Person educated: Patient Education method: Explanation, Demonstration, Verbal cues, and Handouts Education comprehension: verbalized understanding and returned demonstration  HOME EXERCISE PROGRAM: Access Code: [email or web address] URL: [email or web address] Date: 05/22/2024 Prepared by: Lonni Gainer  Exercises - Seated Shoulder Flexion Towel Slide at Table Top  - 2 x daily - 7 x weekly - 10 reps - 5 sec hold - Seated Shoulder Abduction Towel Slide at Table Top  - 2 x daily - 7 x weekly - 10 reps - 5 sec hold - Supine Isometric Shoulder Extension with Towel  - 2 x daily - 7 x weekly - 2 sets - 10 reps  ASSESSMENT:  CLINICAL IMPRESSION:   Pt responded well to the exercises and manual therapy  approach, noting a reduction in overall pain at the end of the session.  Pt also introduced to increased resistance for the exercises and responded well to that without any negative effect.  Pt will continue to shift more towards exercises instead of manual therapy in order to provide increased stability of the should capsule.   Pt will continue to benefit from skilled therapy to address remaining deficits in order to improve overall QoL and return to PLOF.        OBJECTIVE IMPAIRMENTS: decreased activity tolerance, decreased mobility, decreased ROM, decreased strength, hypomobility, impaired flexibility, impaired sensation, impaired UE functional use, improper body mechanics, postural dysfunction, and pain.   ACTIVITY LIMITATIONS: carrying, lifting, sleeping, transfers, bed mobility, bathing, toileting, dressing, self feeding, reach over head, hygiene/grooming, locomotion level, and caring for others  PARTICIPATION LIMITATIONS: meal prep, cleaning, laundry, driving, shopping, community activity, and yard work  PERSONAL FACTORS: Age, Profession, Time since onset of injury/illness/exacerbation, and 3+ comorbidities: abdominal hernia, aortic ejection murmur, chronic hepatitis C, depression, DM, GERD, headache, hepatic cirrhosis, HTN, anemia, irregular heart rhythm, palpitations are also affecting patient's functional outcome.   REHAB POTENTIAL: Good  CLINICAL DECISION MAKING: Evolving/moderate complexity  EVALUATION COMPLEXITY: Moderate   GOALS: Goals reviewed with patient? Yes  SHORT TERM GOALS: Target date: 06/06/2024   Patient will be independent in home exercise program to improve strength/mobility for better functional independence with ADLs. Baseline: see HEP above Goal status: INITIAL   LONG TERM GOALS: Target date: 07/11/2024  Patient will decrease Quick DASH score by > 8 points demonstrating reduced self-reported upper extremity disability. Baseline: 88.6 / 100 = 88.6 % Goal  status: INITIAL  2.  Patient will improve L shoulder AROM to > 140 degrees of  flexion and abduction for improved ability to perform overhead activities. Baseline: flexion- 65 active, 120 passive; abduction- 60 active, 100 passive Goal status: INITIAL  3.  Pt will report worst shoulder pain of 3/10 on NPS to demonstrated improvements in pain and to allow for improved UE function Baseline: 7.5/10 Goal status: INITIAL  4.  Pt will increase L UE strength to 4+/5 or better to   Baseline: assess at future session Goal status: INITIAL   PLAN:  PT FREQUENCY: 2x/week  PT DURATION: 8 weeks  PLANNED INTERVENTIONS: 97164- PT Re-evaluation, 97750- Physical Performance Testing, 97110-Therapeutic exercises, 97530- Therapeutic activity, [id number]- Neuromuscular re-education, 97535- Self Care, [id number]- Manual therapy, [id number]- Orthotic Initial, [id number]- Orthotic/Prosthetic subsequent, [id number]- Canalith repositioning, [id number]- Splinting, [id number]- Electrical stimulation (manual), [id number]- Ultrasound, [id number]- Traction (mechanical), 20560 (1-2 muscles), 20561 (3+ muscles)- Dry Needling, Patient/Family education, Balance training, Stair training, Taping, Joint mobilization, Spinal mobilization, Vestibular training, DME instructions, Cryotherapy, and Moist heat  PLAN FOR NEXT SESSION:   Progress HEP L shoulder AAROM L shoulder manual therapy/joint mobilizations as tolerated/needed for pain control, isometrics Increase shoulder exercises to provide increased stability   Fonda Simpers, PT, DPT Physical Therapist - [facility]  [facility]  06/18/24, 5:56 PM

## 2024-06-20 ENCOUNTER — Ambulatory Visit

## 2024-06-20 DIAGNOSIS — G8929 Other chronic pain: Secondary | ICD-10-CM

## 2024-06-20 DIAGNOSIS — M6281 Muscle weakness (generalized): Secondary | ICD-10-CM

## 2024-06-20 DIAGNOSIS — M25612 Stiffness of left shoulder, not elsewhere classified: Secondary | ICD-10-CM

## 2024-06-20 NOTE — Therapy (Signed)
 OUTPATIENT PHYSICAL THERAPY SHOULDER TREATMENT   Patient Name: Nani Ingram MRN: [id number] DOB:12/14/1955, 68 y.o., female Today's Date: 06/20/2024  END OF SESSION:   PT End of Session - 06/20/24 0858     Visit Number 8    Number of Visits 16    Date for PT Re-Evaluation 07/11/24    Progress Note Due on Visit 10    PT Start Time 0808    PT Stop Time 0853    PT Time Calculation (min) 45 min    Activity Tolerance Patient tolerated treatment well;Patient limited by pain    Behavior During Therapy [facility] for tasks assessed/performed            Past Medical History:  Diagnosis Date   Abdominal hernia    Allergic genetic state    Aortic ejection murmur    Ascites    Chronic hepatitis C (HCC)    Depression    Diabetes mellitus without complication (HCC)    Dysrhythmia    GERD (gastroesophageal reflux disease)    GIB (gastrointestinal bleeding)    Headache    Hepatic cirrhosis (HCC)    Hepatitis C    Hernia, abdominal    Hyperlipidemia    Hypertension    IDA (iron  deficiency anemia)    Inflamed seborrheic keratosis    Interdigital neuroma of foot    Irregular heart rhythm    Liver disease    Multiple renal cysts    Palpitations    Pancytopenia (HCC)    Portal hypertension with esophageal varices (HCC)    Splenomegaly    UTI (urinary tract infection)    Past Surgical History:  Procedure Laterality Date   ABDOMINAL HYSTERECTOMY     CHOLECYSTECTOMY     COLONOSCOPY WITH PROPOFOL  N/A 12/05/2023   Procedure: COLONOSCOPY WITH PROPOFOL ;  Surgeon: Toledo, Ladell POUR, MD;  Location: [facility];  Service: Gastroenterology;  Laterality: N/A;  DM   ESOPHAGOGASTRODUODENOSCOPY N/A 02/17/2021   Procedure: ESOPHAGOGASTRODUODENOSCOPY (EGD);  Surgeon: Toledo, Ladell POUR, MD;  Location: [facility];  Service: Gastroenterology;  Laterality: N/A;   ESOPHAGOGASTRODUODENOSCOPY (EGD) WITH PROPOFOL  N/A 11/23/2017   Procedure: ESOPHAGOGASTRODUODENOSCOPY (EGD) WITH PROPOFOL ;   Surgeon: Toledo, Ladell POUR, MD;  Location: [facility];  Service: Gastroenterology;  Laterality: N/A;   ESOPHAGOGASTRODUODENOSCOPY (EGD) WITH PROPOFOL  N/A 10/23/2018   Procedure: ESOPHAGOGASTRODUODENOSCOPY (EGD) WITH PROPOFOL ;  Surgeon: Toledo, Ladell POUR, MD;  Location: [facility];  Service: Gastroenterology;  Laterality: N/A;   ESOPHAGOGASTRODUODENOSCOPY (EGD) WITH PROPOFOL  N/A 10/10/2019   Procedure: ESOPHAGOGASTRODUODENOSCOPY (EGD) WITH PROPOFOL ;  Surgeon: Toledo, Ladell POUR, MD;  Location: [facility];  Service: Gastroenterology;  Laterality: N/A;   ESOPHAGOGASTRODUODENOSCOPY (EGD) WITH PROPOFOL  N/A 11/14/2019   Procedure: ESOPHAGOGASTRODUODENOSCOPY (EGD) WITH PROPOFOL ;  Surgeon: Toledo, Ladell POUR, MD;  Location: [facility];  Service: Gastroenterology;  Laterality: N/A;   ESOPHAGOGASTRODUODENOSCOPY (EGD) WITH PROPOFOL  N/A 12/05/2023   Procedure: ESOPHAGOGASTRODUODENOSCOPY (EGD) WITH PROPOFOL ;  Surgeon: Toledo, Ladell POUR, MD;  Location: [facility];  Service: Gastroenterology;  Laterality: N/A;   right elbow surgery     WISDOM TOOTH EXTRACTION     Patient Active Problem List   Diagnosis Date Noted   Renal mass, left 04/11/2020   Hypertension 03/18/2020   Hepatic cirrhosis due to chronic hepatitis C infection (HCC) 11/28/2019   Chronic pain of both knees 03/26/2019   Other ascites 03/26/2019   Aortic stenosis, mild 01/07/2019   Iron  deficiency anemia 12/31/2018   Hepatic encephalopathy (HCC) 07/20/2018   Frequent PVCs 06/13/2018   Portal hypertension with esophageal varices (  HCC) 06/12/2018   Aortic ejection murmur 05/29/2018   Dizziness 05/29/2018   Hyperlipidemia, mixed 05/29/2018   Palpitations 05/29/2018   Controlled type 2 diabetes mellitus without complication, without long-term current use of insulin  (HCC) 05/17/2018   Abdominal pain, diffuse 12/14/2017   Gastroesophageal reflux disease without esophagitis 12/14/2017   Pancytopenia (HCC) 12/10/2017   GIB  (gastrointestinal bleeding) 11/21/2017    PCP: Lynwood Null, MD  REFERRING PROVIDER: Ozell Flake, MD  REFERRING DIAG: S46.012D (ICD-10-CM) - Strain of muscle(s) and tendon(s) of the rotator cuff of left shoulder, subsequent encounter   THERAPY DIAG:  Muscle weakness (generalized)  Chronic left shoulder pain  Decreased range of motion of shoulder, left  Rationale for Evaluation and Treatment: Rehabilitation  ONSET DATE: October 2024  SUBJECTIVE:                                                                                                                                                                                      SUBJECTIVE STATEMENT:   Pt reports feeling some better - much better than the end of last week.   Pt notes 6-7/10 Left shoulder pain.     PERTINENT HISTORY: Pt was seen by PCP at [facility] on 04/11/24 with c/c of L shoulder pain that has progressively worsened after a fall pt sustained in October of 2024. Pt states that she lifted her 35 pound suitcase onto the shuttle bus at the airport in [place] and she caught her foot on the step of the shuttle bus and pulled her suitcase and it fell backward onto her shoulder. PCP referred pt to orthopedic surgery, who saw pt on 04/17/24. Per orthopedic surgery note from 6/25: A suspected rotator cuff tear in the left shoulder followed a fall in October. Persistent pain radiating to the forearm and thumb suggests possible nerve involvement. X-ray shows mild AC degenerative changes with slight separation, but no calcific tendonitis or significant glenohumeral degenerative change. Physical examination reveals painful and weak shoulder movements, indicating a rotator cuff tear. MRI was ordered by orthopedic surgeon, with findings suggestive of mild to moderate insertional supraspinatus tendinopathy & probable small interstitial tear to anterior insertion without a full-thickness tear.  PMH significant for abdominal  hernia, aortic ejection murmur, chronic hepatitis C, depression, DM, GERD, headache, hepatic cirrhosis, HTN, anemia, irregular heart rhythm, palpitations  Pt states that the orthopedic surgeon wants her to do 4 weeks of PT and if the pain does not improve then they will do surgery.   PAIN:  Are you having pain? Yes: NPRS scale: 7/10 currently Pain location: Anterior shoulder, into lateral deltoid Pain description: aching Aggravating factors: Reaching overhead, behind her, forward  Relieving factors: Biofreeze helped initially  PRECAUTIONS: None  RED FLAGS: None   WEIGHT BEARING RESTRICTIONS: No  FALLS:  Has patient fallen in last 6 months? 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  DIAGNOSTIC FINDINGS:  MRI Shoulder Left WO Contrast (from 04/21/24):  FINDINGS: There is mild to moderate insertional tendinopathy of the supraspinatus tendon and a small interstitial tear to the anterior insertion. No retraction or full-thickness tear. Mild impingement change deep to the infraspinatus insertion. The subscapularis and teres minor tendons are unremarkable. There is mild-to-moderate tendinopathy of the intra-articular biceps tendon greater distally. Fibers are intact.   There is no labral tear. Mild glenohumeral and acromioclavicular osteoarthritis with joint space  narrowing and mild degenerative edema.   IMPRESSION: Mild to moderate insertional tendinopathy of the supraspinatus tendon and a probable small interstitial partial tear to the anterior insertion without retraction or full-thickness tear.   Mild-to-moderate tendinopathy of the intra-articular biceps tendon.   Mild glenohumeral and acromioclavicular osteoarthritis.  PATIENT SURVEYS:  QuickDASH: 88.6 / 100 = 88.6 %  COGNITION: Overall cognitive status: Within functional limits for tasks assessed     SENSATION: WFL Light touch: Pt reports that it feels duller on her L side around her shoulder  POSTURE: Slight forward head, rounded shoulders- pt holds L UE in guarded position against body at rest  CERVICAL ROM: all motions appeared WFL, pt has pain at end range extension and end range rotation to L  UPPER EXTREMITY ROM:    Active ROM Right eval Left eval  Shoulder flexion 150 65* active, 120 passive  Shoulder extension    Shoulder abduction 165 60* active, 100 passive  Shoulder adduction    Shoulder internal rotation 60 53  Shoulder external rotation 75 50  Elbow flexion    Elbow extension    Wrist flexion    Wrist extension    Wrist ulnar deviation    Wrist radial deviation    Wrist pronation    Wrist supination    (Blank rows = not tested)  UPPER EXTREMITY MMT: Unable to assess at eval due to significant pain/ROM limitations  MMT Right eval Left eval  Shoulder flexion    Shoulder extension    Shoulder abduction    Shoulder adduction    Shoulder internal rotation    Shoulder external rotation    Middle trapezius    Lower trapezius    Elbow flexion    Elbow extension    Wrist flexion    Wrist extension    Wrist ulnar deviation    Wrist radial deviation    Wrist pronation    Wrist supination    Grip strength (lbs)    (Blank rows = not tested)  SHOULDER SPECIAL TESTS: Rotator cuff assessment: Full can test: positive    PALPATION:  Tender on  palpation of L anterior shoulder, L AC  joint, Supraspinatus tendon                                                                                                                             TREATMENT DATE: 06/20/24   Manual:   STM with TP release technique to L biceps, L UT, and L supraspinatus for increased tissue extensibility and pain modulation   Supine shoulder mobilizations, PA's, inferior glides, grades II-III, for pain modulation and improved ROM of the shoulder,    TE- To improve strength, endurance, mobility, and function of specific targeted muscle groups or improve joint range of motion or improve muscle flexibility  Standing wall LUE slides in flexion (Just below pain) 2 x 10  Standing wall LUE slides in scaption (go just below pain) 2 x 10  Standing scapular retractions with 12.5# at cable column, 2x10 Seated resistive YTB shoulder ER BUE 2x10 Sidelye resistive 1# DB L shoulder ER 2 x 10 with towel roll  PROM of L shoulder in all planes of mobility, cues for relaxation, slight overpressure applied at soft end ranges, 30 sec bouts  *Patient reports feeling much better after session rating L shoulder pain at 3/10  Self management:  Review of above performed therex using medbridge website and reviewed videos and issued handout.  Reviewed anatomy of RC as well so patient would understand which muscle groups she was working on today.   PATIENT EDUCATION: Education details: Pt educated on importance of maintaining movement in L shoulder for healing purposes and to prevent frozen shoulder. Pt educated on HEP and provided with full demonstration as well as cues to prevent pushing AROM passed point of severe pain Person educated: Patient Education method: Explanation, Demonstration, Verbal cues, and Handouts Education comprehension: verbalized understanding and returned demonstration  HOME EXERCISE PROGRAM: Access Code: [email or web address] URL:  [email or web address] Date: 05/22/2024 Prepared by: Lonni Gainer  Exercises - Seated Shoulder Flexion Towel Slide at Table Top  - 2 x daily - 7 x weekly - 10 reps - 5 sec hold - Seated Shoulder Abduction Towel Slide at Table Top  - 2 x daily - 7 x weekly - 10 reps - 5 sec hold - Supine Isometric Shoulder Extension with Towel  - 2 x daily - 7 x weekly - 2 sets - 10 reps  ASSESSMENT:  CLINICAL IMPRESSION:   Pt continues to respond well to treatment. Able to progress per plan of care from previous visit including adding some resistive shoulder strengthening and ROM (all within pain free limit). She again reported at end of session feeling much better - pain reduction down to 3/10 at end of session.  Pt will continue to shift more towards exercises instead of manual therapy in order to provide increased stability of the should capsule.   Pt will continue to benefit from skilled therapy to address remaining deficits in order to improve overall QoL and return to PLOF.        OBJECTIVE IMPAIRMENTS: decreased activity  tolerance, decreased mobility, decreased ROM, decreased strength, hypomobility, impaired flexibility, impaired sensation, impaired UE functional use, improper body mechanics, postural dysfunction, and pain.   ACTIVITY LIMITATIONS: carrying, lifting, sleeping, transfers, bed mobility, bathing, toileting, dressing, self feeding, reach over head, hygiene/grooming, locomotion level, and caring for others  PARTICIPATION LIMITATIONS: meal prep, cleaning, laundry, driving, shopping, community activity, and yard work  PERSONAL FACTORS: Age, Profession, Time since onset of injury/illness/exacerbation, and 3+ comorbidities: abdominal hernia, aortic ejection murmur, chronic hepatitis C, depression, DM, GERD, headache, hepatic cirrhosis, HTN, anemia, irregular heart rhythm, palpitations are also affecting patient's functional outcome.   REHAB POTENTIAL: Good  CLINICAL  DECISION MAKING: Evolving/moderate complexity  EVALUATION COMPLEXITY: Moderate   GOALS: Goals reviewed with patient? Yes  SHORT TERM GOALS: Target date: 06/06/2024   Patient will be independent in home exercise program to improve strength/mobility for better functional independence with ADLs. Baseline: see HEP above Goal status: INITIAL   LONG TERM GOALS: Target date: 07/11/2024  Patient will decrease Quick DASH score by > 8 points demonstrating reduced self-reported upper extremity disability. Baseline: 88.6 / 100 = 88.6 % Goal status: INITIAL  2.  Patient will improve L shoulder AROM to > 140 degrees of flexion and abduction for improved ability to perform overhead activities. Baseline: flexion- 65 active, 120 passive; abduction- 60 active, 100 passive Goal status: INITIAL  3.  Pt will report worst shoulder pain of 3/10 on NPS to demonstrated improvements in pain and to allow for improved UE function Baseline: 7.5/10 Goal status: INITIAL  4.  Pt will increase L UE strength to 4+/5 or better to   Baseline: assess at future session Goal status: INITIAL   PLAN:  PT FREQUENCY: 2x/week  PT DURATION: 8 weeks  PLANNED INTERVENTIONS: [id number]- PT Re-evaluation, 97750- Physical Performance Testing, 97110-Therapeutic exercises, 97530- Therapeutic activity, [id number]- Neuromuscular re-education, 97535- Self Care, [id number]- Manual therapy, [id number]- Orthotic Initial, [id number]- Orthotic/Prosthetic subsequent, [phone]- Canalith repositioning, [id number]- Splinting, [id number]- Electrical stimulation (manual), [id number]- Ultrasound, [id number]- Traction (mechanical), 20560 (1-2 muscles), 20561 (3+ muscles)- Dry Needling, Patient/Family education, Balance training, Stair training, Taping, Joint mobilization, Spinal mobilization, Vestibular training, DME instructions, Cryotherapy, and Moist heat  PLAN FOR NEXT SESSION:   Review and Progress HEP L shoulder AAROM/AROM activities L shoulder manual therapy/joint mobilizations  as tolerated/needed for pain control, isometrics Increase shoulder exercises to provide increased stability  Chyrl London, PT Physical Therapist - [facility]  06/20/24, 9:11 AM

## 2024-06-26 ENCOUNTER — Ambulatory Visit: Attending: Orthopedic Surgery

## 2024-06-26 DIAGNOSIS — M25612 Stiffness of left shoulder, not elsewhere classified: Secondary | ICD-10-CM | POA: Insufficient documentation

## 2024-06-26 DIAGNOSIS — M25512 Pain in left shoulder: Secondary | ICD-10-CM | POA: Insufficient documentation

## 2024-06-26 DIAGNOSIS — G8929 Other chronic pain: Secondary | ICD-10-CM | POA: Diagnosis present

## 2024-06-26 DIAGNOSIS — M6281 Muscle weakness (generalized): Secondary | ICD-10-CM | POA: Diagnosis present

## 2024-06-26 NOTE — Therapy (Signed)
 OUTPATIENT PHYSICAL THERAPY SHOULDER TREATMENT   Patient Name: Adasia Hoar MRN: [id number] DOB:10/15/1956, 68 y.o., female Today's Date: 06/26/2024  END OF SESSION:   PT End of Session - 06/26/24 1452     Visit Number 9    Number of Visits 16    Date for PT Re-Evaluation 07/11/24    Progress Note Due on Visit 10    PT Start Time 1349    PT Stop Time 1430    PT Time Calculation (min) 41 min    Activity Tolerance Patient tolerated treatment well;Patient limited by pain    Behavior During Therapy WFL for tasks assessed/performed            Past Medical History:  Diagnosis Date   Abdominal hernia    Allergic genetic state    Aortic ejection murmur    Ascites    Chronic hepatitis C (HCC)    Depression    Diabetes mellitus without complication (HCC)    Dysrhythmia    GERD (gastroesophageal reflux disease)    GIB (gastrointestinal bleeding)    Headache    Hepatic cirrhosis (HCC)    Hepatitis C    Hernia, abdominal    Hyperlipidemia    Hypertension    IDA (iron  deficiency anemia)    Inflamed seborrheic keratosis    Interdigital neuroma of foot    Irregular heart rhythm    Liver disease    Multiple renal cysts    Palpitations    Pancytopenia (HCC)    Portal hypertension with esophageal varices (HCC)    Splenomegaly    UTI (urinary tract infection)    Past Surgical History:  Procedure Laterality Date   ABDOMINAL HYSTERECTOMY     CHOLECYSTECTOMY     COLONOSCOPY WITH PROPOFOL  N/A 12/05/2023   Procedure: COLONOSCOPY WITH PROPOFOL ;  Surgeon: Toledo, Ladell POUR, MD;  Location: [facility];  Service: Gastroenterology;  Laterality: N/A;  DM   ESOPHAGOGASTRODUODENOSCOPY N/A 02/17/2021   Procedure: ESOPHAGOGASTRODUODENOSCOPY (EGD);  Surgeon: Toledo, Ladell POUR, MD;  Location: [facility];  Service: Gastroenterology;  Laterality: N/A;   ESOPHAGOGASTRODUODENOSCOPY (EGD) WITH PROPOFOL  N/A 11/23/2017   Procedure: ESOPHAGOGASTRODUODENOSCOPY (EGD) WITH PROPOFOL ;   Surgeon: Toledo, Ladell POUR, MD;  Location: [facility];  Service: Gastroenterology;  Laterality: N/A;   ESOPHAGOGASTRODUODENOSCOPY (EGD) WITH PROPOFOL  N/A 10/23/2018   Procedure: ESOPHAGOGASTRODUODENOSCOPY (EGD) WITH PROPOFOL ;  Surgeon: Toledo, Ladell POUR, MD;  Location: [facility];  Service: Gastroenterology;  Laterality: N/A;   ESOPHAGOGASTRODUODENOSCOPY (EGD) WITH PROPOFOL  N/A 10/10/2019   Procedure: ESOPHAGOGASTRODUODENOSCOPY (EGD) WITH PROPOFOL ;  Surgeon: Toledo, Ladell POUR, MD;  Location: [facility];  Service: Gastroenterology;  Laterality: N/A;   ESOPHAGOGASTRODUODENOSCOPY (EGD) WITH PROPOFOL  N/A 11/14/2019   Procedure: ESOPHAGOGASTRODUODENOSCOPY (EGD) WITH PROPOFOL ;  Surgeon: Toledo, Ladell POUR, MD;  Location: [facility];  Service: Gastroenterology;  Laterality: N/A;   ESOPHAGOGASTRODUODENOSCOPY (EGD) WITH PROPOFOL  N/A 12/05/2023   Procedure: ESOPHAGOGASTRODUODENOSCOPY (EGD) WITH PROPOFOL ;  Surgeon: Toledo, Ladell POUR, MD;  Location: [facility];  Service: Gastroenterology;  Laterality: N/A;   right elbow surgery     WISDOM TOOTH EXTRACTION     Patient Active Problem List   Diagnosis Date Noted   Renal mass, left 04/11/2020   Hypertension 03/18/2020   Hepatic cirrhosis due to chronic hepatitis C infection (HCC) 11/28/2019   Chronic pain of both knees 03/26/2019   Other ascites 03/26/2019   Aortic stenosis, mild 01/07/2019   Iron  deficiency anemia 12/31/2018   Hepatic encephalopathy (HCC) 07/20/2018   Frequent PVCs 06/13/2018   Portal hypertension with esophageal varices (  HCC) 06/12/2018   Aortic ejection murmur 05/29/2018   Dizziness 05/29/2018   Hyperlipidemia, mixed 05/29/2018   Palpitations 05/29/2018   Controlled type 2 diabetes mellitus without complication, without long-term current use of insulin  (HCC) 05/17/2018   Abdominal pain, diffuse 12/14/2017   Gastroesophageal reflux disease without esophagitis 12/14/2017   Pancytopenia (HCC) 12/10/2017   GIB  (gastrointestinal bleeding) 11/21/2017    PCP: Lynwood Null, MD  REFERRING PROVIDER: Ozell Flake, MD  REFERRING DIAG: S46.012D (ICD-10-CM) - Strain of muscle(s) and tendon(s) of the rotator cuff of left shoulder, subsequent encounter   THERAPY DIAG:  Muscle weakness (generalized)  Chronic left shoulder pain  Decreased range of motion of shoulder, left  Rationale for Evaluation and Treatment: Rehabilitation  ONSET DATE: October 2024  SUBJECTIVE:                                                                                                                                                                                      SUBJECTIVE STATEMENT:   Pt reports 5/10 pain upon arrival to the clinic.  Pt noting that she is feeling better today.   PERTINENT HISTORY: Pt was seen by PCP at [facility] on 04/11/24 with c/c of L shoulder pain that has progressively worsened after a fall pt sustained in October of 2024. Pt states that she lifted her 35 pound suitcase onto the shuttle bus at the airport in [place] and she caught her foot on the step of the shuttle bus and pulled her suitcase and it fell backward onto her shoulder. PCP referred pt to orthopedic surgery, who saw pt on 04/17/24. Per orthopedic surgery note from 6/25: A suspected rotator cuff tear in the left shoulder followed a fall in October. Persistent pain radiating to the forearm and thumb suggests possible nerve involvement. X-ray shows mild AC degenerative changes with slight separation, but no calcific tendonitis or significant glenohumeral degenerative change. Physical examination reveals painful and weak shoulder movements, indicating a rotator cuff tear. MRI was ordered by orthopedic surgeon, with findings suggestive of mild to moderate insertional supraspinatus tendinopathy & probable small interstitial tear to anterior insertion without a full-thickness tear.  PMH significant for abdominal hernia, aortic  ejection murmur, chronic hepatitis C, depression, DM, GERD, headache, hepatic cirrhosis, HTN, anemia, irregular heart rhythm, palpitations  Pt states that the orthopedic surgeon wants her to do 4 weeks of PT and if the pain does not improve then they will do surgery.   PAIN:  Are you having pain? Yes: NPRS scale: 7/10 currently Pain location: Anterior shoulder, into lateral deltoid Pain description: aching Aggravating factors: Reaching overhead, behind her, forward Relieving factors: Biofreeze helped initially  PRECAUTIONS: None  RED FLAGS: None   WEIGHT BEARING RESTRICTIONS: No  FALLS:  Has patient fallen in last 6 months? 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  DIAGNOSTIC FINDINGS:  MRI Shoulder Left WO Contrast (from 04/21/24):  FINDINGS: There is mild to moderate insertional tendinopathy of the supraspinatus tendon and a small interstitial tear to the anterior insertion. No retraction or full-thickness tear. Mild impingement change deep to the infraspinatus insertion. The subscapularis and teres minor tendons are unremarkable. There is mild-to-moderate tendinopathy of the intra-articular biceps tendon greater distally. Fibers are intact.   There is no labral tear. Mild glenohumeral and acromioclavicular osteoarthritis with joint space narrowing and mild  degenerative edema.   IMPRESSION: Mild to moderate insertional tendinopathy of the supraspinatus tendon and a probable small interstitial partial tear to the anterior insertion without retraction or full-thickness tear.   Mild-to-moderate tendinopathy of the intra-articular biceps tendon.   Mild glenohumeral and acromioclavicular osteoarthritis.  PATIENT SURVEYS:  QuickDASH: 88.6 / 100 = 88.6 %  COGNITION: Overall cognitive status: Within functional limits for tasks assessed     SENSATION: WFL Light touch: Pt reports that it feels duller on her L side around her shoulder  POSTURE: Slight forward head, rounded shoulders- pt holds L UE in guarded position against body at rest  CERVICAL ROM: all motions appeared WFL, pt has pain at end range extension and end range rotation to L  UPPER EXTREMITY ROM:    Active ROM Right eval Left eval  Shoulder flexion 150 65* active, 120 passive  Shoulder extension    Shoulder abduction 165 60* active, 100 passive  Shoulder adduction    Shoulder internal rotation 60 53  Shoulder external rotation 75 50  Elbow flexion    Elbow extension    Wrist flexion    Wrist extension    Wrist ulnar deviation    Wrist radial deviation    Wrist pronation    Wrist supination    (Blank rows = not tested)  UPPER EXTREMITY MMT: Unable to assess at eval due to significant pain/ROM limitations  MMT Right eval Left eval  Shoulder flexion    Shoulder extension    Shoulder abduction    Shoulder adduction    Shoulder internal rotation    Shoulder external rotation    Middle trapezius    Lower trapezius    Elbow flexion    Elbow extension    Wrist flexion    Wrist extension    Wrist ulnar deviation    Wrist radial deviation    Wrist pronation    Wrist supination    Grip strength (lbs)    (Blank rows = not tested)  SHOULDER SPECIAL TESTS: Rotator cuff assessment: Full can test: positive    PALPATION:  Tender on palpation of L anterior  shoulder, L AC joint, Supraspinatus tendon  TREATMENT DATE: 06/26/24    Manual:   STM with TP release technique to L biceps, L UT, and L supraspinatus for increased tissue extensibility and pain modulation   Supine shoulder mobilizations, PA's, inferior glides, grades II-III, for pain modulation and improved ROM of the shoulder  Supine STM to cervical region to increase extensibility of the paraspinals Supine suboccipital release technique to decrease cervicalgia Supine manual traction performed in order to increase joint space in cervical region for pain relief Supine cervical upglides/downglides, 30 sec bouts Supine UT/Levator stretch, 30 sec bouts to increase tissue extensibility of the cervical region    TE- To improve strength, endurance, mobility, and function of specific targeted muscle groups or improve joint range of motion or improve muscle flexibility  Standing wall LUE slides in flexion, 2x10  Standing wall LUE slides in scaption, 2x10  Standing scapular retractions with 7.5# at cable column, 2x10  Seated AAROM with use of PVC pipe, forward flexion, 2x15 Seated AAROM with use of PVC pipe, incline chest press, 2x15    PATIENT EDUCATION: Education details: Pt educated on importance of maintaining movement in L shoulder for healing purposes and to prevent frozen shoulder. Pt educated on HEP and provided with full demonstration as well as cues to prevent pushing AROM passed point of severe pain Person educated: Patient Education method: Explanation, Demonstration, Verbal cues, and Handouts Education comprehension: verbalized understanding and returned demonstration  HOME EXERCISE PROGRAM: Access Code: [email or web address] URL: [email or web address] Date: 05/22/2024 Prepared by: Lonni Gainer  Exercises - Seated Shoulder Flexion Towel  Slide at Table Top  - 2 x daily - 7 x weekly - 10 reps - 5 sec hold - Seated Shoulder Abduction Towel Slide at Table Top  - 2 x daily - 7 x weekly - 10 reps - 5 sec hold - Supine Isometric Shoulder Extension with Towel  - 2 x daily - 7 x weekly - 2 sets - 10 reps  ASSESSMENT:  CLINICAL IMPRESSION:   Pt responded well to the manual therapy, reporting a reduction in overall pain levels.  Pt noted to have continued TP's in the L shoulder, however UT was much more pliable than in previous sessions.  Pt also able to perform more active ROM against gravity.  Pt will continue to improve overall tolerance to exercises as therapy progresses.  Pt will be reassessed at the next visit for progress report and goal assessment.   Pt will continue to benefit from skilled therapy to address remaining deficits in order to improve overall QoL and return to PLOF.         OBJECTIVE IMPAIRMENTS: decreased activity tolerance, decreased mobility, decreased ROM, decreased strength, hypomobility, impaired flexibility, impaired sensation, impaired UE functional use, improper body mechanics, postural dysfunction, and pain.   ACTIVITY LIMITATIONS: carrying, lifting, sleeping, transfers, bed mobility, bathing, toileting, dressing, self feeding, reach over head, hygiene/grooming, locomotion level, and caring for others  PARTICIPATION LIMITATIONS: meal prep, cleaning, laundry, driving, shopping, community activity, and yard work  PERSONAL FACTORS: Age, Profession, Time since onset of injury/illness/exacerbation, and 3+ comorbidities: abdominal hernia, aortic ejection murmur, chronic hepatitis C, depression, DM, GERD, headache, hepatic cirrhosis, HTN, anemia, irregular heart rhythm, palpitations are also affecting patient's functional outcome.   REHAB POTENTIAL: Good  CLINICAL DECISION MAKING: Evolving/moderate complexity  EVALUATION COMPLEXITY: Moderate   GOALS: Goals reviewed with patient? Yes  SHORT TERM GOALS:  Target date: 06/06/2024   Patient will be independent in home exercise program to improve strength/mobility for better functional independence with ADLs.  Baseline: see HEP above Goal status: INITIAL   LONG TERM GOALS: Target date: 07/11/2024  Patient will decrease Quick DASH score by > 8 points demonstrating reduced self-reported upper extremity disability. Baseline: 88.6 / 100 = 88.6 % Goal status: INITIAL  2.  Patient will improve L shoulder AROM to > 140 degrees of flexion and abduction for improved ability to perform overhead activities. Baseline: flexion- 65 active, 120 passive; abduction- 60 active, 100 passive Goal status: INITIAL  3.  Pt will report worst shoulder pain of 3/10 on NPS to demonstrated improvements in pain and to allow for improved UE function Baseline: 7.5/10 Goal status: INITIAL  4.  Pt will increase L UE strength to 4+/5 or better to   Baseline: assess at future session Goal status: INITIAL   PLAN:  PT FREQUENCY: 2x/week  PT DURATION: 8 weeks  PLANNED INTERVENTIONS: [id number]- PT Re-evaluation, 97750- Physical Performance Testing, 97110-Therapeutic exercises, 97530- Therapeutic activity, [id number]- Neuromuscular re-education, 97535- Self Care, [id number]- Manual therapy, [id number]- Orthotic Initial, [id number]- Orthotic/Prosthetic subsequent, [id number]- Canalith repositioning, [id number]- Splinting, [id number]- Electrical stimulation (manual), [id number]- Ultrasound, [id number]- Traction (mechanical), 20560 (1-2 muscles), 20561 (3+ muscles)- Dry Needling, Patient/Family education, Balance training, Stair training, Taping, Joint mobilization, Spinal mobilization, Vestibular training, DME instructions, Cryotherapy, and Moist heat  PLAN FOR NEXT SESSION:   PROGRESS NOTE! Review and Progress HEP L shoulder AAROM/AROM activities L shoulder manual therapy/joint mobilizations as tolerated/needed for pain control, isometrics Increase shoulder exercises to provide increased stability   Fonda Simpers,  PT, DPT Physical Therapist - [facility]  [facility]  06/26/24, 5:20 PM

## 2024-07-02 ENCOUNTER — Ambulatory Visit

## 2024-07-02 DIAGNOSIS — M6281 Muscle weakness (generalized): Secondary | ICD-10-CM | POA: Diagnosis not present

## 2024-07-02 DIAGNOSIS — M25612 Stiffness of left shoulder, not elsewhere classified: Secondary | ICD-10-CM

## 2024-07-02 DIAGNOSIS — G8929 Other chronic pain: Secondary | ICD-10-CM

## 2024-07-02 NOTE — Therapy (Signed)
 OUTPATIENT PHYSICAL THERAPY SHOULDER TREATMENT/PHYSICAL THERAPY PROGRESS NOTE   Dates of reporting period  05/16/24   to   07/02/24    Patient Name: Roberta Lane MRN: [id number] DOB:[birth year], 68 y.o., female Today's Date: 07/02/2024  END OF SESSION:   PT End of Session - 07/02/24 0807     Visit Number 10    Number of Visits 16    Date for PT Re-Evaluation 07/11/24    Progress Note Due on Visit 10    PT Start Time 0804    PT Stop Time 0835    PT Time Calculation (min) 31 min    Activity Tolerance Patient tolerated treatment well;Patient limited by pain    Behavior During Therapy [facility] for tasks assessed/performed          Past Medical History:  Diagnosis Date   Abdominal hernia    Allergic genetic state    Aortic ejection murmur    Ascites    Chronic hepatitis C (HCC)    Depression    Diabetes mellitus without complication (HCC)    Dysrhythmia    GERD (gastroesophageal reflux disease)    GIB (gastrointestinal bleeding)    Headache    Hepatic cirrhosis (HCC)    Hepatitis C    Hernia, abdominal    Hyperlipidemia    Hypertension    IDA (iron  deficiency anemia)    Inflamed seborrheic keratosis    Interdigital neuroma of foot    Irregular heart rhythm    Liver disease    Multiple renal cysts    Palpitations    Pancytopenia (HCC)    Portal hypertension with esophageal varices (HCC)    Splenomegaly    UTI (urinary tract infection)    Past Surgical History:  Procedure Laterality Date   ABDOMINAL HYSTERECTOMY     CHOLECYSTECTOMY     COLONOSCOPY WITH PROPOFOL  N/A 12/05/2023   Procedure: COLONOSCOPY WITH PROPOFOL ;  Surgeon: Toledo, Ladell POUR, MD;  Location: [facility];  Service: Gastroenterology;  Laterality: N/A;  DM   ESOPHAGOGASTRODUODENOSCOPY N/A 02/17/2021   Procedure: ESOPHAGOGASTRODUODENOSCOPY (EGD);  Surgeon: Toledo, Ladell POUR, MD;  Location: [facility];  Service: Gastroenterology;  Laterality: N/A;   ESOPHAGOGASTRODUODENOSCOPY (EGD) WITH  PROPOFOL  N/A 11/23/2017   Procedure: ESOPHAGOGASTRODUODENOSCOPY (EGD) WITH PROPOFOL ;  Surgeon: Toledo, Ladell POUR, MD;  Location: [facility];  Service: Gastroenterology;  Laterality: N/A;   ESOPHAGOGASTRODUODENOSCOPY (EGD) WITH PROPOFOL  N/A 10/23/2018   Procedure: ESOPHAGOGASTRODUODENOSCOPY (EGD) WITH PROPOFOL ;  Surgeon: Toledo, Ladell POUR, MD;  Location: [facility];  Service: Gastroenterology;  Laterality: N/A;   ESOPHAGOGASTRODUODENOSCOPY (EGD) WITH PROPOFOL  N/A 10/10/2019   Procedure: ESOPHAGOGASTRODUODENOSCOPY (EGD) WITH PROPOFOL ;  Surgeon: Toledo, Ladell POUR, MD;  Location: [facility];  Service: Gastroenterology;  Laterality: N/A;   ESOPHAGOGASTRODUODENOSCOPY (EGD) WITH PROPOFOL  N/A 11/14/2019   Procedure: ESOPHAGOGASTRODUODENOSCOPY (EGD) WITH PROPOFOL ;  Surgeon: Toledo, Ladell POUR, MD;  Location: [facility];  Service: Gastroenterology;  Laterality: N/A;   ESOPHAGOGASTRODUODENOSCOPY (EGD) WITH PROPOFOL  N/A 12/05/2023   Procedure: ESOPHAGOGASTRODUODENOSCOPY (EGD) WITH PROPOFOL ;  Surgeon: Toledo, Ladell POUR, MD;  Location: [facility];  Service: Gastroenterology;  Laterality: N/A;   right elbow surgery     WISDOM TOOTH EXTRACTION     Patient Active Problem List   Diagnosis Date Noted   Renal mass, left 04/11/2020   Hypertension 03/18/2020   Hepatic cirrhosis due to chronic hepatitis C infection (HCC) 11/28/2019   Chronic pain of both knees 03/26/2019   Other ascites 03/26/2019   Aortic stenosis, mild 01/07/2019   Iron  deficiency anemia 12/31/2018  Hepatic encephalopathy (HCC) 07/20/2018   Frequent PVCs 06/13/2018   Portal hypertension with esophageal varices (HCC) 06/12/2018   Aortic ejection murmur 05/29/2018   Dizziness 05/29/2018   Hyperlipidemia, mixed 05/29/2018   Palpitations 05/29/2018   Controlled type 2 diabetes mellitus without complication, without long-term current use of insulin  (HCC) 05/17/2018   Abdominal pain, diffuse 12/14/2017   Gastroesophageal reflux  disease without esophagitis 12/14/2017   Pancytopenia (HCC) 12/10/2017   GIB (gastrointestinal bleeding) 11/21/2017    PCP: Lynwood Null, MD  REFERRING PROVIDER: Ozell Flake, MD  REFERRING DIAG: S46.012D (ICD-10-CM) - Strain of muscle(s) and tendon(s) of the rotator cuff of left shoulder, subsequent encounter   THERAPY DIAG:  Muscle weakness (generalized)  Chronic left shoulder pain  Decreased range of motion of shoulder, left  Rationale for Evaluation and Treatment: Rehabilitation  ONSET DATE: October 2024  SUBJECTIVE:                                                                                                                                                                                      SUBJECTIVE STATEMENT:   Pt reports she had a fall on Saturday when stepping down from a curb.  Pt states she landed on the L shoulder again.  Pt notes she is having increased pain upon arrival today and a lack of ROM.    PERTINENT HISTORY: Pt was seen by PCP at [facility] on 04/11/24 with c/c of L shoulder pain that has progressively worsened after a fall pt sustained in October of 2024. Pt states that she lifted her 35 pound suitcase onto the shuttle bus at the airport in [place] and she caught her foot on the step of the shuttle bus and pulled her suitcase and it fell backward onto her shoulder. PCP referred pt to orthopedic surgery, who saw pt on 04/17/24. Per orthopedic surgery note from 6/25: A suspected rotator cuff tear in the left shoulder followed a fall in October. Persistent pain radiating to the forearm and thumb suggests possible nerve involvement. X-ray shows mild AC degenerative changes with slight separation, but no calcific tendonitis or significant glenohumeral degenerative change. Physical examination reveals painful and weak shoulder movements, indicating a rotator cuff tear. MRI was ordered by orthopedic surgeon, with findings suggestive of mild to moderate  insertional supraspinatus tendinopathy & probable small interstitial tear to anterior insertion without a full-thickness tear.  PMH significant for abdominal hernia, aortic ejection murmur, chronic hepatitis C, depression, DM, GERD, headache, hepatic cirrhosis, HTN, anemia, irregular heart rhythm, palpitations  Pt states that the orthopedic surgeon wants her to do 4 weeks of PT and if the pain does not improve then they  will do surgery.   PAIN:  Are you having pain? Yes: NPRS scale: 7/10 currently Pain location: Anterior shoulder, into lateral deltoid Pain description: aching Aggravating factors: Reaching overhead, behind her, forward Relieving factors: Biofreeze helped initially  PRECAUTIONS: None  RED FLAGS: None   WEIGHT BEARING RESTRICTIONS: No  FALLS:  Has patient fallen in last 6 months? 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  DIAGNOSTIC FINDINGS:  MRI Shoulder Left WO Contrast (from 04/21/24):  FINDINGS: There is mild to moderate insertional tendinopathy of the supraspinatus tendon and a small interstitial tear to the anterior insertion. No retraction or full-thickness tear. Mild impingement change deep to the infraspinatus insertion. The subscapularis and teres minor tendons are unremarkable. There is mild-to-moderate tendinopathy of the  intra-articular biceps tendon greater distally. Fibers are intact.   There is no labral tear. Mild glenohumeral and acromioclavicular osteoarthritis with joint space narrowing and mild degenerative edema.   IMPRESSION: Mild to moderate insertional tendinopathy of the supraspinatus tendon and a probable small interstitial partial tear to the anterior insertion without retraction or full-thickness tear.   Mild-to-moderate tendinopathy of the intra-articular biceps tendon.   Mild glenohumeral and acromioclavicular osteoarthritis.  PATIENT SURVEYS:  QuickDASH: 88.6 / 100 = 88.6 %  COGNITION: Overall cognitive status: Within functional limits for tasks assessed     SENSATION: WFL Light touch: Pt reports that it feels duller on her L side around her shoulder  POSTURE: Slight forward head, rounded shoulders- pt holds L UE in guarded position against body at rest  CERVICAL ROM: all motions appeared WFL, pt has pain at end range extension and end range rotation to L  UPPER EXTREMITY ROM:    Active ROM Right eval Left eval  Shoulder flexion 150 65* active, 120 passive  Shoulder extension    Shoulder abduction 165 60* active, 100 passive  Shoulder adduction    Shoulder internal rotation 60 53  Shoulder external rotation 75 50  Elbow flexion    Elbow extension    Wrist flexion    Wrist extension    Wrist ulnar deviation    Wrist radial deviation    Wrist pronation    Wrist supination    (Blank rows = not tested)  UPPER EXTREMITY MMT: Unable to assess at eval due to significant pain/ROM limitations  MMT Right eval Left eval  Shoulder flexion    Shoulder extension    Shoulder abduction    Shoulder adduction    Shoulder internal rotation    Shoulder external rotation    Middle trapezius    Lower trapezius    Elbow flexion    Elbow extension    Wrist flexion    Wrist extension    Wrist ulnar deviation    Wrist radial deviation    Wrist pronation    Wrist  supination    Grip strength (lbs)    (  Blank rows = not tested)  SHOULDER SPECIAL TESTS: Rotator cuff assessment: Full can test: positive    PALPATION:  Tender on palpation of L anterior shoulder, L AC joint, Supraspinatus tendon                                                                                                                             TREATMENT DATE: 07/02/24   Self-Care/Home Management:  Pt received education regarding fall and potential for further injury to the GHJ and rotator cuff.  Pt limited with active ROM, and has PROM without pain at this time, similar to the evaluation.  Pt was making good progress towards goals prior to the fall and due to the signs and symptoms that are consistent with rotator cuff pathology, pt was encouraged to seek care at [facility] or another orthopaedic clinic.        PATIENT EDUCATION: Education details: Pt educated on importance of maintaining movement in L shoulder for healing purposes and to prevent frozen shoulder. Pt educated on HEP and provided with full demonstration as well as cues to prevent pushing AROM passed point of severe pain Person educated: Patient Education method: Explanation, Demonstration, Verbal cues, and Handouts Education comprehension: verbalized understanding and returned demonstration  HOME EXERCISE PROGRAM: Access Code: [email or web address] URL: [email or web address] Date: 05/22/2024 Prepared by: Lonni Gainer  Exercises - Seated Shoulder Flexion Towel Slide at Table Top  - 2 x daily - 7 x weekly - 10 reps - 5 sec hold - Seated Shoulder Abduction Towel Slide at Table Top  - 2 x daily - 7 x weekly - 10 reps - 5 sec hold - Supine Isometric Shoulder Extension with Towel  - 2 x daily - 7 x weekly - 2 sets - 10 reps  ASSESSMENT:  CLINICAL IMPRESSION:   Pt arrived to the clinic detailing a fall that occurred on Saturday.  Pt limited in her AROM of the L shoulder today, however still has good  PROM without pain.  Due to the painful symptoms that the pt is experiencing, and the nature of the injury, pt was encouraged to seek care at orthopaedic clinic to assess further.  Pt also encouraged to get a referral for balance so that this could also be addressed as well.   Pt will continue to benefit from skilled therapy to address remaining deficits in order to improve overall QoL and return to PLOF.         OBJECTIVE IMPAIRMENTS: decreased activity tolerance, decreased mobility, decreased ROM, decreased strength, hypomobility, impaired flexibility, impaired sensation, impaired UE functional use, improper body mechanics, postural dysfunction, and pain.   ACTIVITY LIMITATIONS: carrying, lifting, sleeping, transfers, bed mobility, bathing, toileting, dressing, self feeding, reach over head, hygiene/grooming, locomotion level, and caring for others  PARTICIPATION LIMITATIONS: meal prep, cleaning, laundry, driving, shopping, community activity, and yard work  PERSONAL FACTORS: Age, Profession, Time since onset of injury/illness/exacerbation, and 3+ comorbidities: abdominal hernia, aortic ejection murmur, chronic hepatitis C,  depression, DM, GERD, headache, hepatic cirrhosis, HTN, anemia, irregular heart rhythm, palpitations are also affecting patient's functional outcome.   REHAB POTENTIAL: Good  CLINICAL DECISION MAKING: Evolving/moderate complexity  EVALUATION COMPLEXITY: Moderate   GOALS: Goals reviewed with patient? Yes  SHORT TERM GOALS: Target date: 06/06/2024   Patient will be independent in home exercise program to improve strength/mobility for better functional independence with ADLs. Baseline: see HEP above Goal status: MET   LONG TERM GOALS: Target date: 07/11/2024  Patient will decrease Quick DASH score by > 8 points demonstrating reduced self-reported upper extremity disability. Baseline: 88.6 / 100 = 88.6 % 07/02/24:  Goal status: INITIAL  2.  Patient will improve L  shoulder AROM to > 140 degrees of flexion and abduction for improved ability to perform overhead activities. Baseline: flexion- 65 active, 120 passive; abduction- 60 active, 100 passive 07/02/24: Flexion: 54 deg active; Abduction: 48 deg Goal status: NOT PROGRESSING  3.  Pt will report worst shoulder pain of 3/10 on NPS to demonstrated improvements in pain and to allow for improved UE function Baseline: 7.5/10 07/02/24: 7/10 Goal status: PROGRESSING  4.  Pt will increase L UE strength to 4+/5 or better to   Baseline: assess at future session 07/02/24: not able to assess due to pain Goal status: INITIAL   PLAN:  PT FREQUENCY: 2x/week  PT DURATION: 8 weeks  PLANNED INTERVENTIONS: [id number]- PT Re-evaluation, 97750- Physical Performance Testing, 97110-Therapeutic exercises, 97530- Therapeutic activity, [id number]- Neuromuscular re-education, 97535- Self Care, [id number]- Manual therapy, [id number]- Orthotic Initial, [id number]- Orthotic/Prosthetic subsequent, [id number]- Canalith repositioning, [id number]- Splinting, [id number]- Electrical stimulation (manual), [id number]- Ultrasound, [id number]- Traction (mechanical), 20560 (1-2 muscles), 20561 (3+ muscles)- Dry Needling, Patient/Family education, Balance training, Stair training, Taping, Joint mobilization, Spinal mobilization, Vestibular training, DME instructions, Cryotherapy, and Moist heat  PLAN FOR NEXT SESSION:   Ask about ortho visit Review and Progress HEP L shoulder AAROM/AROM activities L shoulder manual therapy/joint mobilizations as tolerated/needed for pain control, isometrics Increase shoulder exercises to provide increased stability   Fonda Simpers, PT, DPT Physical Therapist - [facility]  [facility]  07/02/24, 8:44 AM

## 2024-07-05 ENCOUNTER — Ambulatory Visit: Admitting: Physical Therapy

## 2024-07-08 ENCOUNTER — Ambulatory Visit

## 2024-07-11 ENCOUNTER — Encounter

## 2024-07-16 ENCOUNTER — Encounter

## 2024-07-19 ENCOUNTER — Ambulatory Visit: Admitting: Physical Therapy

## 2024-07-23 ENCOUNTER — Ambulatory Visit

## 2024-07-23 DIAGNOSIS — G8929 Other chronic pain: Secondary | ICD-10-CM

## 2024-07-23 DIAGNOSIS — R2689 Other abnormalities of gait and mobility: Secondary | ICD-10-CM

## 2024-07-23 DIAGNOSIS — M25612 Stiffness of left shoulder, not elsewhere classified: Secondary | ICD-10-CM

## 2024-07-23 DIAGNOSIS — M6281 Muscle weakness (generalized): Secondary | ICD-10-CM | POA: Diagnosis not present

## 2024-07-23 NOTE — Therapy (Addendum)
 OUTPATIENT PHYSICAL THERAPY SHOULDER TREATMENT/RE-CERT   Patient Name: Roberta Lane MRN: [id number] DOB:07-13-1956, 68 y.o., female Today's Date: 07/23/2024  END OF SESSION:   PT End of Session - 07/23/24 0941     Visit Number 11    Number of Visits 16    Date for Recertification  08/22/24    Progress Note Due on Visit 10    PT Start Time 0934    PT Stop Time 1015    PT Time Calculation (min) 41 min    Activity Tolerance Patient tolerated treatment well;Patient limited by pain    Behavior During Therapy [facility] for tasks assessed/performed         Past Medical History:  Diagnosis Date   Abdominal hernia    Allergic genetic state    Aortic ejection murmur    Ascites    Chronic hepatitis C (HCC)    Depression    Diabetes mellitus without complication (HCC)    Dysrhythmia    GERD (gastroesophageal reflux disease)    GIB (gastrointestinal bleeding)    Headache    Hepatic cirrhosis (HCC)    Hepatitis C    Hernia, abdominal    Hyperlipidemia    Hypertension    IDA (iron  deficiency anemia)    Inflamed seborrheic keratosis    Interdigital neuroma of foot    Irregular heart rhythm    Liver disease    Multiple renal cysts    Palpitations    Pancytopenia (HCC)    Portal hypertension with esophageal varices (HCC)    Splenomegaly    UTI (urinary tract infection)    Past Surgical History:  Procedure Laterality Date   ABDOMINAL HYSTERECTOMY     CHOLECYSTECTOMY     COLONOSCOPY WITH PROPOFOL  N/A 12/05/2023   Procedure: COLONOSCOPY WITH PROPOFOL ;  Surgeon: Toledo, Ladell POUR, MD;  Location: [facility];  Service: Gastroenterology;  Laterality: N/A;  DM   ESOPHAGOGASTRODUODENOSCOPY N/A 02/17/2021   Procedure: ESOPHAGOGASTRODUODENOSCOPY (EGD);  Surgeon: Toledo, Ladell POUR, MD;  Location: [facility];  Service: Gastroenterology;  Laterality: N/A;   ESOPHAGOGASTRODUODENOSCOPY (EGD) WITH PROPOFOL  N/A 11/23/2017   Procedure: ESOPHAGOGASTRODUODENOSCOPY (EGD) WITH PROPOFOL ;   Surgeon: Toledo, Ladell POUR, MD;  Location: [facility];  Service: Gastroenterology;  Laterality: N/A;   ESOPHAGOGASTRODUODENOSCOPY (EGD) WITH PROPOFOL  N/A 10/23/2018   Procedure: ESOPHAGOGASTRODUODENOSCOPY (EGD) WITH PROPOFOL ;  Surgeon: Toledo, Ladell POUR, MD;  Location: [facility];  Service: Gastroenterology;  Laterality: N/A;   ESOPHAGOGASTRODUODENOSCOPY (EGD) WITH PROPOFOL  N/A 10/10/2019   Procedure: ESOPHAGOGASTRODUODENOSCOPY (EGD) WITH PROPOFOL ;  Surgeon: Toledo, Ladell POUR, MD;  Location: [facility];  Service: Gastroenterology;  Laterality: N/A;   ESOPHAGOGASTRODUODENOSCOPY (EGD) WITH PROPOFOL  N/A 11/14/2019   Procedure: ESOPHAGOGASTRODUODENOSCOPY (EGD) WITH PROPOFOL ;  Surgeon: Toledo, Ladell POUR, MD;  Location: [facility];  Service: Gastroenterology;  Laterality: N/A;   ESOPHAGOGASTRODUODENOSCOPY (EGD) WITH PROPOFOL  N/A 12/05/2023   Procedure: ESOPHAGOGASTRODUODENOSCOPY (EGD) WITH PROPOFOL ;  Surgeon: Toledo, Ladell POUR, MD;  Location: [facility];  Service: Gastroenterology;  Laterality: N/A;   right elbow surgery     WISDOM TOOTH EXTRACTION     Patient Active Problem List   Diagnosis Date Noted   Renal mass, left 04/11/2020   Hypertension 03/18/2020   Hepatic cirrhosis due to chronic hepatitis C infection (HCC) 11/28/2019   Chronic pain of both knees 03/26/2019   Other ascites 03/26/2019   Aortic stenosis, mild 01/07/2019   Iron  deficiency anemia 12/31/2018   Hepatic encephalopathy (HCC) 07/20/2018   Frequent PVCs 06/13/2018   Portal hypertension with esophageal varices (HCC) 06/12/2018  Aortic ejection murmur 05/29/2018   Dizziness 05/29/2018   Hyperlipidemia, mixed 05/29/2018   Palpitations 05/29/2018   Controlled type 2 diabetes mellitus without complication, without long-term current use of insulin  (HCC) 05/17/2018   Abdominal pain, diffuse 12/14/2017   Gastroesophageal reflux disease without esophagitis 12/14/2017   Pancytopenia (HCC) 12/10/2017   GIB  (gastrointestinal bleeding) 11/21/2017    PCP: Lynwood Null, MD  REFERRING PROVIDER: Ozell Flake, MD  REFERRING DIAG: S46.012D (ICD-10-CM) - Strain of muscle(s) and tendon(s) of the rotator cuff of left shoulder, subsequent encounter   THERAPY DIAG:  Muscle weakness (generalized)  Chronic left shoulder pain  Decreased range of motion of shoulder, left  Rationale for Evaluation and Treatment: Rehabilitation  ONSET DATE: October 2024  SUBJECTIVE:                                                                                                                                                                                      SUBJECTIVE STATEMENT:   Pt reports that she had imaging on the L shoulder since the fall and was cleared to return to therapy.  Pt was given prednisone, but has not taken it and was advised to just rest the shoulder which she did do, and notes a reduction in her overall pain level.  Pt reports otherwise she is doing well and ready to resume therapy.      PERTINENT HISTORY: Pt was seen by PCP at [facility] on 04/11/24 with c/c of L shoulder pain that has progressively worsened after a fall pt sustained in October of 2024. Pt states that she lifted her 35 pound suitcase onto the shuttle bus at the airport in [place] and she caught her foot on the step of the shuttle bus and pulled her suitcase and it fell backward onto her shoulder. PCP referred pt to orthopedic surgery, who saw pt on 04/17/24. Per orthopedic surgery note from 6/25: A suspected rotator cuff tear in the left shoulder followed a fall in October. Persistent pain radiating to the forearm and thumb suggests possible nerve involvement. X-ray shows mild AC degenerative changes with slight separation, but no calcific tendonitis or significant glenohumeral degenerative change. Physical examination reveals painful and weak shoulder movements, indicating a rotator cuff tear. MRI was ordered by orthopedic  surgeon, with findings suggestive of mild to moderate insertional supraspinatus tendinopathy & probable small interstitial tear to anterior insertion without a full-thickness tear.  PMH significant for abdominal hernia, aortic ejection murmur, chronic hepatitis C, depression, DM, GERD, headache, hepatic cirrhosis, HTN, anemia, irregular heart rhythm, palpitations  Pt states that the orthopedic surgeon wants her to do 4 weeks of PT and if the  pain does not improve then they will do surgery.   PAIN:  Are you having pain? Yes: NPRS scale: 7/10 currently Pain location: Anterior shoulder, into lateral deltoid Pain description: aching Aggravating factors: Reaching overhead, behind her, forward Relieving factors: Biofreeze helped initially  PRECAUTIONS: None  RED FLAGS: None   WEIGHT BEARING RESTRICTIONS: No  FALLS:  Has patient fallen in last 6 months? 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  DIAGNOSTIC FINDINGS:  MRI Shoulder Left WO Contrast (from 04/21/24):  FINDINGS: There is mild to moderate insertional tendinopathy of the supraspinatus tendon and a small interstitial tear to the anterior insertion. No retraction or full-thickness tear. Mild impingement change deep to the infraspinatus insertion. The subscapularis and teres minor tendons are unremarkable. There is  mild-to-moderate tendinopathy of the intra-articular biceps tendon greater distally. Fibers are intact.   There is no labral tear. Mild glenohumeral and acromioclavicular osteoarthritis with joint space narrowing and mild degenerative edema.   IMPRESSION: Mild to moderate insertional tendinopathy of the supraspinatus tendon and a probable small interstitial partial tear to the anterior insertion without retraction or full-thickness tear.   Mild-to-moderate tendinopathy of the intra-articular biceps tendon.   Mild glenohumeral and acromioclavicular osteoarthritis.  PATIENT SURVEYS:  QuickDASH: 88.6 / 100 = 88.6 %  COGNITION: Overall cognitive status: Within functional limits for tasks assessed     SENSATION: WFL Light touch: Pt reports that it feels duller on her L side around her shoulder  POSTURE: Slight forward head, rounded shoulders- pt holds L UE in guarded position against body at rest  CERVICAL ROM: all motions appeared WFL, pt has pain at end range extension and end range rotation to L  UPPER EXTREMITY ROM:    Active ROM Right eval Left eval  Shoulder flexion 150 65* active, 120 passive  Shoulder extension    Shoulder abduction 165 60* active, 100 passive  Shoulder adduction    Shoulder internal rotation 60 53  Shoulder external rotation 75 50  Elbow flexion    Elbow extension    Wrist flexion    Wrist extension    Wrist ulnar deviation    Wrist radial deviation    Wrist pronation    Wrist supination    (Blank rows = not tested)  UPPER EXTREMITY MMT: Unable to assess at eval due to significant pain/ROM limitations  MMT Right eval Left eval  Shoulder flexion    Shoulder extension    Shoulder abduction    Shoulder adduction    Shoulder internal rotation    Shoulder external rotation    Middle trapezius    Lower trapezius    Elbow flexion    Elbow extension    Wrist flexion    Wrist extension    Wrist ulnar deviation    Wrist radial  deviation    Wrist pronation    Wrist supination  Grip strength (lbs)    (Blank rows = not tested)  SHOULDER SPECIAL TESTS: Rotator cuff assessment: Full can test: positive    PALPATION:  Tender on palpation of L anterior shoulder, L AC joint, Supraspinatus tendon                                                                                                                             TREATMENT DATE: 07/23/24  PT instructed pt in TUG: 14.51 sec ( >13.5 sec indicates increased fall risk)   Five times Sit to Stand Test (FTSS)  TIME: 29.81 sec  Cut off scores indicative of increased fall risk: >12 sec CVA, >16 sec PD, >13 sec vestibular (ANPTA Core Set of Outcome Measures for Adults with Neurologic Conditions, 2018)  10 Meter Walk Test: Patient instructed to walk 10 meters (32.8 ft) as quickly and as safely as possible at their normal speed Results: 0.85 m/s (11.69 seconds)  Cut off scores:   Household Ambulator  < 0.4 m/s  Limited Community Ambulator  0.4 - 0.8 m/s  [employer]  > 0.8 m/s  Increased fall risk  < 1.39m/s  Crossing a Street  >1.30m/s  MCID 0.05 m/s (small), 0.13 m/s (moderate), 0.06 m/s (significant)  (ANPTA Core Set of Outcome Measures for Adults with Neurologic Conditions, 2018)    6 Min Walk Test:  Instructed patient to ambulate as quickly and as safely as possible for 6 minutes using LRAD. Patient was allowed to take standing rest breaks without stopping the test, but if the patient required a sitting rest break the clock would be stopped and the test would be over.  Results: 1185 feet using a no AD with supervision. Results indicate that the patient has reduced endurance with ambulation compared to age matched norms.  Age Matched Norms (in meters): 16-69 yo M: 78 F: 77, 44-79 yo M: 65 F: 471, 32-89 yo M: 417 F: 392 MDC: 58.21 meters (190.98 feet) or 50 meters (ANPTA Core Set of Outcome Measures for Adults with Neurologic Conditions,  2018)  Activities-specific Balance Confidence Scale:  Score: 24.38% Increased risk of falls in community-dwelling, older adults <80% (79.89%)  0% = no confidence - 100% = complete confidence (ANPTA Core Set of Outcome Measures for Adults with Neurologic Conditions, 2018)     PATIENT EDUCATION: Education details: Pt educated on importance of maintaining movement in L shoulder for healing purposes and to prevent frozen shoulder. Pt educated on HEP and provided with full demonstration as well as cues to prevent pushing AROM passed point of severe pain Person educated: Patient Education method: Explanation, Demonstration, Verbal cues, and Handouts Education comprehension: verbalized understanding and returned demonstration  HOME EXERCISE PROGRAM: Access Code: [email or web address] URL: [email or web address] Date: 05/22/2024 Prepared by: Lonni Gainer  Exercises - Seated Shoulder Flexion Towel Slide at Table Top  - 2 x daily - 7 x weekly - 10 reps - 5 sec hold - Seated Shoulder Abduction Towel Slide at  Table Top  - 2 x daily - 7 x weekly - 10 reps - 5 sec hold - Supine Isometric Shoulder Extension with Towel  - 2 x daily - 7 x weekly - 2 sets - 10 reps  ASSESSMENT:  CLINICAL IMPRESSION:   Pt performed physical performing testing to assess overall balance complications due to the pt getting a new referral.  Pt is hoping to improve overall balance levels along with continuing to rehab her shoulder.  Pt has considerable balance deficits as noted above in the goal section.  Patient's condition has the potential to improve in response to therapy. Maximum improvement is yet to be obtained. The anticipated improvement is attainable and reasonable in a generally predictable time.   Pt will continue to benefit from skilled therapy to address remaining deficits in order to improve overall QoL and return to PLOF.       OBJECTIVE IMPAIRMENTS: decreased activity tolerance, decreased mobility,  decreased ROM, decreased strength, hypomobility, impaired flexibility, impaired sensation, impaired UE functional use, improper body mechanics, postural dysfunction, and pain.   ACTIVITY LIMITATIONS: carrying, lifting, sleeping, transfers, bed mobility, bathing, toileting, dressing, self feeding, reach over head, hygiene/grooming, locomotion level, and caring for others  PARTICIPATION LIMITATIONS: meal prep, cleaning, laundry, driving, shopping, community activity, and yard work  PERSONAL FACTORS: Age, Profession, Time since onset of injury/illness/exacerbation, and 3+ comorbidities: abdominal hernia, aortic ejection murmur, chronic hepatitis C, depression, DM, GERD, headache, hepatic cirrhosis, HTN, anemia, irregular heart rhythm, palpitations are also affecting patient's functional outcome.   REHAB POTENTIAL: Good  CLINICAL DECISION MAKING: Evolving/moderate complexity  EVALUATION COMPLEXITY: Moderate   GOALS: Goals reviewed with patient? Yes  SHORT TERM GOALS: Target date: 06/06/2024  Patient will be independent in home exercise program to improve strength/mobility for better functional independence with ADLs. Baseline: see HEP above Goal status: MET   LONG TERM GOALS: Target date: 07/11/2024  Patient will decrease Quick DASH score by > 8 points demonstrating reduced self-reported upper extremity disability. Baseline: 88.6 / 100 = 88.6 % 07/23/24: 59.1/100 = 59.1% Goal status: INITIAL  2.  Patient will improve L shoulder AROM to > 140 degrees of flexion and abduction for improved ability to perform overhead activities. Baseline: flexion- 65 active, 120 passive; abduction- 60 active, 100 passive 07/02/24: Flexion: 54 deg active; Abduction: 48 deg Goal status: NOT PROGRESSING  3.  Pt will report worst shoulder pain of 3/10 on NPS to demonstrated improvements in pain and to allow for improved UE function Baseline: 7.5/10 07/02/24: 7/10 07/23/24: 7/10 Goal status: PROGRESSING  4.   Pt will increase L UE strength to 4+/5 or better to   Baseline: assess at future session 07/02/24: not able to assess due to pain Goal status: INITIAL  5.  Patient (> 66 years old) will complete five times sit to stand test in < 15 seconds indicating an increased LE strength and improved balance. Baseline: 29.81 sec Goal status: INITIAL   6.  Patient will increase Berg Balance score by > 6 points to demonstrate decreased fall risk during functional activities. Baseline: TBD Goal status: INITIAL   7.  Patient will reduce timed up and go to <11 seconds to reduce fall risk and demonstrate improved transfer/gait ability. Baseline: 14.51 sec Goal status: INITIAL  8.  Patient will increase 10 meter walk test to >1.90m/s as to improve gait speed for better community ambulation and to reduce fall risk. Baseline: 11.69 sec; 0.85 m/s Goal status: INITIAL  9.  Patient will increase six  minute walk test distance to >1300 for progression to community ambulator and improve gait ability. Baseline: 1185' Goal status: INITIAL  10.  Pt will improve ABC by at least 13% in order to demonstrate clinically significant improvement in balance confidence. Baseline: 24.38% Goal status: INITIAL     PLAN:  PT FREQUENCY: 2x/week  PT DURATION: 8 weeks  PLANNED INTERVENTIONS: [id number]- PT Re-evaluation, 97750- Physical Performance Testing, 97110-Therapeutic exercises, 97530- Therapeutic activity, [id number]- Neuromuscular re-education, 97535- Self Care, [id number]- Manual therapy, [id number]- Orthotic Initial, [id number]- Orthotic/Prosthetic subsequent, [phone]- Canalith repositioning, [id number]- Splinting, [id number]- Electrical stimulation (manual), [id number]- Ultrasound, [id number]- Traction (mechanical), 20560 (1-2 muscles), 20561 (3+ muscles)- Dry Needling, Patient/Family education, Balance training, Stair training, Taping, Joint mobilization, Spinal mobilization, Vestibular training, DME instructions, Cryotherapy, and Moist heat  PLAN FOR NEXT  SESSION:   Ask about ortho visit Review and Progress HEP L shoulder AAROM/AROM activities L shoulder manual therapy/joint mobilizations as tolerated/needed for pain control, isometrics Increase shoulder exercises to provide increased stability   Fonda Simpers, PT, DPT Physical Therapist - [facility]  [facility]  07/23/24, 11:41 AM

## 2024-07-25 ENCOUNTER — Ambulatory Visit

## 2024-07-25 NOTE — Therapy (Incomplete)
 OUTPATIENT PHYSICAL THERAPY SHOULDER TREATMENT/RE-CERT   Patient Name: Roberta Lane MRN: [id number] DOB:1956/01/26, 68 y.o., female Today's Date: 07/25/2024  END OF SESSION:    Past Medical History:  Diagnosis Date   Abdominal hernia    Allergic genetic state    Aortic ejection murmur    Ascites    Chronic hepatitis C (HCC)    Depression    Diabetes mellitus without complication (HCC)    Dysrhythmia    GERD (gastroesophageal reflux disease)    GIB (gastrointestinal bleeding)    Headache    Hepatic cirrhosis (HCC)    Hepatitis C    Hernia, abdominal    Hyperlipidemia    Hypertension    IDA (iron  deficiency anemia)    Inflamed seborrheic keratosis    Interdigital neuroma of foot    Irregular heart rhythm    Liver disease    Multiple renal cysts    Palpitations    Pancytopenia (HCC)    Portal hypertension with esophageal varices (HCC)    Splenomegaly    UTI (urinary tract infection)    Past Surgical History:  Procedure Laterality Date   ABDOMINAL HYSTERECTOMY     CHOLECYSTECTOMY     COLONOSCOPY WITH PROPOFOL  N/A 12/05/2023   Procedure: COLONOSCOPY WITH PROPOFOL ;  Surgeon: Toledo, Ladell POUR, MD;  Location: [facility];  Service: Gastroenterology;  Laterality: N/A;  DM   ESOPHAGOGASTRODUODENOSCOPY N/A 02/17/2021   Procedure: ESOPHAGOGASTRODUODENOSCOPY (EGD);  Surgeon: Toledo, Ladell POUR, MD;  Location: [facility];  Service: Gastroenterology;  Laterality: N/A;   ESOPHAGOGASTRODUODENOSCOPY (EGD) WITH PROPOFOL  N/A 11/23/2017   Procedure: ESOPHAGOGASTRODUODENOSCOPY (EGD) WITH PROPOFOL ;  Surgeon: Toledo, Ladell POUR, MD;  Location: [facility];  Service: Gastroenterology;  Laterality: N/A;   ESOPHAGOGASTRODUODENOSCOPY (EGD) WITH PROPOFOL  N/A 10/23/2018   Procedure: ESOPHAGOGASTRODUODENOSCOPY (EGD) WITH PROPOFOL ;  Surgeon: Toledo, Ladell POUR, MD;  Location: [facility];  Service: Gastroenterology;  Laterality: N/A;   ESOPHAGOGASTRODUODENOSCOPY (EGD) WITH PROPOFOL   N/A 10/10/2019   Procedure: ESOPHAGOGASTRODUODENOSCOPY (EGD) WITH PROPOFOL ;  Surgeon: Toledo, Ladell POUR, MD;  Location: [facility];  Service: Gastroenterology;  Laterality: N/A;   ESOPHAGOGASTRODUODENOSCOPY (EGD) WITH PROPOFOL  N/A 11/14/2019   Procedure: ESOPHAGOGASTRODUODENOSCOPY (EGD) WITH PROPOFOL ;  Surgeon: Toledo, Ladell POUR, MD;  Location: [facility];  Service: Gastroenterology;  Laterality: N/A;   ESOPHAGOGASTRODUODENOSCOPY (EGD) WITH PROPOFOL  N/A 12/05/2023   Procedure: ESOPHAGOGASTRODUODENOSCOPY (EGD) WITH PROPOFOL ;  Surgeon: Toledo, Ladell POUR, MD;  Location: [facility];  Service: Gastroenterology;  Laterality: N/A;   right elbow surgery     WISDOM TOOTH EXTRACTION     Patient Active Problem List   Diagnosis Date Noted   Renal mass, left 04/11/2020   Hypertension 03/18/2020   Hepatic cirrhosis due to chronic hepatitis C infection (HCC) 11/28/2019   Chronic pain of both knees 03/26/2019   Other ascites 03/26/2019   Aortic stenosis, mild 01/07/2019   Iron  deficiency anemia 12/31/2018   Hepatic encephalopathy (HCC) 07/20/2018   Frequent PVCs 06/13/2018   Portal hypertension with esophageal varices (HCC) 06/12/2018   Aortic ejection murmur 05/29/2018   Dizziness 05/29/2018   Hyperlipidemia, mixed 05/29/2018   Palpitations 05/29/2018   Controlled type 2 diabetes mellitus without complication, without long-term current use of insulin  (HCC) 05/17/2018   Abdominal pain, diffuse 12/14/2017   Gastroesophageal reflux disease without esophagitis 12/14/2017   Pancytopenia (HCC) 12/10/2017   GIB (gastrointestinal bleeding) 11/21/2017    PCP: Lynwood Null, MD  REFERRING PROVIDER: Ozell Flake, MD  REFERRING DIAG: S46.012D (ICD-10-CM) - Strain of muscle(s) and tendon(s) of the rotator cuff of left  shoulder, subsequent encounter   THERAPY DIAG:  No diagnosis found.  Rationale for Evaluation and Treatment: Rehabilitation  ONSET DATE: October 2024  SUBJECTIVE:                                                                                                                                                                                       SUBJECTIVE STATEMENT:   ***     PERTINENT HISTORY: Pt was seen by PCP at [facility] on 04/11/24 with c/c of L shoulder pain that has progressively worsened after a fall pt sustained in October of 2024. Pt states that she lifted her 35 pound suitcase onto the shuttle bus at the airport in [place] and she caught her foot on the step of the shuttle bus and pulled her suitcase and it fell backward onto her shoulder. PCP referred pt to orthopedic surgery, who saw pt on 04/17/24. Per orthopedic surgery note from 6/25: A suspected rotator cuff tear in the left shoulder followed a fall in October. Persistent pain radiating to the forearm and thumb suggests possible nerve involvement. X-ray shows mild AC degenerative changes with slight separation, but no calcific tendonitis or significant glenohumeral degenerative change. Physical examination reveals painful and weak shoulder movements, indicating a rotator cuff tear. MRI was ordered by orthopedic surgeon, with findings suggestive of mild to moderate insertional supraspinatus tendinopathy & probable small interstitial tear to anterior insertion without a full-thickness tear.  PMH significant for abdominal hernia, aortic ejection murmur, chronic hepatitis C, depression, DM, GERD, headache, hepatic cirrhosis, HTN, anemia, irregular heart rhythm, palpitations  Pt states that the orthopedic surgeon wants her to do 4 weeks of PT and if the pain does not improve then they will do surgery.   PAIN:  Are you having pain? Yes: NPRS scale: 7/10 currently Pain location: Anterior shoulder, into lateral deltoid Pain description: aching Aggravating factors: Reaching overhead, behind her, forward Relieving factors: Biofreeze helped initially  PRECAUTIONS: None  RED FLAGS: None   WEIGHT  BEARING RESTRICTIONS: No  FALLS:  Has patient fallen in last 6 months? 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  DIAGNOSTIC FINDINGS:  MRI Shoulder Left WO Contrast (from 04/21/24):  FINDINGS: There is mild to moderate insertional tendinopathy of the supraspinatus tendon and a small interstitial tear to the anterior insertion. No retraction or full-thickness tear. Mild impingement change deep to the infraspinatus insertion. The subscapularis and teres minor tendons are unremarkable. There is mild-to-moderate tendinopathy of the intra-articular biceps tendon greater distally. Fibers are intact.   There is no labral tear. Mild glenohumeral and acromioclavicular osteoarthritis with joint space narrowing and mild degenerative edema.   IMPRESSION: Mild to moderate insertional tendinopathy of the supraspinatus tendon and a probable small interstitial partial tear to the anterior insertion without retraction or full-thickness tear.   Mild-to-moderate tendinopathy of the intra-articular biceps tendon.   Mild glenohumeral and acromioclavicular osteoarthritis.  PATIENT SURVEYS:  QuickDASH: 88.6 / 100 = 88.6 %  COGNITION: Overall cognitive status: Within functional limits for tasks assessed     SENSATION: WFL Light touch: Pt reports that it feels duller on her L side  around her shoulder  POSTURE: Slight forward head, rounded shoulders- pt holds L UE in guarded position against body at rest  CERVICAL ROM: all motions appeared WFL, pt has pain at end range extension and end range rotation to L  UPPER EXTREMITY ROM:    Active ROM Right eval Left eval  Shoulder flexion 150 65* active, 120 passive  Shoulder extension    Shoulder abduction 165 60* active, 100 passive  Shoulder adduction    Shoulder internal rotation 60 53  Shoulder external rotation 75 50  Elbow flexion    Elbow extension    Wrist flexion    Wrist extension    Wrist ulnar deviation    Wrist radial deviation    Wrist pronation    Wrist supination    (Blank rows = not tested)  UPPER EXTREMITY MMT: Unable to assess at eval due to significant pain/ROM limitations  MMT Right eval Left eval  Shoulder flexion    Shoulder extension    Shoulder abduction    Shoulder adduction    Shoulder internal rotation    Shoulder external rotation    Middle trapezius    Lower trapezius    Elbow flexion    Elbow extension    Wrist flexion    Wrist extension    Wrist ulnar deviation    Wrist radial deviation    Wrist pronation    Wrist supination    Grip strength (lbs)    (Blank rows = not tested)  SHOULDER SPECIAL TESTS: Rotator cuff assessment: Full can test: positive    PALPATION:  Tender on palpation of L anterior shoulder, L AC joint, Supraspinatus tendon                                                                                                                             TREATMENT  DATE: 07/25/24  ***    PATIENT EDUCATION: Education details: Pt educated on importance of maintaining movement in L shoulder for healing purposes and to prevent frozen shoulder. Pt educated on HEP and provided with full demonstration as well as cues to prevent pushing AROM passed point of severe pain Person educated: Patient Education method: Explanation, Demonstration, Verbal cues, and  Handouts Education comprehension: verbalized understanding and returned demonstration  HOME EXERCISE PROGRAM: Access Code: [email or web address] URL: [email or web address] Date: 05/22/2024 Prepared by: Lonni Gainer  Exercises - Seated Shoulder Flexion Towel Slide at Table Top  - 2 x daily - 7 x weekly - 10 reps - 5 sec hold - Seated Shoulder Abduction Towel Slide at Table Top  - 2 x daily - 7 x weekly - 10 reps - 5 sec hold - Supine Isometric Shoulder Extension with Towel  - 2 x daily - 7 x weekly - 2 sets - 10 reps  ASSESSMENT:  CLINICAL IMPRESSION:   ***     OBJECTIVE IMPAIRMENTS: decreased activity tolerance, decreased mobility, decreased ROM, decreased strength, hypomobility, impaired flexibility, impaired sensation, impaired UE functional use, improper body mechanics, postural dysfunction, and pain.   ACTIVITY LIMITATIONS: carrying, lifting, sleeping, transfers, bed mobility, bathing, toileting, dressing, self feeding, reach over head, hygiene/grooming, locomotion level, and caring for others  PARTICIPATION LIMITATIONS: meal prep, cleaning, laundry, driving, shopping, community activity, and yard work  PERSONAL FACTORS: Age, Profession, Time since onset of injury/illness/exacerbation, and 3+ comorbidities: abdominal hernia, aortic ejection murmur, chronic hepatitis C, depression, DM, GERD, headache, hepatic cirrhosis, HTN, anemia, irregular heart rhythm, palpitations are also affecting patient's functional outcome.   REHAB POTENTIAL: Good  CLINICAL DECISION MAKING: Evolving/moderate complexity  EVALUATION COMPLEXITY: Moderate   GOALS: Goals reviewed with patient? Yes  SHORT TERM GOALS: Target date: 06/06/2024  Patient will be independent in home exercise program to improve strength/mobility for better functional independence with ADLs. Baseline: see HEP above Goal status: MET   LONG TERM GOALS: Target date: 07/11/2024  Patient will decrease Quick DASH score  by > 8 points demonstrating reduced self-reported upper extremity disability. Baseline: 88.6 / 100 = 88.6 % 07/23/24: 59.1/100 = 59.1% Goal status: INITIAL  2.  Patient will improve L shoulder AROM to > 140 degrees of flexion and abduction for improved ability to perform overhead activities. Baseline: flexion- 65 active, 120 passive; abduction- 60 active, 100 passive 07/02/24: Flexion: 54 deg active; Abduction: 48 deg Goal status: NOT PROGRESSING  3.  Pt will report worst shoulder pain of 3/10 on NPS to demonstrated improvements in pain and to allow for improved UE function Baseline: 7.5/10 07/02/24: 7/10 07/23/24: 7/10 Goal status: PROGRESSING  4.  Pt will increase L UE strength to 4+/5 or better to   Baseline: assess at future session 07/02/24: not able to assess due to pain Goal status: INITIAL  5.  Patient (> 22 years old) will complete five times sit to stand test in < 15 seconds indicating an increased LE strength and improved balance. Baseline: 29.81 sec Goal status: INITIAL   6.  Patient will increase Berg Balance score by > 6 points to demonstrate decreased fall risk during functional activities. Baseline: TBD Goal status: INITIAL   7.  Patient will reduce timed up and go to <11 seconds to reduce fall risk and demonstrate improved transfer/gait ability. Baseline: 14.51 sec Goal status: INITIAL  8.  Patient will increase 10 meter walk test to >1.36m/s as to improve gait speed for better community ambulation and to reduce fall risk.  Baseline: 11.69 sec; 0.85 m/s Goal status: INITIAL  9.  Patient will increase six minute walk test distance to >1300 for progression to community ambulator and improve gait ability. Baseline: 1185' Goal status: INITIAL  10.  Pt will improve ABC by at least 13% in order to demonstrate clinically significant improvement in balance confidence. Baseline: 24.38% Goal status: INITIAL     PLAN:  PT FREQUENCY: 2x/week  PT DURATION: 8  weeks  PLANNED INTERVENTIONS: [id number]- PT Re-evaluation, 97750- Physical Performance Testing, 97110-Therapeutic exercises, 97530- Therapeutic activity, [id number]- Neuromuscular re-education, 97535- Self Care, [id number]- Manual therapy, [id number]- Orthotic Initial, [id number]- Orthotic/Prosthetic subsequent, [phone]- Canalith repositioning, [id number]- Splinting, [id number]- Electrical stimulation (manual), [id number]- Ultrasound, [id number]- Traction (mechanical), 20560 (1-2 muscles), 20561 (3+ muscles)- Dry Needling, Patient/Family education, Balance training, Stair training, Taping, Joint mobilization, Spinal mobilization, Vestibular training, DME instructions, Cryotherapy, and Moist heat  PLAN FOR NEXT SESSION:   ***  Ask about ortho visit Review and Progress HEP L shoulder AAROM/AROM activities L shoulder manual therapy/joint mobilizations as tolerated/needed for pain control, isometrics Increase shoulder exercises to provide increased stability   Fonda Simpers, PT, DPT Physical Therapist - [facility]  [facility]  07/25/24, 8:02 AM

## 2024-07-29 ENCOUNTER — Other Ambulatory Visit: Payer: Self-pay | Admitting: *Deleted

## 2024-07-29 DIAGNOSIS — D509 Iron deficiency anemia, unspecified: Secondary | ICD-10-CM

## 2024-07-29 DIAGNOSIS — D61818 Other pancytopenia: Secondary | ICD-10-CM

## 2024-07-30 ENCOUNTER — Encounter: Payer: Self-pay | Admitting: Oncology

## 2024-07-30 ENCOUNTER — Inpatient Hospital Stay (HOSPITAL_BASED_OUTPATIENT_CLINIC_OR_DEPARTMENT_OTHER): Admitting: Oncology

## 2024-07-30 ENCOUNTER — Inpatient Hospital Stay: Attending: Oncology

## 2024-07-30 VITALS — BP 124/72 | HR 78 | Temp 97.7°F | Resp 18 | Wt 175.0 lb

## 2024-07-30 DIAGNOSIS — D509 Iron deficiency anemia, unspecified: Secondary | ICD-10-CM | POA: Diagnosis not present

## 2024-07-30 DIAGNOSIS — D61818 Other pancytopenia: Secondary | ICD-10-CM | POA: Insufficient documentation

## 2024-07-30 DIAGNOSIS — N2889 Other specified disorders of kidney and ureter: Secondary | ICD-10-CM

## 2024-07-30 DIAGNOSIS — K7460 Unspecified cirrhosis of liver: Secondary | ICD-10-CM | POA: Diagnosis not present

## 2024-07-30 LAB — CBC WITH DIFFERENTIAL/PLATELET
Abs Immature Granulocytes: 0.01 K/uL (ref 0.00–0.07)
Basophils Absolute: 0 K/uL (ref 0.0–0.1)
Basophils Relative: 1 %
Eosinophils Absolute: 0.1 K/uL (ref 0.0–0.5)
Eosinophils Relative: 4 %
HCT: 32.7 % — ABNORMAL LOW (ref 36.0–46.0)
Hemoglobin: 11 g/dL — ABNORMAL LOW (ref 12.0–15.0)
Immature Granulocytes: 0 %
Lymphocytes Relative: 25 %
Lymphs Abs: 0.8 K/uL (ref 0.7–4.0)
MCH: 31.1 pg (ref 26.0–34.0)
MCHC: 33.6 g/dL (ref 30.0–36.0)
MCV: 92.4 fL (ref 80.0–100.0)
Monocytes Absolute: 0.3 K/uL (ref 0.1–1.0)
Monocytes Relative: 8 %
Neutro Abs: 2 K/uL (ref 1.7–7.7)
Neutrophils Relative %: 62 %
Platelets: 84 K/uL — ABNORMAL LOW (ref 150–400)
RBC: 3.54 MIL/uL — ABNORMAL LOW (ref 3.87–5.11)
RDW: 14.1 % (ref 11.5–15.5)
WBC: 3.3 K/uL — ABNORMAL LOW (ref 4.0–10.5)
nRBC: 0 % (ref 0.0–0.2)

## 2024-07-30 LAB — IRON AND TIBC
Iron: 85 ug/dL (ref 28–170)
Saturation Ratios: 25 % (ref 10.4–31.8)
TIBC: 336 ug/dL (ref 250–450)
UIBC: 251 ug/dL

## 2024-07-30 LAB — FERRITIN: Ferritin: 66 ng/mL (ref 11–307)

## 2024-07-30 NOTE — Progress Notes (Unsigned)
Patient is doing ok.

## 2024-07-30 NOTE — Progress Notes (Unsigned)
 [facility]  [phone] [phone]  ID: Glorya Bless OB: 1955-12-07  MR#: [id number]  RDW#:[id number]  Patient Care Team: Stanton Lynwood FALCON, MD as PCP - General (Family Medicine) Jacobo Evalene PARAS, MD as Consulting Physician (Oncology)  CHIEF COMPLAINT: Pancytopenia, iron  deficiency anemia.  Left renal mass.  INTERVAL HISTORY: Patient returns to clinic today for repeat laboratory work and routine 70-month evaluation.  She continues to feel well and remains asymptomatic.  She is indicated she is considering returning to work part-time.  She does not complained of any weakness or fatigue.  She has no neurologic complaints.  She denies any recent fevers or illnesses.  She has a good appetite and denies weight loss.  She denies any chest pain, cough, hemoptysis, or shortness of breath.  She denies any abdominal pain. She denies any nausea, vomiting, constipation, or diarrhea.  She has no melena or hematochezia. She has no urinary complaints.  Patient offers no specific complaints today.  REVIEW OF SYSTEMS:   Review of Systems  Constitutional: Negative.  Negative for fever, malaise/fatigue and weight loss.  Respiratory: Negative.  Negative for cough, hemoptysis and shortness of breath.   Cardiovascular: Negative.  Negative for chest pain and leg swelling.  Gastrointestinal: Negative.  Negative for abdominal pain, blood in stool and melena.  Genitourinary: Negative.  Negative for dysuria and hematuria.  Musculoskeletal: Negative.  Negative for back pain.  Skin: Negative.  Negative for rash.  Neurological: Negative.  Negative for dizziness, sensory change, focal weakness, weakness and headaches.  Psychiatric/Behavioral: Negative.  The patient is not nervous/anxious.     As per HPI. Otherwise, a complete review of systems is negative.  PAST MEDICAL HISTORY: Past Medical History:  Diagnosis Date   Abdominal hernia    Allergic genetic state     Aortic ejection murmur    Ascites    Chronic hepatitis C (HCC)    Depression    Diabetes mellitus without complication (HCC)    Dysrhythmia    GERD (gastroesophageal reflux disease)    GIB (gastrointestinal bleeding)    Headache    Hepatic cirrhosis (HCC)    Hepatitis C    Hernia, abdominal    Hyperlipidemia    Hypertension    IDA (iron  deficiency anemia)    Inflamed seborrheic keratosis    Interdigital neuroma of foot    Irregular heart rhythm    Liver disease    Multiple renal cysts    Palpitations    Pancytopenia (HCC)    Portal hypertension with esophageal varices (HCC)    Splenomegaly    UTI (urinary tract infection)     PAST SURGICAL HISTORY: Past Surgical History:  Procedure Laterality Date   ABDOMINAL HYSTERECTOMY     CHOLECYSTECTOMY     COLONOSCOPY WITH PROPOFOL  N/A 12/05/2023   Procedure: COLONOSCOPY WITH PROPOFOL ;  Surgeon: Toledo, Ladell POUR, MD;  Location: [facility];  Service: Gastroenterology;  Laterality: N/A;  DM   ESOPHAGOGASTRODUODENOSCOPY N/A 02/17/2021   Procedure: ESOPHAGOGASTRODUODENOSCOPY (EGD);  Surgeon: Toledo, Ladell POUR, MD;  Location: [facility];  Service: Gastroenterology;  Laterality: N/A;   ESOPHAGOGASTRODUODENOSCOPY (EGD) WITH PROPOFOL  N/A 11/23/2017   Procedure: ESOPHAGOGASTRODUODENOSCOPY (EGD) WITH PROPOFOL ;  Surgeon: Toledo, Ladell POUR, MD;  Location: [facility];  Service: Gastroenterology;  Laterality: N/A;   ESOPHAGOGASTRODUODENOSCOPY (EGD) WITH PROPOFOL  N/A 10/23/2018   Procedure: ESOPHAGOGASTRODUODENOSCOPY (EGD) WITH PROPOFOL ;  Surgeon: Toledo, Ladell POUR, MD;  Location: [facility];  Service: Gastroenterology;  Laterality: N/A;   ESOPHAGOGASTRODUODENOSCOPY (EGD) WITH PROPOFOL  N/A 10/10/2019  Procedure: ESOPHAGOGASTRODUODENOSCOPY (EGD) WITH PROPOFOL ;  Surgeon: Toledo, Ladell POUR, MD;  Location: [facility];  Service: Gastroenterology;  Laterality: N/A;   ESOPHAGOGASTRODUODENOSCOPY (EGD) WITH PROPOFOL  N/A 11/14/2019    Procedure: ESOPHAGOGASTRODUODENOSCOPY (EGD) WITH PROPOFOL ;  Surgeon: Toledo, Ladell POUR, MD;  Location: [facility];  Service: Gastroenterology;  Laterality: N/A;   ESOPHAGOGASTRODUODENOSCOPY (EGD) WITH PROPOFOL  N/A 12/05/2023   Procedure: ESOPHAGOGASTRODUODENOSCOPY (EGD) WITH PROPOFOL ;  Surgeon: Toledo, Ladell POUR, MD;  Location: [facility];  Service: Gastroenterology;  Laterality: N/A;   right elbow surgery     WISDOM TOOTH EXTRACTION      FAMILY HISTORY: Family History  Problem Relation Age of Onset   Diabetes Mother    Heart attack Mother    Hypertension Father    Thyroid disease Sister    Sickle cell anemia Sister    Breast cancer Neg Hx     ADVANCED DIRECTIVES (Y/N):  N  HEALTH MAINTENANCE: Social History   Tobacco Use   Smoking status: Never   Smokeless tobacco: Never  Vaping Use   Vaping status: Never Used  Substance Use Topics   Alcohol use: Not Currently   Drug use: Yes    Types: Marijuana     Colonoscopy:  PAP:  Bone density:  Lipid panel:  Allergies  Allergen Reactions   Latex Rash and Anaphylaxis   Penicillins Anaphylaxis   Amlodipine  Besylate Itching   Metronidazole Other (See Comments)    Patient states that she felt foggy and loopy after taking medication.  Other reaction(s): Other (See Comments)  Patient states that she felt foggy and loopy after taking medication.  Patient states that she felt foggy and loopy after taking medication.  Patient states that she felt foggy and loopy after taking medication.  Patient states that she felt foggy and loopy after taking medication.  Patient states that she felt foggy and loopy after taking medication., Patient states that she felt foggy and loopy after taking medication.  Patient states that she felt foggy and loopy after taking medication. Patient states that she felt foggy and loopy after taking medication.    Patient states that she felt foggy and loopy  after taking medication., Patient states that she felt foggy and loopy after taking medication.    Patient states that she felt foggy and loopy after taking medication.   Acetaminophen  Other (See Comments)    Breast pain  Other reaction(s): Other (See Comments)  Breast pain   Diphenhydramine-Acetaminophen  Other (See Comments)    Made my breast hurt.   Furosemide    Codeine Itching   Enalapril  Itching   Sulfa Antibiotics Anxiety and Other (See Comments)    Reaction unknown.  Reaction unknown.  Reaction unknown.  Reaction unknown.  Reaction unknown.  itching  Reaction unknown., Reaction unknown., Reaction unknown., Reaction unknown.    Current Outpatient Medications  Medication Sig Dispense Refill   bumetanide (BUMEX) 1 MG tablet Take 1 mg by mouth daily.     carvedilol (COREG) 3.125 MG tablet Take 3.125 mg by mouth 2 (two) times daily with a meal.     clobetasol  (TEMOVATE ) 0.05 % external solution Apply 1 Application topically 2 (two) times daily. Apply BID 1-2 drops to aa, scalp 50 mL 2   clobetasol  cream (TEMOVATE ) 0.05 % Apply 1 Application topically 2 (two) times daily. Apply twice a day as needed to affected areas, back of neck and lower back for up to 2 weeks. Avoid applying to face, groin, and axilla. Use as directed. Long-term use can cause  thinning of the skin. 30 g 2   famotidine (PEPCID) 20 MG tablet Take 1 tablet by mouth 2 (two) times daily.     gabapentin (NEURONTIN) 100 MG capsule TAKE 1 CAPSULE BY MOUTH TWICE DAILY FOR 1 WEEK. THEN INCREASE TO 2 CAPSULES 2 TIMES DAILY FOR NUMBNESS     hydrocortisone  2.5 % ointment Apply topically 2 (two) times daily. Apply BID to aa, gluteal crease. 20 g 2   lactulose  (CHRONULAC ) 10 GM/15ML solution Take 45 mLs (30 g total) by mouth 2 (two) times daily. 240 mL 0   metFORMIN  (GLUCOPHAGE ) 1000 MG tablet Take 1,000 mg by mouth 2 (two) times daily.     omeprazole (PRILOSEC) 20 MG capsule      predniSONE (STERAPRED UNI-PAK 21  TAB) 10 MG (21) TBPK tablet Take by mouth as directed.     rifaximin (XIFAXAN) 550 MG TABS tablet Take by mouth.     spironolactone  (ALDACTONE ) 100 MG tablet Take 100 mg by mouth daily.     torsemide (DEMADEX) 10 MG tablet Take 1 tablet by mouth daily.     traZODone  (DESYREL ) 50 MG tablet Take 50 mg by mouth at bedtime.     enalapril  (VASOTEC ) 2.5 MG tablet Take by mouth. (Patient not taking: Reported on 07/30/2024)     No current facility-administered medications for this visit.    OBJECTIVE: Vitals:   07/30/24 1122  BP: 124/72  Pulse: 78  Resp: 18  Temp: 97.7 F (36.5 C)  SpO2: 98%      Body mass index is 30.04 kg/m.    ECOG FS:0 - Asymptomatic  General: Well-developed, well-nourished, no acute distress. Eyes: Pink conjunctiva, anicteric sclera. HEENT: Normocephalic, moist mucous membranes. Lungs: No audible wheezing or coughing. Heart: Regular rate and rhythm. Abdomen: Soft, nontender, no obvious distention. Musculoskeletal: No edema, cyanosis, or clubbing. Neuro: Alert, answering all questions appropriately. Cranial nerves grossly intact. Skin: No rashes or petechiae noted. Psych: Normal affect.  LAB RESULTS:  Lab Results  Component Value Date   NA 138 07/21/2018   K 4.1 07/21/2018   CL 112 (H) 07/21/2018   CO2 18 (L) 07/21/2018   GLUCOSE 111 (H) 07/21/2018   BUN 8 07/21/2018   CREATININE 0.66 07/21/2018   CALCIUM 8.6 (L) 07/21/2018   PROT 7.9 07/20/2018   ALBUMIN 3.6 07/20/2018   AST 48 (H) 07/20/2018   ALT 27 07/20/2018   ALKPHOS 91 07/20/2018   BILITOT 2.5 (H) 07/20/2018   GFRNONAA >60 07/21/2018   GFRAA >60 07/21/2018    Lab Results  Component Value Date   WBC 3.3 (L) 07/30/2024   NEUTROABS 2.0 07/30/2024   HGB 11.0 (L) 07/30/2024   HCT 32.7 (L) 07/30/2024   MCV 92.4 07/30/2024   PLT 84 (L) 07/30/2024   Lab Results  Component Value Date   IRON  85 07/30/2024   TIBC 336 07/30/2024   IRONPCTSAT 25 07/30/2024   Lab Results  Component Value  Date   FERRITIN 66 07/30/2024     STUDIES: No results found.   ASSESSMENT: Pancytopenia, iron  deficiency anemia.  Left renal mass.  PLAN:    Pancytopenia: Multifactorial including cirrhosis and splenomegaly.  MRI on February 06, 2024 did not reveal splenomegaly.  Her white blood cell count has ranged from 2.0-4.0 since January 2019.  Today's results are 3.3.  Platelets have ranged from 52-113 over the same timeframe.  Today's results are 84.  She also has a persistently mild anemia.  She was previously noted to have antineutrophil  antibodies as well as platelet antibodies but both are likely clinically insignificant given the stability of her laboratory work.  No intervention is needed at this time.  Patient does not require bone marrow biopsy.  Return to clinic in April 2026 for repeat laboratory and further evaluation. Iron  deficiency anemia: Chronic and unchanged.  Patient's most recent hemoglobin is 11.0.  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  Her last luminal evaluation was on February 17, 2021.  She last received IV Venofer  on January 18, 2022.  No intervention needed. Left renal mass: MRI results from February 06, 2024 revealed left renal lesion has slightly increased in size from 9 x 8 mm to 10 x 10 mm.  This remains suspicious for an indolent underlying renal cell carcinoma.  Repeat MRI in April 2026.   Cirrhosis: Chronic and unchanged.  Continue to follow-up with [facility] as scheduled.   Patient expressed understanding and was in agreement with this plan. She also understands that She can call clinic at any time with any questions, concerns, or complaints.    Evalene JINNY Reusing, MD   07/31/2024 6:44 AM

## 2024-07-31 ENCOUNTER — Encounter: Payer: Self-pay | Admitting: Oncology

## 2024-08-01 ENCOUNTER — Ambulatory Visit: Attending: Orthopedic Surgery

## 2024-08-01 DIAGNOSIS — G8929 Other chronic pain: Secondary | ICD-10-CM | POA: Insufficient documentation

## 2024-08-01 DIAGNOSIS — M25512 Pain in left shoulder: Secondary | ICD-10-CM | POA: Insufficient documentation

## 2024-08-01 DIAGNOSIS — M25612 Stiffness of left shoulder, not elsewhere classified: Secondary | ICD-10-CM | POA: Diagnosis present

## 2024-08-01 DIAGNOSIS — R262 Difficulty in walking, not elsewhere classified: Secondary | ICD-10-CM | POA: Insufficient documentation

## 2024-08-01 DIAGNOSIS — R2689 Other abnormalities of gait and mobility: Secondary | ICD-10-CM | POA: Insufficient documentation

## 2024-08-01 DIAGNOSIS — M6281 Muscle weakness (generalized): Secondary | ICD-10-CM | POA: Insufficient documentation

## 2024-08-01 NOTE — Therapy (Signed)
 OUTPATIENT PHYSICAL THERAPY SHOULDER TREATMENT   Patient Name: Roberta Lane MRN: [id number] DOB:1956/06/14, 68 y.o., female Today's Date: 08/01/2024  END OF SESSION:   PT End of Session - 08/01/24 1151     Visit Number 12    Number of Visits 16    Date for Recertification  09/17/24    Progress Note Due on Visit 10    PT Start Time 1151    PT Stop Time 1233    PT Time Calculation (min) 42 min    Activity Tolerance Patient tolerated treatment well;Patient limited by pain    Behavior During Therapy WFL for tasks assessed/performed          Past Medical History:  Diagnosis Date   Abdominal hernia    Allergic genetic state    Aortic ejection murmur    Ascites    Chronic hepatitis C (HCC)    Depression    Diabetes mellitus without complication (HCC)    Dysrhythmia    GERD (gastroesophageal reflux disease)    GIB (gastrointestinal bleeding)    Headache    Hepatic cirrhosis (HCC)    Hepatitis C    Hernia, abdominal    Hyperlipidemia    Hypertension    IDA (iron  deficiency anemia)    Inflamed seborrheic keratosis    Interdigital neuroma of foot    Irregular heart rhythm    Liver disease    Multiple renal cysts    Palpitations    Pancytopenia (HCC)    Portal hypertension with esophageal varices (HCC)    Splenomegaly    UTI (urinary tract infection)    Past Surgical History:  Procedure Laterality Date   ABDOMINAL HYSTERECTOMY     CHOLECYSTECTOMY     COLONOSCOPY WITH PROPOFOL  N/A 12/05/2023   Procedure: COLONOSCOPY WITH PROPOFOL ;  Surgeon: Toledo, Ladell POUR, MD;  Location: [facility];  Service: Gastroenterology;  Laterality: N/A;  DM   ESOPHAGOGASTRODUODENOSCOPY N/A 02/17/2021   Procedure: ESOPHAGOGASTRODUODENOSCOPY (EGD);  Surgeon: Toledo, Ladell POUR, MD;  Location: [facility];  Service: Gastroenterology;  Laterality: N/A;   ESOPHAGOGASTRODUODENOSCOPY (EGD) WITH PROPOFOL  N/A 11/23/2017   Procedure: ESOPHAGOGASTRODUODENOSCOPY (EGD) WITH PROPOFOL ;   Surgeon: Toledo, Ladell POUR, MD;  Location: [facility];  Service: Gastroenterology;  Laterality: N/A;   ESOPHAGOGASTRODUODENOSCOPY (EGD) WITH PROPOFOL  N/A 10/23/2018   Procedure: ESOPHAGOGASTRODUODENOSCOPY (EGD) WITH PROPOFOL ;  Surgeon: Toledo, Ladell POUR, MD;  Location: [facility];  Service: Gastroenterology;  Laterality: N/A;   ESOPHAGOGASTRODUODENOSCOPY (EGD) WITH PROPOFOL  N/A 10/10/2019   Procedure: ESOPHAGOGASTRODUODENOSCOPY (EGD) WITH PROPOFOL ;  Surgeon: Toledo, Ladell POUR, MD;  Location: [facility];  Service: Gastroenterology;  Laterality: N/A;   ESOPHAGOGASTRODUODENOSCOPY (EGD) WITH PROPOFOL  N/A 11/14/2019   Procedure: ESOPHAGOGASTRODUODENOSCOPY (EGD) WITH PROPOFOL ;  Surgeon: Toledo, Ladell POUR, MD;  Location: [facility];  Service: Gastroenterology;  Laterality: N/A;   ESOPHAGOGASTRODUODENOSCOPY (EGD) WITH PROPOFOL  N/A 12/05/2023   Procedure: ESOPHAGOGASTRODUODENOSCOPY (EGD) WITH PROPOFOL ;  Surgeon: Toledo, Ladell POUR, MD;  Location: [facility];  Service: Gastroenterology;  Laterality: N/A;   right elbow surgery     WISDOM TOOTH EXTRACTION     Patient Active Problem List   Diagnosis Date Noted   Renal mass, left 04/11/2020   Hypertension 03/18/2020   Hepatic cirrhosis due to chronic hepatitis C infection (HCC) 11/28/2019   Chronic pain of both knees 03/26/2019   Other ascites 03/26/2019   Aortic stenosis, mild 01/07/2019   Iron  deficiency anemia 12/31/2018   Hepatic encephalopathy (HCC) 07/20/2018   Frequent PVCs 06/13/2018   Portal hypertension with esophageal varices (HCC) 06/12/2018  Aortic ejection murmur 05/29/2018   Dizziness 05/29/2018   Hyperlipidemia, mixed 05/29/2018   Palpitations 05/29/2018   Controlled type 2 diabetes mellitus without complication, without long-term current use of insulin  (HCC) 05/17/2018   Abdominal pain, diffuse 12/14/2017   Gastroesophageal reflux disease without esophagitis 12/14/2017   Pancytopenia (HCC) 12/10/2017   GIB  (gastrointestinal bleeding) 11/21/2017    PCP: Lynwood Null, MD  REFERRING PROVIDER: Ozell Flake, MD  REFERRING DIAG: S46.012D (ICD-10-CM) - Strain of muscle(s) and tendon(s) of the rotator cuff of left shoulder, subsequent encounter   THERAPY DIAG:  Muscle weakness (generalized)  Chronic left shoulder pain  Decreased range of motion of shoulder, left  Rationale for Evaluation and Treatment: Rehabilitation  ONSET DATE: October 2024  SUBJECTIVE:                                                                                                                                                                                      SUBJECTIVE STATEMENT:   Pt reports that she is doing well, but might need to miss the next visit due to traveling out of town to visit a friend and to take care of her.     PERTINENT HISTORY: Pt was seen by PCP at [facility] on 04/11/24 with c/c of L shoulder pain that has progressively worsened after a fall pt sustained in October of 2024. Pt states that she lifted her 35 pound suitcase onto the shuttle bus at the airport in [place] and she caught her foot on the step of the shuttle bus and pulled her suitcase and it fell backward onto her shoulder. PCP referred pt to orthopedic surgery, who saw pt on 04/17/24. Per orthopedic surgery note from 6/25: A suspected rotator cuff tear in the left shoulder followed a fall in October. Persistent pain radiating to the forearm and thumb suggests possible nerve involvement. X-ray shows mild AC degenerative changes with slight separation, but no calcific tendonitis or significant glenohumeral degenerative change. Physical examination reveals painful and weak shoulder movements, indicating a rotator cuff tear. MRI was ordered by orthopedic surgeon, with findings suggestive of mild to moderate insertional supraspinatus tendinopathy & probable small interstitial tear to anterior insertion without a full-thickness  tear.  PMH significant for abdominal hernia, aortic ejection murmur, chronic hepatitis C, depression, DM, GERD, headache, hepatic cirrhosis, HTN, anemia, irregular heart rhythm, palpitations  Pt states that the orthopedic surgeon wants her to do 4 weeks of PT and if the pain does not improve then they will do surgery.   PAIN:  Are you having pain? Yes: NPRS scale: 7/10 currently Pain location: Anterior shoulder, into lateral deltoid Pain description: aching Aggravating factors:  Reaching overhead, behind her, forward Relieving factors: Biofreeze helped initially  PRECAUTIONS: None  RED FLAGS: None   WEIGHT BEARING RESTRICTIONS: No  FALLS:  Has patient fallen in last 6 months? 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  DIAGNOSTIC FINDINGS:  MRI Shoulder Left WO Contrast (from 04/21/24):  FINDINGS: There is mild to moderate insertional tendinopathy of the supraspinatus tendon and a small interstitial tear to the anterior insertion. No retraction or full-thickness tear. Mild impingement change deep to the infraspinatus insertion. The subscapularis and teres minor tendons are unremarkable. There is mild-to-moderate tendinopathy of the intra-articular biceps tendon greater distally. Fibers are intact.   There is no labral tear. Mild glenohumeral and acromioclavicular  osteoarthritis with joint space narrowing and mild degenerative edema.   IMPRESSION: Mild to moderate insertional tendinopathy of the supraspinatus tendon and a probable small interstitial partial tear to the anterior insertion without retraction or full-thickness tear.   Mild-to-moderate tendinopathy of the intra-articular biceps tendon.   Mild glenohumeral and acromioclavicular osteoarthritis.  PATIENT SURVEYS:  QuickDASH: 88.6 / 100 = 88.6 %  COGNITION: Overall cognitive status: Within functional limits for tasks assessed     SENSATION: WFL Light touch: Pt reports that it feels duller on her L side around her shoulder  POSTURE: Slight forward head, rounded shoulders- pt holds L UE in guarded position against body at rest  CERVICAL ROM: all motions appeared WFL, pt has pain at end range extension and end range rotation to L  UPPER EXTREMITY ROM:    Active ROM Right eval Left eval  Shoulder flexion 150 65* active, 120 passive  Shoulder extension    Shoulder abduction 165 60* active, 100 passive  Shoulder adduction    Shoulder internal rotation 60 53  Shoulder external rotation 75 50  Elbow flexion    Elbow extension    Wrist flexion    Wrist extension    Wrist ulnar deviation    Wrist radial deviation    Wrist pronation    Wrist supination    (Blank rows = not tested)  UPPER EXTREMITY MMT: Unable to assess at eval due to significant pain/ROM limitations  MMT Right eval Left eval  Shoulder flexion    Shoulder extension    Shoulder abduction    Shoulder adduction    Shoulder internal rotation    Shoulder external rotation    Middle trapezius    Lower trapezius    Elbow flexion    Elbow extension    Wrist flexion    Wrist extension    Wrist ulnar deviation    Wrist radial deviation    Wrist pronation    Wrist supination    Grip strength (lbs)    (Blank rows = not tested)  SHOULDER SPECIAL TESTS: Rotator cuff assessment: Full can test: positive     PALPATION:  Tender on palpation of  L anterior shoulder, L AC joint, Supraspinatus tendon                                                                                                                             TREATMENT DATE: 08/01/24   Manual:    STM with TP release technique to L biceps, L UT, and L supraspinatus for increased tissue extensibility and pain modulation    Supine shoulder mobilizations, PA's, inferior glides, grades II-III, for pain modulation and improved ROM of the shoulder   Supine STM to cervical region to increase extensibility of the paraspinals Supine suboccipital release technique to decrease cervicalgia Supine manual traction performed in order to increase joint space in cervical region for pain relief Supine cervical upglides/downglides, 30 sec bouts Supine UT/Levator stretch, 30 sec bouts to increase tissue extensibility of the cervical region       TE- To improve strength, endurance, mobility, and function of specific targeted muscle groups or improve joint range of motion or improve muscle flexibility   Standing rhythmic stabilization, 30 sec bouts in each direction  Standing wall LUE slides in flexion, 2x10  Standing wall LUE slides in scaption, 2x10  Standing scapular retractions with 7.5# at cable column, 2x10   Seated AAROM with use of PVC pipe, forward flexion, 2x15 Seated AAROM with use of PVC pipe, incline chest press, 2x15    PATIENT EDUCATION: Education details: Pt educated on importance of maintaining movement in L shoulder for healing purposes and to prevent frozen shoulder. Pt educated on HEP and provided with full demonstration as well as cues to prevent pushing AROM passed point of severe pain Person educated: Patient Education method: Explanation, Demonstration, Verbal cues, and Handouts Education comprehension: verbalized understanding and returned demonstration  HOME EXERCISE PROGRAM: Access Code: [email or web address] URL:  [email or web address] Date: 05/22/2024 Prepared by: Lonni Gainer  Exercises - Seated Shoulder Flexion Towel Slide at Table Top  - 2 x daily - 7 x weekly - 10 reps - 5 sec hold - Seated Shoulder Abduction Towel Slide at Table Top  - 2 x daily - 7 x weekly - 10 reps - 5 sec hold - Supine Isometric Shoulder Extension with Towel  - 2 x daily - 7 x weekly - 2 sets - 10 reps  ASSESSMENT:  CLINICAL IMPRESSION:   Pt performed well and put forth great effort throughout the session.  Pt still with some palpable TP's noted in the biceps region and extensive time was spent trying to alleviate this for pain modulation.  Pt ultimately is demonstrating improved ROM and strength as well.  Pt will continue to benefit from strengthening of the shoulder moving forward and also to assess balance deficits once new order is received.   Pt will continue to benefit from skilled therapy to address remaining deficits in order to improve overall QoL and return to PLOF.        OBJECTIVE IMPAIRMENTS: decreased activity tolerance, decreased mobility, decreased ROM, decreased strength,  hypomobility, impaired flexibility, impaired sensation, impaired UE functional use, improper body mechanics, postural dysfunction, and pain.   ACTIVITY LIMITATIONS: carrying, lifting, sleeping, transfers, bed mobility, bathing, toileting, dressing, self feeding, reach over head, hygiene/grooming, locomotion level, and caring for others  PARTICIPATION LIMITATIONS: meal prep, cleaning, laundry, driving, shopping, community activity, and yard work  PERSONAL FACTORS: Age, Profession, Time since onset of injury/illness/exacerbation, and 3+ comorbidities: abdominal hernia, aortic ejection murmur, chronic hepatitis C, depression, DM, GERD, headache, hepatic cirrhosis, HTN, anemia, irregular heart rhythm, palpitations are also affecting patient's functional outcome.   REHAB POTENTIAL: Good  CLINICAL DECISION MAKING:  Evolving/moderate complexity  EVALUATION COMPLEXITY: Moderate   GOALS: Goals reviewed with patient? Yes  SHORT TERM GOALS: Target date: 06/06/2024  Patient will be independent in home exercise program to improve strength/mobility for better functional independence with ADLs. Baseline: see HEP above Goal status: MET   LONG TERM GOALS: Target date: 07/11/2024  Patient will decrease Quick DASH score by > 8 points demonstrating reduced self-reported upper extremity disability. Baseline: 88.6 / 100 = 88.6 % 07/23/24: 59.1/100 = 59.1% Goal status: INITIAL  2.  Patient will improve L shoulder AROM to > 140 degrees of flexion and abduction for improved ability to perform overhead activities. Baseline: flexion- 65 active, 120 passive; abduction- 60 active, 100 passive 07/02/24: Flexion: 54 deg active; Abduction: 48 deg Goal status: NOT PROGRESSING  3.  Pt will report worst shoulder pain of 3/10 on NPS to demonstrated improvements in pain and to allow for improved UE function Baseline: 7.5/10 07/02/24: 7/10 07/23/24: 7/10 Goal status: PROGRESSING  4.  Pt will increase L UE strength to 4+/5 or better to   Baseline: assess at future session 07/02/24: not able to assess due to pain Goal status: INITIAL  5.  Patient (> 20 years old) will complete five times sit to stand test in < 15 seconds indicating an increased LE strength and improved balance. Baseline: 29.81 sec Goal status: INITIAL   6.  Patient will increase Berg Balance score by > 6 points to demonstrate decreased fall risk during functional activities. Baseline: TBD Goal status: INITIAL   7.  Patient will reduce timed up and go to <11 seconds to reduce fall risk and demonstrate improved transfer/gait ability. Baseline: 14.51 sec Goal status: INITIAL  8.  Patient will increase 10 meter walk test to >1.53m/s as to improve gait speed for better community ambulation and to reduce fall risk. Baseline: 11.69 sec; 0.85 m/s Goal status:  INITIAL  9.  Patient will increase six minute walk test distance to >1300 for progression to community ambulator and improve gait ability. Baseline: 1185' Goal status: INITIAL  10.  Pt will improve ABC by at least 13% in order to demonstrate clinically significant improvement in balance confidence. Baseline: 24.38% Goal status: INITIAL     PLAN:  PT FREQUENCY: 2x/week  PT DURATION: 8 weeks  PLANNED INTERVENTIONS: [id number]- PT Re-evaluation, 97750- Physical Performance Testing, 97110-Therapeutic exercises, 97530- Therapeutic activity, [id number]- Neuromuscular re-education, 97535- Self Care, [id number]- Manual therapy, [id number]- Orthotic Initial, [id number]- Orthotic/Prosthetic subsequent, [id number]- Canalith repositioning, [id number]- Splinting, [id number]- Electrical stimulation (manual), [id number]- Ultrasound, [id number]- Traction (mechanical), [phone] (1-2 muscles), [id number] (3+ muscles)- Dry Needling, Patient/Family education, Balance training, Stair training, Taping, Joint mobilization, Spinal mobilization, Vestibular training, DME instructions, Cryotherapy, and Moist heat  PLAN FOR NEXT SESSION:   Ask about ortho visit Review and Progress HEP L shoulder AAROM/AROM activities L shoulder manual therapy/joint mobilizations as tolerated/needed for pain control, isometrics Increase shoulder exercises to provide  increased stability   Fonda Simpers, PT, DPT Physical Therapist - [place]  [facility]  08/01/24, 11:51 AM

## 2024-08-06 ENCOUNTER — Ambulatory Visit

## 2024-08-06 DIAGNOSIS — G8929 Other chronic pain: Secondary | ICD-10-CM

## 2024-08-06 DIAGNOSIS — M6281 Muscle weakness (generalized): Secondary | ICD-10-CM

## 2024-08-06 DIAGNOSIS — M25612 Stiffness of left shoulder, not elsewhere classified: Secondary | ICD-10-CM

## 2024-08-06 NOTE — Therapy (Signed)
 OUTPATIENT PHYSICAL THERAPY SHOULDER TREATMENT   Patient Name: Roberta Lane MRN: [id number] DOB:02-Dec-1955, 68 y.o., female Today's Date: 08/06/2024  END OF SESSION:   PT End of Session - 08/06/24 1151     Visit Number 13    Number of Visits 16    Date for Recertification  09/17/24    Progress Note Due on Visit 10    PT Start Time 1150    PT Stop Time 1230    PT Time Calculation (min) 40 min    Activity Tolerance Patient tolerated treatment well;Patient limited by pain    Behavior During Therapy WFL for tasks assessed/performed           Past Medical History:  Diagnosis Date   Abdominal hernia    Allergic genetic state    Aortic ejection murmur    Ascites    Chronic hepatitis C (HCC)    Depression    Diabetes mellitus without complication (HCC)    Dysrhythmia    GERD (gastroesophageal reflux disease)    GIB (gastrointestinal bleeding)    Headache    Hepatic cirrhosis (HCC)    Hepatitis C    Hernia, abdominal    Hyperlipidemia    Hypertension    IDA (iron  deficiency anemia)    Inflamed seborrheic keratosis    Interdigital neuroma of foot    Irregular heart rhythm    Liver disease    Multiple renal cysts    Palpitations    Pancytopenia (HCC)    Portal hypertension with esophageal varices (HCC)    Splenomegaly    UTI (urinary tract infection)    Past Surgical History:  Procedure Laterality Date   ABDOMINAL HYSTERECTOMY     CHOLECYSTECTOMY     COLONOSCOPY WITH PROPOFOL  N/A 12/05/2023   Procedure: COLONOSCOPY WITH PROPOFOL ;  Surgeon: Toledo, Ladell POUR, MD;  Location: [facility];  Service: Gastroenterology;  Laterality: N/A;  DM   ESOPHAGOGASTRODUODENOSCOPY N/A 02/17/2021   Procedure: ESOPHAGOGASTRODUODENOSCOPY (EGD);  Surgeon: Toledo, Ladell POUR, MD;  Location: [facility];  Service: Gastroenterology;  Laterality: N/A;   ESOPHAGOGASTRODUODENOSCOPY (EGD) WITH PROPOFOL  N/A 11/23/2017   Procedure: ESOPHAGOGASTRODUODENOSCOPY (EGD) WITH PROPOFOL ;   Surgeon: Toledo, Ladell POUR, MD;  Location: [facility];  Service: Gastroenterology;  Laterality: N/A;   ESOPHAGOGASTRODUODENOSCOPY (EGD) WITH PROPOFOL  N/A 10/23/2018   Procedure: ESOPHAGOGASTRODUODENOSCOPY (EGD) WITH PROPOFOL ;  Surgeon: Toledo, Ladell POUR, MD;  Location: [facility];  Service: Gastroenterology;  Laterality: N/A;   ESOPHAGOGASTRODUODENOSCOPY (EGD) WITH PROPOFOL  N/A 10/10/2019   Procedure: ESOPHAGOGASTRODUODENOSCOPY (EGD) WITH PROPOFOL ;  Surgeon: Toledo, Ladell POUR, MD;  Location: [facility];  Service: Gastroenterology;  Laterality: N/A;   ESOPHAGOGASTRODUODENOSCOPY (EGD) WITH PROPOFOL  N/A 11/14/2019   Procedure: ESOPHAGOGASTRODUODENOSCOPY (EGD) WITH PROPOFOL ;  Surgeon: Toledo, Ladell POUR, MD;  Location: [facility];  Service: Gastroenterology;  Laterality: N/A;   ESOPHAGOGASTRODUODENOSCOPY (EGD) WITH PROPOFOL  N/A 12/05/2023   Procedure: ESOPHAGOGASTRODUODENOSCOPY (EGD) WITH PROPOFOL ;  Surgeon: Toledo, Ladell POUR, MD;  Location: [facility];  Service: Gastroenterology;  Laterality: N/A;   right elbow surgery     WISDOM TOOTH EXTRACTION     Patient Active Problem List   Diagnosis Date Noted   Renal mass, left 04/11/2020   Hypertension 03/18/2020   Hepatic cirrhosis due to chronic hepatitis C infection (HCC) 11/28/2019   Chronic pain of both knees 03/26/2019   Other ascites 03/26/2019   Aortic stenosis, mild 01/07/2019   Iron  deficiency anemia 12/31/2018   Hepatic encephalopathy (HCC) 07/20/2018   Frequent PVCs 06/13/2018   Portal hypertension with esophageal varices (HCC)  06/12/2018   Aortic ejection murmur 05/29/2018   Dizziness 05/29/2018   Hyperlipidemia, mixed 05/29/2018   Palpitations 05/29/2018   Controlled type 2 diabetes mellitus without complication, without long-term current use of insulin  (HCC) 05/17/2018   Abdominal pain, diffuse 12/14/2017   Gastroesophageal reflux disease without esophagitis 12/14/2017   Pancytopenia (HCC) 12/10/2017   GIB  (gastrointestinal bleeding) 11/21/2017    PCP: Lynwood Null, MD  REFERRING PROVIDER: Ozell Flake, MD  REFERRING DIAG: S46.012D (ICD-10-CM) - Strain of muscle(s) and tendon(s) of the rotator cuff of left shoulder, subsequent encounter   THERAPY DIAG:  Muscle weakness (generalized)  Chronic left shoulder pain  Decreased range of motion of shoulder, left  Rationale for Evaluation and Treatment: Rehabilitation  ONSET DATE: October 2024  SUBJECTIVE:                                                                                                                                                                                      SUBJECTIVE STATEMENT:   Pt reports she won't be able to make it next Thursday due to having dental surgery.  Pt also reports that depending on the situation is that she may not be able to come to the following Tuesday.  Pt reports that her pain is 8-9/10 today in the shoulder.      PERTINENT HISTORY: Pt was seen by PCP at [facility] on 04/11/24 with c/c of L shoulder pain that has progressively worsened after a fall pt sustained in October of 2024. Pt states that she lifted her 35 pound suitcase onto the shuttle bus at the airport in [place] and she caught her foot on the step of the shuttle bus and pulled her suitcase and it fell backward onto her shoulder. PCP referred pt to orthopedic surgery, who saw pt on 04/17/24. Per orthopedic surgery note from 6/25: A suspected rotator cuff tear in the left shoulder followed a fall in October. Persistent pain radiating to the forearm and thumb suggests possible nerve involvement. X-ray shows mild AC degenerative changes with slight separation, but no calcific tendonitis or significant glenohumeral degenerative change. Physical examination reveals painful and weak shoulder movements, indicating a rotator cuff tear. MRI was ordered by orthopedic surgeon, with findings suggestive of mild to moderate insertional  supraspinatus tendinopathy & probable small interstitial tear to anterior insertion without a full-thickness tear.  PMH significant for abdominal hernia, aortic ejection murmur, chronic hepatitis C, depression, DM, GERD, headache, hepatic cirrhosis, HTN, anemia, irregular heart rhythm, palpitations  Pt states that the orthopedic surgeon wants her to do 4 weeks of PT and if the pain does not improve then they will do surgery.  PAIN:  Are you having pain? Yes: NPRS scale: 7/10 currently Pain location: Anterior shoulder, into lateral deltoid Pain description: aching Aggravating factors: Reaching overhead, behind her, forward Relieving factors: Biofreeze helped initially  PRECAUTIONS: None  RED FLAGS: None   WEIGHT BEARING RESTRICTIONS: No  FALLS:  Has patient fallen in last 6 months? 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  DIAGNOSTIC FINDINGS:  MRI Shoulder Left WO Contrast (from 04/21/24):  FINDINGS: There is mild to moderate insertional tendinopathy of the supraspinatus tendon and a small interstitial tear to the anterior insertion. No retraction or full-thickness tear. Mild impingement change deep to the infraspinatus insertion. The subscapularis and teres minor tendons are unremarkable. There is mild-to-moderate tendinopathy of the intra-articular biceps  tendon greater distally. Fibers are intact.   There is no labral tear. Mild glenohumeral and acromioclavicular osteoarthritis with joint space narrowing and mild degenerative edema.   IMPRESSION: Mild to moderate insertional tendinopathy of the supraspinatus tendon and a probable small interstitial partial tear to the anterior insertion without retraction or full-thickness tear.   Mild-to-moderate tendinopathy of the intra-articular biceps tendon.   Mild glenohumeral and acromioclavicular osteoarthritis.  PATIENT SURVEYS:  QuickDASH: 88.6 / 100 = 88.6 %  COGNITION: Overall cognitive status: Within functional limits for tasks assessed     SENSATION: WFL Light touch: Pt reports that it feels duller on her L side around her shoulder  POSTURE: Slight forward head, rounded shoulders- pt holds L UE in guarded position against body at rest  CERVICAL ROM: all motions appeared WFL, pt has pain at end range extension and end range rotation to L  UPPER EXTREMITY ROM:    Active ROM Right eval Left eval  Shoulder flexion 150 65* active, 120 passive  Shoulder extension    Shoulder abduction 165 60* active, 100 passive  Shoulder adduction    Shoulder internal rotation 60 53  Shoulder external rotation 75 50  Elbow flexion    Elbow extension    Wrist flexion    Wrist extension    Wrist ulnar deviation    Wrist radial deviation    Wrist pronation    Wrist supination    (Blank rows = not tested)  UPPER EXTREMITY MMT: Unable to assess at eval due to significant pain/ROM limitations  MMT Right eval Left eval  Shoulder flexion    Shoulder extension    Shoulder abduction    Shoulder adduction    Shoulder internal rotation    Shoulder external rotation    Middle trapezius    Lower trapezius    Elbow flexion    Elbow extension    Wrist flexion    Wrist extension    Wrist ulnar deviation    Wrist radial deviation    Wrist pronation    Wrist supination    Grip strength  (lbs)    (Blank rows =  not tested)  SHOULDER SPECIAL TESTS: Rotator cuff assessment: Full can test: positive    PALPATION:  Tender on palpation of L anterior shoulder, L AC joint, Supraspinatus tendon                                                                                                                             TREATMENT DATE: 08/06/24  Manual:    Moist heat pack applied to the posterior and superior portion of the shoulder for pain modulation during the manual therapy portion of therapy.    STM with TP release technique to L biceps, L UT, and L supraspinatus for increased tissue extensibility and pain modulation    Supine shoulder mobilizations, PA's, inferior glides, grades II-III, for pain modulation and improved ROM of the shoulder   Supine UT/Levator stretch, 30 sec bouts to increase tissue extensibility of the cervical region       TE- To improve strength, endurance, mobility, and function of specific targeted muscle groups or improve joint range of motion or improve muscle flexibility   Supine rhythmic stabilization, 30 sec bouts focusing more proximal and working more distally, getting right above the elbow, x4  Standing scapular retractions, 17.5#, 2x10  Standing pallof press, 12.5#, 2x10 each direction  Seated lat pull downs, 12.5# each side (25# total), 2x10      PATIENT EDUCATION: Education details: Pt educated on importance of maintaining movement in L shoulder for healing purposes and to prevent frozen shoulder. Pt educated on HEP and provided with full demonstration as well as cues to prevent pushing AROM passed point of severe pain Person educated: Patient Education method: Explanation, Demonstration, Verbal cues, and Handouts Education comprehension: verbalized understanding and returned demonstration  HOME EXERCISE PROGRAM: Access Code: [email or web address] URL: [email or web address] Date: 05/22/2024 Prepared by: Lonni Gainer  Exercises - Seated Shoulder Flexion Towel Slide at Table Top  - 2 x daily - 7 x weekly - 10 reps - 5 sec hold - Seated Shoulder Abduction Towel Slide at Table Top  - 2 x daily - 7 x weekly - 10 reps - 5 sec hold - Supine Isometric Shoulder Extension with Towel  - 2 x daily - 7 x weekly - 2 sets - 10 reps  ASSESSMENT:  CLINICAL IMPRESSION:   Pt continues to make improvement in strength and ROM with skilled therapy.  Pt ultimately still lacks the full strength necessary for full ROM without pain.  Pt was given manual therapy to address some of the pain and pt noted the pain to be decreased to 4/10 upon leaving the clinic.  Pt noted the MHP felt good on the shoulder and appreciated the warmth applied while therapist was performing manual therapy approach.   Pt will continue to benefit from skilled therapy to address remaining deficits in order to improve overall QoL and return to PLOF.         OBJECTIVE IMPAIRMENTS: decreased activity tolerance, decreased mobility, decreased ROM, decreased strength,  hypomobility, impaired flexibility, impaired sensation, impaired UE functional use, improper body mechanics, postural dysfunction, and pain.   ACTIVITY LIMITATIONS: carrying, lifting, sleeping, transfers, bed mobility, bathing, toileting, dressing, self feeding, reach over head, hygiene/grooming, locomotion level, and caring for others  PARTICIPATION LIMITATIONS: meal prep, cleaning, laundry, driving, shopping, community activity, and yard work  PERSONAL FACTORS: Age, Profession, Time since onset of injury/illness/exacerbation, and 3+ comorbidities: abdominal hernia, aortic ejection murmur, chronic hepatitis C, depression, DM, GERD, headache, hepatic cirrhosis, HTN, anemia, irregular heart rhythm, palpitations are also affecting patient's functional outcome.   REHAB POTENTIAL: Good  CLINICAL DECISION MAKING: Evolving/moderate complexity  EVALUATION COMPLEXITY: Moderate   GOALS: Goals  reviewed with patient? Yes  SHORT TERM GOALS: Target date: 06/06/2024  Patient will be independent in home exercise program to improve strength/mobility for better functional independence with ADLs. Baseline: see HEP above Goal status: MET   LONG TERM GOALS: Target date: 07/11/2024  Patient will decrease Quick DASH score by > 8 points demonstrating reduced self-reported upper extremity disability. Baseline: 88.6 / 100 = 88.6 % 07/23/24: 59.1/100 = 59.1% Goal status: INITIAL  2.  Patient will improve L shoulder AROM to > 140 degrees of flexion and abduction for improved ability to perform overhead activities. Baseline: flexion- 65 active, 120 passive; abduction- 60 active, 100 passive 07/02/24: Flexion: 54 deg active; Abduction: 48 deg Goal status: NOT PROGRESSING  3.  Pt will report worst shoulder pain of 3/10 on NPS to demonstrated improvements in pain and to allow for improved UE function Baseline: 7.5/10 07/02/24: 7/10 07/23/24: 7/10 Goal status: PROGRESSING  4.  Pt will increase L UE strength to 4+/5 or better to   Baseline: assess at future session 07/02/24: not able to assess due to pain Goal status: INITIAL  5.  Patient (> 46 years old) will complete five times sit to stand test in < 15 seconds indicating an increased LE strength and improved balance. Baseline: 29.81 sec Goal status: INITIAL   6.  Patient will increase Berg Balance score by > 6 points to demonstrate decreased fall risk during functional activities. Baseline: TBD Goal status: INITIAL   7.  Patient will reduce timed up and go to <11 seconds to reduce fall risk and demonstrate improved transfer/gait ability. Baseline: 14.51 sec Goal status: INITIAL  8.  Patient will increase 10 meter walk test to >1.45m/s as to improve gait speed for better community ambulation and to reduce fall risk. Baseline: 11.69 sec; 0.85 m/s Goal status: INITIAL  9.  Patient will increase six minute walk test distance to >1300 for  progression to community ambulator and improve gait ability. Baseline: 1185' Goal status: INITIAL  10.  Pt will improve ABC by at least 13% in order to demonstrate clinically significant improvement in balance confidence. Baseline: 24.38% Goal status: INITIAL     PLAN:  PT FREQUENCY: 2x/week  PT DURATION: 8 weeks  PLANNED INTERVENTIONS: [id number]- PT Re-evaluation, 97750- Physical Performance Testing, 97110-Therapeutic exercises, 97530- Therapeutic activity, [id number]- Neuromuscular re-education, 97535- Self Care, [id number]- Manual therapy, [id number]- Orthotic Initial, [id number]- Orthotic/Prosthetic subsequent, [id number]- Canalith repositioning, [id number]- Splinting, [id number]- Electrical stimulation (manual), [id number]- Ultrasound, [id number]- Traction (mechanical), [phone] (1-2 muscles), [id number] (3+ muscles)- Dry Needling, Patient/Family education, Balance training, Stair training, Taping, Joint mobilization, Spinal mobilization, Vestibular training, DME instructions, Cryotherapy, and Moist heat  PLAN FOR NEXT SESSION:   Review and Progress HEP L shoulder AAROM/AROM activities L shoulder manual therapy/joint mobilizations as tolerated/needed for pain control, isometrics Increase shoulder exercises to provide increased stability  Fonda Simpers, PT, DPT Physical Therapist - [facility]  08/06/24, 3:42 PM

## 2024-08-08 ENCOUNTER — Ambulatory Visit: Admitting: Physical Therapy

## 2024-08-13 ENCOUNTER — Encounter

## 2024-08-15 ENCOUNTER — Encounter: Admitting: Physical Therapy

## 2024-08-20 ENCOUNTER — Ambulatory Visit

## 2024-08-20 DIAGNOSIS — M6281 Muscle weakness (generalized): Secondary | ICD-10-CM | POA: Diagnosis not present

## 2024-08-20 DIAGNOSIS — G8929 Other chronic pain: Secondary | ICD-10-CM

## 2024-08-20 DIAGNOSIS — M25612 Stiffness of left shoulder, not elsewhere classified: Secondary | ICD-10-CM

## 2024-08-20 NOTE — Therapy (Signed)
 OUTPATIENT PHYSICAL THERAPY SHOULDER TREATMENT   Patient Name: Roberta Lane MRN: [id number] DOB:1956-04-21, 68 y.o., female Today's Date: 08/20/2024  END OF SESSION:   PT End of Session - 08/20/24 1147     Visit Number 14    Number of Visits 16    Date for Recertification  09/17/24    Progress Note Due on Visit 10    PT Start Time 1147    PT Stop Time 1230    PT Time Calculation (min) 43 min    Activity Tolerance Patient tolerated treatment well;Patient limited by pain    Behavior During Therapy [facility] for tasks assessed/performed          Past Medical History:  Diagnosis Date   Abdominal hernia    Allergic genetic state    Aortic ejection murmur    Ascites    Chronic hepatitis C (HCC)    Depression    Diabetes mellitus without complication (HCC)    Dysrhythmia    GERD (gastroesophageal reflux disease)    GIB (gastrointestinal bleeding)    Headache    Hepatic cirrhosis (HCC)    Hepatitis C    Hernia, abdominal    Hyperlipidemia    Hypertension    IDA (iron  deficiency anemia)    Inflamed seborrheic keratosis    Interdigital neuroma of foot    Irregular heart rhythm    Liver disease    Multiple renal cysts    Palpitations    Pancytopenia (HCC)    Portal hypertension with esophageal varices (HCC)    Splenomegaly    UTI (urinary tract infection)    Past Surgical History:  Procedure Laterality Date   ABDOMINAL HYSTERECTOMY     CHOLECYSTECTOMY     COLONOSCOPY WITH PROPOFOL  N/A 12/05/2023   Procedure: COLONOSCOPY WITH PROPOFOL ;  Surgeon: Toledo, Ladell POUR, MD;  Location: [facility];  Service: Gastroenterology;  Laterality: N/A;  DM   ESOPHAGOGASTRODUODENOSCOPY N/A 02/17/2021   Procedure: ESOPHAGOGASTRODUODENOSCOPY (EGD);  Surgeon: Toledo, Ladell POUR, MD;  Location: [facility];  Service: Gastroenterology;  Laterality: N/A;   ESOPHAGOGASTRODUODENOSCOPY (EGD) WITH PROPOFOL  N/A 11/23/2017   Procedure: ESOPHAGOGASTRODUODENOSCOPY (EGD) WITH PROPOFOL ;   Surgeon: Toledo, Ladell POUR, MD;  Location: [facility];  Service: Gastroenterology;  Laterality: N/A;   ESOPHAGOGASTRODUODENOSCOPY (EGD) WITH PROPOFOL  N/A 10/23/2018   Procedure: ESOPHAGOGASTRODUODENOSCOPY (EGD) WITH PROPOFOL ;  Surgeon: Toledo, Ladell POUR, MD;  Location: [facility];  Service: Gastroenterology;  Laterality: N/A;   ESOPHAGOGASTRODUODENOSCOPY (EGD) WITH PROPOFOL  N/A 10/10/2019   Procedure: ESOPHAGOGASTRODUODENOSCOPY (EGD) WITH PROPOFOL ;  Surgeon: Toledo, Ladell POUR, MD;  Location: [facility];  Service: Gastroenterology;  Laterality: N/A;   ESOPHAGOGASTRODUODENOSCOPY (EGD) WITH PROPOFOL  N/A 11/14/2019   Procedure: ESOPHAGOGASTRODUODENOSCOPY (EGD) WITH PROPOFOL ;  Surgeon: Toledo, Ladell POUR, MD;  Location: [facility];  Service: Gastroenterology;  Laterality: N/A;   ESOPHAGOGASTRODUODENOSCOPY (EGD) WITH PROPOFOL  N/A 12/05/2023   Procedure: ESOPHAGOGASTRODUODENOSCOPY (EGD) WITH PROPOFOL ;  Surgeon: Toledo, Ladell POUR, MD;  Location: [facility];  Service: Gastroenterology;  Laterality: N/A;   right elbow surgery     WISDOM TOOTH EXTRACTION     Patient Active Problem List   Diagnosis Date Noted   Renal mass, left 04/11/2020   Hypertension 03/18/2020   Hepatic cirrhosis due to chronic hepatitis C infection (HCC) 11/28/2019   Chronic pain of both knees 03/26/2019   Other ascites 03/26/2019   Aortic stenosis, mild 01/07/2019   Iron  deficiency anemia 12/31/2018   Hepatic encephalopathy (HCC) 07/20/2018   Frequent PVCs 06/13/2018   Portal hypertension with esophageal varices (HCC) 06/12/2018  Aortic ejection murmur 05/29/2018   Dizziness 05/29/2018   Hyperlipidemia, mixed 05/29/2018   Palpitations 05/29/2018   Controlled type 2 diabetes mellitus without complication, without long-term current use of insulin  (HCC) 05/17/2018   Abdominal pain, diffuse 12/14/2017   Gastroesophageal reflux disease without esophagitis 12/14/2017   Pancytopenia (HCC) 12/10/2017   GIB  (gastrointestinal bleeding) 11/21/2017    PCP: Lynwood Null, MD  REFERRING PROVIDER: Ozell Flake, MD  REFERRING DIAG: S46.012D (ICD-10-CM) - Strain of muscle(s) and tendon(s) of the rotator cuff of left shoulder, subsequent encounter   THERAPY DIAG:  Muscle weakness (generalized)  Chronic left shoulder pain  Decreased range of motion of shoulder, left  Rationale for Evaluation and Treatment: Rehabilitation  ONSET DATE: October 2024  SUBJECTIVE:                                                                                                                                                                                      SUBJECTIVE STATEMENT:   Pt reports she got her report from her neurologist.  Pt notes that she has 50% of neuropathy in her LE's and that is what is contributing to her imbalance.  Pt notes that her MD stated that it's not something that will be resolved 2-3 weeks, but something that will be a longer term issue.    PERTINENT HISTORY: Pt was seen by PCP at [facility] on 04/11/24 with c/c of L shoulder pain that has progressively worsened after a fall pt sustained in October of 2024. Pt states that she lifted her 35 pound suitcase onto the shuttle bus at the airport in [place] and she caught her foot on the step of the shuttle bus and pulled her suitcase and it fell backward onto her shoulder. PCP referred pt to orthopedic surgery, who saw pt on 04/17/24. Per orthopedic surgery note from 6/25: A suspected rotator cuff tear in the left shoulder followed a fall in October. Persistent pain radiating to the forearm and thumb suggests possible nerve involvement. X-ray shows mild AC degenerative changes with slight separation, but no calcific tendonitis or significant glenohumeral degenerative change. Physical examination reveals painful and weak shoulder movements, indicating a rotator cuff tear. MRI was ordered by orthopedic surgeon, with findings suggestive of  mild to moderate insertional supraspinatus tendinopathy & probable small interstitial tear to anterior insertion without a full-thickness tear.  PMH significant for abdominal hernia, aortic ejection murmur, chronic hepatitis C, depression, DM, GERD, headache, hepatic cirrhosis, HTN, anemia, irregular heart rhythm, palpitations  Pt states that the orthopedic surgeon wants her to do 4 weeks of PT and if the pain does not improve then they will do surgery.  PAIN:  Are you having pain? Yes: NPRS scale: 7/10 currently Pain location: Anterior shoulder, into lateral deltoid Pain description: aching Aggravating factors: Reaching overhead, behind her, forward Relieving factors: Biofreeze helped initially  PRECAUTIONS: None  RED FLAGS: None   WEIGHT BEARING RESTRICTIONS: No  FALLS:  Has patient fallen in last 6 months? 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  DIAGNOSTIC FINDINGS:  MRI Shoulder Left WO Contrast (from 04/21/24):  FINDINGS: There is mild to moderate insertional tendinopathy of the supraspinatus tendon and a small interstitial tear to the anterior insertion. No retraction or full-thickness tear. Mild impingement change deep to the infraspinatus insertion. The subscapularis and teres minor tendons are unremarkable. There is mild-to-moderate tendinopathy of the  intra-articular biceps tendon greater distally. Fibers are intact.   There is no labral tear. Mild glenohumeral and acromioclavicular osteoarthritis with joint space narrowing and mild degenerative edema.   IMPRESSION: Mild to moderate insertional tendinopathy of the supraspinatus tendon and a probable small interstitial partial tear to the anterior insertion without retraction or full-thickness tear.   Mild-to-moderate tendinopathy of the intra-articular biceps tendon.   Mild glenohumeral and acromioclavicular osteoarthritis.  PATIENT SURVEYS:  QuickDASH: 88.6 / 100 = 88.6 %  COGNITION: Overall cognitive status: Within functional limits for tasks assessed     SENSATION: WFL Light touch: Pt reports that it feels duller on her L side around her shoulder  POSTURE: Slight forward head, rounded shoulders- pt holds L UE in guarded position against body at rest  CERVICAL ROM: all motions appeared WFL, pt has pain at end range extension and end range rotation to L  UPPER EXTREMITY ROM:    Active ROM Right eval Left eval  Shoulder flexion 150 65* active, 120 passive  Shoulder extension    Shoulder abduction 165 60* active, 100 passive  Shoulder adduction    Shoulder internal rotation 60 53  Shoulder external rotation 75 50  Elbow flexion    Elbow extension    Wrist flexion    Wrist extension    Wrist ulnar deviation    Wrist radial deviation    Wrist pronation    Wrist supination    (Blank rows = not tested)  UPPER EXTREMITY MMT: Unable to assess at eval due to significant pain/ROM limitations   SHOULDER SPECIAL TESTS: Rotator cuff assessment: Full can test: positive    PALPATION:  Tender on palpation of L anterior shoulder, L AC joint, Supraspinatus tendon  TREATMENT DATE: 08/20/24  Neuro:  Static stance on Airex pad, 30 sec  bouts Static stance on Airex pad, eyes closed 30 sec bouts Static NBOS on Airex pad, 30 sec bouts Static NBOS on Airex pad, eyes closed, 30 sec bouts Static staggered stance on Airex pad, 30 sec bouts Static staggered stance on Airex pad, eyes closed, 30 sec bouts Static SLS on Airex pad, eyes open, 30 sec bouts each LE  Ambulation in hallway with head turns, length of hallway x2 Ambulation in hallway with head nods, length of hallway x2 Backwards ambulation in hallway, length of hallway x2 Lateral ambulation in hallway, length of hallway x1 each direction  CGA utilized for pt safety throughout the session  Shoulder was assessed at the conclusion of the session per the pt request, however is making good improvement with it and no increase in pain levels with the PROM.   PATIENT EDUCATION: Education details: Pt educated on importance of maintaining movement in L shoulder for healing purposes and to prevent frozen shoulder. Pt educated on HEP and provided with full demonstration as well as cues to prevent pushing AROM passed point of severe pain Person educated: Patient Education method: Explanation, Demonstration, Verbal cues, and Handouts Education comprehension: verbalized understanding and returned demonstration  HOME EXERCISE PROGRAM: Access Code: [email or web address] URL: [email or web address] Date: 05/22/2024 Prepared by: Lonni Gainer  Exercises - Seated Shoulder Flexion Towel Slide at Table Top  - 2 x daily - 7 x weekly - 10 reps - 5 sec hold - Seated Shoulder Abduction Towel Slide at Table Top  - 2 x daily - 7 x weekly - 10 reps - 5 sec hold - Supine Isometric Shoulder Extension with Towel  - 2 x daily - 7 x weekly - 2 sets - 10 reps  ASSESSMENT:  CLINICAL IMPRESSION:   Pt performed balance training activities and performed well with the tasks given.  Pt was able to demonstrate good technique and righting strategies, however did have some ankle instability while on  the airex pad at times.  Pt will continue to perform balance related tasks in order challenge and improve balance for improved gait mechanics and stability with ambulation.   Pt will continue to benefit from skilled therapy to address remaining deficits in order to improve overall QoL and return to PLOF.        OBJECTIVE IMPAIRMENTS: decreased activity tolerance, decreased mobility, decreased ROM, decreased strength, hypomobility, impaired flexibility, impaired sensation, impaired UE functional use, improper body mechanics, postural dysfunction, and pain.   ACTIVITY LIMITATIONS: carrying, lifting, sleeping, transfers, bed mobility, bathing, toileting, dressing, self feeding, reach over head, hygiene/grooming, locomotion level, and caring for others  PARTICIPATION LIMITATIONS: meal prep, cleaning, laundry, driving, shopping, community activity, and yard work  PERSONAL FACTORS: Age, Profession, Time since onset of injury/illness/exacerbation, and 3+ comorbidities: abdominal hernia, aortic ejection murmur, chronic hepatitis C, depression, DM, GERD, headache, hepatic cirrhosis, HTN, anemia, irregular heart rhythm, palpitations are also affecting patient's functional outcome.   REHAB POTENTIAL: Good  CLINICAL DECISION MAKING: Evolving/moderate complexity  EVALUATION COMPLEXITY: Moderate   GOALS: Goals reviewed with patient? Yes  SHORT TERM GOALS: Target date: 06/06/2024  Patient will be independent in home exercise program to improve strength/mobility for better functional independence with ADLs. Baseline: see HEP above Goal status: MET   LONG TERM GOALS: Target date: 11/12/2024  Patient will decrease Quick DASH score by > 8 points demonstrating reduced self-reported upper extremity disability. Baseline: 88.6 / 100 = 88.6 % 07/23/24: 59.1/100 = 59.1%  Goal status: INITIAL  2.  Patient will improve L shoulder AROM to > 140 degrees of flexion and abduction for improved ability to perform  overhead activities. Baseline: flexion- 65 active, 120 passive; abduction- 60 active, 100 passive 07/02/24: Flexion: 54 deg active; Abduction: 48 deg Goal status: NOT PROGRESSING  3.  Pt will report worst shoulder pain of 3/10 on NPS to demonstrated improvements in pain and to allow for improved UE function Baseline: 7.5/10 07/02/24: 7/10 07/23/24: 7/10 Goal status: PROGRESSING  4.  Pt will increase L UE strength to 4+/5 or better to   Baseline: assess at future session 07/02/24: not able to assess due to pain Goal status: INITIAL  5.  Patient (> 31 years old) will complete five times sit to stand test in < 15 seconds indicating an increased LE strength and improved balance. Baseline: 29.81 sec Goal status: INITIAL   6.  Patient will increase Berg Balance score by > 6 points to demonstrate decreased fall risk during functional activities. Baseline: TBD Goal status: INITIAL   7.  Patient will reduce timed up and go to <11 seconds to reduce fall risk and demonstrate improved transfer/gait ability. Baseline: 14.51 sec Goal status: INITIAL  8.  Patient will increase 10 meter walk test to >1.37m/s as to improve gait speed for better community ambulation and to reduce fall risk. Baseline: 11.69 sec; 0.85 m/s Goal status: INITIAL  9.  Patient will increase six minute walk test distance to >1300 for progression to community ambulator and improve gait ability. Baseline: 1185' Goal status: INITIAL  10.  Pt will improve ABC by at least 13% in order to demonstrate clinically significant improvement in balance confidence. Baseline: 24.38% Goal status: INITIAL     PLAN:  PT FREQUENCY: 2x/week  PT DURATION: 8 weeks  PLANNED INTERVENTIONS: [id number]- PT Re-evaluation, 97750- Physical Performance Testing, 97110-Therapeutic exercises, 97530- Therapeutic activity, [id number]- Neuromuscular re-education, 97535- Self Care, [id number]- Manual therapy, [id number]- Orthotic Initial, [id number]- Orthotic/Prosthetic  subsequent, [id number]- Canalith repositioning, [id number]- Splinting, [id number]- Electrical stimulation (manual), [id number]- Ultrasound, [id number]- Traction (mechanical), [phone] (1-2 muscles), [id number] (3+ muscles)- Dry Needling, Patient/Family education, Balance training, Stair training, Taping, Joint mobilization, Spinal mobilization, Vestibular training, DME instructions, Cryotherapy, and Moist heat  PLAN FOR NEXT SESSION:    Review and Progress HEP L shoulder AAROM/AROM activities L shoulder manual therapy/joint mobilizations as tolerated/needed for pain control, isometrics Increase shoulder exercises to provide increased stability Continue with balance training to improve overall tolerance and when re-certification comes, add in additional balance related tasks for MD to sign off on.  Fonda Simpers, PT, DPT Physical Therapist - [facility]  08/20/24, 11:48 AM

## 2024-08-21 ENCOUNTER — Ambulatory Visit: Admitting: Dermatology

## 2024-08-22 ENCOUNTER — Ambulatory Visit

## 2024-08-22 DIAGNOSIS — M6281 Muscle weakness (generalized): Secondary | ICD-10-CM

## 2024-08-22 DIAGNOSIS — R262 Difficulty in walking, not elsewhere classified: Secondary | ICD-10-CM

## 2024-08-22 DIAGNOSIS — R2689 Other abnormalities of gait and mobility: Secondary | ICD-10-CM

## 2024-08-22 NOTE — Therapy (Signed)
 OUTPATIENT PHYSICAL THERAPY SHOULDER/BALANCE TREATMENT/RE-CERT   Patient Name: Roberta Lane MRN: [id number] DOB:[birth year], 68 y.o., female Today's Date: 08/22/2024  END OF SESSION:   PT End of Session - 08/22/24 0933     Visit Number 15    Number of Visits 39    Date for Recertification  11/14/24    Progress Note Due on Visit 10    PT Start Time 0934    PT Stop Time 1015    PT Time Calculation (min) 41 min    Activity Tolerance Patient tolerated treatment well;Patient limited by pain    Behavior During Therapy [facility] for tasks assessed/performed          Past Medical History:  Diagnosis Date   Abdominal hernia    Allergic genetic state    Aortic ejection murmur    Ascites    Chronic hepatitis C (HCC)    Depression    Diabetes mellitus without complication (HCC)    Dysrhythmia    GERD (gastroesophageal reflux disease)    GIB (gastrointestinal bleeding)    Headache    Hepatic cirrhosis (HCC)    Hepatitis C    Hernia, abdominal    Hyperlipidemia    Hypertension    IDA (iron  deficiency anemia)    Inflamed seborrheic keratosis    Interdigital neuroma of foot    Irregular heart rhythm    Liver disease    Multiple renal cysts    Palpitations    Pancytopenia (HCC)    Portal hypertension with esophageal varices (HCC)    Splenomegaly    UTI (urinary tract infection)    Past Surgical History:  Procedure Laterality Date   ABDOMINAL HYSTERECTOMY     CHOLECYSTECTOMY     COLONOSCOPY WITH PROPOFOL  N/A 12/05/2023   Procedure: COLONOSCOPY WITH PROPOFOL ;  Surgeon: Toledo, Ladell POUR, MD;  Location: [facility] ENDOSCOPY;  Service: Gastroenterology;  Laterality: N/A;  DM   ESOPHAGOGASTRODUODENOSCOPY N/A 02/17/2021   Procedure: ESOPHAGOGASTRODUODENOSCOPY (EGD);  Surgeon: Toledo, Ladell POUR, MD;  Location: [facility] ENDOSCOPY;  Service: Gastroenterology;  Laterality: N/A;   ESOPHAGOGASTRODUODENOSCOPY (EGD) WITH PROPOFOL  N/A 11/23/2017   Procedure: ESOPHAGOGASTRODUODENOSCOPY (EGD) WITH  PROPOFOL ;  Surgeon: Toledo, Ladell POUR, MD;  Location: [facility] ENDOSCOPY;  Service: Gastroenterology;  Laterality: N/A;   ESOPHAGOGASTRODUODENOSCOPY (EGD) WITH PROPOFOL  N/A 10/23/2018   Procedure: ESOPHAGOGASTRODUODENOSCOPY (EGD) WITH PROPOFOL ;  Surgeon: Toledo, Ladell POUR, MD;  Location: [facility] ENDOSCOPY;  Service: Gastroenterology;  Laterality: N/A;   ESOPHAGOGASTRODUODENOSCOPY (EGD) WITH PROPOFOL  N/A 10/10/2019   Procedure: ESOPHAGOGASTRODUODENOSCOPY (EGD) WITH PROPOFOL ;  Surgeon: Toledo, Ladell POUR, MD;  Location: [facility] END[facility]COPY;  Service: Gastroenterology;  Laterality: N/A;   ESOPHAGOGASTRODUODENOSCOPY (EGD) WITH PROPOFOL  N/A 11/14/2019   Procedure: ESOPHAGOGASTRODUODENOSCOPY (EGD) WITH PROPOFOL ;  Surgeon: Toledo, Ladell POUR, MD;  Location: [facility] END[facility];  Service: Gastroenterology;  Laterality: N/A;   ESOPHAGOGASTRODUODENOSCOPY (EGD) WITH PROPOFOL  N/A 12/05/2023   Procedure: ESOPHAGOGASTRODUODENOSCOPY (EGD) WITH PROPOFOL ;  Surgeon: Toledo, Ladell POUR, MD;  Location: [facility] ENDOSCOPY;  Service: Gastroenterology;  Laterality: N/A;   right elbow surgery     WISDOM TOOTH EXTRACTION     Patient Active Problem List   Diagnosis Date Noted   Renal mass, left 04/11/2020   Hypertension 03/18/2020   Hepatic cirrhosis due to chronic hepatitis C infection (HCC) 11/28/2019   Chronic pain of both knees 03/26/2019   Other ascites 03/26/2019   Aortic stenosis, mild 01/07/2019   Iron  deficiency anemia 12/31/2018   Hepatic encephalopathy (HCC) 07/20/2018   Frequent PVCs 06/13/2018   Portal hypertension with esophageal varices (HCC) 06/12/2018  Aortic ejection murmur 05/29/2018   Dizziness 05/29/2018   Hyperlipidemia, mixed 05/29/2018   Palpitations 05/29/2018   Controlled type 2 diabetes mellitus without complication, without long-term current use of insulin  (HCC) 05/17/2018   Abdominal pain, diffuse 12/14/2017   Gastroesophageal reflux disease without esophagitis 12/14/2017   Pancytopenia (HCC) 12/10/2017    GIB (gastrointestinal bleeding) 11/21/2017    PCP: Lynwood Null, MD  REFERRING PROVIDER: Ozell Flake, MD  REFERRING DIAG: S46.012D (ICD-10-CM) - Strain of muscle(s) and tendon(s) of the rotator cuff of left shoulder, subsequent encounter   THERAPY DIAG:  Imbalance  Muscle weakness (generalized)  Difficulty in walking, not elsewhere classified  Rationale for Evaluation and Treatment: Rehabilitation  ONSET DATE: October 2024  SUBJECTIVE:                                                                                                                                                                                      SUBJECTIVE STATEMENT:   Pt reports that her shoulder continues to feel good.  Pt notes that she has still been having some imbalance issues.      PERTINENT HISTORY: Pt was seen by PCP at [facility] on 04/11/24 with c/c of L shoulder pain that has progressively worsened after a fall pt sustained in October of 2024. Pt states that she lifted her 35 pound suitcase onto the shuttle bus at the airport in [place] and she caught her foot on the step of the shuttle bus and pulled her suitcase and it fell backward onto her shoulder. PCP referred pt to orthopedic surgery, who saw pt on 04/17/24. Per orthopedic surgery note from 6/25: A suspected rotator cuff tear in the left shoulder followed a fall in October. Persistent pain radiating to the forearm and thumb suggests possible nerve involvement. X-ray shows mild AC degenerative changes with slight separation, but no calcific tendonitis or significant glenohumeral degenerative change. Physical examination reveals painful and weak shoulder movements, indicating a rotator cuff tear. MRI was ordered by orthopedic surgeon, with findings suggestive of mild to moderate insertional supraspinatus tendinopathy & probable small interstitial tear to anterior insertion without a full-thickness tear.  PMH significant for abdominal  hernia, aortic ejection murmur, chronic hepatitis C, depression, DM, GERD, headache, hepatic cirrhosis, HTN, anemia, irregular heart rhythm, palpitations  Pt states that the orthopedic surgeon wants her to do 4 weeks of PT and if the pain does not improve then they will do surgery.   PAIN:  Are you having pain? Yes: NPRS scale: 7/10 currently Pain location: Anterior shoulder, into lateral deltoid Pain description: aching Aggravating factors: Reaching overhead, behind her, forward Relieving factors: Biofreeze helped initially  PRECAUTIONS: None  RED FLAGS: None   WEIGHT BEARING RESTRICTIONS: No  FALLS:  Has patient fallen in last 6 months? 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  DIAGNOSTIC FINDINGS:  MRI Shoulder Left WO Contrast (from 04/21/24):  FINDINGS: There is mild to moderate insertional tendinopathy of the supraspinatus tendon and a small interstitial tear to the anterior insertion. No retraction or full-thickness tear. Mild impingement change deep to the infraspinatus insertion. The subscapularis and teres minor tendons are unremarkable. There is mild-to-moderate tendinopathy of the intra-articular biceps tendon greater distally. Fibers are intact.   There is no labral tear. Mild glenohumeral and acromioclavicular osteoarthritis with joint space  narrowing and mild degenerative edema.   IMPRESSION: Mild to moderate insertional tendinopathy of the supraspinatus tendon and a probable small interstitial partial tear to the anterior insertion without retraction or full-thickness tear.   Mild-to-moderate tendinopathy of the intra-articular biceps tendon.   Mild glenohumeral and acromioclavicular osteoarthritis.  PATIENT SURVEYS:  QuickDASH: 88.6 / 100 = 88.6 %  COGNITION: Overall cognitive status: Within functional limits for tasks assessed     SENSATION: WFL Light touch: Pt reports that it feels duller on her L side around her shoulder  POSTURE: Slight forward head, rounded shoulders- pt holds L UE in guarded position against body at rest  CERVICAL ROM: all motions appeared WFL, pt has pain at end range extension and end range rotation to L  UPPER EXTREMITY ROM:    Active ROM Right eval Left eval  Shoulder flexion 150 65* active, 120 passive  Shoulder extension    Shoulder abduction 165 60* active, 100 passive  Shoulder adduction    Shoulder internal rotation 60 53  Shoulder external rotation 75 50  Elbow flexion    Elbow extension    Wrist flexion    Wrist extension    Wrist ulnar deviation    Wrist radial deviation    Wrist pronation    Wrist supination    (Blank rows = not tested)  UPPER EXTREMITY MMT: Unable to assess at eval due to significant pain/ROM limitations   SHOULDER SPECIAL TESTS: Rotator cuff assessment: Full can test: positive    PALPATION:  Tender on palpation of L anterior shoulder, L AC joint, Supraspinatus tendon  TREATMENT DATE: 08/22/24  Self-Care Home Management:  Education regarding current POC and switching from UE to doing balance training to prevent future falls.  Pt assessed and found to have some difficulty with the balance tests.      Neuro:  Ambulation to  the cancer center and back with head turns and head nods, 1000' total  Static stance on incline board, EO, 30 sec bouts x2 Static stance on incline board, EC, 30 sec bouts x2 Static stance on decline board, EO, 30 sec bouts x2 Static stance on decline board, EO, 30 sec bouts x2  CGA utilized for pt safety throughout the session  Shoulder was assessed at the conclusion of the session per the pt request, however is making good improvement with it and no increase in pain levels with the PROM.   PATIENT EDUCATION: Education details: Pt educated on importance of maintaining movement in L shoulder for healing purposes and to prevent frozen shoulder. Pt educated on HEP and provided with full demonstration as well as cues to prevent pushing AROM passed point of severe pain Person educated: Patient Education method: Explanation, Demonstration, Verbal cues, and Handouts Education comprehension: verbalized understanding and returned demonstration  HOME EXERCISE PROGRAM: Access Code: [email or web address] URL: [email or web address] Date: 05/22/2024 Prepared by: Lonni Gainer  Exercises - Seated Shoulder Flexion Towel Slide at Table Top  - 2 x daily - 7 x weekly - 10 reps - 5 sec hold - Seated Shoulder Abduction Towel Slide at Table Top  - 2 x daily - 7 x weekly - 10 reps - 5 sec hold - Supine Isometric Shoulder Extension with Towel  - 2 x daily - 7 x weekly - 2 sets - 10 reps  ASSESSMENT:  CLINICAL IMPRESSION:   Pt continues to respond well with therapy, noting a significant reduction in Ue symptoms and transitioning to more balance related activities to prevent future falls.  Pt's primary concern is her confidence levels specifically with ambulation, stepping down from curbs, and turns.  Pt does have good static stability and will continue to be assessed in future sessions for more dynamic stability as therapy progresses.  Pt sessions in the future will focus more primarily on balance  training.  Patient's condition has the potential to improve in response to therapy. Maximum improvement is yet to be obtained. The anticipated improvement is attainable and reasonable in a generally predictable time.   Pt will continue to benefit from skilled therapy to address remaining deficits in order to improve overall QoL and return to PLOF.          OBJECTIVE IMPAIRMENTS: decreased activity tolerance, decreased mobility, decreased ROM, decreased strength, hypomobility, impaired flexibility, impaired sensation, impaired UE functional use, improper body mechanics, postural dysfunction, and pain.   ACTIVITY LIMITATIONS: carrying, lifting, sleeping, transfers, bed mobility, bathing, toileting, dressing, self feeding, reach over head, hygiene/grooming, locomotion level, and caring for others  PARTICIPATION LIMITATIONS: meal prep, cleaning, laundry, driving, shopping, community activity, and yard work  PERSONAL FACTORS: Age, Profession, Time since onset of injury/illness/exacerbation, and 3+ comorbidities: abdominal hernia, aortic ejection murmur, chronic hepatitis C, depression, DM, GERD, headache, hepatic cirrhosis, HTN, anemia, irregular heart rhythm, palpitations are also affecting patient's functional outcome.   REHAB POTENTIAL: Good  CLINICAL DECISION MAKING: Evolving/moderate complexity  EVALUATION COMPLEXITY: Moderate   GOALS: Goals reviewed with patient? Yes  SHORT TERM GOALS: Target date: 06/06/2024  Patient will be independent in home exercise program to improve strength/mobility for better functional independence with ADLs. Baseline: see HEP above  Goal status: MET   LONG TERM GOALS: Target date: 11/12/2024  Patient will decrease Quick DASH score by > 8 points demonstrating reduced self-reported upper extremity disability. Baseline: 88.6 / 100 = 88.6 % 07/23/24: 59.1/100 = 59.1% Goal status: INITIAL  2.  Patient will improve L shoulder AROM to > 140 degrees of flexion  and abduction for improved ability to perform overhead activities. Baseline: flexion- 65 active, 120 passive; abduction- 60 active, 100 passive 07/02/24: Flexion: 54 deg active; Abduction: 48 deg Goal status: NOT PROGRESSING  3.  Pt will report worst shoulder pain of 3/10 on NPS to demonstrated improvements in pain and to allow for improved UE function Baseline: 7.5/10 07/02/24: 7/10 07/23/24: 7/10 Goal status: PROGRESSING  4.  Pt will increase L UE strength to 4+/5 or better to   Baseline: assess at future session 07/02/24: not able to assess due to pain Goal status: INITIAL  5.  Patient (> 49 years old) will complete five times sit to stand test in < 15 seconds indicating an increased LE strength and improved balance. Baseline: 29.81 sec Goal status: INITIAL   6.  Patient will increase Berg Balance score by > 6 points to demonstrate decreased fall risk during functional activities. Baseline: TBD Goal status: INITIAL   7.  Patient will reduce timed up and go to <11 seconds to reduce fall risk and demonstrate improved transfer/gait ability. Baseline: 14.51 sec Goal status: INITIAL  8.  Patient will increase 10 meter walk test to >1.30m/s as to improve gait speed for better community ambulation and to reduce fall risk. Baseline: 11.69 sec; 0.85 m/s Goal status: INITIAL  9.  Patient will increase six minute walk test distance to >1300 for progression to community ambulator and improve gait ability. Baseline: 1185' Goal status: INITIAL  10.  Pt will improve ABC by at least 13% in order to demonstrate clinically significant improvement in balance confidence. Baseline: 24.38% Goal status: INITIAL     PLAN:  PT FREQUENCY: 2x/week  PT DURATION: 8 weeks  PLANNED INTERVENTIONS: [id number]- PT Re-evaluation, 97750- Physical Performance Testing, 97110-Therapeutic exercises, 97530- Therapeutic activity, [id number]- Neuromuscular re-education, 97535- Self Care, [id number]- Manual therapy, [id number]-  Orthotic Initial, [id number]- Orthotic/Prosthetic subsequent, [id number]- Canalith repositioning, [id number]- Splinting, [id number]- Electrical stimulation (manual), [id number]- Ultrasound, [id number]- Traction (mechanical), [phone] (1-2 muscles), [id number] (3+ muscles)- Dry Needling, Patient/Family education, Balance training, Stair training, Taping, Joint mobilization, Spinal mobilization, Vestibular training, DME instructions, Cryotherapy, and Moist heat  PLAN FOR NEXT SESSION:    Review and Progress HEP L shoulder AAROM/AROM activities L shoulder manual therapy/joint mobilizations as tolerated/needed for pain control, isometrics Increase shoulder exercises to provide increased stability Continue with balance training to improve overall tolerance and when re-certification comes, add in additional balance related tasks for MD to sign off on.  Fonda Simpers, PT, DPT Physical Therapist - [facility]  08/22/24, 9:38 AM

## 2024-08-26 ENCOUNTER — Ambulatory Visit: Admitting: Physical Therapy

## 2024-08-26 NOTE — Addendum Note (Signed)
 Addended by: SILVER FONDA ORN on: 08/26/2024 11:06 AM   Modules accepted: Orders

## 2024-08-28 ENCOUNTER — Ambulatory Visit: Attending: Orthopedic Surgery

## 2024-08-28 ENCOUNTER — Telehealth: Payer: Self-pay

## 2024-08-28 DIAGNOSIS — G8929 Other chronic pain: Secondary | ICD-10-CM | POA: Insufficient documentation

## 2024-08-28 DIAGNOSIS — R2689 Other abnormalities of gait and mobility: Secondary | ICD-10-CM | POA: Insufficient documentation

## 2024-08-28 DIAGNOSIS — M6281 Muscle weakness (generalized): Secondary | ICD-10-CM | POA: Insufficient documentation

## 2024-08-28 DIAGNOSIS — M25512 Pain in left shoulder: Secondary | ICD-10-CM | POA: Insufficient documentation

## 2024-08-28 DIAGNOSIS — R262 Difficulty in walking, not elsewhere classified: Secondary | ICD-10-CM | POA: Insufficient documentation

## 2024-08-28 DIAGNOSIS — M25612 Stiffness of left shoulder, not elsewhere classified: Secondary | ICD-10-CM | POA: Insufficient documentation

## 2024-08-28 NOTE — Therapy (Incomplete)
 OUTPATIENT PHYSICAL THERAPY SHOULDER/BALANCE TREATMENT   Patient Name: Roberta Lane MRN: [id number] DOB:[birth year], 68 y.o., female Today's Date: 08/28/2024  END OF SESSION:     Past Medical History:  Diagnosis Date   Abdominal hernia    Allergic genetic state    Aortic ejection murmur    Ascites    Chronic hepatitis C (HCC)    Depression    Diabetes mellitus without complication (HCC)    Dysrhythmia    GERD (gastroesophageal reflux disease)    GIB (gastrointestinal bleeding)    Headache    Hepatic cirrhosis (HCC)    Hepatitis C    Hernia, abdominal    Hyperlipidemia    Hypertension    IDA (iron  deficiency anemia)    Inflamed seborrheic keratosis    Interdigital neuroma of foot    Irregular heart rhythm    Liver disease    Multiple renal cysts    Palpitations    Pancytopenia (HCC)    Portal hypertension with esophageal varices (HCC)    Splenomegaly    UTI (urinary tract infection)    Past Surgical History:  Procedure Laterality Date   ABDOMINAL HYSTERECTOMY     CHOLECYSTECTOMY     COLONOSCOPY WITH PROPOFOL  N/A 12/05/2023   Procedure: COLONOSCOPY WITH PROPOFOL ;  Surgeon: Toledo, Ladell POUR, MD;  Location: [facility];  Service: Gastroenterology;  Laterality: N/A;  DM   ESOPHAGOGASTRODUODENOSCOPY N/A 02/17/2021   Procedure: ESOPHAGOGASTRODUODENOSCOPY (EGD);  Surgeon: Toledo, Ladell POUR, MD;  Location: [facility];  Service: Gastroenterology;  Laterality: N/A;   ESOPHAGOGASTRODUODENOSCOPY (EGD) WITH PROPOFOL  N/A 11/23/2017   Procedure: ESOPHAGOGASTRODUODENOSCOPY (EGD) WITH PROPOFOL ;  Surgeon: Toledo, Ladell POUR, MD;  Location: [facility];  Service: Gastroenterology;  Laterality: N/A;   ESOPHAGOGASTRODUODENOSCOPY (EGD) WITH PROPOFOL  N/A 10/23/2018   Procedure: ESOPHAGOGASTRODUODENOSCOPY (EGD) WITH PROPOFOL ;  Surgeon: Toledo, Ladell POUR, MD;  Location: [facility];  Service: Gastroenterology;  Laterality: N/A;   ESOPHAGOGASTRODUODENOSCOPY (EGD) WITH  PROPOFOL  N/A 10/10/2019   Procedure: ESOPHAGOGASTRODUODENOSCOPY (EGD) WITH PROPOFOL ;  Surgeon: Toledo, Ladell POUR, MD;  Location: [facility];  Service: Gastroenterology;  Laterality: N/A;   ESOPHAGOGASTRODUODENOSCOPY (EGD) WITH PROPOFOL  N/A 11/14/2019   Procedure: ESOPHAGOGASTRODUODENOSCOPY (EGD) WITH PROPOFOL ;  Surgeon: Toledo, Ladell POUR, MD;  Location: [facility];  Service: Gastroenterology;  Laterality: N/A;   ESOPHAGOGASTRODUODENOSCOPY (EGD) WITH PROPOFOL  N/A 12/05/2023   Procedure: ESOPHAGOGASTRODUODENOSCOPY (EGD) WITH PROPOFOL ;  Surgeon: Toledo, Ladell POUR, MD;  Location: [facility];  Service: Gastroenterology;  Laterality: N/A;   right elbow surgery     WISDOM TOOTH EXTRACTION     Patient Active Problem List   Diagnosis Date Noted   Renal mass, left 04/11/2020   Hypertension 03/18/2020   Hepatic cirrhosis due to chronic hepatitis C infection (HCC) 11/28/2019   Chronic pain of both knees 03/26/2019   Other ascites 03/26/2019   Aortic stenosis, mild 01/07/2019   Iron  deficiency anemia 12/31/2018   Hepatic encephalopathy (HCC) 07/20/2018   Frequent PVCs 06/13/2018   Portal hypertension with esophageal varices (HCC) 06/12/2018   Aortic ejection murmur 05/29/2018   Dizziness 05/29/2018   Hyperlipidemia, mixed 05/29/2018   Palpitations 05/29/2018   Controlled type 2 diabetes mellitus without complication, without long-term current use of insulin  (HCC) 05/17/2018   Abdominal pain, diffuse 12/14/2017   Gastroesophageal reflux disease without esophagitis 12/14/2017   Pancytopenia (HCC) 12/10/2017   GIB (gastrointestinal bleeding) 11/21/2017    PCP: Lynwood Null, MD  REFERRING PROVIDER: Ozell Flake, MD  REFERRING DIAG: S46.012D (ICD-10-CM) - Strain of muscle(s) and tendon(s) of the rotator cuff of  left shoulder, subsequent encounter   THERAPY DIAG:  No diagnosis found.  Rationale for Evaluation and Treatment: Rehabilitation  ONSET DATE: October 2024  SUBJECTIVE:                                                                                                                                                                                       SUBJECTIVE STATEMENT:   ***    PERTINENT HISTORY: Pt was seen by PCP at [facility] on 04/11/24 with c/c of L shoulder pain that has progressively worsened after a fall pt sustained in October of 2024. Pt states that she lifted her 35 pound suitcase onto the shuttle bus at the airport in [place] and she caught her foot on the step of the shuttle bus and pulled her suitcase and it fell backward onto her shoulder. PCP referred pt to orthopedic surgery, who saw pt on 04/17/24. Per orthopedic surgery note from 6/25: A suspected rotator cuff tear in the left shoulder followed a fall in October. Persistent pain radiating to the forearm and thumb suggests possible nerve involvement. X-ray shows mild AC degenerative changes with slight separation, but no calcific tendonitis or significant glenohumeral degenerative change. Physical examination reveals painful and weak shoulder movements, indicating a rotator cuff tear. MRI was ordered by orthopedic surgeon, with findings suggestive of mild to moderate insertional supraspinatus tendinopathy & probable small interstitial tear to anterior insertion without a full-thickness tear.  PMH significant for abdominal hernia, aortic ejection murmur, chronic hepatitis C, depression, DM, GERD, headache, hepatic cirrhosis, HTN, anemia, irregular heart rhythm, palpitations  Pt states that the orthopedic surgeon wants her to do 4 weeks of PT and if the pain does not improve then they will do surgery.   PAIN:  Are you having pain? Yes: NPRS scale: 7/10 currently Pain location: Anterior shoulder, into lateral deltoid Pain description: aching Aggravating factors: Reaching overhead, behind her, forward Relieving factors: Biofreeze helped initially  PRECAUTIONS: None  RED  FLAGS: None   WEIGHT BEARING RESTRICTIONS: No  FALLS:  Has patient fallen in last 6 months? 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  DIAGNOSTIC FINDINGS:  MRI Shoulder Left WO Contrast (from 04/21/24):  FINDINGS: There is mild to moderate insertional tendinopathy of the supraspinatus tendon and a small interstitial tear to the anterior insertion. No retraction or full-thickness tear. Mild impingement change deep to the infraspinatus insertion. The subscapularis and teres minor tendons are unremarkable. There is mild-to-moderate tendinopathy of the intra-articular biceps tendon greater distally. Fibers are intact.   There is no labral tear. Mild glenohumeral and acromioclavicular osteoarthritis with joint space narrowing and mild degenerative edema.   IMPRESSION: Mild to moderate insertional tendinopathy of the supraspinatus tendon and a probable small interstitial partial tear to the anterior insertion without retraction or full-thickness tear.   Mild-to-moderate tendinopathy of the intra-articular biceps tendon.   Mild glenohumeral and acromioclavicular osteoarthritis.  PATIENT SURVEYS:  QuickDASH: 88.6 / 100 = 88.6 %  COGNITION: Overall cognitive status: Within functional limits for tasks assessed     SENSATION: WFL Light touch: Pt reports that it feels  duller on her L side around her shoulder  POSTURE: Slight forward head, rounded shoulders- pt holds L UE in guarded position against body at rest  CERVICAL ROM: all motions appeared WFL, pt has pain at end range extension and end range rotation to L  UPPER EXTREMITY ROM:    Active ROM Right eval Left eval  Shoulder flexion 150 65* active, 120 passive  Shoulder extension    Shoulder abduction 165 60* active, 100 passive  Shoulder adduction    Shoulder internal rotation 60 53  Shoulder external rotation 75 50  Elbow flexion    Elbow extension    Wrist flexion    Wrist extension    Wrist ulnar deviation    Wrist radial deviation    Wrist pronation    Wrist supination    (Blank rows = not tested)  UPPER EXTREMITY MMT: Unable to assess at eval due to significant pain/ROM limitations   SHOULDER SPECIAL TESTS: Rotator cuff assessment: Full can test: positive    PALPATION:  Tender on palpation of L anterior shoulder, L AC joint, Supraspinatus tendon                                                                                                             TREATMENT DATE: 08/28/24  *** Self-Care Home Management:  Education regarding current POC and switching from UE to doing balance training to prevent future falls.  Pt assessed and found to have some difficulty with the balance tests.      Neuro:  Ambulation to the cancer center and back with head turns and head nods, 1000' total  Static stance on incline board, EO, 30 sec bouts x2 Static stance on incline board, EC, 30 sec bouts x2 Static stance on decline board, EO, 30 sec bouts x2 Static stance on decline board, EO, 30 sec bouts x2  CGA utilized for pt safety throughout the session  Shoulder was assessed at the conclusion of  the session per the pt request, however is making good improvement with it and no increase in pain levels with the PROM.   PATIENT EDUCATION: Education details: Pt educated on  importance of maintaining movement in L shoulder for healing purposes and to prevent frozen shoulder. Pt educated on HEP and provided with full demonstration as well as cues to prevent pushing AROM passed point of severe pain Person educated: Patient Education method: Explanation, Demonstration, Verbal cues, and Handouts Education comprehension: verbalized understanding and returned demonstration  HOME EXERCISE PROGRAM: Access Code: [email or web address] URL: [email or web address] Date: 05/22/2024 Prepared by: Lonni Gainer  Exercises - Seated Shoulder Flexion Towel Slide at Table Top  - 2 x daily - 7 x weekly - 10 reps - 5 sec hold - Seated Shoulder Abduction Towel Slide at Table Top  - 2 x daily - 7 x weekly - 10 reps - 5 sec hold - Supine Isometric Shoulder Extension with Towel  - 2 x daily - 7 x weekly - 2 sets - 10 reps  ASSESSMENT:  CLINICAL IMPRESSION:   ***       OBJECTIVE IMPAIRMENTS: decreased activity tolerance, decreased mobility, decreased ROM, decreased strength, hypomobility, impaired flexibility, impaired sensation, impaired UE functional use, improper body mechanics, postural dysfunction, and pain.   ACTIVITY LIMITATIONS: carrying, lifting, sleeping, transfers, bed mobility, bathing, toileting, dressing, self feeding, reach over head, hygiene/grooming, locomotion level, and caring for others  PARTICIPATION LIMITATIONS: meal prep, cleaning, laundry, driving, shopping, community activity, and yard work  PERSONAL FACTORS: Age, Profession, Time since onset of injury/illness/exacerbation, and 3+ comorbidities: abdominal hernia, aortic ejection murmur, chronic hepatitis C, depression, DM, GERD, headache, hepatic cirrhosis, HTN, anemia, irregular heart rhythm, palpitations are also affecting patient's functional outcome.   REHAB POTENTIAL: Good  CLINICAL DECISION MAKING: Evolving/moderate complexity  EVALUATION COMPLEXITY: Moderate   GOALS: Goals reviewed with  patient? Yes  SHORT TERM GOALS: Target date: 06/06/2024  Patient will be independent in home exercise program to improve strength/mobility for better functional independence with ADLs. Baseline: see HEP above Goal status: MET   LONG TERM GOALS: Target date: 11/12/2024  Patient will decrease Quick DASH score by > 8 points demonstrating reduced self-reported upper extremity disability. Baseline: 88.6 / 100 = 88.6 % 07/23/24: 59.1/100 = 59.1% Goal status: INITIAL  2.  Patient will improve L shoulder AROM to > 140 degrees of flexion and abduction for improved ability to perform overhead activities. Baseline: flexion- 65 active, 120 passive; abduction- 60 active, 100 passive 07/02/24: Flexion: 54 deg active; Abduction: 48 deg Goal status: NOT PROGRESSING  3.  Pt will report worst shoulder pain of 3/10 on NPS to demonstrated improvements in pain and to allow for improved UE function Baseline: 7.5/10 07/02/24: 7/10 07/23/24: 7/10 Goal status: PROGRESSING  4.  Pt will increase L UE strength to 4+/5 or better to   Baseline: assess at future session 07/02/24: not able to assess due to pain Goal status: INITIAL  5.  Patient (> 34 years old) will complete five times sit to stand test in < 15 seconds indicating an increased LE strength and improved balance. Baseline: 29.81 sec Goal status: INITIAL   6.  Patient will increase Berg Balance score by > 6 points to demonstrate decreased fall risk during functional activities. Baseline: TBD Goal status: INITIAL   7.  Patient will reduce timed up and go to <11 seconds to reduce fall risk and demonstrate improved transfer/gait ability. Baseline: 14.51 sec Goal status: INITIAL  8.  Patient will increase 10  meter walk test to >1.27m/s as to improve gait speed for better community ambulation and to reduce fall risk. Baseline: 11.69 sec; 0.85 m/s Goal status: INITIAL  9.  Patient will increase six minute walk test distance to >1300 for progression to  community ambulator and improve gait ability. Baseline: 1185' Goal status: INITIAL  10.  Pt will improve ABC by at least 13% in order to demonstrate clinically significant improvement in balance confidence. Baseline: 24.38% Goal status: INITIAL     PLAN:  PT FREQUENCY: 2x/week  PT DURATION: 8 weeks  PLANNED INTERVENTIONS: [id number]- PT Re-evaluation, 97750- Physical Performance Testing, 97110-Therapeutic exercises, 97530- Therapeutic activity, [id number]- Neuromuscular re-education, 97535- Self Care, [id number]- Manual therapy, [id number]- Orthotic Initial, [id number]- Orthotic/Prosthetic subsequent, [id number]- Canalith repositioning, [id number]- Splinting, [id number]- Electrical stimulation (manual), [id number]- Ultrasound, [id number]- Traction (mechanical), [id number] (1-2 muscles), [id number] (3+ muscles)- Dry Needling, Patient/Family education, Balance training, Stair training, Taping, Joint mobilization, Spinal mobilization, Vestibular training, DME instructions, Cryotherapy, and Moist heat  PLAN FOR NEXT SESSION:   *** Review and Progress HEP L shoulder AAROM/AROM activities L shoulder manual therapy/joint mobilizations as tolerated/needed for pain control, isometrics Increase shoulder exercises to provide increased stability Continue with balance training to improve overall tolerance and when re-certification comes, add in additional balance related tasks for MD to sign off on.  Fonda Simpers, PT, DPT Physical Therapist - [facility]  08/28/24, 8:02 AM

## 2024-08-28 NOTE — Telephone Encounter (Signed)
 Called and spoke to pt.  Pt reports that she got her days mixed up and thought that her appointment was tomorrow 08/29/24.  Pt reminded of the next visit next week, 11/13/5 at 2 PM.  Fonda Simpers, PT, DPT Physical Therapist - [facility]  08/28/24, 11:56 AM

## 2024-09-05 ENCOUNTER — Ambulatory Visit

## 2024-09-09 ENCOUNTER — Ambulatory Visit: Admitting: Physical Therapy

## 2024-09-09 ENCOUNTER — Encounter: Payer: Self-pay | Admitting: Physical Therapy

## 2024-09-09 DIAGNOSIS — R262 Difficulty in walking, not elsewhere classified: Secondary | ICD-10-CM | POA: Diagnosis present

## 2024-09-09 DIAGNOSIS — M25612 Stiffness of left shoulder, not elsewhere classified: Secondary | ICD-10-CM | POA: Diagnosis present

## 2024-09-09 DIAGNOSIS — M6281 Muscle weakness (generalized): Secondary | ICD-10-CM | POA: Diagnosis present

## 2024-09-09 DIAGNOSIS — R2689 Other abnormalities of gait and mobility: Secondary | ICD-10-CM

## 2024-09-09 DIAGNOSIS — M25512 Pain in left shoulder: Secondary | ICD-10-CM | POA: Diagnosis present

## 2024-09-09 DIAGNOSIS — G8929 Other chronic pain: Secondary | ICD-10-CM
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
## 2024-09-11 ENCOUNTER — Ambulatory Visit: Admitting: Physical Therapy

## 2024-09-11 DIAGNOSIS — R262 Difficulty in walking, not elsewhere classified: Secondary | ICD-10-CM

## 2024-09-11 DIAGNOSIS — R2689 Other abnormalities of gait and mobility: Secondary | ICD-10-CM

## 2024-09-11 DIAGNOSIS — M6281 Muscle weakness (generalized): Secondary | ICD-10-CM

## 2024-09-11 NOTE — Therapy (Signed)
 OUTPATIENT PHYSICAL THERAPY SHOULDER/BALANCE TREATMENT  Patient Name: Roberta Lane MRN: [id number] DOB:[birth year], 68 y.o., female Today's Date: 09/11/2024  END OF SESSION:   PT End of Session - 09/11/24 1152     Visit Number 17    Number of Visits 39    Date for Recertification  11/15/23    Progress Note Due on Visit 20    PT Start Time 1102    PT Stop Time 1145    PT Time Calculation (min) 43 min    Activity Tolerance Patient tolerated treatment well;Patient limited by pain    Behavior During Therapy WFL for tasks assessed/performed           Past Medical History:  Diagnosis Date   Abdominal hernia    Allergic genetic state    Aortic ejection murmur    Ascites    Chronic hepatitis C (HCC)    Depression    Diabetes mellitus without complication (HCC)    Dysrhythmia    GERD (gastroesophageal reflux disease)    GIB (gastrointestinal bleeding)    Headache    Hepatic cirrhosis (HCC)    Hepatitis C    Hernia, abdominal    Hyperlipidemia    Hypertension    IDA (iron  deficiency anemia)    Inflamed seborrheic keratosis    Interdigital neuroma of foot    Irregular heart rhythm    Liver disease    Multiple renal cysts    Palpitations    Pancytopenia (HCC)    Portal hypertension with esophageal varices (HCC)    Splenomegaly    UTI (urinary tract infection)    Past Surgical History:  Procedure Laterality Date   ABDOMINAL HYSTERECTOMY     CHOLECYSTECTOMY     COLONOSCOPY WITH PROPOFOL  N/A 12/05/2023   Procedure: COLONOSCOPY WITH PROPOFOL ;  Surgeon: Toledo, Ladell POUR, MD;  Location: [facility];  Service: Gastroenterology;  Laterality: N/A;  DM   ESOPHAGOGASTRODUODENOSCOPY N/A 02/17/2021   Procedure: ESOPHAGOGASTRODUODENOSCOPY (EGD);  Surgeon: Toledo, Ladell POUR, MD;  Location: [facility];  Service: Gastroenterology;  Laterality: N/A;   ESOPHAGOGASTRODUODENOSCOPY (EGD) WITH PROPOFOL  N/A 11/23/2017   Procedure: ESOPHAGOGASTRODUODENOSCOPY (EGD) WITH PROPOFOL ;   Surgeon: Toledo, Ladell POUR, MD;  Location: [facility];  Service: Gastroenterology;  Laterality: N/A;   ESOPHAGOGASTRODUODENOSCOPY (EGD) WITH PROPOFOL  N/A 10/23/2018   Procedure: ESOPHAGOGASTRODUODENOSCOPY (EGD) WITH PROPOFOL ;  Surgeon: Toledo, Ladell POUR, MD;  Location: [facility];  Service: Gastroenterology;  Laterality: N/A;   ESOPHAGOGASTRODUODENOSCOPY (EGD) WITH PROPOFOL  N/A 10/10/2019   Procedure: ESOPHAGOGASTRODUODENOSCOPY (EGD) WITH PROPOFOL ;  Surgeon: Toledo, Ladell POUR, MD;  Location: [facility];  Service: Gastroenterology;  Laterality: N/A;   ESOPHAGOGASTRODUODENOSCOPY (EGD) WITH PROPOFOL  N/A 11/14/2019   Procedure: ESOPHAGOGASTRODUODENOSCOPY (EGD) WITH PROPOFOL ;  Surgeon: Toledo, Ladell POUR, MD;  Location: [facility];  Service: Gastroenterology;  Laterality: N/A;   ESOPHAGOGASTRODUODENOSCOPY (EGD) WITH PROPOFOL  N/A 12/05/2023   Procedure: ESOPHAGOGASTRODUODENOSCOPY (EGD) WITH PROPOFOL ;  Surgeon: Toledo, Ladell POUR, MD;  Location: [facility];  Service: Gastroenterology;  Laterality: N/A;   right elbow surgery     WISDOM TOOTH EXTRACTION     Patient Active Problem List   Diagnosis Date Noted   Renal mass, left 04/11/2020   Hypertension 03/18/2020   Hepatic cirrhosis due to chronic hepatitis C infection (HCC) 11/28/2019   Chronic pain of both knees 03/26/2019   Other ascites 03/26/2019   Aortic stenosis, mild 01/07/2019   Iron  deficiency anemia 12/31/2018   Hepatic encephalopathy (HCC) 07/20/2018   Frequent PVCs 06/13/2018   Portal hypertension with esophageal varices (HCC) 06/12/2018  Aortic ejection murmur 05/29/2018   Dizziness 05/29/2018   Hyperlipidemia, mixed 05/29/2018   Palpitations 05/29/2018   Controlled type 2 diabetes mellitus without complication, without long-term current use of insulin  (HCC) 05/17/2018   Abdominal pain, diffuse 12/14/2017   Gastroesophageal reflux disease without esophagitis 12/14/2017   Pancytopenia (HCC) 12/10/2017   GIB  (gastrointestinal bleeding) 11/21/2017    PCP: Lynwood Null, MD  REFERRING PROVIDER: Ozell Flake, MD  REFERRING DIAG: S46.012D (ICD-10-CM) - Strain of muscle(s) and tendon(s) of the rotator cuff of left shoulder, subsequent encounter   THERAPY DIAG:  Imbalance  Muscle weakness (generalized)  Difficulty in walking, not elsewhere classified  Rationale for Evaluation and Treatment: Rehabilitation  ONSET DATE: October 2024  SUBJECTIVE:                                                                                                                                                                                      SUBJECTIVE STATEMENT:   Pt reports to session with continued increased shoulder pain after being attacked by a dog and her dog passing away in the process. She is still upset and sad regarding this.   PERTINENT HISTORY: Pt was seen by PCP at [facility] on 04/11/24 with c/c of L shoulder pain that has progressively worsened after a fall pt sustained in October of 2024. Pt states that she lifted her 35 pound suitcase onto the shuttle bus at the airport in [place] and she caught her foot on the step of the shuttle bus and pulled her suitcase and it fell backward onto her shoulder. PCP referred pt to orthopedic surgery, who saw pt on 04/17/24. Per orthopedic surgery note from 6/25: A suspected rotator cuff tear in the left shoulder followed a fall in October. Persistent pain radiating to the forearm and thumb suggests possible nerve involvement. X-ray shows mild AC degenerative changes with slight separation, but no calcific tendonitis or significant glenohumeral degenerative change. Physical examination reveals painful and weak shoulder movements, indicating a rotator cuff tear. MRI was ordered by orthopedic surgeon, with findings suggestive of mild to moderate insertional supraspinatus tendinopathy & probable small interstitial tear to anterior insertion without a  full-thickness tear.  PMH significant for abdominal hernia, aortic ejection murmur, chronic hepatitis C, depression, DM, GERD, headache, hepatic cirrhosis, HTN, anemia, irregular heart rhythm, palpitations  Pt states that the orthopedic surgeon wants her to do 4 weeks of PT and if the pain does not improve then they will do surgery.   PAIN:  Are you having pain? Yes: NPRS scale: 7/10 currently Pain location: Anterior shoulder, into lateral deltoid Pain description: aching Aggravating factors: Reaching overhead, behind her, forward Relieving  factors: Biofreeze helped initially 09/09/24: 4/10, light throb sitting down not moving,   PRECAUTIONS: None  RED FLAGS: None   WEIGHT BEARING RESTRICTIONS: No  FALLS:  Has patient fallen in last 6 months? 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  DIAGNOSTIC FINDINGS:  MRI Shoulder Left WO Contrast (from 04/21/24):  FINDINGS: There is mild to moderate insertional tendinopathy of the supraspinatus tendon and a small interstitial tear to the anterior insertion. No retraction or full-thickness tear. Mild impingement change deep to the infraspinatus insertion. The subscapularis and teres minor tendons are unremarkable. There is mild-to-moderate tendinopathy of the intra-articular biceps tendon greater distally. Fibers are  intact.   There is no labral tear. Mild glenohumeral and acromioclavicular osteoarthritis with joint space narrowing and mild degenerative edema.   IMPRESSION: Mild to moderate insertional tendinopathy of the supraspinatus tendon and a probable small interstitial partial tear to the anterior insertion without retraction or full-thickness tear.   Mild-to-moderate tendinopathy of the intra-articular biceps tendon.   Mild glenohumeral and acromioclavicular osteoarthritis.  PATIENT SURVEYS:  QuickDASH: 88.6 / 100 = 88.6 %  COGNITION: Overall cognitive status: Within functional limits for tasks assessed     SENSATION: WFL Light touch: Pt reports that it feels duller on her L side around her shoulder  POSTURE: Slight forward head, rounded shoulders- pt holds L UE in guarded position against body at rest  CERVICAL ROM: all motions appeared WFL, pt has pain at end range extension and end range rotation to L  UPPER EXTREMITY ROM:    Active ROM Right eval Left eval  Shoulder flexion 150 65* active, 120 passive  Shoulder extension    Shoulder abduction 165 60* active, 100 passive  Shoulder adduction    Shoulder internal rotation 60 53  Shoulder external rotation 75 50  Elbow flexion    Elbow extension    Wrist flexion    Wrist extension    Wrist ulnar deviation    Wrist radial deviation    Wrist pronation    Wrist supination    (Blank rows = not tested)  UPPER EXTREMITY MMT: Unable to assess at eval due to significant pain/ROM limitations   SHOULDER SPECIAL TESTS: Rotator cuff assessment: Full can test: positive    PALPATION:  Tender on palpation of L anterior shoulder, L AC joint, Supraspinatus tendon  TREATMENT DATE: 09/11/24   TE- To improve strength, endurance, mobility, and function of specific targeted muscle groups or improve joint range of motion or  improve muscle flexibility  - Supine UT stretch with GH depression 2 x 1 min ea side, consideration of hand placement  -supine L shld add isometrics, 2x10 - 3s hold -supine L shoulder ER w/ RTB 2 x 10  Supine scap retractions 2 x 10  Seated rows, significant difficulty with form 2 x 10  Standing shoulder extensions 2 x 10   -PROM abduction, flex, ER 2-5 x ea by PT, 5s hold for stretch at point before pain increase   PATIENT EDUCATION: Education details: Pt educated on importance of maintaining movement in L shoulder for healing purposes and to prevent frozen shoulder. Pt educated on HEP and provided with full demonstration as well as cues to prevent pushing AROM passed point of severe pain Person educated: Patient Education method: Explanation, Demonstration, Verbal cues, and Handouts Education comprehension: verbalized understanding and returned demonstration  HOME EXERCISE PROGRAM: Access Code: [email or web address] URL: [email or web address] Date: 05/22/2024 Prepared by: Lonni Gainer  Exercises - Seated Shoulder Flexion Towel Slide at Table Top  - 2 x daily - 7 x weekly - 10 reps - 5 sec hold - Seated Shoulder Abduction Towel Slide at Table Top  - 2 x daily - 7 x weekly - 10 reps - 5 sec hold - Supine Isometric Shoulder Extension with Towel  - 2 x daily - 7 x weekly - 2 sets - 10 reps  ASSESSMENT:  CLINICAL IMPRESSION:   Pt shows increased pain and decreased function of L shoulder in today's session, continued with regression from her previous level, likely due to potential re-injury from dog attack recently.  Pt educated on performing movements in a pain free range, and felt improvement in pain and improved motion following but pain was still present. . Pt will continue to benefit from skilled therapy to address remaining deficits in order to improve overall QoL and return to PLOF.     OBJECTIVE IMPAIRMENTS: decreased activity tolerance, decreased mobility, decreased ROM,  decreased strength, hypomobility, impaired flexibility, impaired sensation, impaired UE functional use, improper body mechanics, postural dysfunction, and pain.   ACTIVITY LIMITATIONS: carrying, lifting, sleeping, transfers, bed mobility, bathing, toileting, dressing, self feeding, reach over head, hygiene/grooming, locomotion level, and caring for others  PARTICIPATION LIMITATIONS: meal prep, cleaning, laundry, driving, shopping, community activity, and yard work  PERSONAL FACTORS: Age, Profession, Time since onset of injury/illness/exacerbation, and 3+ comorbidities: abdominal hernia, aortic ejection murmur, chronic hepatitis C, depression, DM, GERD, headache, hepatic cirrhosis, HTN, anemia, irregular heart rhythm, palpitations are also affecting patient's functional outcome.   REHAB POTENTIAL: Good  CLINICAL DECISION MAKING: Evolving/moderate complexity  EVALUATION COMPLEXITY: Moderate   GOALS: Goals reviewed with patient? Yes  SHORT TERM GOALS: Target date: 06/06/2024  Patient will be independent in home exercise program to improve strength/mobility for better functional independence with ADLs. Baseline: see HEP above Goal status: MET   LONG TERM GOALS: Target date: 11/12/2024  Patient will decrease Quick DASH score by > 8 points demonstrating reduced self-reported upper extremity disability. Baseline: 88.6 / 100 = 88.6 % 07/23/24: 59.1/100 = 59.1% Goal status: INITIAL  2.  Patient will improve L shoulder AROM to > 140 degrees of flexion and abduction for improved ability to perform overhead activities. Baseline: flexion- 65 active, 120 passive; abduction- 60 active, 100 passive 07/02/24: Flexion: 54 deg active; Abduction: 48 deg Goal status: NOT PROGRESSING  3.  Pt will report worst shoulder pain of 3/10 on NPS to demonstrated improvements in pain and to allow for improved UE function Baseline: 7.5/10 07/02/24: 7/10 07/23/24: 7/10 Goal status: PROGRESSING  4.  Pt will increase  L UE strength to 4+/5 or better to   Baseline: assess at future session 07/02/24: not able to assess due to pain Goal status: INITIAL  5.  Patient (> 37 years old) will complete five times sit to stand test in < 15 seconds indicating an increased LE strength and improved balance. Baseline: 29.81 sec Goal status: INITIAL   6.  Patient will increase Berg Balance score by > 6 points to demonstrate decreased fall risk during functional activities. Baseline: TBD Goal status: INITIAL   7.  Patient will reduce timed up and go to <11 seconds to reduce fall risk and demonstrate improved transfer/gait ability. Baseline: 14.51 sec Goal status: INITIAL  8.  Patient will increase 10 meter walk test to >1.39m/s as to improve gait speed for better community ambulation and to reduce fall risk. Baseline: 11.69 sec; 0.85 m/s Goal status: INITIAL  9.  Patient will increase six minute walk test distance to >1300 for progression to community ambulator and improve gait ability. Baseline: 1185' Goal status: INITIAL  10.  Pt will improve ABC by at least 13% in order to demonstrate clinically significant improvement in balance confidence. Baseline: 24.38% Goal status: INITIAL     PLAN:  PT FREQUENCY: 2x/week  PT DURATION: 8 weeks  PLANNED INTERVENTIONS: [id number]- PT Re-evaluation, 97750- Physical Performance Testing, 97110-Therapeutic exercises, 97530- Therapeutic activity, [id number]- Neuromuscular re-education, 97535- Self Care, [id number]- Manual therapy, [id number]- Orthotic Initial, [id number]- Orthotic/Prosthetic subsequent, [phone]- Canalith repositioning, [id number]- Splinting, [id number]- Electrical stimulation (manual), [id number]- Ultrasound, [id number]- Traction (mechanical), [id number] (1-2 muscles), [id number] (3+ muscles)- Dry Needling, Patient/Family education, Balance training, Stair training, Taping, Joint mobilization, Spinal mobilization, Vestibular training, DME instructions, Cryotherapy, and Moist heat  PLAN FOR NEXT SESSION:    Monitor L shld pain and function following reported incident of injury Review and Progress HEP L shoulder AAROM/AROM activities L shoulder manual therapy/joint mobilizations as tolerated/needed for pain control, isometrics Increase shoulder exercises to provide increased stability Continue with balance training to improve overall tolerance and when re-certification comes, add in additional balance related tasks for MD to sign off on.  Note: Portions of this document were prepared using Dragon voice recognition software and although reviewed may contain unintentional dictation errors in syntax, grammar, or spelling.  Lonni KATHEE Gainer PT ,DPT Physical Therapist- [facility]   09/11/24, 11:54 AM

## 2024-09-16 ENCOUNTER — Ambulatory Visit

## 2024-09-18 ENCOUNTER — Encounter

## 2024-09-23 ENCOUNTER — Ambulatory Visit

## 2024-09-23 DIAGNOSIS — M25512 Pain in left shoulder: Secondary | ICD-10-CM | POA: Diagnosis present

## 2024-09-23 DIAGNOSIS — M25612 Stiffness of left shoulder, not elsewhere classified: Secondary | ICD-10-CM | POA: Insufficient documentation

## 2024-09-23 DIAGNOSIS — G8929 Other chronic pain: Secondary | ICD-10-CM | POA: Insufficient documentation

## 2024-09-23 DIAGNOSIS — M6281 Muscle weakness (generalized): Secondary | ICD-10-CM | POA: Insufficient documentation

## 2024-09-23 DIAGNOSIS — R2689 Other abnormalities of gait and mobility: Secondary | ICD-10-CM | POA: Diagnosis present

## 2024-09-23 DIAGNOSIS — R262 Difficulty in walking, not elsewhere classified: Secondary | ICD-10-CM | POA: Insufficient documentation

## 2024-09-23 NOTE — Therapy (Signed)
 OUTPATIENT PHYSICAL THERAPY SHOULDER/BALANCE TREATMENT  Patient Name: Roberta Lane MRN: [id number] DOB:1955/12/12, 68 y.o., female Today's Date: 09/23/2024  END OF SESSION:   PT End of Session - 09/23/24 1109     Visit Number 18    Number of Visits 39    Date for Recertification  11/15/23    Progress Note Due on Visit 20    PT Start Time 1104    PT Stop Time 1145    PT Time Calculation (min) 41 min    Activity Tolerance Patient tolerated treatment well;Patient limited by pain    Behavior During Therapy WFL for tasks assessed/performed          Past Medical History:  Diagnosis Date   Abdominal hernia    Allergic genetic state    Aortic ejection murmur    Ascites    Chronic hepatitis C (HCC)    Depression    Diabetes mellitus without complication (HCC)    Dysrhythmia    GERD (gastroesophageal reflux disease)    GIB (gastrointestinal bleeding)    Headache    Hepatic cirrhosis (HCC)    Hepatitis C    Hernia, abdominal    Hyperlipidemia    Hypertension    IDA (iron  deficiency anemia)    Inflamed seborrheic keratosis    Interdigital neuroma of foot    Irregular heart rhythm    Liver disease    Multiple renal cysts    Palpitations    Pancytopenia (HCC)    Portal hypertension with esophageal varices (HCC)    Splenomegaly    UTI (urinary tract infection)    Past Surgical History:  Procedure Laterality Date   ABDOMINAL HYSTERECTOMY     CHOLECYSTECTOMY     COLONOSCOPY WITH PROPOFOL  N/A 12/05/2023   Procedure: COLONOSCOPY WITH PROPOFOL ;  Surgeon: Toledo, Ladell POUR, MD;  Location: [facility];  Service: Gastroenterology;  Laterality: N/A;  DM   ESOPHAGOGASTRODUODENOSCOPY N/A 02/17/2021   Procedure: ESOPHAGOGASTRODUODENOSCOPY (EGD);  Surgeon: Toledo, Ladell POUR, MD;  Location: [facility];  Service: Gastroenterology;  Laterality: N/A;   ESOPHAGOGASTRODUODENOSCOPY (EGD) WITH PROPOFOL  N/A 11/23/2017   Procedure: ESOPHAGOGASTRODUODENOSCOPY (EGD) WITH PROPOFOL ;   Surgeon: Toledo, Ladell POUR, MD;  Location: [facility];  Service: Gastroenterology;  Laterality: N/A;   ESOPHAGOGASTRODUODENOSCOPY (EGD) WITH PROPOFOL  N/A 10/23/2018   Procedure: ESOPHAGOGASTRODUODENOSCOPY (EGD) WITH PROPOFOL ;  Surgeon: Toledo, Ladell POUR, MD;  Location: [facility];  Service: Gastroenterology;  Laterality: N/A;   ESOPHAGOGASTRODUODENOSCOPY (EGD) WITH PROPOFOL  N/A 10/10/2019   Procedure: ESOPHAGOGASTRODUODENOSCOPY (EGD) WITH PROPOFOL ;  Surgeon: Toledo, Ladell POUR, MD;  Location: [facility];  Service: Gastroenterology;  Laterality: N/A;   ESOPHAGOGASTRODUODENOSCOPY (EGD) WITH PROPOFOL  N/A 11/14/2019   Procedure: ESOPHAGOGASTRODUODENOSCOPY (EGD) WITH PROPOFOL ;  Surgeon: Toledo, Ladell POUR, MD;  Location: [facility];  Service: Gastroenterology;  Laterality: N/A;   ESOPHAGOGASTRODUODENOSCOPY (EGD) WITH PROPOFOL  N/A 12/05/2023   Procedure: ESOPHAGOGASTRODUODENOSCOPY (EGD) WITH PROPOFOL ;  Surgeon: Toledo, Ladell POUR, MD;  Location: [facility];  Service: Gastroenterology;  Laterality: N/A;   right elbow surgery     WISDOM TOOTH EXTRACTION     Patient Active Problem List   Diagnosis Date Noted   Renal mass, left 04/11/2020   Hypertension 03/18/2020   Hepatic cirrhosis due to chronic hepatitis C infection (HCC) 11/28/2019   Chronic pain of both knees 03/26/2019   Other ascites 03/26/2019   Aortic stenosis, mild 01/07/2019   Iron  deficiency anemia 12/31/2018   Hepatic encephalopathy (HCC) 07/20/2018   Frequent PVCs 06/13/2018   Portal hypertension with esophageal varices (HCC) 06/12/2018  Aortic ejection murmur 05/29/2018   Dizziness 05/29/2018   Hyperlipidemia, mixed 05/29/2018   Palpitations 05/29/2018   Controlled type 2 diabetes mellitus without complication, without long-term current use of insulin  (HCC) 05/17/2018   Abdominal pain, diffuse 12/14/2017   Gastroesophageal reflux disease without esophagitis 12/14/2017   Pancytopenia (HCC) 12/10/2017   GIB  (gastrointestinal bleeding) 11/21/2017    PCP: Lynwood Null, MD  REFERRING PROVIDER: Ozell Flake, MD  REFERRING DIAG: S46.012D (ICD-10-CM) - Strain of muscle(s) and tendon(s) of the rotator cuff of left shoulder, subsequent encounter   THERAPY DIAG:  Imbalance  Muscle weakness (generalized)  Difficulty in walking, not elsewhere classified  Chronic left shoulder pain  Decreased range of motion of shoulder, left  Other abnormalities of gait and mobility  Rationale for Evaluation and Treatment: Rehabilitation  ONSET DATE: October 2024  SUBJECTIVE:                                                                                                                                                                                      SUBJECTIVE STATEMENT:   Pt reports she is still having shoulder pain with abduction, noting 8/10 pain upon arrival.    PERTINENT HISTORY: Pt was seen by PCP at [facility] on 04/11/24 with c/c of L shoulder pain that has progressively worsened after a fall pt sustained in October of 2024. Pt states that she lifted her 35 pound suitcase onto the shuttle bus at the airport in [place] and she caught her foot on the step of the shuttle bus and pulled her suitcase and it fell backward onto her shoulder. PCP referred pt to orthopedic surgery, who saw pt on 04/17/24. Per orthopedic surgery note from 6/25: A suspected rotator cuff tear in the left shoulder followed a fall in October. Persistent pain radiating to the forearm and thumb suggests possible nerve involvement. X-ray shows mild AC degenerative changes with slight separation, but no calcific tendonitis or significant glenohumeral degenerative change. Physical examination reveals painful and weak shoulder movements, indicating a rotator cuff tear. MRI was ordered by orthopedic surgeon, with findings suggestive of mild to moderate insertional supraspinatus tendinopathy & probable small interstitial tear to  anterior insertion without a full-thickness tear.  PMH significant for abdominal hernia, aortic ejection murmur, chronic hepatitis C, depression, DM, GERD, headache, hepatic cirrhosis, HTN, anemia, irregular heart rhythm, palpitations  Pt states that the orthopedic surgeon wants her to do 4 weeks of PT and if the pain does not improve then they will do surgery.   PAIN:  Are you having pain? Yes: NPRS scale: 7/10 currently Pain location: Anterior shoulder, into lateral deltoid Pain description: aching Aggravating factors: Reaching  overhead, behind her, forward Relieving factors: Biofreeze helped initially 09/09/24: 4/10, light throb sitting down not moving,   PRECAUTIONS: None  RED FLAGS: None   WEIGHT BEARING RESTRICTIONS: No  FALLS:  Has patient fallen in last 6 months? 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  DIAGNOSTIC FINDINGS:  MRI Shoulder Left WO Contrast (from 04/21/24):  FINDINGS: There is mild to moderate insertional tendinopathy of the supraspinatus tendon and a small interstitial tear to the anterior insertion. No retraction or full-thickness tear. Mild impingement change deep to the infraspinatus insertion. The subscapularis and teres minor tendons are unremarkable. There is mild-to-moderate tendinopathy of the intra-articular biceps tendon greater  distally. Fibers are intact.   There is no labral tear. Mild glenohumeral and acromioclavicular osteoarthritis with joint space narrowing and mild degenerative edema.   IMPRESSION: Mild to moderate insertional tendinopathy of the supraspinatus tendon and a probable small interstitial partial tear to the anterior insertion without retraction or full-thickness tear.   Mild-to-moderate tendinopathy of the intra-articular biceps tendon.   Mild glenohumeral and acromioclavicular osteoarthritis.  PATIENT SURVEYS:  QuickDASH: 88.6 / 100 = 88.6 %  COGNITION: Overall cognitive status: Within functional limits for tasks assessed     SENSATION: WFL Light touch: Pt reports that it feels duller on her L side around her shoulder  POSTURE: Slight forward head, rounded shoulders- pt holds L UE in guarded position against body at rest  CERVICAL ROM: all motions appeared WFL, pt has pain at end range extension and end range rotation to L  UPPER EXTREMITY ROM:    Active ROM Right eval Left eval  Shoulder flexion 150 65* active, 120 passive  Shoulder extension    Shoulder abduction 165 60* active, 100 passive  Shoulder adduction    Shoulder internal rotation 60 53  Shoulder external rotation 75 50  Elbow flexion    Elbow extension    Wrist flexion    Wrist extension    Wrist ulnar deviation    Wrist radial deviation    Wrist pronation    Wrist supination    (Blank rows = not tested)  UPPER EXTREMITY MMT: Unable to assess at eval due to significant pain/ROM limitations   SHOULDER SPECIAL TESTS: Rotator cuff assessment: Full can test: positive    PALPATION:  Tender on palpation of L anterior shoulder, L AC joint, Supraspinatus tendon  TREATMENT DATE: 09/23/24  TE- To improve strength, endurance, mobility, and function of specific targeted muscle groups or improve joint  range of motion or improve muscle flexibility  Standing shoulder extension isometric, 3 sec hold, 2x10 Standing shoulder adduction isometric, 3 sec hold, 2x10 Standing scapular retractions with 17.5# at cable column, 2x10    Manual:  Supine STM to L shoulder including UT, deltoid, biceps, and subscapularis for pain modulation Supine PROM of the L shoulder in all planes of mobility, with holds in end range, specifically with ER and abduction for pain modulation Supine UT stretch, 30 sec holds, x2 each side    PATIENT EDUCATION: Education details: Pt educated on importance of maintaining movement in L shoulder for healing purposes and to prevent frozen shoulder. Pt educated on HEP and provided with full demonstration as well as cues to prevent pushing AROM passed point of severe pain Person educated: Patient Education method: Explanation, Demonstration, Verbal cues, and Handouts Education comprehension: verbalized understanding and returned demonstration  HOME EXERCISE PROGRAM: Access Code: [email or web address] URL: [email or web address] Date: 05/22/2024 Prepared by: Lonni Gainer  Exercises - Seated Shoulder Flexion Towel Slide at Table Top  - 2 x daily - 7 x weekly - 10 reps - 5 sec hold - Seated Shoulder Abduction Towel Slide at Table Top  - 2 x daily - 7 x weekly - 10 reps - 5 sec hold - Supine Isometric Shoulder Extension with Towel  - 2 x daily - 7 x weekly - 2 sets - 10 reps  ASSESSMENT:  CLINICAL IMPRESSION:   Pt responded well to the stretches and exercises and noted to have increased ROM after the manual therapy approach.  Pt still having some pain with active shoulder abduction, but was able to progress with reduced pain at the end of the session.  Pt advised to continue performing the stretches at home to improve overall ROM and pain modulation.   OBJECTIVE IMPAIRMENTS: decreased activity tolerance, decreased mobility, decreased ROM, decreased strength,  hypomobility, impaired flexibility, impaired sensation, impaired UE functional use, improper body mechanics, postural dysfunction, and pain.   ACTIVITY LIMITATIONS: carrying, lifting, sleeping, transfers, bed mobility, bathing, toileting, dressing, self feeding, reach over head, hygiene/grooming, locomotion level, and caring for others  PARTICIPATION LIMITATIONS: meal prep, cleaning, laundry, driving, shopping, community activity, and yard work  PERSONAL FACTORS: Age, Profession, Time since onset of injury/illness/exacerbation, and 3+ comorbidities: abdominal hernia, aortic ejection murmur, chronic hepatitis C, depression, DM, GERD, headache, hepatic cirrhosis, HTN, anemia, irregular heart rhythm, palpitations are also affecting patient's functional outcome.   REHAB POTENTIAL: Good  CLINICAL DECISION MAKING: Evolving/moderate complexity  EVALUATION COMPLEXITY: Moderate   GOALS: Goals reviewed with patient? Yes  SHORT TERM GOALS: Target date: 06/06/2024  Patient will be independent in home exercise program to improve strength/mobility for better functional independence with ADLs. Baseline: see HEP above Goal status: MET   LONG TERM GOALS: Target date: 11/12/2024  Patient will decrease Quick DASH score by > 8 points demonstrating reduced self-reported upper extremity disability. Baseline: 88.6 / 100 = 88.6 % 07/23/24: 59.1/100 = 59.1% Goal status: INITIAL  2.  Patient will improve L shoulder AROM to > 140 degrees of flexion and abduction for improved ability to perform overhead activities. Baseline: flexion- 65 active, 120 passive; abduction- 60 active, 100 passive 07/02/24: Flexion: 54 deg active; Abduction: 48 deg Goal status: NOT PROGRESSING  3.  Pt will report worst shoulder pain of 3/10 on NPS to demonstrated improvements in pain and to allow for improved UE  function Baseline: 7.5/10 07/02/24: 7/10 07/23/24: 7/10 Goal status: PROGRESSING  4.  Pt will increase L UE strength to  4+/5 or better to   Baseline: assess at future session 07/02/24: not able to assess due to pain Goal status: INITIAL  5.  Patient (> 36 years old) will complete five times sit to stand test in < 15 seconds indicating an increased LE strength and improved balance. Baseline: 29.81 sec Goal status: INITIAL   6.  Patient will increase Berg Balance score by > 6 points to demonstrate decreased fall risk during functional activities. Baseline: TBD Goal status: INITIAL   7.  Patient will reduce timed up and go to <11 seconds to reduce fall risk and demonstrate improved transfer/gait ability. Baseline: 14.51 sec Goal status: INITIAL  8.  Patient will increase 10 meter walk test to >1.75m/s as to improve gait speed for better community ambulation and to reduce fall risk. Baseline: 11.69 sec; 0.85 m/s Goal status: INITIAL  9.  Patient will increase six minute walk test distance to >1300 for progression to community ambulator and improve gait ability. Baseline: 1185' Goal status: INITIAL  10.  Pt will improve ABC by at least 13% in order to demonstrate clinically significant improvement in balance confidence. Baseline: 24.38% Goal status: INITIAL     PLAN:  PT FREQUENCY: 2x/week  PT DURATION: 8 weeks  PLANNED INTERVENTIONS: [id number]- PT Re-evaluation, 97750- Physical Performance Testing, 97110-Therapeutic exercises, 97530- Therapeutic activity, [id number]- Neuromuscular re-education, 97535- Self Care, [id number]- Manual therapy, [id number]- Orthotic Initial, [id number]- Orthotic/Prosthetic subsequent, [phone]- Canalith repositioning, [id number]- Splinting, [id number]- Electrical stimulation (manual), [id number]- Ultrasound, [id number]- Traction (mechanical), [phone] (1-2 muscles), [id number] (3+ muscles)- Dry Needling, Patient/Family education, Balance training, Stair training, Taping, Joint mobilization, Spinal mobilization, Vestibular training, DME instructions, Cryotherapy, and Moist heat  PLAN FOR NEXT SESSION:   Monitor L shld pain and  function following reported incident of injury Review and Progress HEP L shoulder AAROM/AROM activities L shoulder manual therapy/joint mobilizations as tolerated/needed for pain control, isometrics Increase shoulder exercises to provide increased stability Continue with balance training to improve overall tolerance and when re-certification comes, add in additional balance related tasks for MD to sign off on.    Fonda Simpers, PT, DPT Physical Therapist - [facility]  09/23/24, 11:53 AM

## 2024-09-24 ENCOUNTER — Ambulatory Visit: Admitting: Physical Therapy

## 2024-09-26 ENCOUNTER — Ambulatory Visit

## 2024-09-26 DIAGNOSIS — R262 Difficulty in walking, not elsewhere classified: Secondary | ICD-10-CM

## 2024-09-26 DIAGNOSIS — M25612 Stiffness of left shoulder, not elsewhere classified: Secondary | ICD-10-CM

## 2024-09-26 DIAGNOSIS — M6281 Muscle weakness (generalized): Secondary | ICD-10-CM

## 2024-09-26 DIAGNOSIS — R2689 Other abnormalities of gait and mobility: Secondary | ICD-10-CM | POA: Diagnosis not present

## 2024-09-26 DIAGNOSIS — G8929 Other chronic pain: Secondary | ICD-10-CM

## 2024-09-26 NOTE — Therapy (Addendum)
 OUTPATIENT PHYSICAL THERAPY REEVALUATION  Patient Name: Roberta Lane MRN: [id number] DOB:01-Mar-1956, 68 y.o., female Today's Date: 09/26/2024  PCP: Roberta Lynwood FALCON, MD  REFERRING PROVIDER: Valora Lynwood FALCON, MD   END OF SESSION:  PT End of Session - 09/26/24 1533     Visit Number 1    Number of Visits 24    Date for Recertification  12/19/24    Authorization Type Medicare/ BCBS secondary    Authorization Time Period 09/26/24-12/19/24    Progress Note Due on Visit 10    PT Start Time 1530    PT Stop Time 1610    PT Time Calculation (min) 40 min    Equipment Utilized During Treatment Gait belt    Activity Tolerance Patient tolerated treatment well;No increased pain    Behavior During Therapy WFL for tasks assessed/performed          Past Medical History:  Diagnosis Date   Abdominal hernia    Allergic genetic state    Aortic ejection murmur    Ascites    Chronic hepatitis C (HCC)    Depression    Diabetes mellitus without complication (HCC)    Dysrhythmia    GERD (gastroesophageal reflux disease)    GIB (gastrointestinal bleeding)    Headache    Hepatic cirrhosis (HCC)    Hepatitis C    Hernia, abdominal    Hyperlipidemia    Hypertension    IDA (iron  deficiency anemia)    Inflamed seborrheic keratosis    Interdigital neuroma of foot    Irregular heart rhythm    Liver disease    Multiple renal cysts    Palpitations    Pancytopenia (HCC)    Portal hypertension with esophageal varices (HCC)    Splenomegaly    UTI (urinary tract infection)    Past Surgical History:  Procedure Laterality Date   ABDOMINAL HYSTERECTOMY     CHOLECYSTECTOMY     COLONOSCOPY WITH PROPOFOL  N/A 12/05/2023   Procedure: COLONOSCOPY WITH PROPOFOL ;  Surgeon: Toledo, Ladell POUR, MD;  Location: [facility];  Service: Gastroenterology;  Laterality: N/A;  DM   ESOPHAGOGASTRODUODENOSCOPY N/A 02/17/2021   Procedure: ESOPHAGOGASTRODUODENOSCOPY (EGD);  Surgeon: Toledo, Ladell POUR, MD;   Location: [facility];  Service: Gastroenterology;  Laterality: N/A;   ESOPHAGOGASTRODUODENOSCOPY (EGD) WITH PROPOFOL  N/A 11/23/2017   Procedure: ESOPHAGOGASTRODUODENOSCOPY (EGD) WITH PROPOFOL ;  Surgeon: Toledo, Ladell POUR, MD;  Location: [facility];  Service: Gastroenterology;  Laterality: N/A;   ESOPHAGOGASTRODUODENOSCOPY (EGD) WITH PROPOFOL  N/A 10/23/2018   Procedure: ESOPHAGOGASTRODUODENOSCOPY (EGD) WITH PROPOFOL ;  Surgeon: Toledo, Ladell POUR, MD;  Location: [facility];  Service: Gastroenterology;  Laterality: N/A;   ESOPHAGOGASTRODUODENOSCOPY (EGD) WITH PROPOFOL  N/A 10/10/2019   Procedure: ESOPHAGOGASTRODUODENOSCOPY (EGD) WITH PROPOFOL ;  Surgeon: Toledo, Ladell POUR, MD;  Location: [facility];  Service: Gastroenterology;  Laterality: N/A;   ESOPHAGOGASTRODUODENOSCOPY (EGD) WITH PROPOFOL  N/A 11/14/2019   Procedure: ESOPHAGOGASTRODUODENOSCOPY (EGD) WITH PROPOFOL ;  Surgeon: Toledo, Ladell POUR, MD;  Location: [facility];  Service: Gastroenterology;  Laterality: N/A;   ESOPHAGOGASTRODUODENOSCOPY (EGD) WITH PROPOFOL  N/A 12/05/2023   Procedure: ESOPHAGOGASTRODUODENOSCOPY (EGD) WITH PROPOFOL ;  Surgeon: Toledo, Ladell POUR, MD;  Location: [facility];  Service: Gastroenterology;  Laterality: N/A;   right elbow surgery     WISDOM TOOTH EXTRACTION     Patient Active Problem List   Diagnosis Date Noted   Renal mass, left 04/11/2020   Hypertension 03/18/2020   Hepatic cirrhosis due to chronic hepatitis C infection (HCC) 11/28/2019   Chronic pain of both knees 03/26/2019   Other  ascites 03/26/2019   Aortic stenosis, mild 01/07/2019   Iron  deficiency anemia 12/31/2018   Hepatic encephalopathy (HCC) 07/20/2018   Frequent PVCs 06/13/2018   Portal hypertension with esophageal varices (HCC) 06/12/2018   Aortic ejection murmur 05/29/2018   Dizziness 05/29/2018   Hyperlipidemia, mixed 05/29/2018   Palpitations 05/29/2018   Controlled type 2 diabetes mellitus without complication, without  long-term current use of insulin  (HCC) 05/17/2018   Abdominal pain, diffuse 12/14/2017   Gastroesophageal reflux disease without esophagitis 12/14/2017   Pancytopenia (HCC) 12/10/2017   GIB (gastrointestinal bleeding) 11/21/2017    ONSET DATE: Left shoulder injury September 2025; Imbalance Mid 2024 (gradual progressive onset)    REFERRING DIAG: Left shoulder supraspinatus tear; neuropathy imbalance   THERAPY DIAG:  Imbalance  Muscle weakness (generalized)  Difficulty in walking, not elsewhere classified  Chronic left shoulder pain  Decreased range of motion of shoulder, left  Rationale for Evaluation and Treatment: Rehabilitation   SUBJECTIVE:                                                                                                                                                                                             SUBJECTIVE STATEMENT: Pt reports no falls since prior visit, but her shoulder pain has fluctuated with the cold air, particularly since exacerbated by her recent fall.   PERTINENT HISTORY:   Roberta Lane is a 68yoF who is referred to OPPT for 2 new problems: pt referred from orthopedics Dr. Marchia regarding a fall in Sept 2024 c subsequent shoulder pain, MRI findings later revealing supraspinatus partial thickness tear. The second referral for imbalance, gait instability comes from PCP Dr. Valora after new onset falls x3-4 since Sept 2024- pt referred to Dr. Lane neurology late 2025 where she is found to have BLE neuropathy from knees down to toes. At baseline pt is fully independent, AMB freely in the community, used to walk her late dog regularly (traumatic passing in Oct 2025).   PROBLEM 1: Shoulder pain/supraspinatus tear Pt sustained a fall in Sept 2024 while stepping onto a bus landing on left shoulder, persistent pain in Left shoulder/forearm, thumb, ultimately seen first for this by PCP, then by orthopedics Dr. Kathlynn 04/17/24, xray showing  slight A/C joint separation and exam suggestive of rotator cuff injury, subsequent MRI 04/21/24 revealing of supraspinatus tendinopathy/partial tear, moderate biceps tendinopathy. Ortho recommended 4 weeks of PT at that time. Pt started OPPT here on 05/16/24, initial Quickdash 88.6%. Second fall on 06/29/24 landing on left shoulder again. PT recommended ortho FU and pt took 3 weeks off from PT, returning on 07/23/24 after orthopedic clearance and reassuring imaging/corticosteroid  Rx. Pt felts that she was making progress with shoulder until she fell, and desired to regain these improvements. 2nd week November 2025, pt's dog was attacked/killed by a pit bull and pt hurt her Left shoulder again while trying to separate the dogs.    PROBLEM 2: History of falls/neuropathy  First fall in September 2024, then second fall on 07/02/24 landing on left shoulder again, FU with PCP on 9/18 asking for PT referral for balance issues >1 year. Pt told PCP and hepatology about occasional dizziness, particularly if standing to quickly, however no falls occurring during these episodes. PCP also recommending neurology consult. Pt returned to PT 07/23/24 with new consult from PCP, TUG: 14.51sec, 29.81sec, : 11.69sec, : 1139ft, ABC scale: 24.38%. She experiences occasional imbalance, tripping over her feet, and has a fear of escalators. She sometimes feels dizzy or lightheaded, particularly when standing up quickly. She has a history of diabetes, with a well-controlled A1c of 5.4 as of last A1C 05/2023.  PAIN:  Are you having pain? 2/10 at rest, left lateral shoulder (worse with A/ROM or functional use); In past week, highest pain: 8/10 (09/26/24)    PRECAUTIONS: None  WEIGHT BEARING RESTRICTIONS: No  FALLS HISTORY:  2 falls since Sept 2024; once while stepping onto an airport shuttle bus, fell backwards and landed on Left shoudler; 2nd when stepping down a curb unaware of its presence and falling onto left shoulder.    LIVING ENVIRONMENT: Lives with: lives alone, neighbors able to stop by if she needs them Lives in: House/apartment Stairs: Yes: Internal: 14 steps; railing on left going up Has following equipment at home: Shower bench   OCCUPATION: Retired- spends her days watching tv   PLOF: Independent  PATIENT GOALS:   1. Shoulder: have less pain in general, be able to freely use BUE for tasks like opening jars   2. Balance: avoid any additional falls, be more confident with curbs, steps  OBJECTIVE:  Note: Objective measures were completed at Evaluation unless otherwise noted.  DIAGNOSTIC FINDINGS:  07/12/24:  Note from Dr. Marchia, [facility]: MRI of the left shoulder was reviewed, showing a high-grade partial tear of the supraspinatus tendon, measuring 3 to 6 millimeters. Independent interpretation performed by me confirms the partial tear without full detachment.(...)  I recommended non-operative management at this time. A prednisone taper was prescribed to manage inflammation and pain, with instructions to monitor blood sugar levels due to her diabetes. If symptoms do not improve, a steroid injection may be considered. The patient was encouraged to return to therapy and was provided with a new referral.  08/26/24 Note from Dr. 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 Referral to PT for gait and balance.  COGNITION: Overall cognitive status: WNL  VITAL SIGNS:  09/26/24:  -132/85mmHg 69bpm seated  -132/4mmHg 74bpm standing (no dizziness symptoms)    EDEMA:  Ankle edema bilat, focal to the ATFL area, pt reports as chronic.   ROM ASSESSMENT: *LE not assessed at evaluation, however pt remarks on weakness/pain with seated Right hip flexion needed for car transfers.   -pt demonstrates standing:  hip flexion: fatigue/pain on right 1x10 bilat c chair support shows impaired excursion of height on both sides and progressive loss of height over 8 reps on right side.  *warrants closer assessment of Rt hip to rule out joint disease,  neurological weakness, etc. Also reports intermittent knee giving way.   -Rt knee with mild degenerative valgus, however pt denies typical pain issues.   UPPER EXTREMITY ROM:     Active ROM Right 05/16/24 Left 05/16/24  Shoulder flexion 150 65* active, 120 passive  Shoulder abduction 165 60* active, 100 passive  Shoulder internal rotation 60 53  Shoulder external rotation 75 50   MMT BLE 09/27/24:  -Ankle DF: 5/5 bilat -Ankle eversion: 4+/5 bilat c soreness on Rt (lateral ankle edema bilat)  -Ankle inversion: 5/5 bilat  -Ankle PF: 30% ROM rise single LLE; 0% ROM rise on RLE; interpretation: severe strength impairment. -Hip ER: 5/5 bilat  -Hip IR: 5/5 bilat   BALANCE SCREENING 09/27/24: -60sec narrow stance firm surface: low sway  -60sec narrow stance foam: heavy sway, no LOB  -normal stance on incline eyes closed: ~10 sec with posterior sway and LOB  -SLS: >10SEC bilat  TRANSFERS: 07/23/24: 5xSTS: 29.81sec   GAIT:  FUNCTIONAL TESTS:  07/23/24 TUG: 14.51 5xSTS: 29.81 : 11.69sec : 1180ft  PATIENT SURVEYS:  05/16/24: Quick DASH: 88.6% 07/23/24: Abc scale 24.38%                                                                                                                             TREATMENT DATE 09/27/24:   -Orthostatic vital signs -MMT bilat ankles  -60sec narrow stance firm surface: low sway  -60sec narrow stance foam: heavy sway, no LOB  -normal stance on incline eyes closed: ~10 sec with posterior sway and LOB  -SLS: >10SEC bilat  PATIENT EDUCATION: Education details: Relevance of sway variations in testing, specificity to incline imbalance to neuropathy diagnosis.  Person educated: Product/process Development Scientist method: Discussion   Education comprehension: Good   HOME EXERCISE PROGRAM: Access Code: [email or web address] URL: [email or web address] Date: 05/22/2024 Prepared by: Lonni Gainer   Exercises - Seated Shoulder Flexion Towel Slide at Table Top  - 2 x daily - 7 x weekly - 10 reps - 5 sec hold - Seated Shoulder Abduction Towel Slide at Table Top  - 2 x daily - 7 x weekly - 10 reps - 5 sec hold - Supine Isometric Shoulder Extension with Towel  - 2 x daily - 7 x weekly - 2 sets - 10 reps  Access Code: [email or web address] URL: [email or web address] Date: 09/26/2024 Prepared by: Peggye Linear  Exercises - Heel Raises with Counter Support  - 1 x daily - 7 x weekly - 3 sets - 15 reps - Standing March with Counter Support  - 1 x daily - 7 x weekly - 3 sets - 10 reps  GOALS: Goals reviewed with patient? Yes  SHORT TERM GOALS: Target date: 11/18/24  Patient will decrease Quick DASH score to less than 25% demonstrating reduced self-reported upper extremity disability. Baseline: 88.6 %; 07/23/24: 59.1% Goal status: NEW   2.  Patient will demonstrate L shoulder AROM in flexion and ABD > 90 degrees for improved ability to perform  overhead activities of ADL and home care. Baseline: flexion- 65 active, abduction- 60 active; 07/02/24: Flexion: 54 deg; Abduction: 48 deg Goal status: NEW  3.  Pt to report independence and compliance with basic HEP for Left shoulder strength, pain management, and balance performance.  Baseline: HEP in place, compliance to follow Goal status: NEW  4.  Pt to demonstrate 5xSTS <18sec and >1224ft to improve baseline cardiorespiratory function and LE power closer to population norm values in order to maximize reaction time and strength for improved LOB recovery and cognitive wellness.  Baseline: 5xSTS: 29.81 sec, : 1149ft Goal status: NEW  5.  Pt to improve balance confidence AEB >50% improvement on ABC Scale. Baseline: 24.38%  Goal status: NEW  LONG TERM GOALS:  Target date: 12/20/23  Pt to demonstrate 5/5 strength in left shoulder extension, ABDCT, ER, IR, and 4/5 left shoulder flexion <3/10 pain.  Baseline:  Goal status: INITIAL  2.  Patient will demonstrate L shoulder AROM in flexion and ABD > 125 degrees for improved ability to perform overhead activities of ADL and home care. Baseline: flexion- 65 active, abduction- 60 active; 07/02/24: Flexion: 54 deg; Abduction: 48 deg Goal status: NEW  3.  Pt to report independence and compliance with advanced, gym or home based exercise regimen for BUE strength, BLE strength, and cardio fitness for continued progress prior to and post discharge.  Baseline: HEP in place, compliance to follow Goal status: NEW Baseline:  Goal status: INITIAL  4.  Pt to demonstrate 5xSTS <12sec and >1571ft to improve baseline cardiorespiratory function and LE power closer to population norm values in order to maximize reaction time and strength for improved LOB recovery and cognitive wellness.  Baseline: 5xSTS: 29.81 sec, : 1149ft Goal status: NEW  5.  Pt to demonstrate ability to perform eyes closed narrow stance balance on foam surface at modI level 3x60sec and eyes closed firm surface inclined stance 3x30sec at modI level.  Baseline: minguard to minA and <10sec respectively;  Goal status: INITIAL  ASSESSMENT:  CLINICAL IMPRESSION: 68yoF who has been followed by our services for several weeks now for Left shoulder injury, now in 2nd recent exacerbation and injury and started on some balance interventions, formally evaluated in-depth for persistent imbalance and persistent falls anxiety. Exam pt has made good progress with shoulder pain functional activity tolerance, but has succumbed to repeated events of exacerbation, however pt remains a good candidate for improvement in ROM and motor function overall. Balance assessment gives more specific picture of particular motor deficits and sensory deficits in the ankles that  are contributing to limitations of postural control. Pt shows good proprioceptive and righting function at the level of hips and trunk, however also shows signs of aberrant Rt hip and Rt knee function not fully examined on today's visit. Pt's values for 5xSTS and remain far off population normative values which independent of neuropathy may be adding an easily modifiable secondary set of risk factors for imbalance. Patient will benefit from skilled physical therapy intervention to reduce deficits and impairments identified in evaluation, in order to reduce pain, improve quality of life, and maximize activity tolerance for ADL, IADL, and leisure/fitness. Physical therapy will help pt achieve long and short term goals of care.    OBJECTIVE IMPAIRMENTS: Decreased knowledge of condition, decreased use of DME, decreased mobility, difficulty walking, decreased strength, decreased ROM. ACTIVITY LIMITATIONS: Lifting, standing, walking, squatting, transfers, locomotion level PARTICIPATION LIMITATIONS: Cleaning, laundry, interpersonal relationships, driving, yardwork, community activity.  PERSONAL FACTORS:  Age, behavior pattern, education, past/current experiences, transportation, profession  are also affecting patient's functional outcome.  REHAB POTENTIAL: Good CLINICAL DECISION MAKING: Medium  EVALUATION COMPLEXITY: Moderate    PLAN:  PT FREQUENCY: 1-2x/week   PT DURATION: 12 weeks   PLANNED INTERVENTIONS: 97164- PT Re-evaluation, 97750- Physical Performance Testing, 97110-Therapeutic exercises, 97530- Therapeutic activity, 97112- Neuromuscular re-education, 97535- Self Care, [id number]- Manual therapy, [phone]- Gait training, [phone]- Electrical stimulation (unattended), [phone]- Electrical stimulation (manual), Patient/Family education, Balance training, Stair training, Joint mobilization, Joint manipulation, Cryotherapy, and Moist heat  PLAN FOR NEXT SESSION:  Reassess A/ROM Flexion/ABDCT Left shoulder   Updated Quick DASH Updated ABC Scale Updated  Updated 5xSTS  FU on New HEP items from last visit    Savera Donson C, PT 09/26/2024, 3:39 PM  3:50 PM, 09/27/24 Peggye JAYSON Linear, PT, DPT Physical Therapist - [facility]  [phone] [facility])

## 2024-09-30 ENCOUNTER — Ambulatory Visit

## 2024-09-30 NOTE — Therapy (Incomplete)
 OUTPATIENT PHYSICAL THERAPY REEVALUATION  Patient Name: Roberta Roberta Lane MRN: [id number] DOB:10-02-1956, 68 y.o., female Today's Date: 09/30/2024  PCP: Roberta Lynwood FALCON, MD  REFERRING PROVIDER: Valora Lynwood FALCON, MD   END OF SESSION:    Past Medical History:  Diagnosis Date   Abdominal hernia    Allergic genetic state    Aortic ejection murmur    Ascites    Chronic hepatitis C (HCC)    Depression    Diabetes mellitus without complication (HCC)    Dysrhythmia    GERD (gastroesophageal reflux disease)    GIB (gastrointestinal bleeding)    Headache    Hepatic cirrhosis (HCC)    Hepatitis C    Hernia, abdominal    Hyperlipidemia    Hypertension    IDA (iron  deficiency anemia)    Inflamed seborrheic keratosis    Interdigital neuroma of foot    Irregular heart rhythm    Liver disease    Multiple renal cysts    Palpitations    Pancytopenia (HCC)    Portal hypertension with esophageal varices (HCC)    Splenomegaly    UTI (urinary tract infection)    Past Surgical History:  Procedure Laterality Date   ABDOMINAL HYSTERECTOMY     CHOLECYSTECTOMY     COLONOSCOPY WITH PROPOFOL  N/A 12/05/2023   Procedure: COLONOSCOPY WITH PROPOFOL ;  Surgeon: Roberta Lane, Roberta POUR, MD;  Location: [facility];  Service: Gastroenterology;  Laterality: N/A;  DM   ESOPHAGOGASTRODUODENOSCOPY N/A 02/17/2021   Procedure: ESOPHAGOGASTRODUODENOSCOPY (EGD);  Surgeon: Roberta Lane, Roberta POUR, MD;  Location: [facility];  Service: Gastroenterology;  Laterality: N/A;   ESOPHAGOGASTRODUODENOSCOPY (EGD) WITH PROPOFOL  N/A 11/23/2017   Procedure: ESOPHAGOGASTRODUODENOSCOPY (EGD) WITH PROPOFOL ;  Surgeon: Roberta Lane, Roberta POUR, MD;  Location: [facility];  Service: Gastroenterology;  Laterality: N/A;   ESOPHAGOGASTRODUODENOSCOPY (EGD) WITH PROPOFOL  N/A 10/23/2018   Procedure: ESOPHAGOGASTRODUODENOSCOPY (EGD) WITH PROPOFOL ;  Surgeon: Roberta Lane, Roberta POUR, MD;  Location: [facility];  Service: Gastroenterology;   Laterality: N/A;   ESOPHAGOGASTRODUODENOSCOPY (EGD) WITH PROPOFOL  N/A 10/10/2019   Procedure: ESOPHAGOGASTRODUODENOSCOPY (EGD) WITH PROPOFOL ;  Surgeon: Roberta Lane, Roberta POUR, MD;  Location: [facility];  Service: Gastroenterology;  Laterality: N/A;   ESOPHAGOGASTRODUODENOSCOPY (EGD) WITH PROPOFOL  N/A 11/14/2019   Procedure: ESOPHAGOGASTRODUODENOSCOPY (EGD) WITH PROPOFOL ;  Surgeon: Roberta Lane, Roberta POUR, MD;  Location: [facility];  Service: Gastroenterology;  Laterality: N/A;   ESOPHAGOGASTRODUODENOSCOPY (EGD) WITH PROPOFOL  N/A 12/05/2023   Procedure: ESOPHAGOGASTRODUODENOSCOPY (EGD) WITH PROPOFOL ;  Surgeon: Roberta Lane, Roberta POUR, MD;  Location: [facility];  Service: Gastroenterology;  Laterality: N/A;   right elbow surgery     WISDOM TOOTH EXTRACTION     Patient Active Problem List   Diagnosis Date Noted   Renal mass, left 04/11/2020   Hypertension 03/18/2020   Hepatic cirrhosis due to chronic hepatitis C infection (HCC) 11/28/2019   Chronic pain of both knees 03/26/2019   Other ascites 03/26/2019   Aortic stenosis, mild 01/07/2019   Iron  deficiency anemia 12/31/2018   Hepatic encephalopathy (HCC) 07/20/2018   Frequent PVCs 06/13/2018   Portal hypertension with esophageal varices (HCC) 06/12/2018   Aortic ejection murmur 05/29/2018   Dizziness 05/29/2018   Hyperlipidemia, mixed 05/29/2018   Palpitations 05/29/2018   Controlled type 2 diabetes mellitus without complication, without long-term current use of insulin  (HCC) 05/17/2018   Abdominal pain, diffuse 12/14/2017   Gastroesophageal reflux disease without esophagitis 12/14/2017   Pancytopenia (HCC) 12/10/2017   GIB (gastrointestinal bleeding) 11/21/2017    ONSET DATE: Left shoulder injury September 2025; Imbalance Mid 2024 (gradual progressive onset)  REFERRING DIAG: Left shoulder supraspinatus tear; neuropathy imbalance   THERAPY DIAG:  No diagnosis found.  Rationale for Evaluation and Treatment: Rehabilitation    SUBJECTIVE:                                                                                                                                                                                             SUBJECTIVE STATEMENT:  ***  PERTINENT HISTORY:   Roberta Roberta Lane is a 68yoF who is referred to OPPT for 2 new problems: pt referred from orthopedics Dr. Marchia regarding a fall in Sept 2024 c subsequent shoulder pain, MRI findings later revealing supraspinatus partial thickness tear. The second referral for imbalance, gait instability comes from PCP Dr. Valora after new onset falls x3-4 since Sept 2024- pt referred to Dr. Lane neurology late 2025 where she is found to have BLE neuropathy from knees down to toes. At baseline pt is fully independent, AMB freely in the community, used to walk her late dog regularly (traumatic passing in Oct 2025).   PROBLEM 1: Shoulder pain/supraspinatus tear Pt sustained a fall in Sept 2024 while stepping onto a bus landing on left shoulder, persistent pain in Left shoulder/forearm, thumb, ultimately seen first for this by PCP, then by orthopedics Dr. Kathlynn 04/17/24, xray showing slight A/C joint separation and exam suggestive of rotator cuff injury, subsequent MRI 04/21/24 revealing of supraspinatus tendinopathy/partial tear, moderate biceps tendinopathy. Ortho recommended 4 weeks of PT at that time. Pt started OPPT here on 05/16/24, initial Quickdash 88.6%. Second fall on 06/29/24 landing on left shoulder again. PT recommended ortho FU and pt took 3 weeks off from PT, returning on 07/23/24 after orthopedic clearance and reassuring imaging/corticosteroid Rx. Pt felts that she was making progress with shoulder until she fell, and desired to regain these improvements. 2nd week November 2025, pt's dog was attacked/killed by a pit bull and pt hurt her Left shoulder again while trying to separate the dogs.    PROBLEM 2: History of falls/neuropathy  First fall in September  2024, then second fall on 07/02/24 landing on left shoulder again, FU with PCP on 9/18 asking for PT referral for balance issues >1 year. Pt told PCP and hepatology about occasional dizziness, particularly if standing to quickly, however no falls occurring during these episodes. PCP also recommending neurology consult. Pt returned to PT 07/23/24 with new consult from PCP, TUG: 14.51sec, 29.81sec, : 11.69sec, : 113ft, ABC scale: 24.38%. She experiences occasional imbalance, tripping over her feet, and has a fear of escalators. She sometimes feels dizzy or lightheaded, particularly when standing up quickly. She has a history of diabetes, with  a well-controlled A1c of 5.4 as of last A1C 05/2023.  PAIN:  Are you having pain? 2/10 at rest, left lateral shoulder (worse with A/ROM or functional use); In past week, highest pain: 8/10 (09/26/24)    PRECAUTIONS: None  WEIGHT BEARING RESTRICTIONS: No  FALLS HISTORY:  2 falls since Sept 2024; once while stepping onto an airport shuttle bus, fell backwards and landed on Left shoudler; 2nd when stepping down a curb unaware of its presence and falling onto left shoulder.   LIVING ENVIRONMENT: Lives with: lives alone, neighbors able to stop by if she needs them Lives in: House/apartment Stairs: Yes: Internal: 14 steps; railing on left going up Has following equipment at home: Shower bench   OCCUPATION: Retired- spends her days watching tv   PLOF: Independent  PATIENT GOALS:   1. Shoulder: have less pain in general, be able to freely use BUE for tasks like opening jars   2. Balance: avoid any additional falls, be more confident with curbs, steps  OBJECTIVE:  Note: Objective measures were completed at Evaluation unless otherwise noted.  DIAGNOSTIC FINDINGS:  07/12/24:  Note from Dr. Marchia, [facility]: MRI of the left shoulder was reviewed, showing a high-grade partial tear of the supraspinatus tendon, measuring 3 to 6 millimeters.  Independent interpretation performed by me confirms the partial tear without full detachment.(...)  I recommended non-operative management at this time. A prednisone taper was prescribed to manage inflammation and pain, with instructions to monitor blood sugar levels due to her diabetes. If symptoms do not improve, a steroid injection may be considered. The patient was encouraged to return to therapy and was provided with a new referral.  08/26/24 Note from Dr. 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 Referral to PT for gait and balance.  COGNITION: Overall cognitive status: WNL  VITAL SIGNS:  09/26/24:  -132/67mmHg 69bpm seated  -132/46mmHg 74bpm standing (no dizziness symptoms)    EDEMA:  Ankle edema bilat, focal to the ATFL area, pt reports as chronic.   ROM ASSESSMENT: *LE not assessed at evaluation, however pt remarks on weakness/pain with seated Right hip flexion needed for car transfers.   -pt demonstrates standing: hip flexion: fatigue/pain on right 1x10 bilat c chair support shows impaired excursion of height on both sides and progressive loss of height over 8 reps on right side.  *warrants closer assessment of Rt hip to rule out joint disease, neurological weakness, etc. Also reports intermittent knee giving way.   -Rt knee with mild degenerative valgus, however pt denies typical pain issues.   UPPER EXTREMITY ROM:     Active ROM Right 05/16/24 Left 05/16/24  Shoulder flexion 150 65* active, 120 passive  Shoulder abduction 165 60* active, 100 passive  Shoulder internal rotation 60 53  Shoulder external rotation 75 50   MMT BLE 09/27/24:  -Ankle DF: 5/5 bilat -Ankle eversion: 4+/5 bilat c soreness on Rt (lateral ankle edema bilat)  -Ankle inversion: 5/5 bilat  -Ankle PF: 30% ROM  rise single LLE; 0% ROM rise on RLE; interpretation: severe strength impairment. -Hip ER: 5/5 bilat  -Hip IR: 5/5 bilat   BALANCE SCREENING 09/27/24: -60sec narrow stance firm surface: low sway  -60sec narrow stance foam: heavy sway, no LOB  -normal stance on incline eyes closed: ~10 sec with posterior sway and LOB  -SLS: >10SEC bilat  TRANSFERS: 07/23/24: 5xSTS: 29.81sec   GAIT:  FUNCTIONAL TESTS:  07/23/24 TUG: 14.51 5xSTS: 29.81 : 11.69sec : 1140ft  PATIENT SURVEYS:  05/16/24: Quick DASH: 88.6% 07/23/24: Abc scale 24.38%                                                                                                                             TREATMENT DATE 09/30/24:    ***  -Orthostatic vital signs -MMT bilat ankles  -60sec narrow stance firm surface: low sway  -60sec narrow stance foam: heavy sway, no LOB  -normal stance on incline eyes closed: ~10 sec with posterior sway and LOB  -SLS: >10SEC bilat  PATIENT EDUCATION: Education details: Relevance of sway variations in testing, specificity to incline imbalance to neuropathy diagnosis.  Person educated: Product/process Development Scientist method: Discussion  Education comprehension: Good   HOME EXERCISE PROGRAM: Access Code: [email or web address] URL: [email or web address] Date: 05/22/2024 Prepared by: Lonni Gainer   Exercises - Seated Shoulder Flexion Towel Slide at Table Top  - 2 x daily - 7 x weekly - 10 reps - 5 sec hold - Seated Shoulder Abduction Towel Slide at Table Top  - 2 x daily - 7 x weekly - 10 reps - 5 sec hold - Supine Isometric Shoulder Extension with Towel  - 2 x daily - 7 x weekly - 2 sets - 10 reps  Access Code: [email or web address] URL: [email or web address] Date: 09/26/2024 Prepared by: Peggye Linear  Exercises - Heel Raises with Counter Support  - 1 x daily - 7 x weekly - 3 sets - 15 reps - Standing March with Counter Support  - 1 x daily - 7 x weekly - 3 sets - 10 reps  GOALS: Goals  reviewed with patient? Yes  SHORT TERM GOALS: Target date: 11/18/24  Patient will decrease Quick DASH score to less than 25% demonstrating reduced self-reported upper extremity disability. Baseline: 88.6 %; 07/23/24: 59.1% Goal status: NEW   2.  Patient will demonstrate L shoulder AROM in flexion and ABD > 90 degrees for improved ability to perform overhead activities of ADL and home care. Baseline: flexion- 65 active, abduction- 60 active; 07/02/24: Flexion: 54 deg; Abduction: 48 deg Goal status: NEW  3.  Pt to report independence and compliance with basic HEP for Left shoulder strength, pain management, and balance performance.  Baseline: HEP in place, compliance to follow Goal status: NEW  4.  Pt to demonstrate 5xSTS <18sec and >1220ft to improve baseline cardiorespiratory function and LE power closer to population norm values in order to maximize reaction time and strength for improved LOB recovery and cognitive wellness.  Baseline: 5xSTS: 29.81 sec, : 1175ft Goal status: NEW  5.  Pt to improve balance confidence AEB >50% improvement on ABC Scale. Baseline: 24.38%  Goal status: NEW  LONG TERM GOALS: Target date: 12/20/23  Pt to demonstrate 5/5 strength in left shoulder extension, ABDCT, ER, IR, and 4/5 left shoulder flexion <3/10 pain.  Baseline:  Goal  status: INITIAL  2.  Patient will demonstrate L shoulder AROM in flexion and ABD > 125 degrees for improved ability to perform overhead activities of ADL and home care. Baseline: flexion- 65 active, abduction- 60 active; 07/02/24: Flexion: 54 deg; Abduction: 48 deg Goal status: NEW  3.  Pt to report independence and compliance with advanced, gym or home based exercise regimen for BUE strength, BLE strength, and cardio fitness for continued progress prior to and post discharge.  Baseline: HEP in place, compliance to follow Goal status: NEW Baseline:  Goal status: INITIAL  4.  Pt to demonstrate 5xSTS <12sec and >1533ft  to improve baseline cardiorespiratory function and LE power closer to population norm values in order to maximize reaction time and strength for improved LOB recovery and cognitive wellness.  Baseline: 5xSTS: 29.81 sec, : 1155ft Goal status: NEW  5.  Pt to demonstrate ability to perform eyes closed narrow stance balance on foam surface at modI level 3x60sec and eyes closed firm surface inclined stance 3x30sec at modI level.  Baseline: minguard to minA and <10sec respectively;  Goal status: INITIAL  ASSESSMENT:  CLINICAL IMPRESSION:  *** 68yoF who has been followed by our services for several weeks now for Left shoulder injury, now in 2nd recent exacerbation and injury and started on some balance interventions, formally evaluated in-depth for persistent imbalance and persistent falls anxiety. Exam pt has made good progress with shoulder pain functional activity tolerance, but has succumbed to repeated events of exacerbation, however pt remains a good candidate for improvement in ROM and motor function overall. Balance assessment gives more specific picture of particular motor deficits and sensory deficits in the ankles that are contributing to limitations of postural control. Pt shows good proprioceptive and righting function at the level of hips and trunk, however also shows signs of aberrant Rt hip and Rt knee function not fully examined on today's visit.  Pt's values for 5xSTS and remain far off population normative values which independent of neuropathy may be adding an easily modifiable secondary set of risk factors for imbalance.  Patient will benefit from skilled physical therapy intervention to reduce deficits and impairments identified in evaluation, in order to reduce pain, improve quality of life, and maximize activity tolerance for ADL, IADL, and leisure/fitness.  Physical therapy will help pt achieve long and short term goals of care.    OBJECTIVE IMPAIRMENTS: Decreased knowledge  of condition, decreased use of DME, decreased mobility, difficulty walking, decreased strength, decreased ROM. ACTIVITY LIMITATIONS: Lifting, standing, walking, squatting, transfers, locomotion level PARTICIPATION LIMITATIONS: Cleaning, laundry, interpersonal relationships, driving, yardwork, community activity.  PERSONAL FACTORS: Age, behavior pattern, education, past/current experiences, transportation, profession  are also affecting patient's functional outcome.  REHAB POTENTIAL: Good CLINICAL DECISION MAKING: Medium  EVALUATION COMPLEXITY: Moderate    PLAN:  PT FREQUENCY: 1-2x/week   PT DURATION: 12 weeks   PLANNED INTERVENTIONS: 97164- PT Re-evaluation, 97750- Physical Performance Testing, 97110-Therapeutic exercises, 97530- Therapeutic activity, 97112- Neuromuscular re-education, 97535- Self Care, [id number]- Manual therapy, [phone]- Gait training, [phone]- Electrical stimulation (unattended), [phone]- Electrical stimulation (manual), Patient/Family education, Balance training, Stair training, Joint mobilization, Joint manipulation, Cryotherapy, and Moist heat  PLAN FOR NEXT SESSION:   Reassess A/ROM Flexion/ABDCT Left shoulder  Updated Quick DASH Updated ABC Scale Updated  Updated 5xSTS  FU on New HEP items from last visit     Fonda Simpers, PT, DPT Physical Therapist - [place]  [facility]  09/30/24, 8:00 AM

## 2024-10-02 ENCOUNTER — Ambulatory Visit

## 2024-10-02 DIAGNOSIS — R262 Difficulty in walking, not elsewhere classified: Secondary | ICD-10-CM

## 2024-10-02 DIAGNOSIS — G8929 Other chronic pain: Secondary | ICD-10-CM

## 2024-10-02 DIAGNOSIS — R2689 Other abnormalities of gait and mobility: Secondary | ICD-10-CM

## 2024-10-02 DIAGNOSIS — M25612 Stiffness of left shoulder, not elsewhere classified: Secondary | ICD-10-CM

## 2024-10-02 DIAGNOSIS — M6281 Muscle weakness (generalized): Secondary | ICD-10-CM

## 2024-10-02 NOTE — Therapy (Signed)
 OUTPATIENT PHYSICAL THERAPY TREATMENT  Patient Name: Roberta Lane MRN: [id number] DOB:[birth year], 68 y.o., female Today's Date: 10/02/2024  PCP: Roberta Lynwood FALCON, MD  REFERRING PROVIDER: Valora Lynwood FALCON, MD   END OF SESSION:  PT End of Session - 10/02/24 1451     Visit Number 2    Number of Visits 24    Date for Recertification  12/19/24    Authorization Type Medicare/ BCBS secondary    Authorization Time Period 09/26/24-12/19/24    Progress Note Due on Visit 10    PT Start Time 1451    PT Stop Time 1530    PT Time Calculation (min) 39 min    Equipment Utilized During Treatment Gait belt    Activity Tolerance Patient tolerated treatment well;No increased pain    Behavior During Therapy WFL for tasks assessed/performed           Past Medical History:  Diagnosis Date   Abdominal hernia    Allergic genetic state    Aortic ejection murmur    Ascites    Chronic hepatitis C (HCC)    Depression    Diabetes mellitus without complication (HCC)    Dysrhythmia    GERD (gastroesophageal reflux disease)    GIB (gastrointestinal bleeding)    Headache    Hepatic cirrhosis (HCC)    Hepatitis C    Hernia, abdominal    Hyperlipidemia    Hypertension    IDA (iron  deficiency anemia)    Inflamed seborrheic keratosis    Interdigital neuroma of foot    Irregular heart rhythm    Liver disease    Multiple renal cysts    Palpitations    Pancytopenia (HCC)    Portal hypertension with esophageal varices (HCC)    Splenomegaly    UTI (urinary tract infection)    Past Surgical History:  Procedure Laterality Date   ABDOMINAL HYSTERECTOMY     CHOLECYSTECTOMY     COLONOSCOPY WITH PROPOFOL  N/A 12/05/2023   Procedure: COLONOSCOPY WITH PROPOFOL ;  Surgeon: Toledo, Ladell POUR, MD;  Location: [facility];  Service: Gastroenterology;  Laterality: N/A;  DM   ESOPHAGOGASTRODUODENOSCOPY N/A 02/17/2021   Procedure: ESOPHAGOGASTRODUODENOSCOPY (EGD);  Surgeon: Toledo, Ladell POUR, MD;   Location: [facility];  Service: Gastroenterology;  Laterality: N/A;   ESOPHAGOGASTRODUODENOSCOPY (EGD) WITH PROPOFOL  N/A 11/23/2017   Procedure: ESOPHAGOGASTRODUODENOSCOPY (EGD) WITH PROPOFOL ;  Surgeon: Toledo, Ladell POUR, MD;  Location: [facility];  Service: Gastroenterology;  Laterality: N/A;   ESOPHAGOGASTRODUODENOSCOPY (EGD) WITH PROPOFOL  N/A 10/23/2018   Procedure: ESOPHAGOGASTRODUODENOSCOPY (EGD) WITH PROPOFOL ;  Surgeon: Toledo, Ladell POUR, MD;  Location: [facility];  Service: Gastroenterology;  Laterality: N/A;   ESOPHAGOGASTRODUODENOSCOPY (EGD) WITH PROPOFOL  N/A 10/10/2019   Procedure: ESOPHAGOGASTRODUODENOSCOPY (EGD) WITH PROPOFOL ;  Surgeon: Toledo, Ladell POUR, MD;  Location: [facility];  Service: Gastroenterology;  Laterality: N/A;   ESOPHAGOGASTRODUODENOSCOPY (EGD) WITH PROPOFOL  N/A 11/14/2019   Procedure: ESOPHAGOGASTRODUODENOSCOPY (EGD) WITH PROPOFOL ;  Surgeon: Toledo, Ladell POUR, MD;  Location: [facility];  Service: Gastroenterology;  Laterality: N/A;   ESOPHAGOGASTRODUODENOSCOPY (EGD) WITH PROPOFOL  N/A 12/05/2023   Procedure: ESOPHAGOGASTRODUODENOSCOPY (EGD) WITH PROPOFOL ;  Surgeon: Toledo, Ladell POUR, MD;  Location: [facility];  Service: Gastroenterology;  Laterality: N/A;   right elbow surgery     WISDOM TOOTH EXTRACTION     Patient Active Problem List   Diagnosis Date Noted   Renal mass, left 04/11/2020   Hypertension 03/18/2020   Hepatic cirrhosis due to chronic hepatitis C infection (HCC) 11/28/2019   Chronic pain of both knees 03/26/2019  Other ascites 03/26/2019   Aortic stenosis, mild 01/07/2019   Iron  deficiency anemia 12/31/2018   Hepatic encephalopathy (HCC) 07/20/2018   Frequent PVCs 06/13/2018   Portal hypertension with esophageal varices (HCC) 06/12/2018   Aortic ejection murmur 05/29/2018   Dizziness 05/29/2018   Hyperlipidemia, mixed 05/29/2018   Palpitations 05/29/2018   Controlled type 2 diabetes mellitus without complication, without  long-term current use of insulin  (HCC) 05/17/2018   Abdominal pain, diffuse 12/14/2017   Gastroesophageal reflux disease without esophagitis 12/14/2017   Pancytopenia (HCC) 12/10/2017   GIB (gastrointestinal bleeding) 11/21/2017    ONSET DATE: Left shoulder injury September 2025; Imbalance Mid 2024 (gradual progressive onset)    REFERRING DIAG: Left shoulder supraspinatus tear; neuropathy imbalance   THERAPY DIAG:  Imbalance  Muscle weakness (generalized)  Difficulty in walking, not elsewhere classified  Chronic left shoulder pain  Decreased range of motion of shoulder, left  Other abnormalities of gait and mobility  Rationale for Evaluation and Treatment: Rehabilitation   SUBJECTIVE:                                                                                                                                                                                             SUBJECTIVE STATEMENT:  Pt reports she is doing well and the re-evaluation was beneficial at the last session.  Pt claimed that she is still having some increased difficulty with the L shoulder ROM and strength, along with pain utilizing the LE.    PERTINENT HISTORY:   Roberta Lane is a 68yoF who is referred to OPPT for 2 new problems: pt referred from orthopedics Roberta Lane regarding a fall in Sept 2024 c subsequent shoulder pain, MRI findings later revealing supraspinatus partial thickness tear. The second referral for imbalance, gait instability comes from PCP Dr. Valora after new onset falls x3-4 since Sept 2024- pt referred to Roberta Lane neurology late 2025 where she is found to have BLE neuropathy from knees down to toes. At baseline pt is fully independent, AMB freely in the community, used to walk her late dog regularly (traumatic passing in Oct 2025).   PROBLEM 1: Shoulder pain/supraspinatus tear Pt sustained a fall in Sept 2024 while stepping onto a bus landing on left shoulder, persistent pain in  Left shoulder/forearm, thumb, ultimately seen first for this by PCP, then by orthopedics Roberta Lane 04/17/24, xray showing slight A/C joint separation and exam suggestive of rotator cuff injury, subsequent MRI 04/21/24 revealing of supraspinatus tendinopathy/partial tear, moderate biceps tendinopathy. Ortho recommended 4 weeks of PT at that time. Pt started OPPT here on 05/16/24, initial Quickdash 88.6%. Second fall on 06/29/24 landing  on left shoulder again. PT recommended ortho FU and pt took 3 weeks off from PT, returning on 07/23/24 after orthopedic clearance and reassuring imaging/corticosteroid Rx. Pt felts that she was making progress with shoulder until she fell, and desired to regain these improvements. 2nd week November 2025, pt's dog was attacked/killed by a pit bull and pt hurt her Left shoulder again while trying to separate the dogs.    PROBLEM 2: History of falls/neuropathy  First fall in September 2024, then second fall on 07/02/24 landing on left shoulder again, FU with PCP on 9/18 asking for PT referral for balance issues >1 year. Pt told PCP and hepatology about occasional dizziness, particularly if standing to quickly, however no falls occurring during these episodes. PCP also recommending neurology consult. Pt returned to PT 07/23/24 with new consult from PCP, TUG: 14.51sec, 29.81sec, : 11.69sec, : 1167ft, ABC scale: 24.38%. She experiences occasional imbalance, tripping over her feet, and has a fear of escalators. She sometimes feels dizzy or lightheaded, particularly when standing up quickly. She has a history of diabetes, with a well-controlled A1c of 5.4 as of last A1C 05/2023.  PAIN:  Are you having pain? 2/10 at rest, left lateral shoulder (worse with A/ROM or functional use); In past week, highest pain: 8/10 (09/26/24)    PRECAUTIONS: None  WEIGHT BEARING RESTRICTIONS: No  FALLS HISTORY:  2 falls since Sept 2024; once while stepping onto an airport shuttle bus, fell backwards and  landed on Left shoudler; 2nd when stepping down a curb unaware of its presence and falling onto left shoulder.   LIVING ENVIRONMENT: Lives with: lives alone, neighbors able to stop by if she needs them Lives in: House/apartment Stairs: Yes: Internal: 14 steps; railing on left going up Has following equipment at home: Shower bench   OCCUPATION: Retired- spends her days watching tv   PLOF: Independent  PATIENT GOALS:   1. Shoulder: have less pain in general, be able to freely use BUE for tasks like opening jars   2. Balance: avoid any additional falls, be more confident with curbs, steps  OBJECTIVE:  Note: Objective measures were completed at Evaluation unless otherwise noted.  DIAGNOSTIC FINDINGS:  07/12/24:  Note from Roberta Lane, [facility]: MRI of the left shoulder was reviewed, showing a high-grade partial tear of the supraspinatus tendon, measuring 3 to 6 millimeters. Independent interpretation performed by me confirms the partial tear without full detachment.(...)  I recommended non-operative management at this time. A prednisone taper was prescribed to manage inflammation and pain, with instructions to monitor blood sugar levels due to her diabetes. If symptoms do not improve, a steroid injection may be considered. The patient was encouraged to return to therapy and was provided with a new referral.  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 Referral to PT for gait and balance.  COGNITION: Overall cognitive status: WNL  VITAL SIGNS:  09/26/24:  -132/26mmHg 69bpm seated  -132/61mmHg 74bpm standing (no dizziness symptoms)    EDEMA:  Ankle edema bilat, focal to the ATFL area, pt reports as chronic.   ROM ASSESSMENT: *LE not assessed at evaluation, however pt  remarks on weakness/pain with seated Right hip flexion needed for car transfers.   -pt demonstrates standing: hip flexion: fatigue/pain on right 1x10 bilat c chair support shows impaired excursion of height on  both sides and progressive loss of height over 8 reps on right side.  *warrants closer assessment of Rt hip to rule out joint disease, neurological weakness, etc. Also reports intermittent knee giving way.   -Rt knee with mild degenerative valgus, however pt denies typical pain issues.   UPPER EXTREMITY ROM:     Active ROM Right 05/16/24 Left 05/16/24 Right 10/02/24 Left 10/02/24  Shoulder flexion 150 65* active, 120 passive 126 deg 81 deg  Shoulder abduction 165 60* active, 100 passive 153 deg 79 deg  Shoulder internal rotation 60 53    Shoulder external rotation 75 50     MMT BLE 09/27/24:  -Ankle DF: 5/5 bilat -Ankle eversion: 4+/5 bilat c soreness on Rt (lateral ankle edema bilat)  -Ankle inversion: 5/5 bilat  -Ankle PF: 30% ROM rise single LLE; 0% ROM rise on RLE; interpretation: severe strength impairment. -Hip ER: 5/5 bilat  -Hip IR: 5/5 bilat   BALANCE SCREENING 09/27/24: -60sec narrow stance firm surface: low sway  -60sec narrow stance foam: heavy sway, no LOB  -normal stance on incline eyes closed: ~10 sec with posterior sway and LOB  -SLS: >10SEC bilat  TRANSFERS: 07/23/24: 5xSTS: 29.81sec   FUNCTIONAL TESTS:  07/23/24 TUG: 14.51 sec 5xSTS: 29.81 sec : 11.69sec : 1182ft  10/02/24:  5xSTS: 16.24 sec : 1189' : 1.04 m/s TUG: 9.99 sec   PATIENT SURVEYS:  05/16/24: Quick DASH: 88.6% 07/23/24: Abc scale 24.38% 10/02/24: ABC Scale: 61.25%                                                                                                                             TREATMENT DATE 10/02/24:     Physical Performance Testing:  Five times Sit to Stand Test (FTSS)  TIME: 16.24 sec  Cut off scores indicative of increased fall risk: >12 sec  CVA, >16 sec PD, >13 sec vestibular (ANPTA Core Set of Outcome Measures for Adults with Neurologic Conditions, 2018)  6 Min Walk Test:  Instructed patient to ambulate as quickly and as safely as possible for 6 minutes using LRAD. Patient was allowed to take standing rest breaks without stopping the test, but if the patient required a sitting rest break the clock would be stopped and the test would be over.  Results: 1189' feet using no AD with supervision. Results indicate that the patient has reduced endurance with ambulation compared to age matched norms.  Age Matched Norms (in meters): 74-69 yo M: 17 F: 22, 52-79 yo M: 68 F: 471, 71-89 yo M: 417 F: 392 MDC: 58.21 meters (190.98 feet) or 50 meters (ANPTA Core Set of Outcome Measures for Adults with Neurologic Conditions, 2018)   Activities-specific Balance Confidence Scale:  Score: 61.25% Increased risk of falls in community-dwelling, older adults <80% (79.89%)  0% = no confidence - 100% = complete confidence (ANPTA Core Set of Outcome Measures for Adults with Neurologic Conditions, 2018)   Quick DASH score:   PT instructed  pt in TUG: 9.99 sec ( >13.5 sec indicates increased fall risk)  10 Meter Walk Test: Patient instructed to walk 10 meters (32.8 ft) as quickly and as safely as possible at their normal speed Results: 1.04 m/s (9.58 seconds no AD)  Cut off scores:   Household Ambulator  < 0.4 m/s  Limited Community Ambulator  0.4 - 0.8 m/s  [employer]  > 0.8 m/s  Increased fall risk  < 1.3m/s  Crossing a Street  >1.29m/s  MCID 0.05 m/s (small), 0.13 m/s (moderate), 0.06 m/s (significant)  (ANPTA Core Set of Outcome Measures for Adults with Neurologic Conditions, 2018)       PATIENT EDUCATION: Education details: Relevance of sway variations in testing, specificity to incline imbalance to neuropathy diagnosis.  Person educated: Product/process Development Scientist method: Discussion  Education comprehension: Good   HOME  EXERCISE PROGRAM: Access Code: [email or web address] URL: [email or web address] Date: 05/22/2024 Prepared by: Lonni Gainer   Exercises - Seated Shoulder Flexion Towel Slide at Table Top  - 2 x daily - 7 x weekly - 10 reps - 5 sec hold - Seated Shoulder Abduction Towel Slide at Table Top  - 2 x daily - 7 x weekly - 10 reps - 5 sec hold - Supine Isometric Shoulder Extension with Towel  - 2 x daily - 7 x weekly - 2 sets - 10 reps  Access Code: [email or web address] URL: [email or web address] Date: 09/26/2024 Prepared by: Peggye Linear  Exercises - Heel Raises with Counter Support  - 1 x daily - 7 x weekly - 3 sets - 15 reps - Standing March with Counter Support  - 1 x daily - 7 x weekly - 3 sets - 10 reps  GOALS: Goals reviewed with patient? Yes  SHORT TERM GOALS: Target date: 11/18/24  Patient will decrease Quick DASH score to less than 25% demonstrating reduced self-reported upper extremity disability. Baseline: 88.6 %; 07/23/24: 59.1% Goal status: NEW   2.  Patient will demonstrate L shoulder AROM in flexion and ABD > 90 degrees for improved ability to perform overhead activities of ADL and home care. Baseline: flexion- 65 active, abduction- 60 active; 07/02/24: Flexion: 54 deg; Abduction: 48 deg Goal status: NEW  3.  Pt to report independence and compliance with basic HEP for Left shoulder strength, pain management, and balance performance.  Baseline: HEP in place, compliance to follow Goal status: NEW  4.  Pt to demonstrate 5xSTS <18sec and >1260ft to improve baseline cardiorespiratory function and LE power closer to population norm values in order to maximize reaction time and strength for improved LOB recovery and cognitive wellness.  Baseline: 5xSTS: 29.81 sec, : 1176ft Goal status: NEW  5.  Pt to improve balance confidence AEB >50% improvement on ABC Scale. Baseline: 24.38%  Goal status: NEW  LONG TERM GOALS: Target date: 12/20/23  Pt to demonstrate 5/5  strength in left shoulder extension, ABDCT, ER, IR, and 4/5 left shoulder flexion <3/10 pain.  Baseline:  Goal status: INITIAL  2.  Patient will demonstrate L shoulder AROM in flexion and ABD > 125 degrees for improved ability to perform overhead activities of ADL and home care. Baseline: flexion- 65 active, abduction- 60 active; 07/02/24: Flexion: 54 deg; Abduction: 48 deg Goal status: NEW  3.  Pt to report independence and compliance with advanced, gym or home based exercise regimen for BUE strength, BLE strength, and cardio fitness for continued progress prior to and post discharge.  Baseline: HEP in place, compliance to follow Goal status:  NEW Baseline:  Goal status: INITIAL  4.  Pt to demonstrate 5xSTS <12sec and >1562ft to improve baseline cardiorespiratory function and LE power closer to population norm values in order to maximize reaction time and strength for improved LOB recovery and cognitive wellness.  Baseline: 5xSTS: 29.81 sec, : 1123ft Goal status: NEW  5.  Pt to demonstrate ability to perform eyes closed narrow stance balance on foam surface at modI level 3x60sec and eyes closed firm surface inclined stance 3x30sec at modI level.  Baseline: minguard to minA and <10sec respectively;  Goal status: INITIAL  ASSESSMENT:  CLINICAL IMPRESSION:  Pt responded well to the testing and has made some improvements in the balance testing as expected.  Pt still has some goals that are not met at this time and the pt would benefit from continued balance training in order to improve confidence, balance, and to prevent future falls that is the ultimate cause for her shoulder pain after falling on it. Pt ultimately agreeable to these items being the most present at this time.  Pt will continue to benefit from skilled therapy to address remaining deficits in order to improve overall QoL and return to PLOF.    OBJECTIVE IMPAIRMENTS: Decreased knowledge of condition, decreased use of  DME, decreased mobility, difficulty walking, decreased strength, decreased ROM. ACTIVITY LIMITATIONS: Lifting, standing, walking, squatting, transfers, locomotion level PARTICIPATION LIMITATIONS: Cleaning, laundry, interpersonal relationships, driving, yardwork, community activity.  PERSONAL FACTORS: Age, behavior pattern, education, past/current experiences, transportation, profession  are also affecting patient's functional outcome.  REHAB POTENTIAL: Good CLINICAL DECISION MAKING: Medium  EVALUATION COMPLEXITY: Moderate    PLAN:  PT FREQUENCY: 1-2x/week   PT DURATION: 12 weeks   PLANNED INTERVENTIONS: 97164- PT Re-evaluation, 97750- Physical Performance Testing, 97110-Therapeutic exercises, 97530- Therapeutic activity, 97112- Neuromuscular re-education, 97535- Self Care, [id number]- Manual therapy, [phone]- Gait training, [phone]- Electrical stimulation (unattended), [phone]- Electrical stimulation (manual), Patient/Family education, Balance training, Stair training, Joint mobilization, Joint manipulation, Cryotherapy, and Moist heat  PLAN FOR NEXT SESSION:   Continue with balance training Continue with UE exercises to improve overall ROM and mobility, along with strengthening  FU on New HEP items from last visit     Fonda Simpers, PT, DPT Physical Therapist - [facility]  10/02/24, 3:48 PM

## 2024-10-07 ENCOUNTER — Ambulatory Visit

## 2024-10-07 DIAGNOSIS — R2689 Other abnormalities of gait and mobility: Secondary | ICD-10-CM

## 2024-10-07 DIAGNOSIS — R262 Difficulty in walking, not elsewhere classified: Secondary | ICD-10-CM

## 2024-10-07 DIAGNOSIS — G8929 Other chronic pain: Secondary | ICD-10-CM

## 2024-10-07 DIAGNOSIS — M6281 Muscle weakness (generalized): Secondary | ICD-10-CM

## 2024-10-07 NOTE — Therapy (Signed)
 OUTPATIENT PHYSICAL THERAPY TREATMENT  Patient Name: Roberta Lane MRN: [id number] DOB:[birth year], 68 y.o., female Today's Date: 10/07/2024  PCP: Valora Lynwood FALCON, MD  REFERRING PROVIDER: Valora Lynwood FALCON, MD   END OF SESSION:  PT End of Session - 10/07/24 1313     Visit Number 3    Number of Visits 24    Date for Recertification  12/19/24    Authorization Type Medicare/ BCBS secondary    Authorization Time Period 09/26/24-12/19/24    Progress Note Due on Visit 10    PT Start Time 1315    PT Stop Time 1359    PT Time Calculation (min) 44 min    Equipment Utilized During Treatment Gait belt    Activity Tolerance Patient tolerated treatment well;No increased pain    Behavior During Therapy WFL for tasks assessed/performed            Past Medical History:  Diagnosis Date   Abdominal hernia    Allergic genetic state    Aortic ejection murmur    Ascites    Chronic hepatitis C (HCC)    Depression    Diabetes mellitus without complication (HCC)    Dysrhythmia    GERD (gastroesophageal reflux disease)    GIB (gastrointestinal bleeding)    Headache    Hepatic cirrhosis (HCC)    Hepatitis C    Hernia, abdominal    Hyperlipidemia    Hypertension    IDA (iron  deficiency anemia)    Inflamed seborrheic keratosis    Interdigital neuroma of foot    Irregular heart rhythm    Liver disease    Multiple renal cysts    Palpitations    Pancytopenia (HCC)    Portal hypertension with esophageal varices (HCC)    Splenomegaly    UTI (urinary tract infection)    Past Surgical History:  Procedure Laterality Date   ABDOMINAL HYSTERECTOMY     CHOLECYSTECTOMY     COLONOSCOPY WITH PROPOFOL  N/A 12/05/2023   Procedure: COLONOSCOPY WITH PROPOFOL ;  Surgeon: Toledo, Ladell POUR, MD;  Location: [facility];  Service: Gastroenterology;  Laterality: N/A;  DM   ESOPHAGOGASTRODUODENOSCOPY N/A 02/17/2021   Procedure: ESOPHAGOGASTRODUODENOSCOPY (EGD);  Surgeon: Toledo, Ladell POUR, MD;   Location: [facility];  Service: Gastroenterology;  Laterality: N/A;   ESOPHAGOGASTRODUODENOSCOPY (EGD) WITH PROPOFOL  N/A 11/23/2017   Procedure: ESOPHAGOGASTRODUODENOSCOPY (EGD) WITH PROPOFOL ;  Surgeon: Toledo, Ladell POUR, MD;  Location: [facility];  Service: Gastroenterology;  Laterality: N/A;   ESOPHAGOGASTRODUODENOSCOPY (EGD) WITH PROPOFOL  N/A 10/23/2018   Procedure: ESOPHAGOGASTRODUODENOSCOPY (EGD) WITH PROPOFOL ;  Surgeon: Toledo, Ladell POUR, MD;  Location: [facility];  Service: Gastroenterology;  Laterality: N/A;   ESOPHAGOGASTRODUODENOSCOPY (EGD) WITH PROPOFOL  N/A 10/10/2019   Procedure: ESOPHAGOGASTRODUODENOSCOPY (EGD) WITH PROPOFOL ;  Surgeon: Toledo, Ladell POUR, MD;  Location: [facility];  Service: Gastroenterology;  Laterality: N/A;   ESOPHAGOGASTRODUODENOSCOPY (EGD) WITH PROPOFOL  N/A 11/14/2019   Procedure: ESOPHAGOGASTRODUODENOSCOPY (EGD) WITH PROPOFOL ;  Surgeon: Toledo, Ladell POUR, MD;  Location: [facility];  Service: Gastroenterology;  Laterality: N/A;   ESOPHAGOGASTRODUODENOSCOPY (EGD) WITH PROPOFOL  N/A 12/05/2023   Procedure: ESOPHAGOGASTRODUODENOSCOPY (EGD) WITH PROPOFOL ;  Surgeon: Toledo, Ladell POUR, MD;  Location: [facility];  Service: Gastroenterology;  Laterality: N/A;   right elbow surgery     WISDOM TOOTH EXTRACTION     Patient Active Problem List   Diagnosis Date Noted   Renal mass, left 04/11/2020   Hypertension 03/18/2020   Hepatic cirrhosis due to chronic hepatitis C infection (HCC) 11/28/2019   Chronic pain of both knees 03/26/2019  Other ascites 03/26/2019   Aortic stenosis, mild 01/07/2019   Iron  deficiency anemia 12/31/2018   Hepatic encephalopathy (HCC) 07/20/2018   Frequent PVCs 06/13/2018   Portal hypertension with esophageal varices (HCC) 06/12/2018   Aortic ejection murmur 05/29/2018   Dizziness 05/29/2018   Hyperlipidemia, mixed 05/29/2018   Palpitations 05/29/2018   Controlled type 2 diabetes mellitus without complication, without  long-term current use of insulin  (HCC) 05/17/2018   Abdominal pain, diffuse 12/14/2017   Gastroesophageal reflux disease without esophagitis 12/14/2017   Pancytopenia (HCC) 12/10/2017   GIB (gastrointestinal bleeding) 11/21/2017    ONSET DATE: Left shoulder injury September 2025; Imbalance Mid 2024 (gradual progressive onset)    REFERRING DIAG: Left shoulder supraspinatus tear; neuropathy imbalance   THERAPY DIAG:  Imbalance  Muscle weakness (generalized)  Difficulty in walking, not elsewhere classified  Chronic left shoulder pain  Rationale for Evaluation and Treatment: Rehabilitation   SUBJECTIVE:                                                                                                                                                                                             SUBJECTIVE STATEMENT:  Patient reports she wants to work on her balance today. Reports her shoulder is a little better. Reports a few stumbles since she was here last.   PERTINENT HISTORY:   Roberta Lane is a 68yoF who is referred to OPPT for 2 new problems: pt referred from orthopedics Dr. Marchia regarding a fall in Sept 2024 c subsequent shoulder pain, MRI findings later revealing supraspinatus partial thickness tear. The second referral for imbalance, gait instability comes from PCP Dr. Valora after new onset falls x3-4 since Sept 2024- pt referred to Dr. Lane neurology late 2025 where she is found to have BLE neuropathy from knees down to toes. At baseline pt is fully independent, AMB freely in the community, used to walk her late dog regularly (traumatic passing in Oct 2025).   PROBLEM 1: Shoulder pain/supraspinatus tear Pt sustained a fall in Sept 2024 while stepping onto a bus landing on left shoulder, persistent pain in Left shoulder/forearm, thumb, ultimately seen first for this by PCP, then by orthopedics Dr. Kathlynn 04/17/24, xray showing slight A/C joint separation and exam suggestive  of rotator cuff injury, subsequent MRI 04/21/24 revealing of supraspinatus tendinopathy/partial tear, moderate biceps tendinopathy. Ortho recommended 4 weeks of PT at that time. Pt started OPPT here on 05/16/24, initial Quickdash 88.6%. Second fall on 06/29/24 landing on left shoulder again. PT recommended ortho FU and pt took 3 weeks off from PT, returning on 07/23/24 after orthopedic clearance and reassuring imaging/corticosteroid Rx. Pt felts that  she was making progress with shoulder until she fell, and desired to regain these improvements. 2nd week November 2025, pt's dog was attacked/killed by a pit bull and pt hurt her Left shoulder again while trying to separate the dogs.    PROBLEM 2: History of falls/neuropathy  First fall in September 2024, then second fall on 07/02/24 landing on left shoulder again, FU with PCP on 9/18 asking for PT referral for balance issues >1 year. Pt told PCP and hepatology about occasional dizziness, particularly if standing to quickly, however no falls occurring during these episodes. PCP also recommending neurology consult. Pt returned to PT 07/23/24 with new consult from PCP, TUG: 14.51sec, 29.81sec, : 11.69sec, : 1130ft, ABC scale: 24.38%. She experiences occasional imbalance, tripping over her feet, and has a fear of escalators. She sometimes feels dizzy or lightheaded, particularly when standing up quickly. She has a history of diabetes, with a well-controlled A1c of 5.4 as of last A1C 05/2023.  PAIN:  Are you having pain? 2/10 at rest, left lateral shoulder (worse with A/ROM or functional use); In past week, highest pain: 8/10 (09/26/24)    PRECAUTIONS: None  WEIGHT BEARING RESTRICTIONS: No  FALLS HISTORY:  2 falls since Sept 2024; once while stepping onto an airport shuttle bus, fell backwards and landed on Left shoudler; 2nd when stepping down a curb unaware of its presence and falling onto left shoulder.   LIVING ENVIRONMENT: Lives with: lives alone,  neighbors able to stop by if she needs them Lives in: House/apartment Stairs: Yes: Internal: 14 steps; railing on left going up Has following equipment at home: Shower bench   OCCUPATION: Retired- spends her days watching tv   PLOF: Independent  PATIENT GOALS:   1. Shoulder: have less pain in general, be able to freely use BUE for tasks like opening jars   2. Balance: avoid any additional falls, be more confident with curbs, steps  OBJECTIVE:  Note: Objective measures were completed at Evaluation unless otherwise noted.  DIAGNOSTIC FINDINGS:  07/12/24:  Note from Dr. Marchia, [facility]: MRI of the left shoulder was reviewed, showing a high-grade partial tear of the supraspinatus tendon, measuring 3 to 6 millimeters. Independent interpretation performed by me confirms the partial tear without full detachment.(...)  I recommended non-operative management at this time. A prednisone taper was prescribed to manage inflammation and pain, with instructions to monitor blood sugar levels due to her diabetes. If symptoms do not improve, a steroid injection may be considered. The patient was encouraged to return to therapy and was provided with a new referral.  08/26/24 Note from Dr. 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 Referral to PT for gait and balance.  COGNITION: Overall cognitive status: WNL  VITAL SIGNS:  09/26/24:  -132/63mmHg 69bpm seated  -132/46mmHg 74bpm standing (no dizziness symptoms)    EDEMA:  Ankle edema bilat, focal to the ATFL area, pt reports as chronic.   ROM ASSESSMENT: *LE not assessed at evaluation, however pt remarks on weakness/pain with seated Right hip flexion needed for car transfers.   -pt demonstrates standing: hip flexion: fatigue/pain on right 1x10 bilat c  chair support shows impaired excursion of height on both sides and progressive loss of height over 8 reps on right side.  *warrants closer assessment of Rt hip to rule out joint disease, neurological weakness, etc. Also  reports intermittent knee giving way.   -Rt knee with mild degenerative valgus, however pt denies typical pain issues.   UPPER EXTREMITY ROM:     Active ROM Right 05/16/24 Left 05/16/24 Right 10/02/24 Left 10/02/24  Shoulder flexion 150 65* active, 120 passive 126 deg 81 deg  Shoulder abduction 165 60* active, 100 passive 153 deg 79 deg  Shoulder internal rotation 60 53    Shoulder external rotation 75 50     MMT BLE 09/27/24:  -Ankle DF: 5/5 bilat -Ankle eversion: 4+/5 bilat c soreness on Rt (lateral ankle edema bilat)  -Ankle inversion: 5/5 bilat  -Ankle PF: 30% ROM rise single LLE; 0% ROM rise on RLE; interpretation: severe strength impairment. -Hip ER: 5/5 bilat  -Hip IR: 5/5 bilat   BALANCE SCREENING 09/27/24: -60sec narrow stance firm surface: low sway  -60sec narrow stance foam: heavy sway, no LOB  -normal stance on incline eyes closed: ~10 sec with posterior sway and LOB  -SLS: >10SEC bilat  TRANSFERS: 07/23/24: 5xSTS: 29.81sec   FUNCTIONAL TESTS:  07/23/24 TUG: 14.51 sec 5xSTS: 29.81 sec : 11.69sec : 113ft  10/02/24:  5xSTS: 16.24 sec : 1189' : 1.04 m/s TUG: 9.99 sec   PATIENT SURVEYS:  05/16/24: Quick DASH: 88.6% 07/23/24: Abc scale 24.38% 10/02/24: ABC Scale: 61.25%                                                                                                                             TREATMENT DATE 10/07/2024:    Neuro Re-ed Standing with CGA next to support surface:  Airex pad: static stand 30 seconds x 2 trials, noticeable trembling of ankles/LE's with fatigue and challenge to maintain stability Airex pad: horizontal head turns 30 seconds scanning room 10x ; cueing for arc of motion  Airex pad: vertical head turns  30 seconds, cueing for arc of motion, noticeable sway with upward gaze increasing demand on ankle righting reaction musculature Airex pad: eyes closed 30 seconds   TherAct:  Airex pad orange hurdle step over 10x each LE Airex pad lateral step over hurdle 10x each LE  Sit to stand with airex pad under feet 10x  Backwards walking 4x length of // bars with cues for upright posture   PATIENT EDUCATION: Education details: Relevance of sway variations in testing, specificity to incline imbalance to neuropathy diagnosis.  Person educated: Product/process Development Scientist method: Discussion  Education comprehension: Good   HOME EXERCISE PROGRAM: Access Code: [email or web address] URL: [email or web address] Date: 05/22/2024 Prepared by: Lonni Gainer   Exercises - Seated Shoulder Flexion Towel Slide at Table Top  - 2 x daily - 7 x weekly - 10 reps - 5 sec hold - Seated Shoulder Abduction Towel Slide at Table Top  - 2 x daily - 7 x weekly - 10 reps - 5 sec hold - Supine Isometric Shoulder Extension with Towel  - 2 x daily - 7 x weekly - 2 sets - 10 reps  Access Code:  [email or web address] URL: [email or web address] Date: 09/26/2024 Prepared by: Peggye Linear  Exercises - Heel Raises with Counter Support  - 1 x daily - 7 x weekly - 3 sets - 15 reps - Standing March with Counter Support  - 1 x daily - 7 x weekly - 3 sets - 10 reps  GOALS: Goals reviewed with patient? Yes  SHORT TERM GOALS: Target date: 11/18/24  Patient will decrease Quick DASH score to less than 25% demonstrating reduced self-reported upper extremity disability. Baseline: 88.6 %; 07/23/24: 59.1% Goal status: NEW   2.  Patient will demonstrate L shoulder AROM in flexion and ABD > 90 degrees for improved ability to perform overhead activities of ADL and home care. Baseline: flexion- 65 active, abduction- 60 active; 07/02/24: Flexion: 54 deg; Abduction: 48 deg Goal status: NEW  3.  Pt to report independence and compliance  with basic HEP for Left shoulder strength, pain management, and balance performance.  Baseline: HEP in place, compliance to follow Goal status: NEW  4.  Pt to demonstrate 5xSTS <18sec and >1260ft to improve baseline cardiorespiratory function and LE power closer to population norm values in order to maximize reaction time and strength for improved LOB recovery and cognitive wellness.  Baseline: 5xSTS: 29.81 sec, : 1160ft Goal status: NEW  5.  Pt to improve balance confidence AEB >50% improvement on ABC Scale. Baseline: 24.38%  Goal status: NEW  LONG TERM GOALS: Target date: 12/20/23  Pt to demonstrate 5/5 strength in left shoulder extension, ABDCT, ER, IR, and 4/5 left shoulder flexion <3/10 pain.  Baseline:  Goal status: INITIAL  2.  Patient will demonstrate L shoulder AROM in flexion and ABD > 125 degrees for improved ability to perform overhead activities of ADL and home care. Baseline: flexion- 65 active, abduction- 60 active; 07/02/24: Flexion: 54 deg; Abduction: 48 deg Goal status: NEW  3.  Pt to report independence and compliance with advanced, gym or home based exercise regimen for BUE strength, BLE strength, and cardio fitness for continued progress prior to and post discharge.  Baseline: HEP in place, compliance to follow Goal status: NEW Baseline:  Goal status: INITIAL  4.  Pt to demonstrate 5xSTS <12sec and >159ft to improve baseline cardiorespiratory function and LE power closer to population norm values in order to maximize reaction time and strength for improved LOB recovery and cognitive wellness.  Baseline: 5xSTS: 29.81 sec, : 1147ft Goal status: NEW  5.  Pt to demonstrate ability to perform eyes closed narrow stance balance on foam surface at modI level 3x60sec and eyes closed firm surface inclined stance 3x30sec at modI level.  Baseline: minguard to minA and <10sec respectively;  Goal status: INITIAL  ASSESSMENT:  CLINICAL  IMPRESSION:  Patient is highly motivate throughout session. She is challenged with stepping with her RLE first over hurdle on unstable surface this session but improves with repetition and visual cues use of mirror.  Patient fatigues quickly in standing. Patient will miss next week due to the Holidays.  Pt will continue to benefit from skilled therapy to address remaining deficits in order to improve overall QoL and return to PLOF.    OBJECTIVE IMPAIRMENTS: Decreased knowledge of condition, decreased use of DME, decreased mobility, difficulty walking, decreased strength, decreased ROM. ACTIVITY LIMITATIONS: Lifting, standing, walking, squatting, transfers, locomotion level PARTICIPATION LIMITATIONS: Cleaning, laundry, interpersonal relationships, driving, yardwork, community activity.  PERSONAL FACTORS: Age, behavior pattern, education, past/current experiences, transportation, profession  are also affecting patient's functional outcome.  REHAB POTENTIAL: Good CLINICAL  DECISION MAKING: Medium  EVALUATION COMPLEXITY: Moderate    PLAN:  PT FREQUENCY: 1-2x/week   PT DURATION: 12 weeks   PLANNED INTERVENTIONS: 97164- PT Re-evaluation, 97750- Physical Performance Testing, 97110-Therapeutic exercises, 97530- Therapeutic activity, 97112- Neuromuscular re-education, 97535- Self Care, 02859- Manual therapy, [phone]- Gait training, [phone]- Electrical stimulation (unattended), [phone]- Electrical stimulation (manual), Patient/Family education, Balance training, Stair training, Joint mobilization, Joint manipulation, Cryotherapy, and Moist heat  PLAN FOR NEXT SESSION:   Continue with balance training Continue with UE exercises to improve overall ROM and mobility, along with strengthening  FU on New HEP items from last visit     Evangelyne Loja  Leopoldo, PT, DPT Physical Therapist - [facility]  Outpatient Physical Therapy- Main Campus [phone]    10/07/2024, 1:59 PM

## 2024-10-09 ENCOUNTER — Ambulatory Visit

## 2024-10-14 ENCOUNTER — Ambulatory Visit: Admitting: Physical Therapy

## 2024-10-15 ENCOUNTER — Ambulatory Visit

## 2024-10-16 ENCOUNTER — Ambulatory Visit: Admitting: Physical Therapy

## 2024-10-21 ENCOUNTER — Ambulatory Visit

## 2024-10-23 ENCOUNTER — Ambulatory Visit

## 2024-10-28 ENCOUNTER — Ambulatory Visit: Attending: Orthopedic Surgery

## 2024-10-28 DIAGNOSIS — R262 Difficulty in walking, not elsewhere classified: Secondary | ICD-10-CM | POA: Insufficient documentation

## 2024-10-28 DIAGNOSIS — R2689 Other abnormalities of gait and mobility: Secondary | ICD-10-CM | POA: Insufficient documentation

## 2024-10-28 DIAGNOSIS — R2681 Unsteadiness on feet: Secondary | ICD-10-CM | POA: Insufficient documentation

## 2024-10-28 DIAGNOSIS — M25612 Stiffness of left shoulder, not elsewhere classified: Secondary | ICD-10-CM | POA: Insufficient documentation

## 2024-10-28 DIAGNOSIS — M25512 Pain in left shoulder: Secondary | ICD-10-CM | POA: Insufficient documentation

## 2024-10-28 DIAGNOSIS — G8929 Other chronic pain: Secondary | ICD-10-CM | POA: Insufficient documentation

## 2024-10-28 DIAGNOSIS — M6281 Muscle weakness (generalized): Secondary | ICD-10-CM | POA: Insufficient documentation

## 2024-10-29 ENCOUNTER — Telehealth: Payer: Self-pay

## 2024-10-29 NOTE — Telephone Encounter (Signed)
 Pt did not arrive for scheduled appointment. No telephone call nor message preceeded this absence. Author attempted to contact pt via telephone number listed in chart.Pt is enroute back to [place] from [place]  after holiday travel. Pt was unable to make it back from [place]  in time for appointment due to inclement weather. Pt did not call in advance to inform clinic of this. Pt plans to be here for next visit.   4:54 PM, 10/29/2024 Peggye JAYSON Linear, PT, DPT Physical Therapist - [facility]  Outpatient Physical Therapy- Main Campus [phone]

## 2024-10-30 NOTE — Therapy (Signed)
 " OUTPATIENT PHYSICAL THERAPY TREATMENT  Patient Name: Roberta Lane MRN: [id number] DOB:01-Jun-1956, 69 y.o., female Today's Date: 10/31/2024  PCP: Valora Lynwood FALCON, MD  REFERRING PROVIDER: Valora Lynwood FALCON, MD   END OF SESSION:  PT End of Session - 10/31/24 1358     Visit Number 4    Number of Visits 24    Date for Recertification  12/19/24    Authorization Type Medicare/ BCBS secondary    Authorization Time Period 09/26/24-12/19/24    Progress Note Due on Visit 10    PT Start Time 1359    PT Stop Time 1444    PT Time Calculation (min) 45 min    Equipment Utilized During Treatment Gait belt    Activity Tolerance Patient tolerated treatment well;No increased pain    Behavior During Therapy WFL for tasks assessed/performed             Past Medical History:  Diagnosis Date   Abdominal hernia    Allergic genetic state    Aortic ejection murmur    Ascites    Chronic hepatitis C (HCC)    Depression    Diabetes mellitus without complication (HCC)    Dysrhythmia    GERD (gastroesophageal reflux disease)    GIB (gastrointestinal bleeding)    Headache    Hepatic cirrhosis (HCC)    Hepatitis C    Hernia, abdominal    Hyperlipidemia    Hypertension    IDA (iron  deficiency anemia)    Inflamed seborrheic keratosis    Interdigital neuroma of foot    Irregular heart rhythm    Liver disease    Multiple renal cysts    Palpitations    Pancytopenia (HCC)    Portal hypertension with esophageal varices (HCC)    Splenomegaly    UTI (urinary tract infection)    Past Surgical History:  Procedure Laterality Date   ABDOMINAL HYSTERECTOMY     CHOLECYSTECTOMY     COLONOSCOPY WITH PROPOFOL  N/A 12/05/2023   Procedure: COLONOSCOPY WITH PROPOFOL ;  Surgeon: Toledo, Ladell POUR, MD;  Location: [facility];  Service: Gastroenterology;  Laterality: N/A;  DM   ESOPHAGOGASTRODUODENOSCOPY N/A 02/17/2021   Procedure: ESOPHAGOGASTRODUODENOSCOPY (EGD);  Surgeon: Toledo, Ladell POUR, MD;   Location: [facility];  Service: Gastroenterology;  Laterality: N/A;   ESOPHAGOGASTRODUODENOSCOPY (EGD) WITH PROPOFOL  N/A 11/23/2017   Procedure: ESOPHAGOGASTRODUODENOSCOPY (EGD) WITH PROPOFOL ;  Surgeon: Toledo, Ladell POUR, MD;  Location: [facility];  Service: Gastroenterology;  Laterality: N/A;   ESOPHAGOGASTRODUODENOSCOPY (EGD) WITH PROPOFOL  N/A 10/23/2018   Procedure: ESOPHAGOGASTRODUODENOSCOPY (EGD) WITH PROPOFOL ;  Surgeon: Toledo, Ladell POUR, MD;  Location: [facility];  Service: Gastroenterology;  Laterality: N/A;   ESOPHAGOGASTRODUODENOSCOPY (EGD) WITH PROPOFOL  N/A 10/10/2019   Procedure: ESOPHAGOGASTRODUODENOSCOPY (EGD) WITH PROPOFOL ;  Surgeon: Toledo, Ladell POUR, MD;  Location: [facility];  Service: Gastroenterology;  Laterality: N/A;   ESOPHAGOGASTRODUODENOSCOPY (EGD) WITH PROPOFOL  N/A 11/14/2019   Procedure: ESOPHAGOGASTRODUODENOSCOPY (EGD) WITH PROPOFOL ;  Surgeon: Toledo, Ladell POUR, MD;  Location: [facility];  Service: Gastroenterology;  Laterality: N/A;   ESOPHAGOGASTRODUODENOSCOPY (EGD) WITH PROPOFOL  N/A 12/05/2023   Procedure: ESOPHAGOGASTRODUODENOSCOPY (EGD) WITH PROPOFOL ;  Surgeon: Toledo, Ladell POUR, MD;  Location: [facility];  Service: Gastroenterology;  Laterality: N/A;   right elbow surgery     WISDOM TOOTH EXTRACTION     Patient Active Problem List   Diagnosis Date Noted   Renal mass, left 04/11/2020   Hypertension 03/18/2020   Hepatic cirrhosis due to chronic hepatitis C infection (HCC) 11/28/2019   Chronic pain of both knees  03/26/2019   Other ascites 03/26/2019   Aortic stenosis, mild 01/07/2019   Iron  deficiency anemia 12/31/2018   Hepatic encephalopathy (HCC) 07/20/2018   Frequent PVCs 06/13/2018   Portal hypertension with esophageal varices (HCC) 06/12/2018   Aortic ejection murmur 05/29/2018   Dizziness 05/29/2018   Hyperlipidemia, mixed 05/29/2018   Palpitations 05/29/2018   Controlled type 2 diabetes mellitus without complication, without  long-term current use of insulin  (HCC) 05/17/2018   Abdominal pain, diffuse 12/14/2017   Gastroesophageal reflux disease without esophagitis 12/14/2017   Pancytopenia (HCC) 12/10/2017   GIB (gastrointestinal bleeding) 11/21/2017    ONSET DATE: Left shoulder injury September 2025; Imbalance Mid 2024 (gradual progressive onset)    REFERRING DIAG: Left shoulder supraspinatus tear; neuropathy imbalance   THERAPY DIAG:  Muscle weakness (generalized)  Difficulty in walking, not elsewhere classified  Imbalance  Rationale for Evaluation and Treatment: Rehabilitation   SUBJECTIVE:                                                                                                                                                                                             SUBJECTIVE STATEMENT:  Patient returning from [place] , had to go through blizzards   PERTINENT HISTORY:   Roberta Lane is a 69yoF who is referred to OPPT for 2 new problems: pt referred from orthopedics Dr. Marchia regarding a fall in Sept 2024 c subsequent shoulder pain, MRI findings later revealing supraspinatus partial thickness tear. The second referral for imbalance, gait instability comes from PCP Dr. Valora after new onset falls x3-4 since Sept 2024- pt referred to Dr. Lane neurology late 2025 where she is found to have BLE neuropathy from knees down to toes. At baseline pt is fully independent, AMB freely in the community, used to walk her late dog regularly (traumatic passing in Oct 2025).   PROBLEM 1: Shoulder pain/supraspinatus tear Pt sustained a fall in Sept 2024 while stepping onto a bus landing on left shoulder, persistent pain in Left shoulder/forearm, thumb, ultimately seen first for this by PCP, then by orthopedics Dr. Kathlynn 04/17/24, xray showing slight A/C joint separation and exam suggestive of rotator cuff injury, subsequent MRI 04/21/24 revealing of supraspinatus tendinopathy/partial tear, moderate biceps  tendinopathy. Ortho recommended 4 weeks of PT at that time. Pt started OPPT here on 05/16/24, initial Quickdash 88.6%. Second fall on 06/29/24 landing on left shoulder again. PT recommended ortho FU and pt took 3 weeks off from PT, returning on 07/23/24 after orthopedic clearance and reassuring imaging/corticosteroid Rx. Pt felts that she was making progress with shoulder until she fell, and desired to regain these improvements. 2nd week November 2025,  pt's dog was attacked/killed by a pit bull and pt hurt her Left shoulder again while trying to separate the dogs.    PROBLEM 2: History of falls/neuropathy  First fall in September 2024, then second fall on 07/02/24 landing on left shoulder again, FU with PCP on 9/18 asking for PT referral for balance issues >1 year. Pt told PCP and hepatology about occasional dizziness, particularly if standing to quickly, however no falls occurring during these episodes. PCP also recommending neurology consult. Pt returned to PT 07/23/24 with new consult from PCP, TUG: 14.51sec, 29.81sec, : 11.69sec, : 1171ft, ABC scale: 24.38%. She experiences occasional imbalance, tripping over her feet, and has a fear of escalators. She sometimes feels dizzy or lightheaded, particularly when standing up quickly. She has a history of diabetes, with a well-controlled A1c of 5.4 as of last A1C 05/2023.  PAIN:  Are you having pain? 2/10 at rest, left lateral shoulder (worse with A/ROM or functional use); In past week, highest pain: 8/10 (09/26/24)    PRECAUTIONS: None  WEIGHT BEARING RESTRICTIONS: No  FALLS HISTORY:  2 falls since Sept 2024; once while stepping onto an airport shuttle bus, fell backwards and landed on Left shoudler; 2nd when stepping down a curb unaware of its presence and falling onto left shoulder.   LIVING ENVIRONMENT: Lives with: lives alone, neighbors able to stop by if she needs them Lives in: House/apartment Stairs: Yes: Internal: 14 steps; railing on left  going up Has following equipment at home: Shower bench   OCCUPATION: Retired- spends her days watching tv   PLOF: Independent  PATIENT GOALS:   1. Shoulder: have less pain in general, be able to freely use BUE for tasks like opening jars   2. Balance: avoid any additional falls, be more confident with curbs, steps  OBJECTIVE:  Note: Objective measures were completed at Evaluation unless otherwise noted.  DIAGNOSTIC FINDINGS:  07/12/24:  Note from Dr. Marchia, [facility]: MRI of the left shoulder was reviewed, showing a high-grade partial tear of the supraspinatus tendon, measuring 3 to 6 millimeters. Independent interpretation performed by me confirms the partial tear without full detachment.(...)  I recommended non-operative management at this time. A prednisone taper was prescribed to manage inflammation and pain, with instructions to monitor blood sugar levels due to her diabetes. If symptoms do not improve, a steroid injection may be considered. The patient was encouraged to return to therapy and was provided with a new referral.  08/26/24 Note from Dr. 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 Referral to PT for gait and balance.  COGNITION: Overall cognitive status: WNL  VITAL SIGNS:  09/26/24:  -132/69mmHg 69bpm seated  -132/73mmHg 74bpm standing (no dizziness symptoms)    EDEMA:  Ankle edema bilat, focal to the ATFL area, pt reports as chronic.   ROM ASSESSMENT: *LE not assessed at evaluation, however pt remarks on weakness/pain with seated Right hip flexion needed for car transfers.   -pt demonstrates standing: hip flexion: fatigue/pain on right 1x10 bilat c chair support shows impaired excursion of height on both sides and progressive loss of height over 8 reps on right side.   *warrants closer assessment of Rt hip to rule out joint disease, neurological weakness, etc. Also reports intermittent knee giving way.   -Rt knee with mild degenerative valgus, however pt denies typical pain issues.  UPPER EXTREMITY ROM:     Active ROM Right 05/16/24 Left 05/16/24 Right 10/02/24 Left 10/02/24  Shoulder flexion 150 65* active, 120 passive 126 deg 81 deg  Shoulder abduction 165 60* active, 100 passive 153 deg 79 deg  Shoulder internal rotation 60 53    Shoulder external rotation 75 50     MMT BLE 09/27/24:  -Ankle DF: 5/5 bilat -Ankle eversion: 4+/5 bilat c soreness on Rt (lateral ankle edema bilat)  -Ankle inversion: 5/5 bilat  -Ankle PF: 30% ROM rise single LLE; 0% ROM rise on RLE; interpretation: severe strength impairment. -Hip ER: 5/5 bilat  -Hip IR: 5/5 bilat   BALANCE SCREENING 09/27/24: -60sec narrow stance firm surface: low sway  -60sec narrow stance foam: heavy sway, no LOB  -normal stance on incline eyes closed: ~10 sec with posterior sway and LOB  -SLS: >10SEC bilat  TRANSFERS: 07/23/24: 5xSTS: 29.81sec   FUNCTIONAL TESTS:  07/23/24 TUG: 14.51 sec 5xSTS: 29.81 sec : 11.69sec : 1135ft  10/02/24:  5xSTS: 16.24 sec : 1189' : 1.04 m/s TUG: 9.99 sec   PATIENT SURVEYS:  05/16/24: Quick DASH: 88.6% 07/23/24: Abc scale 24.38% 10/02/24: ABC Scale: 61.25%                                                                                                                             TREATMENT DATE 10/31/2024:      TherAct:  Airex pad orange hurdle step over 15x each LE Airex pad lateral step over hurdle 15x each LE  Sit to stand with airex pad under feet 10x  Backwards walking 8x length of // bars with cues for upright posture Speed ladder:  -one step per square x12 lengths no UE support -lateral step per square x 12 lengths no UE support    PATIENT EDUCATION: Education details: Relevance of sway variations in testing,  specificity to incline imbalance to neuropathy diagnosis.  Person educated: Product/process Development Scientist method: Discussion  Education comprehension: Good   HOME EXERCISE PROGRAM: Access Code: [email or web address] URL: [email or web address] Date: 05/22/2024 Prepared by: Lonni Gainer   Exercises - Seated Shoulder Flexion Towel Slide at Table Top  - 2 x daily - 7 x weekly - 10 reps - 5 sec hold - Seated Shoulder Abduction Towel Slide at Table Top  - 2 x daily - 7 x weekly - 10 reps - 5 sec hold - Supine Isometric Shoulder Extension with Towel  - 2 x daily - 7 x weekly - 2 sets - 10 reps  Access Code: [email or web address] URL: [email or web address] Date: 09/26/2024 Prepared by: Peggye Linear  Exercises - Heel Raises with Counter Support  - 1 x daily - 7 x weekly - 3 sets - 15 reps - Standing March with Counter Support  - 1 x daily - 7 x weekly - 3 sets - 10 reps  GOALS: Goals reviewed with patient? Yes  SHORT TERM GOALS: Target date: 11/18/24  Patient will decrease Quick DASH score to  less than 25% demonstrating reduced self-reported upper extremity disability. Baseline: 88.6 %; 07/23/24: 59.1% Goal status: NEW   2.  Patient will demonstrate L shoulder AROM in flexion and ABD > 90 degrees for improved ability to perform overhead activities of ADL and home care. Baseline: flexion- 65 active, abduction- 60 active; 07/02/24: Flexion: 54 deg; Abduction: 48 deg Goal status: NEW  3.  Pt to report independence and compliance with basic HEP for Left shoulder strength, pain management, and balance performance.  Baseline: HEP in place, compliance to follow Goal status: NEW  4.  Pt to demonstrate 5xSTS <18sec and >1254ft to improve baseline cardiorespiratory function and LE power closer to population norm values in order to maximize reaction time and strength for improved LOB recovery and cognitive wellness.  Baseline: 5xSTS: 29.81 sec, : 1168ft Goal status: NEW  5.  Pt to improve  balance confidence AEB >50% improvement on ABC Scale. Baseline: 24.38%  Goal status: NEW  LONG TERM GOALS: Target date: 12/20/23  Pt to demonstrate 5/5 strength in left shoulder extension, ABDCT, ER, IR, and 4/5 left shoulder flexion <3/10 pain.  Baseline:  Goal status: INITIAL  2.  Patient will demonstrate L shoulder AROM in flexion and ABD > 125 degrees for improved ability to perform overhead activities of ADL and home care. Baseline: flexion- 65 active, abduction- 60 active; 07/02/24: Flexion: 54 deg; Abduction: 48 deg Goal status: NEW  3.  Pt to report independence and compliance with advanced, gym or home based exercise regimen for BUE strength, BLE strength, and cardio fitness for continued progress prior to and post discharge.  Baseline: HEP in place, compliance to follow Goal status: NEW Baseline:  Goal status: INITIAL  4.  Pt to demonstrate 5xSTS <12sec and >154ft to improve baseline cardiorespiratory function and LE power closer to population norm values in order to maximize reaction time and strength for improved LOB recovery and cognitive wellness.  Baseline: 5xSTS: 29.81 sec, : 1172ft Goal status: NEW  5.  Pt to demonstrate ability to perform eyes closed narrow stance balance on foam surface at modI level 3x60sec and eyes closed firm surface inclined stance 3x30sec at modI level.  Baseline: minguard to minA and <10sec respectively;  Goal status: INITIAL  ASSESSMENT:  CLINICAL IMPRESSION:  Patient presents with excellent motivation after trip away. Large steps are challenging for patient due to stability of single limb stance. Unstable surface and foot placement is challenging and an area of continued focus.  Pt will continue to benefit from skilled therapy to address remaining deficits in order to improve overall QoL and return to PLOF.    OBJECTIVE IMPAIRMENTS: Decreased knowledge of condition, decreased use of DME, decreased mobility, difficulty walking,  decreased strength, decreased ROM. ACTIVITY LIMITATIONS: Lifting, standing, walking, squatting, transfers, locomotion level PARTICIPATION LIMITATIONS: Cleaning, laundry, interpersonal relationships, driving, yardwork, community activity.  PERSONAL FACTORS: Age, behavior pattern, education, past/current experiences, transportation, profession  are also affecting patient's functional outcome.  REHAB POTENTIAL: Good CLINICAL DECISION MAKING: Medium  EVALUATION COMPLEXITY: Moderate    PLAN:  PT FREQUENCY: 1-2x/week   PT DURATION: 12 weeks   PLANNED INTERVENTIONS: [id number]- PT Re-evaluation, 97750- Physical Performance Testing, 97110-Therapeutic exercises, 97530- Therapeutic activity, [id number]- Neuromuscular re-education, 97535- Self Care, [id number]- Manual therapy, [id number]- Gait training, [phone]- Electrical stimulation (unattended), [phone]- Electrical stimulation (manual), Patient/Family education, Balance training, Stair training, Joint mobilization, Joint manipulation, Cryotherapy, and Moist heat  PLAN FOR NEXT SESSION:   Continue with balance training Continue with UE exercises to improve  overall ROM and mobility, along with strengthening  FU on New HEP items from last visit     Margee Trentham  Leopoldo, PT, DPT Physical Therapist - [facility]  Outpatient Physical Therapy- Main Campus [phone]    10/31/2024, 2:46 PM   "

## 2024-10-31 ENCOUNTER — Ambulatory Visit

## 2024-10-31 DIAGNOSIS — M25612 Stiffness of left shoulder, not elsewhere classified: Secondary | ICD-10-CM | POA: Diagnosis present

## 2024-10-31 DIAGNOSIS — M6281 Muscle weakness (generalized): Secondary | ICD-10-CM | POA: Diagnosis present

## 2024-10-31 DIAGNOSIS — R2689 Other abnormalities of gait and mobility: Secondary | ICD-10-CM | POA: Diagnosis present

## 2024-10-31 DIAGNOSIS — R2681 Unsteadiness on feet: Secondary | ICD-10-CM | POA: Diagnosis present

## 2024-10-31 DIAGNOSIS — R262 Difficulty in walking, not elsewhere classified: Secondary | ICD-10-CM | POA: Diagnosis present

## 2024-10-31 DIAGNOSIS — M25512 Pain in left shoulder: Secondary | ICD-10-CM | POA: Diagnosis present

## 2024-10-31 DIAGNOSIS — G8929 Other chronic pain: Secondary | ICD-10-CM | POA: Diagnosis present

## 2024-11-04 ENCOUNTER — Ambulatory Visit

## 2024-11-04 DIAGNOSIS — R2689 Other abnormalities of gait and mobility: Secondary | ICD-10-CM

## 2024-11-04 DIAGNOSIS — M6281 Muscle weakness (generalized): Secondary | ICD-10-CM

## 2024-11-04 DIAGNOSIS — G8929 Other chronic pain: Secondary | ICD-10-CM

## 2024-11-04 DIAGNOSIS — R2681 Unsteadiness on feet: Secondary | ICD-10-CM | POA: Diagnosis not present

## 2024-11-04 DIAGNOSIS — R262 Difficulty in walking, not elsewhere classified: Secondary | ICD-10-CM

## 2024-11-04 NOTE — Therapy (Unsigned)
 " OUTPATIENT PHYSICAL THERAPY TREATMENT  Patient Name: Roberta Lane MRN: [id number] DOB:30-Aug-1956, 69 y.o., female Today's Date: 11/04/2024  PCP: Roberta Lynwood FALCON, MD  REFERRING PROVIDER: Valora Lynwood FALCON, MD   END OF SESSION:  PT End of Session - 11/04/24 1318     Visit Number 5    Number of Visits 24    Date for Recertification  12/19/24    Authorization Type Medicare/ BCBS secondary    Authorization Time Period 09/26/24-12/19/24    Progress Note Due on Visit 10    PT Start Time 1316    PT Stop Time 1356    PT Time Calculation (min) 40 min    Equipment Utilized During Treatment Gait belt    Activity Tolerance Patient tolerated treatment well;No increased pain    Behavior During Therapy WFL for tasks assessed/performed             Past Medical History:  Diagnosis Date   Abdominal hernia    Allergic genetic state    Aortic ejection murmur    Ascites    Chronic hepatitis C (HCC)    Depression    Diabetes mellitus without complication (HCC)    Dysrhythmia    GERD (gastroesophageal reflux disease)    GIB (gastrointestinal bleeding)    Headache    Hepatic cirrhosis (HCC)    Hepatitis C    Hernia, abdominal    Hyperlipidemia    Hypertension    IDA (iron  deficiency anemia)    Inflamed seborrheic keratosis    Interdigital neuroma of foot    Irregular heart rhythm    Liver disease    Multiple renal cysts    Palpitations    Pancytopenia (HCC)    Portal hypertension with esophageal varices (HCC)    Splenomegaly    UTI (urinary tract infection)    Past Surgical History:  Procedure Laterality Date   ABDOMINAL HYSTERECTOMY     CHOLECYSTECTOMY     COLONOSCOPY WITH PROPOFOL  N/A 12/05/2023   Procedure: COLONOSCOPY WITH PROPOFOL ;  Surgeon: Roberta Lane, Ladell POUR, MD;  Location: [facility];  Service: Gastroenterology;  Laterality: N/A;  DM   ESOPHAGOGASTRODUODENOSCOPY N/A 02/17/2021   Procedure: ESOPHAGOGASTRODUODENOSCOPY (EGD);  Surgeon: Roberta Lane, Ladell POUR, MD;   Location: [facility];  Service: Gastroenterology;  Laterality: N/A;   ESOPHAGOGASTRODUODENOSCOPY (EGD) WITH PROPOFOL  N/A 11/23/2017   Procedure: ESOPHAGOGASTRODUODENOSCOPY (EGD) WITH PROPOFOL ;  Surgeon: Roberta Lane, Ladell POUR, MD;  Location: [facility];  Service: Gastroenterology;  Laterality: N/A;   ESOPHAGOGASTRODUODENOSCOPY (EGD) WITH PROPOFOL  N/A 10/23/2018   Procedure: ESOPHAGOGASTRODUODENOSCOPY (EGD) WITH PROPOFOL ;  Surgeon: Roberta Lane, Ladell POUR, MD;  Location: [facility];  Service: Gastroenterology;  Laterality: N/A;   ESOPHAGOGASTRODUODENOSCOPY (EGD) WITH PROPOFOL  N/A 10/10/2019   Procedure: ESOPHAGOGASTRODUODENOSCOPY (EGD) WITH PROPOFOL ;  Surgeon: Roberta Lane, Ladell POUR, MD;  Location: [facility];  Service: Gastroenterology;  Laterality: N/A;   ESOPHAGOGASTRODUODENOSCOPY (EGD) WITH PROPOFOL  N/A 11/14/2019   Procedure: ESOPHAGOGASTRODUODENOSCOPY (EGD) WITH PROPOFOL ;  Surgeon: Roberta Lane, Ladell POUR, MD;  Location: [facility];  Service: Gastroenterology;  Laterality: N/A;   ESOPHAGOGASTRODUODENOSCOPY (EGD) WITH PROPOFOL  N/A 12/05/2023   Procedure: ESOPHAGOGASTRODUODENOSCOPY (EGD) WITH PROPOFOL ;  Surgeon: Roberta Lane, Ladell POUR, MD;  Location: [facility];  Service: Gastroenterology;  Laterality: N/A;   right elbow surgery     WISDOM TOOTH EXTRACTION     Patient Active Problem List   Diagnosis Date Noted   Renal mass, left 04/11/2020   Hypertension 03/18/2020   Hepatic cirrhosis due to chronic hepatitis C infection (HCC) 11/28/2019   Chronic pain of both knees  03/26/2019   Other ascites 03/26/2019   Aortic stenosis, mild 01/07/2019   Iron  deficiency anemia 12/31/2018   Hepatic encephalopathy (HCC) 07/20/2018   Frequent PVCs 06/13/2018   Portal hypertension with esophageal varices (HCC) 06/12/2018   Aortic ejection murmur 05/29/2018   Dizziness 05/29/2018   Hyperlipidemia, mixed 05/29/2018   Palpitations 05/29/2018   Controlled type 2 diabetes mellitus without complication, without  long-term current use of insulin  (HCC) 05/17/2018   Abdominal pain, diffuse 12/14/2017   Gastroesophageal reflux disease without esophagitis 12/14/2017   Pancytopenia (HCC) 12/10/2017   GIB (gastrointestinal bleeding) 11/21/2017    ONSET DATE: Left shoulder injury September 2025; Imbalance Mid 2024 (gradual progressive onset)    REFERRING DIAG: Left shoulder supraspinatus tear; neuropathy imbalance   THERAPY DIAG:  Muscle weakness (generalized)  Difficulty in walking, not elsewhere classified  Imbalance  Chronic left shoulder pain  Rationale for Evaluation and Treatment: Rehabilitation   SUBJECTIVE:                                                                                                                                                                                             SUBJECTIVE STATEMENT: Pt is a little stiff today. She has been using the hot pad on her left shoulder.   PERTINENT HISTORY:   Roberta Lane is a 68yoF who is referred to OPPT for 2 new problems: pt referred from orthopedics Roberta Lane regarding a fall in Sept 2024 c subsequent shoulder pain, MRI findings later revealing supraspinatus partial thickness tear. The second referral for imbalance, gait instability comes from PCP Dr. Valora after new onset falls x3-4 since Sept 2024- pt referred to Roberta Lane neurology late 2025 where she is found to have BLE neuropathy from knees down to toes. At baseline pt is fully independent, AMB freely in the community, used to walk her late dog regularly (traumatic passing in Oct 2025).   PROBLEM 1: Shoulder pain/supraspinatus tear Pt sustained a fall in Sept 2024 while stepping onto a bus landing on left shoulder, persistent pain in Left shoulder/forearm, thumb, ultimately seen first for this by PCP, then by orthopedics Dr. Kathlynn 04/17/24, xray showing slight A/C joint separation and exam suggestive of rotator cuff injury, subsequent MRI 04/21/24 revealing of  supraspinatus tendinopathy/partial tear, moderate biceps tendinopathy. Ortho recommended 4 weeks of PT at that time. Pt started OPPT here on 05/16/24, initial Quickdash 88.6%. Second fall on 06/29/24 landing on left shoulder again. PT recommended ortho FU and pt took 3 weeks off from PT, returning on 07/23/24 after orthopedic clearance and reassuring imaging/corticosteroid Rx. Pt felts that she was making progress with shoulder until  she fell, and desired to regain these improvements. 2nd week November 2025, pt's dog was attacked/killed by a pit bull and pt hurt her Left shoulder again while trying to separate the dogs.    PROBLEM 2: History of falls/neuropathy  First fall in September 2024, then second fall on 07/02/24 landing on left shoulder again, FU with PCP on 9/18 asking for PT referral for balance issues >1 year. Pt told PCP and hepatology about occasional dizziness, particularly if standing to quickly, however no falls occurring during these episodes. PCP also recommending neurology consult. Pt returned to PT 07/23/24 with new consult from PCP, TUG: 14.51sec, 29.81sec, : 11.69sec, : 1137ft, ABC scale: 24.38%. She experiences occasional imbalance, tripping over her feet, and has a fear of escalators. She sometimes feels dizzy or lightheaded, particularly when standing up quickly. She has a history of diabetes, with a well-controlled A1c of 5.4 as of last A1C 05/2023.  PAIN:  Are you having pain? 2/10 at rest, left lateral shoulder (worse with A/ROM or functional use); In past week, highest pain: 8/10 (09/26/24)    PRECAUTIONS: None  WEIGHT BEARING RESTRICTIONS: No  FALLS HISTORY:  2 falls since Sept 2024; once while stepping onto an airport shuttle bus, fell backwards and landed on Left shoudler; 2nd when stepping down a curb unaware of its presence and falling onto left shoulder.   LIVING ENVIRONMENT: Lives with: lives alone, neighbors able to stop by if she needs them Lives in:  House/apartment Stairs: Yes: Internal: 14 steps; railing on left going up Has following equipment at home: Shower bench   OCCUPATION: Retired- spends her days watching tv   PLOF: Independent  PATIENT GOALS:   1. Shoulder: have less pain in general, be able to freely use BUE for tasks like opening jars   2. Balance: avoid any additional falls, be more confident with curbs, steps  OBJECTIVE:  Note: Objective measures were completed at Evaluation unless otherwise noted.  DIAGNOSTIC FINDINGS:  07/12/24:  Note from Roberta Lane, [facility]: MRI of the left shoulder was reviewed, showing a high-grade partial tear of the supraspinatus tendon, measuring 3 to 6 millimeters. Independent interpretation performed by me confirms the partial tear without full detachment.(...)  I recommended non-operative management at this time. A prednisone taper was prescribed to manage inflammation and pain, with instructions to monitor blood sugar levels due to her diabetes. If symptoms do not improve, a steroid injection may be considered. The patient was encouraged to return to therapy and was provided with a new referral.  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 Referral to PT for gait and balance.  COGNITION: Overall cognitive status: WNL  VITAL SIGNS:  09/26/24:  -132/18mmHg 69bpm seated  -132/66mmHg 74bpm standing (no dizziness symptoms)    EDEMA:  Ankle edema bilat, focal to the ATFL area, pt reports as chronic.   ROM ASSESSMENT: *LE not assessed at evaluation, however pt remarks on weakness/pain with seated Right hip flexion needed for car transfers.   -pt demonstrates standing: hip flexion: fatigue/pain on right 1x10 bilat c chair support shows impaired excursion of height on  both sides and progressive loss of height over 8 reps on right side.  *warrants closer assessment of Rt hip to rule out joint disease, neurological weakness, etc. Also reports intermittent knee giving way.   -  Rt knee with mild degenerative valgus, however pt denies typical pain issues.   UPPER EXTREMITY ROM:     Active ROM Right 05/16/24 Left 05/16/24 Right 10/02/24 Left 10/02/24  Shoulder flexion 150 65* active, 120 passive 126 deg 81 deg  Shoulder abduction 165 60* active, 100 passive 153 deg 79 deg  Shoulder internal rotation 60 53    Shoulder external rotation 75 50     MMT BLE 09/27/24:  -Ankle DF: 5/5 bilat -Ankle eversion: 4+/5 bilat c soreness on Rt (lateral ankle edema bilat)  -Ankle inversion: 5/5 bilat  -Ankle PF: 30% ROM rise single LLE; 0% ROM rise on RLE; interpretation: severe strength impairment. -Hip ER: 5/5 bilat  -Hip IR: 5/5 bilat   BALANCE SCREENING 09/27/24: -60sec narrow stance firm surface: low sway  -60sec narrow stance foam: heavy sway, no LOB  -normal stance on incline eyes closed: ~10 sec with posterior sway and LOB  -SLS: >10SEC bilat  TRANSFERS: 07/23/24: 5xSTS: 29.81sec   FUNCTIONAL TESTS:  07/23/24 TUG: 14.51 sec 5xSTS: 29.81 sec : 11.69sec : 114ft  10/02/24:  5xSTS: 16.24 sec : 1189' : 1.04 m/s TUG: 9.99 sec  PATIENT SURVEYS:  05/16/24: Quick DASH: 88.6% 07/23/24: Abc scale 24.38% 10/02/24: ABC Scale: 61.25%                                                                                                                             TREATMENT DATE 11/04/2024:   -overground AMB with 2lb AW bilat, 963ft; 61m37sec  -overground AMB with 2lb AW: forward eyes closed x18ft, backwards eyes open; forward eyes closed x17ft, bakcwards eyes open; side stepping 1x43ft bilat.  -STS from chair x12, hands on knees  *doff AW  -Forward step up 4 firm step to floor to 5 foam step to floor, turn and repeat x12, minGuard assist -soccer  ball rebounding off wall x2 minutes, minGuard assist  -stance on incline overhead rebounding x15    PATIENT EDUCATION: Education details: Relevance of sway variations in testing, specificity to incline imbalance to neuropathy diagnosis.  Person educated: Product/process Development Scientist method: Discussion  Education comprehension: Good   HOME EXERCISE PROGRAM: Access Code: [email or web address] URL: [email or web address] Date: 05/22/2024 Prepared by: Lonni Gainer   Exercises - Seated Shoulder Flexion Towel Slide at Table Top  - 2 x daily - 7 x weekly - 10 reps - 5 sec hold - Seated Shoulder Abduction Towel Slide at Table Top  - 2 x daily - 7 x weekly - 10 reps - 5 sec hold - Supine Isometric Shoulder Extension with Towel  - 2 x daily - 7 x weekly - 2 sets - 10 reps  Access Code: [email or web address] URL: [email or web address] Date: 09/26/2024 Prepared by: Peggye Linear  Exercises - Heel Raises with Counter Support  - 1 x daily - 7 x weekly - 3 sets - 15 reps - Standing March with Counter Support  - 1 x daily - 7 x weekly - 3 sets -  10 reps  GOALS: Goals reviewed with patient? Yes  SHORT TERM GOALS: Target date: 11/18/24  Patient will decrease Quick DASH score to less than 25% demonstrating reduced self-reported upper extremity disability. Baseline: 88.6 %; 07/23/24: 59.1% Goal status: NEW   2.  Patient will demonstrate L shoulder AROM in flexion and ABD > 90 degrees for improved ability to perform overhead activities of ADL and home care. Baseline: flexion- 65 active, abduction- 60 active; 07/02/24: Flexion: 54 deg; Abduction: 48 deg Goal status: NEW  3.  Pt to report independence and compliance with basic HEP for Left shoulder strength, pain management, and balance performance.  Baseline: HEP in place, compliance to follow Goal status: NEW  4.  Pt to demonstrate 5xSTS <18sec and >1224ft to improve baseline cardiorespiratory function and LE power closer to population norm  values in order to maximize reaction time and strength for improved LOB recovery and cognitive wellness.  Baseline: 5xSTS: 29.81 sec, : 1142ft Goal status: NEW  5.  Pt to improve balance confidence AEB >50% improvement on ABC Scale. Baseline: 24.38%  Goal status: NEW  LONG TERM GOALS: Target date: 12/20/23  Pt to demonstrate 5/5 strength in left shoulder extension, ABDCT, ER, IR, and 4/5 left shoulder flexion <3/10 pain.  Baseline:  Goal status: INITIAL  2.  Patient will demonstrate L shoulder AROM in flexion and ABD > 125 degrees for improved ability to perform overhead activities of ADL and home care. Baseline: flexion- 65 active, abduction- 60 active; 07/02/24: Flexion: 54 deg; Abduction: 48 deg Goal status: NEW  3.  Pt to report independence and compliance with advanced, gym or home based exercise regimen for BUE strength, BLE strength, and cardio fitness for continued progress prior to and post discharge.  Baseline: HEP in place, compliance to follow Goal status: NEW Baseline:  Goal status: INITIAL  4.  Pt to demonstrate 5xSTS <12sec and >1525ft to improve baseline cardiorespiratory function and LE power closer to population norm values in order to maximize reaction time and strength for improved LOB recovery and cognitive wellness.  Baseline: 5xSTS: 29.81 sec, : 1142ft Goal status: NEW  5.  Pt to demonstrate ability to perform eyes closed narrow stance balance on foam surface at modI level 3x60sec and eyes closed firm surface inclined stance 3x30sec at modI level.  Baseline: minguard to minA and <10sec respectively;  Goal status: INITIAL  ASSESSMENT:  CLINICAL IMPRESSION: Continued with overground balance and step training. P thas few large LOB in session, but has most difficulty with backward walking and foam step ups. MinGuard ot minA provided for safety. Pt will continue to benefit from skilled therapy to address remaining deficits in order to improve overall QoL  and return to PLOF.    OBJECTIVE IMPAIRMENTS: Decreased knowledge of condition, decreased use of DME, decreased mobility, difficulty walking, decreased strength, decreased ROM. ACTIVITY LIMITATIONS: Lifting, standing, walking, squatting, transfers, locomotion level PARTICIPATION LIMITATIONS: Cleaning, laundry, interpersonal relationships, driving, yardwork, community activity.  PERSONAL FACTORS: Age, behavior pattern, education, past/current experiences, transportation, profession  are also affecting patient's functional outcome.  REHAB POTENTIAL: Good CLINICAL DECISION MAKING: Medium  EVALUATION COMPLEXITY: Moderate    PLAN:  PT FREQUENCY: 1-2x/week   PT DURATION: 12 weeks   PLANNED INTERVENTIONS: 97164- PT Re-evaluation, 97750- Physical Performance Testing, 97110-Therapeutic exercises, 97530- Therapeutic activity, [id number]- Neuromuscular re-education, 97535- Self Care, [id number]- Manual therapy, [phone]- Gait training, [phone]- Electrical stimulation (unattended), [phone]- Electrical stimulation (manual), Patient/Family education, Balance training, Stair training, Joint mobilization, Joint manipulation, Cryotherapy, and  Moist heat  PLAN FOR NEXT SESSION:  Continue with balance training Continue with UE exercises to improve overall ROM and mobility, along with strengthening FU on New HEP items from last visit     1:19 PM, 11/04/2024 Peggye JAYSON Linear, PT, DPT Physical Therapist - [facility]  Outpatient Physical Therapy- Main [facility] [phone]      "

## 2024-11-06 ENCOUNTER — Telehealth: Payer: Self-pay

## 2024-11-06 ENCOUNTER — Ambulatory Visit

## 2024-11-06 NOTE — Telephone Encounter (Signed)
 PT reached out to pt via secure phone due to missed visit/no-show. PT left VM with information about today's missed visit and next appointment date, time, and clinic contact number.  Darryle Patten PT, DPT

## 2024-11-06 NOTE — Therapy (Incomplete)
 " OUTPATIENT PHYSICAL THERAPY TREATMENT  Patient Name: Roberta Roberta Lane MRN: [id number] DOB:03/04/1956, 69 y.o., female Today's Date: 11/06/2024  PCP: Roberta Lynwood FALCON, MD  REFERRING PROVIDER: Valora Lynwood FALCON, MD   END OF SESSION:       Past Medical History:  Diagnosis Date   Abdominal hernia    Allergic genetic state    Aortic ejection murmur    Ascites    Chronic hepatitis C (HCC)    Depression    Diabetes mellitus without complication (HCC)    Dysrhythmia    GERD (gastroesophageal reflux disease)    GIB (gastrointestinal bleeding)    Headache    Hepatic cirrhosis (HCC)    Hepatitis C    Hernia, abdominal    Hyperlipidemia    Hypertension    IDA (iron  deficiency anemia)    Inflamed seborrheic keratosis    Interdigital neuroma of foot    Irregular heart rhythm    Liver disease    Multiple renal cysts    Palpitations    Pancytopenia (HCC)    Portal hypertension with esophageal varices (HCC)    Splenomegaly    UTI (urinary tract infection)    Past Surgical History:  Procedure Laterality Date   ABDOMINAL HYSTERECTOMY     CHOLECYSTECTOMY     COLONOSCOPY WITH PROPOFOL  N/A 12/05/2023   Procedure: COLONOSCOPY WITH PROPOFOL ;  Surgeon: Toledo, Ladell POUR, MD;  Location: [facility];  Service: Gastroenterology;  Laterality: N/A;  DM   ESOPHAGOGASTRODUODENOSCOPY N/A 02/17/2021   Procedure: ESOPHAGOGASTRODUODENOSCOPY (EGD);  Surgeon: Toledo, Ladell POUR, MD;  Location: [facility];  Service: Gastroenterology;  Laterality: N/A;   ESOPHAGOGASTRODUODENOSCOPY (EGD) WITH PROPOFOL  N/A 11/23/2017   Procedure: ESOPHAGOGASTRODUODENOSCOPY (EGD) WITH PROPOFOL ;  Surgeon: Toledo, Ladell POUR, MD;  Location: [facility];  Service: Gastroenterology;  Laterality: N/A;   ESOPHAGOGASTRODUODENOSCOPY (EGD) WITH PROPOFOL  N/A 10/23/2018   Procedure: ESOPHAGOGASTRODUODENOSCOPY (EGD) WITH PROPOFOL ;  Surgeon: Toledo, Ladell POUR, MD;  Location: [facility];  Service: Gastroenterology;   Laterality: N/A;   ESOPHAGOGASTRODUODENOSCOPY (EGD) WITH PROPOFOL  N/A 10/10/2019   Procedure: ESOPHAGOGASTRODUODENOSCOPY (EGD) WITH PROPOFOL ;  Surgeon: Toledo, Ladell POUR, MD;  Location: [facility];  Service: Gastroenterology;  Laterality: N/A;   ESOPHAGOGASTRODUODENOSCOPY (EGD) WITH PROPOFOL  N/A 11/14/2019   Procedure: ESOPHAGOGASTRODUODENOSCOPY (EGD) WITH PROPOFOL ;  Surgeon: Toledo, Ladell POUR, MD;  Location: [facility];  Service: Gastroenterology;  Laterality: N/A;   ESOPHAGOGASTRODUODENOSCOPY (EGD) WITH PROPOFOL  N/A 12/05/2023   Procedure: ESOPHAGOGASTRODUODENOSCOPY (EGD) WITH PROPOFOL ;  Surgeon: Toledo, Ladell POUR, MD;  Location: [facility];  Service: Gastroenterology;  Laterality: N/A;   right elbow surgery     WISDOM TOOTH EXTRACTION     Patient Active Problem List   Diagnosis Date Noted   Renal mass, left 04/11/2020   Hypertension 03/18/2020   Hepatic cirrhosis due to chronic hepatitis C infection (HCC) 11/28/2019   Chronic pain of both knees 03/26/2019   Other ascites 03/26/2019   Aortic stenosis, mild 01/07/2019   Iron  deficiency anemia 12/31/2018   Hepatic encephalopathy (HCC) 07/20/2018   Frequent PVCs 06/13/2018   Portal hypertension with esophageal varices (HCC) 06/12/2018   Aortic ejection murmur 05/29/2018   Dizziness 05/29/2018   Hyperlipidemia, mixed 05/29/2018   Palpitations 05/29/2018   Controlled type 2 diabetes mellitus without complication, without long-term current use of insulin  (HCC) 05/17/2018   Abdominal pain, diffuse 12/14/2017   Gastroesophageal reflux disease without esophagitis 12/14/2017   Pancytopenia (HCC) 12/10/2017   GIB (gastrointestinal bleeding) 11/21/2017    ONSET DATE: Left shoulder injury September 2025; Imbalance Mid 2024 (gradual  progressive onset)    REFERRING DIAG: Left shoulder supraspinatus tear; neuropathy imbalance   THERAPY DIAG:  No diagnosis found.  Rationale for Evaluation and Treatment: Rehabilitation    SUBJECTIVE:                                                                                                                                                                                             SUBJECTIVE STATEMENT: Pt is a little stiff today. She has been using the hot pad on her left shoulder.   PERTINENT HISTORY:   Roberta Roberta Lane is a 68yoF who is referred to OPPT for 2 new problems: pt referred from orthopedics Dr. Marchia regarding a fall in Sept 2024 c subsequent shoulder pain, MRI findings later revealing supraspinatus partial thickness tear. The second referral for imbalance, gait instability comes from PCP Dr. Valora after new onset falls x3-4 since Sept 2024- pt referred to Dr. Lane neurology late 2025 where she is found to have BLE neuropathy from knees down to toes. At baseline pt is fully independent, AMB freely in the community, used to walk her late dog regularly (traumatic passing in Oct 2025).   PROBLEM 1: Shoulder pain/supraspinatus tear Pt sustained a fall in Sept 2024 while stepping onto a bus landing on left shoulder, persistent pain in Left shoulder/forearm, thumb, ultimately seen first for this by PCP, then by orthopedics Dr. Kathlynn 04/17/24, xray showing slight A/C joint separation and exam suggestive of rotator cuff injury, subsequent MRI 04/21/24 revealing of supraspinatus tendinopathy/partial tear, moderate biceps tendinopathy. Ortho recommended 4 weeks of PT at that time. Pt started OPPT here on 05/16/24, initial Quickdash 88.6%. Second fall on 06/29/24 landing on left shoulder again. PT recommended ortho FU and pt took 3 weeks off from PT, returning on 07/23/24 after orthopedic clearance and reassuring imaging/corticosteroid Rx. Pt felts that she was making progress with shoulder until she fell, and desired to regain these improvements. 2nd week November 2025, pt's dog was attacked/killed by a pit bull and pt hurt her Left shoulder again while trying to separate the  dogs.    PROBLEM 2: History of falls/neuropathy  First fall in September 2024, then second fall on 07/02/24 landing on left shoulder again, FU with PCP on 9/18 asking for PT referral for balance issues >1 year. Pt told PCP and hepatology about occasional dizziness, particularly if standing to quickly, however no falls occurring during these episodes. PCP also recommending neurology consult. Pt returned to PT 07/23/24 with new consult from PCP, TUG: 14.51sec, 29.81sec, : 11.69sec, : 1153ft, ABC scale: 24.38%. She experiences occasional imbalance, tripping over her feet, and has a  fear of escalators. She sometimes feels dizzy or lightheaded, particularly when standing up quickly. She has a history of diabetes, with a well-controlled A1c of 5.4 as of last A1C 05/2023.  PAIN:  Are you having pain? 2/10 at rest, left lateral shoulder (worse with A/ROM or functional use); In past week, highest pain: 8/10 (09/26/24)    PRECAUTIONS: None  WEIGHT BEARING RESTRICTIONS: No  FALLS HISTORY:  2 falls since Sept 2024; once while stepping onto an airport shuttle bus, fell backwards and landed on Left shoudler; 2nd when stepping down a curb unaware of its presence and falling onto left shoulder.   LIVING ENVIRONMENT: Lives with: lives alone, neighbors able to stop by if she needs them Lives in: House/apartment Stairs: Yes: Internal: 14 steps; railing on left going up Has following equipment at home: Shower bench   OCCUPATION: Retired- spends her days watching tv   PLOF: Independent  PATIENT GOALS:   1. Shoulder: have less pain in general, be able to freely use BUE for tasks like opening jars   2. Balance: avoid any additional falls, be more confident with curbs, steps  OBJECTIVE:  Note: Objective measures were completed at Evaluation unless otherwise noted.  DIAGNOSTIC FINDINGS:  07/12/24:  Note from Dr. Marchia, [facility]: MRI of the left shoulder was reviewed, showing a high-grade  partial tear of the supraspinatus tendon, measuring 3 to 6 millimeters. Independent interpretation performed by me confirms the partial tear without full detachment.(...)  I recommended non-operative management at this time. A prednisone taper was prescribed to manage inflammation and pain, with instructions to monitor blood sugar levels due to her diabetes. If symptoms do not improve, a steroid injection may be considered. The patient was encouraged to return to therapy and was provided with a new referral.  08/26/24 Note from Dr. 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 Referral to PT for gait and balance.  COGNITION: Overall cognitive status: WNL  VITAL SIGNS:  09/26/24:  -132/47mmHg 69bpm seated  -132/25mmHg 74bpm standing (no dizziness symptoms)    EDEMA:  Ankle edema bilat, focal to the ATFL area, pt reports as chronic.   ROM ASSESSMENT: *LE not assessed at evaluation, however pt remarks on weakness/pain with seated Right hip flexion needed for car transfers.   -pt demonstrates standing: hip flexion: fatigue/pain on right 1x10 bilat c chair support shows impaired excursion of height on both sides and progressive loss of height over 8 reps on right side.  *warrants closer assessment of Rt hip to rule out joint disease, neurological weakness, etc. Also reports intermittent knee giving way.   -Rt knee with mild degenerative valgus, however pt denies typical pain issues.   UPPER EXTREMITY ROM:     Active ROM Right 05/16/24 Left 05/16/24 Right 10/02/24 Left 10/02/24  Shoulder flexion 150 65* active, 120 passive 126 deg 81 deg  Shoulder abduction 165 60* active, 100 passive 153 deg 79 deg  Shoulder internal rotation 60 53    Shoulder external rotation 75 50     MMT BLE 09/27/24:  -Ankle DF:  5/5 bilat -Ankle eversion: 4+/5 bilat c soreness on Rt (lateral ankle edema bilat)  -Ankle inversion: 5/5 bilat  -Ankle PF: 30% ROM rise single LLE; 0% ROM rise on RLE; interpretation: severe strength impairment. -Hip ER: 5/5 bilat  -Hip IR: 5/5 bilat   BALANCE  SCREENING 09/27/24: -60sec narrow stance firm surface: low sway  -60sec narrow stance foam: heavy sway, no LOB  -normal stance on incline eyes closed: ~10 sec with posterior sway and LOB  -SLS: >10SEC bilat  TRANSFERS: 07/23/24: 5xSTS: 29.81sec   FUNCTIONAL TESTS:  07/23/24 TUG: 14.51 sec 5xSTS: 29.81 sec : 11.69sec : 1152ft  10/02/24:  5xSTS: 16.24 sec : 1189' : 1.04 m/s TUG: 9.99 sec  PATIENT SURVEYS:  05/16/24: Quick DASH: 88.6% 07/23/24: Abc scale 24.38% 10/02/24: ABC Scale: 61.25%                                                                                                                             TREATMENT DATE 11/06/2024:    Continue with balance training Continue with UE exercises to improve overall ROM and mobility, along with strengthening FU on New HEP items from last visit   TherAct:   Airex pad orange hurdle step over 15x each LE Airex pad lateral step over hurdle 15x each LE  Sit to stand with airex pad under feet 10x  Backwards walking 8x length of // bars with cues for upright posture Speed ladder:  -one step per square x12 lengths no UE support -lateral step per square x 12 lengths no UE support  Exercises - Heel Raises with Counter Support  - 1 x daily - 7 x weekly - 3 sets - 15 reps - Standing March with Counter Support  - 1 x daily - 7 x weekly - 3 sets - 10 reps  PREVIOUS -overground AMB with 2lb AW bilat, 954ft; 51m37sec  -overground AMB with 2lb AW: forward eyes closed x53ft, backwards eyes open; forward eyes closed x15ft, bakcwards eyes open; side stepping 1x24ft bilat.  -STS from chair x12, hands on knees  *doff AW  -Forward step up 4 firm step to floor to 5  foam step to floor, turn and repeat x12, minGuard assist -soccer ball rebounding off wall x2 minutes, minGuard assist  -stance on incline overhead rebounding x15    PATIENT EDUCATION: Education details: Relevance of sway variations in testing, specificity to incline imbalance to neuropathy diagnosis.  Person educated: Product/process Development Scientist method: Discussion  Education comprehension: Good   HOME EXERCISE PROGRAM: Access Code: [email or web address] URL: [email or web address] Date: 05/22/2024 Prepared by: Lonni Gainer   Exercises - Seated Shoulder Flexion Towel Slide at Table Top  - 2 x daily - 7 x weekly - 10 reps - 5 sec hold - Seated Shoulder Abduction Towel Slide at Table Top  - 2 x daily - 7 x weekly - 10 reps - 5 sec hold - Supine Isometric Shoulder Extension with Towel  - 2 x daily - 7 x weekly - 2 sets - 10 reps  Access Code: [email or web address] URL: [email or web address] Date: 09/26/2024 Prepared by: Peggye Linear  Exercises - Heel Raises with Counter Support  - 1 x daily - 7 x weekly - 3 sets - 15 reps - Standing March with Counter  Support  - 1 x daily - 7 x weekly - 3 sets - 10 reps  GOALS: Goals reviewed with patient? Yes  SHORT TERM GOALS: Target date: 11/18/24  Patient will decrease Quick DASH score to less than 25% demonstrating reduced self-reported upper extremity disability. Baseline: 88.6 %; 07/23/24: 59.1% Goal status: NEW   2.  Patient will demonstrate L shoulder AROM in flexion and ABD > 90 degrees for improved ability to perform overhead activities of ADL and home care. Baseline: flexion- 65 active, abduction- 60 active; 07/02/24: Flexion: 54 deg; Abduction: 48 deg Goal status: NEW  3.  Pt to report independence and compliance with basic HEP for Left shoulder strength, pain management, and balance performance.  Baseline: HEP in place, compliance to follow Goal status: NEW  4.  Pt to demonstrate 5xSTS <18sec and >1239ft to improve baseline  cardiorespiratory function and LE power closer to population norm values in order to maximize reaction time and strength for improved LOB recovery and cognitive wellness.  Baseline: 5xSTS: 29.81 sec, : 115ft Goal status: NEW  5.  Pt to improve balance confidence AEB >50% improvement on ABC Scale. Baseline: 24.38%  Goal status: NEW  LONG TERM GOALS: Target date: 12/20/23  Pt to demonstrate 5/5 strength in left shoulder extension, ABDCT, ER, IR, and 4/5 left shoulder flexion <3/10 pain.  Baseline:  Goal status: INITIAL  2.  Patient will demonstrate L shoulder AROM in flexion and ABD > 125 degrees for improved ability to perform overhead activities of ADL and home care. Baseline: flexion- 65 active, abduction- 60 active; 07/02/24: Flexion: 54 deg; Abduction: 48 deg Goal status: NEW  3.  Pt to report independence and compliance with advanced, gym or home based exercise regimen for BUE strength, BLE strength, and cardio fitness for continued progress prior to and post discharge.  Baseline: HEP in place, compliance to follow Goal status: NEW Baseline:  Goal status: INITIAL  4.  Pt to demonstrate 5xSTS <12sec and >1560ft to improve baseline cardiorespiratory function and LE power closer to population norm values in order to maximize reaction time and strength for improved LOB recovery and cognitive wellness.  Baseline: 5xSTS: 29.81 sec, : 1139ft Goal status: NEW  5.  Pt to demonstrate ability to perform eyes closed narrow stance balance on foam surface at modI level 3x60sec and eyes closed firm surface inclined stance 3x30sec at modI level.  Baseline: minguard to minA and <10sec respectively;  Goal status: INITIAL  ASSESSMENT:  CLINICAL IMPRESSION: Continued with overground balance and step training. P thas few large LOB in session, but has most difficulty with backward walking and foam step ups. MinGuard ot minA provided for safety. Pt will continue to benefit from skilled  therapy to address remaining deficits in order to improve overall QoL and return to PLOF.    OBJECTIVE IMPAIRMENTS: Decreased knowledge of condition, decreased use of DME, decreased mobility, difficulty walking, decreased strength, decreased ROM. ACTIVITY LIMITATIONS: Lifting, standing, walking, squatting, transfers, locomotion level PARTICIPATION LIMITATIONS: Cleaning, laundry, interpersonal relationships, driving, yardwork, community activity.  PERSONAL FACTORS: Age, behavior pattern, education, past/current experiences, transportation, profession  are also affecting patient's functional outcome.  REHAB POTENTIAL: Good CLINICAL DECISION MAKING: Medium  EVALUATION COMPLEXITY: Moderate    PLAN:  PT FREQUENCY: 1-2x/week   PT DURATION: 12 weeks   PLANNED INTERVENTIONS: [id number]- PT Re-evaluation, 97750- Physical Performance Testing, 97110-Therapeutic exercises, 97530- Therapeutic activity, [id number]- Neuromuscular re-education, 97535- Self Care, [id number]- Manual therapy, [id number]- Gait training, [phone]- Electrical stimulation (unattended), [id number]- Electrical  stimulation (manual), Patient/Family education, Balance training, Stair training, Joint mobilization, Joint manipulation, Cryotherapy, and Moist heat  PLAN FOR NEXT SESSION:  Continue with balance training Continue with UE exercises to improve overall ROM and mobility, along with strengthening FU on New HEP items from last visit     1:02 PM, 11/06/2024 Peggye JAYSON Linear, PT, DPT Physical Therapist - [facility]  Outpatient Physical Therapy- Main Campus [phone]      "

## 2024-11-11 ENCOUNTER — Ambulatory Visit

## 2024-11-11 DIAGNOSIS — M6281 Muscle weakness (generalized): Secondary | ICD-10-CM

## 2024-11-11 DIAGNOSIS — R2681 Unsteadiness on feet: Secondary | ICD-10-CM | POA: Diagnosis not present

## 2024-11-11 DIAGNOSIS — R262 Difficulty in walking, not elsewhere classified: Secondary | ICD-10-CM

## 2024-11-11 DIAGNOSIS — G8929 Other chronic pain: Secondary | ICD-10-CM

## 2024-11-11 DIAGNOSIS — R2689 Other abnormalities of gait and mobility: Secondary | ICD-10-CM

## 2024-11-11 DIAGNOSIS — M25612 Stiffness of left shoulder, not elsewhere classified: Secondary | ICD-10-CM

## 2024-11-11 NOTE — Therapy (Signed)
 " OUTPATIENT PHYSICAL THERAPY TREATMENT  Patient Name: Roberta Lane MRN: [id number] DOB:September 18, 1956, 69 y.o., female Today's Date: 11/11/2024  PCP: Valora Lynwood FALCON, MD  REFERRING PROVIDER: Valora Lynwood FALCON, MD   END OF SESSION:  PT End of Session - 11/11/24 1322     Visit Number 6    Number of Visits 24    Date for Recertification  12/19/24    Authorization Type Medicare/ BCBS secondary    Authorization Time Period 09/26/24-12/19/24    Progress Note Due on Visit 10    PT Start Time 1320    PT Stop Time 1359    PT Time Calculation (min) 39 min    Equipment Utilized During Treatment Gait belt    Activity Tolerance Patient tolerated treatment well;No increased pain    Behavior During Therapy WFL for tasks assessed/performed             Past Medical History:  Diagnosis Date   Abdominal hernia    Allergic genetic state    Aortic ejection murmur    Ascites    Chronic hepatitis C (HCC)    Depression    Diabetes mellitus without complication (HCC)    Dysrhythmia    GERD (gastroesophageal reflux disease)    GIB (gastrointestinal bleeding)    Headache    Hepatic cirrhosis (HCC)    Hepatitis C    Hernia, abdominal    Hyperlipidemia    Hypertension    IDA (iron  deficiency anemia)    Inflamed seborrheic keratosis    Interdigital neuroma of foot    Irregular heart rhythm    Liver disease    Multiple renal cysts    Palpitations    Pancytopenia (HCC)    Portal hypertension with esophageal varices (HCC)    Splenomegaly    UTI (urinary tract infection)    Past Surgical History:  Procedure Laterality Date   ABDOMINAL HYSTERECTOMY     CHOLECYSTECTOMY     COLONOSCOPY WITH PROPOFOL  N/A 12/05/2023   Procedure: COLONOSCOPY WITH PROPOFOL ;  Surgeon: Toledo, Ladell POUR, MD;  Location: [facility];  Service: Gastroenterology;  Laterality: N/A;  DM   ESOPHAGOGASTRODUODENOSCOPY N/A 02/17/2021   Procedure: ESOPHAGOGASTRODUODENOSCOPY (EGD);  Surgeon: Toledo, Ladell POUR, MD;   Location: [facility];  Service: Gastroenterology;  Laterality: N/A;   ESOPHAGOGASTRODUODENOSCOPY (EGD) WITH PROPOFOL  N/A 11/23/2017   Procedure: ESOPHAGOGASTRODUODENOSCOPY (EGD) WITH PROPOFOL ;  Surgeon: Toledo, Ladell POUR, MD;  Location: [facility];  Service: Gastroenterology;  Laterality: N/A;   ESOPHAGOGASTRODUODENOSCOPY (EGD) WITH PROPOFOL  N/A 10/23/2018   Procedure: ESOPHAGOGASTRODUODENOSCOPY (EGD) WITH PROPOFOL ;  Surgeon: Toledo, Ladell POUR, MD;  Location: [facility];  Service: Gastroenterology;  Laterality: N/A;   ESOPHAGOGASTRODUODENOSCOPY (EGD) WITH PROPOFOL  N/A 10/10/2019   Procedure: ESOPHAGOGASTRODUODENOSCOPY (EGD) WITH PROPOFOL ;  Surgeon: Toledo, Ladell POUR, MD;  Location: [facility];  Service: Gastroenterology;  Laterality: N/A;   ESOPHAGOGASTRODUODENOSCOPY (EGD) WITH PROPOFOL  N/A 11/14/2019   Procedure: ESOPHAGOGASTRODUODENOSCOPY (EGD) WITH PROPOFOL ;  Surgeon: Toledo, Ladell POUR, MD;  Location: [facility];  Service: Gastroenterology;  Laterality: N/A;   ESOPHAGOGASTRODUODENOSCOPY (EGD) WITH PROPOFOL  N/A 12/05/2023   Procedure: ESOPHAGOGASTRODUODENOSCOPY (EGD) WITH PROPOFOL ;  Surgeon: Toledo, Ladell POUR, MD;  Location: [facility];  Service: Gastroenterology;  Laterality: N/A;   right elbow surgery     WISDOM TOOTH EXTRACTION     Patient Active Problem List   Diagnosis Date Noted   Renal mass, left 04/11/2020   Hypertension 03/18/2020   Hepatic cirrhosis due to chronic hepatitis C infection (HCC) 11/28/2019   Chronic pain of both knees  03/26/2019   Other ascites 03/26/2019   Aortic stenosis, mild 01/07/2019   Iron  deficiency anemia 12/31/2018   Hepatic encephalopathy (HCC) 07/20/2018   Frequent PVCs 06/13/2018   Portal hypertension with esophageal varices (HCC) 06/12/2018   Aortic ejection murmur 05/29/2018   Dizziness 05/29/2018   Hyperlipidemia, mixed 05/29/2018   Palpitations 05/29/2018   Controlled type 2 diabetes mellitus without complication, without  long-term current use of insulin  (HCC) 05/17/2018   Abdominal pain, diffuse 12/14/2017   Gastroesophageal reflux disease without esophagitis 12/14/2017   Pancytopenia (HCC) 12/10/2017   GIB (gastrointestinal bleeding) 11/21/2017    ONSET DATE: Left shoulder injury September 2025; Imbalance Mid 2024 (gradual progressive onset)    REFERRING DIAG: Left shoulder supraspinatus tear; neuropathy imbalance   THERAPY DIAG:  Muscle weakness (generalized)  Difficulty in walking, not elsewhere classified  Imbalance  Chronic left shoulder pain  Decreased range of motion of shoulder, left  Other abnormalities of gait and mobility  Rationale for Evaluation and Treatment: Rehabilitation   SUBJECTIVE:                                                                                                                                                                                             SUBJECTIVE STATEMENT: Pt reports doing well today. She reports she has been cleaning her home today. She does feel stiff.   PERTINENT HISTORY:   Roberta Lane is a 68yoF who is referred to OPPT for 2 new problems: pt referred from orthopedics Dr. Marchia regarding a fall in Sept 2024 c subsequent shoulder pain, MRI findings later revealing supraspinatus partial thickness tear. The second referral for imbalance, gait instability comes from PCP Dr. Valora after new onset falls x3-4 since Sept 2024- pt referred to Dr. Lane neurology late 2025 where she is found to have BLE neuropathy from knees down to toes. At baseline pt is fully independent, AMB freely in the community, used to walk her late dog regularly (traumatic passing in Oct 2025).   PROBLEM 1: Shoulder pain/supraspinatus tear Pt sustained a fall in Sept 2024 while stepping onto a bus landing on left shoulder, persistent pain in Left shoulder/forearm, thumb, ultimately seen first for this by PCP, then by orthopedics Dr. Kathlynn 04/17/24, xray showing  slight A/C joint separation and exam suggestive of rotator cuff injury, subsequent MRI 04/21/24 revealing of supraspinatus tendinopathy/partial tear, moderate biceps tendinopathy. Ortho recommended 4 weeks of PT at that time. Pt started OPPT here on 05/16/24, initial Quickdash 88.6%. Second fall on 06/29/24 landing on left shoulder again. PT recommended ortho FU and pt took 3 weeks off from PT, returning on 07/23/24 after  orthopedic clearance and reassuring imaging/corticosteroid Rx. Pt felts that she was making progress with shoulder until she fell, and desired to regain these improvements. 2nd week November 2025, pt's dog was attacked/killed by a pit bull and pt hurt her Left shoulder again while trying to separate the dogs.    PROBLEM 2: History of falls/neuropathy  First fall in September 2024, then second fall on 07/02/24 landing on left shoulder again, FU with PCP on 9/18 asking for PT referral for balance issues >1 year. Pt told PCP and hepatology about occasional dizziness, particularly if standing to quickly, however no falls occurring during these episodes. PCP also recommending neurology consult. Pt returned to PT 07/23/24 with new consult from PCP, TUG: 14.51sec, 29.81sec, : 11.69sec, : 1170ft, ABC scale: 24.38%. She experiences occasional imbalance, tripping over her feet, and has a fear of escalators. She sometimes feels dizzy or lightheaded, particularly when standing up quickly. She has a history of diabetes, with a well-controlled A1c of 5.4 as of last A1C 05/2023.  PAIN:  Are you having pain? 2/10 at rest, left lateral shoulder (worse with A/ROM or functional use); In past week, highest pain: 8/10 (09/26/24)    PRECAUTIONS: None  WEIGHT BEARING RESTRICTIONS: No  FALLS HISTORY:  2 falls since Sept 2024; once while stepping onto an airport shuttle bus, fell backwards and landed on Left shoudler; 2nd when stepping down a curb unaware of its presence and falling onto left shoulder.    LIVING ENVIRONMENT: Lives with: lives alone, neighbors able to stop by if she needs them Lives in: House/apartment Stairs: Yes: Internal: 14 steps; railing on left going up Has following equipment at home: Shower bench   OCCUPATION: Retired- spends her days watching tv   PLOF: Independent  PATIENT GOALS:   1. Shoulder: have less pain in general, be able to freely use BUE for tasks like opening jars   2. Balance: avoid any additional falls, be more confident with curbs, steps  OBJECTIVE:  Note: Objective measures were completed at Evaluation unless otherwise noted.  DIAGNOSTIC FINDINGS:  07/12/24:  Note from Dr. Marchia, [facility]: MRI of the left shoulder was reviewed, showing a high-grade partial tear of the supraspinatus tendon, measuring 3 to 6 millimeters. Independent interpretation performed by me confirms the partial tear without full detachment.(...)  I recommended non-operative management at this time. A prednisone taper was prescribed to manage inflammation and pain, with instructions to monitor blood sugar levels due to her diabetes. If symptoms do not improve, a steroid injection may be considered. The patient was encouraged to return to therapy and was provided with a new referral.  08/26/24 Note from Dr. 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 Referral to PT for gait and balance.  COGNITION: Overall cognitive status: WNL  VITAL SIGNS:  09/26/24:  -132/65mmHg 69bpm seated  -132/30mmHg 74bpm standing (no dizziness symptoms)    EDEMA:  Ankle edema bilat, focal to the ATFL area, pt reports as chronic.   ROM ASSESSMENT: *LE not assessed at evaluation, however pt remarks on weakness/pain with seated Right hip flexion needed for car transfers.   -pt demonstrates standing:  hip flexion: fatigue/pain on right 1x10 bilat c chair support shows impaired excursion of height on both sides and progressive loss of height over 8 reps on right side.  *warrants closer assessment of Rt hip  to rule out joint disease, neurological weakness, etc. Also reports intermittent knee giving way.   -Rt knee with mild degenerative valgus, however pt denies typical pain issues.   UPPER EXTREMITY ROM:     Active ROM Right 05/16/24 Left 05/16/24 Right 10/02/24 Left 10/02/24  Shoulder flexion 150 65* active, 120 passive 126 deg 81 deg  Shoulder abduction 165 60* active, 100 passive 153 deg 79 deg  Shoulder internal rotation 60 53    Shoulder external rotation 75 50     MMT BLE 09/27/24:  -Ankle DF: 5/5 bilat -Ankle eversion: 4+/5 bilat c soreness on Rt (lateral ankle edema bilat)  -Ankle inversion: 5/5 bilat  -Ankle PF: 30% ROM rise single LLE; 0% ROM rise on RLE; interpretation: severe strength impairment. -Hip ER: 5/5 bilat  -Hip IR: 5/5 bilat   BALANCE SCREENING 09/27/24: -60sec narrow stance firm surface: low sway  -60sec narrow stance foam: heavy sway, no LOB  -normal stance on incline eyes closed: ~10 sec with posterior sway and LOB  -SLS: >10SEC bilat  TRANSFERS: 07/23/24: 5xSTS: 29.81sec   FUNCTIONAL TESTS:  07/23/24 TUG: 14.51 sec 5xSTS: 29.81 sec : 11.69sec : 1144ft  10/02/24:  5xSTS: 16.24 sec : 1189' : 1.04 m/s TUG: 9.99 sec  PATIENT SURVEYS:  05/16/24: Quick DASH: 88.6% 07/23/24: Abc scale 24.38% 10/02/24: ABC Scale: 61.25%                                                                                                                             TREATMENT DATE 11/11/24:   -STS hands on knees 2x10.   -GT overground with 3 lb. AW - 600'   -GT overground with 3 lb. AW -450'   -GT overground with 3 lb. AW - 300'   -Stairs x 6 laps.   -Cone taps in // bars while standing on airex x20  -Sit to stands x10 without UE use.      PATIENT EDUCATION: Education details: Relevance of sway variations in testing, specificity to incline imbalance to neuropathy diagnosis.  Person educated: Product/process Development Scientist method: Discussion  Education comprehension: Good   HOME EXERCISE PROGRAM: Access Code: [email or web address] URL: [email or web address] Date: 05/22/2024 Prepared by: Lonni Gainer   Exercises - Seated Shoulder Flexion Towel Slide at Table Top  - 2 x daily - 7 x weekly - 10 reps - 5 sec hold - Seated Shoulder Abduction Towel Slide at Table Top  - 2 x daily - 7 x weekly - 10 reps - 5 sec hold - Supine Isometric Shoulder Extension with Towel  - 2 x daily - 7 x weekly - 2 sets - 10 reps  Access Code: [email or web address] URL: [email or web address] Date: 09/26/2024 Prepared by: Peggye Linear  Exercises - Heel Raises with Counter Support  - 1 x daily - 7 x weekly - 3 sets - 15 reps - Standing March with Counter Support  - 1 x daily - 7 x weekly - 3 sets - 10 reps  GOALS: Goals reviewed with patient? Yes  SHORT TERM GOALS: Target date: 11/18/24  Patient will decrease Quick DASH score to less than 25% demonstrating reduced self-reported upper extremity disability. Baseline: 88.6 %; 07/23/24: 59.1% Goal status: NEW   2.  Patient will demonstrate L shoulder AROM in flexion and ABD > 90 degrees for improved ability to perform overhead activities of ADL and home care. Baseline: flexion- 65 active, abduction- 60 active; 07/02/24: Flexion: 54 deg; Abduction: 48 deg Goal status: NEW  3.  Pt to report independence and compliance with basic HEP for Left shoulder strength, pain management, and balance performance.  Baseline: HEP in place, compliance to follow Goal status: NEW  4.  Pt to demonstrate 5xSTS <18sec and >1241ft to improve baseline cardiorespiratory function and LE power closer to population norm values in order to maximize reaction time and strength for improved LOB recovery and cognitive  wellness.  Baseline: 5xSTS: 29.81 sec, : 1126ft Goal status: NEW  5.  Pt to improve balance confidence AEB >50% improvement on ABC Scale. Baseline: 24.38%  Goal status: NEW  LONG TERM GOALS: Target date: 12/20/23  Pt to demonstrate 5/5 strength in left shoulder extension, ABDCT, ER, IR, and 4/5 left shoulder flexion <3/10 pain.  Baseline:  Goal status: INITIAL  2.  Patient will demonstrate L shoulder AROM in flexion and ABD > 125 degrees for improved ability to perform overhead activities of ADL and home care. Baseline: flexion- 65 active, abduction- 60 active; 07/02/24: Flexion: 54 deg; Abduction: 48 deg Goal status: NEW  3.  Pt to report independence and compliance with advanced, gym or home based exercise regimen for BUE strength, BLE strength, and cardio fitness for continued progress prior to and post discharge.  Baseline: HEP in place, compliance to follow Goal status: NEW Baseline:  Goal status: INITIAL  4.  Pt to demonstrate 5xSTS <12sec and >151ft to improve baseline cardiorespiratory function and LE power closer to population norm values in order to maximize reaction time and strength for improved LOB recovery and cognitive wellness.  Baseline: 5xSTS: 29.81 sec, : 113ft Goal status: NEW  5.  Pt to demonstrate ability to perform eyes closed narrow stance balance on foam surface at modI level 3x60sec and eyes closed firm surface inclined stance 3x30sec at modI level.  Baseline: minguard to minA and <10sec respectively;  Goal status: INITIAL  ASSESSMENT:  CLINICAL IMPRESSION: Continued with overground balance, gait and step training. Pt performed well today throughout treatment with only a few mild LOB with independent recovery.  Poor confidence in balance noted with cone taps while standing on airex, but did improve with practice today. Pt will continue to benefit from skilled therapy to address remaining deficits in order to improve overall QoL and return to  PLOF.    OBJECTIVE IMPAIRMENTS: Decreased knowledge of condition, decreased use of DME, decreased mobility, difficulty walking, decreased strength, decreased ROM. ACTIVITY LIMITATIONS: Lifting, standing, walking, squatting, transfers, locomotion level PARTICIPATION LIMITATIONS: Cleaning, laundry, interpersonal relationships, driving, yardwork, community activity.  PERSONAL FACTORS: Age, behavior pattern, education, past/current experiences, transportation, profession  are also affecting patient's functional outcome.  REHAB POTENTIAL: Good CLINICAL DECISION MAKING: Medium  EVALUATION COMPLEXITY: Moderate    PLAN:  PT FREQUENCY: 1-2x/week   PT DURATION: 12 weeks   PLANNED INTERVENTIONS: [id number]- PT Re-evaluation, 97750- Physical Performance Testing, 97110-Therapeutic exercises, 97530- Therapeutic activity, [id number]- Neuromuscular re-education, 97535- Self Care, [id number]- Manual therapy, [id number]- Gait training, [phone]- Electrical stimulation (unattended), [phone]- Electrical stimulation (manual), Patient/Family education, Balance training,  Stair training, Joint mobilization, Joint manipulation, Cryotherapy, and Moist heat  PLAN FOR NEXT SESSION:  Continue with balance training Continue with UE exercises to improve overall ROM and mobility, along with strengthening FU on New HEP items from last visit   Norman Sharps, PT, DPT Physical Therapist - [facility]   2:02 PM, 11/11/24    "

## 2024-11-12 NOTE — Therapy (Signed)
 " OUTPATIENT PHYSICAL THERAPY TREATMENT  Patient Name: Yianna Tersigni MRN: [id number] DOB:June 09, 1956, 69 y.o., female Today's Date: 11/13/2024  PCP: Valora Lynwood FALCON, MD  REFERRING PROVIDER: Valora Lynwood FALCON, MD   END OF SESSION:  PT End of Session - 11/13/24 1318     Visit Number 7    Number of Visits 24    Date for Recertification  12/19/24    Authorization Type Medicare/ BCBS secondary    Authorization Time Period 09/26/24-12/19/24    Progress Note Due on Visit 10    PT Start Time 1316    PT Stop Time 1359    PT Time Calculation (min) 43 min    Equipment Utilized During Treatment Gait belt    Activity Tolerance Patient tolerated treatment well;No increased pain    Behavior During Therapy WFL for tasks assessed/performed              Past Medical History:  Diagnosis Date   Abdominal hernia    Allergic genetic state    Aortic ejection murmur    Ascites    Chronic hepatitis C (HCC)    Depression    Diabetes mellitus without complication (HCC)    Dysrhythmia    GERD (gastroesophageal reflux disease)    GIB (gastrointestinal bleeding)    Headache    Hepatic cirrhosis (HCC)    Hepatitis C    Hernia, abdominal    Hyperlipidemia    Hypertension    IDA (iron  deficiency anemia)    Inflamed seborrheic keratosis    Interdigital neuroma of foot    Irregular heart rhythm    Liver disease    Multiple renal cysts    Palpitations    Pancytopenia (HCC)    Portal hypertension with esophageal varices (HCC)    Splenomegaly    UTI (urinary tract infection)    Past Surgical History:  Procedure Laterality Date   ABDOMINAL HYSTERECTOMY     CHOLECYSTECTOMY     COLONOSCOPY WITH PROPOFOL  N/A 12/05/2023   Procedure: COLONOSCOPY WITH PROPOFOL ;  Surgeon: Toledo, Ladell POUR, MD;  Location: [facility];  Service: Gastroenterology;  Laterality: N/A;  DM   ESOPHAGOGASTRODUODENOSCOPY N/A 02/17/2021   Procedure: ESOPHAGOGASTRODUODENOSCOPY (EGD);  Surgeon: Toledo, Ladell POUR, MD;   Location: [facility];  Service: Gastroenterology;  Laterality: N/A;   ESOPHAGOGASTRODUODENOSCOPY (EGD) WITH PROPOFOL  N/A 11/23/2017   Procedure: ESOPHAGOGASTRODUODENOSCOPY (EGD) WITH PROPOFOL ;  Surgeon: Toledo, Ladell POUR, MD;  Location: [facility];  Service: Gastroenterology;  Laterality: N/A;   ESOPHAGOGASTRODUODENOSCOPY (EGD) WITH PROPOFOL  N/A 10/23/2018   Procedure: ESOPHAGOGASTRODUODENOSCOPY (EGD) WITH PROPOFOL ;  Surgeon: Toledo, Ladell POUR, MD;  Location: [facility];  Service: Gastroenterology;  Laterality: N/A;   ESOPHAGOGASTRODUODENOSCOPY (EGD) WITH PROPOFOL  N/A 10/10/2019   Procedure: ESOPHAGOGASTRODUODENOSCOPY (EGD) WITH PROPOFOL ;  Surgeon: Toledo, Ladell POUR, MD;  Location: [facility];  Service: Gastroenterology;  Laterality: N/A;   ESOPHAGOGASTRODUODENOSCOPY (EGD) WITH PROPOFOL  N/A 11/14/2019   Procedure: ESOPHAGOGASTRODUODENOSCOPY (EGD) WITH PROPOFOL ;  Surgeon: Toledo, Ladell POUR, MD;  Location: [facility];  Service: Gastroenterology;  Laterality: N/A;   ESOPHAGOGASTRODUODENOSCOPY (EGD) WITH PROPOFOL  N/A 12/05/2023   Procedure: ESOPHAGOGASTRODUODENOSCOPY (EGD) WITH PROPOFOL ;  Surgeon: Toledo, Ladell POUR, MD;  Location: [facility];  Service: Gastroenterology;  Laterality: N/A;   right elbow surgery     WISDOM TOOTH EXTRACTION     Patient Active Problem List   Diagnosis Date Noted   Renal mass, left 04/11/2020   Hypertension 03/18/2020   Hepatic cirrhosis due to chronic hepatitis C infection (HCC) 11/28/2019   Chronic pain of both  knees 03/26/2019   Other ascites 03/26/2019   Aortic stenosis, mild 01/07/2019   Iron  deficiency anemia 12/31/2018   Hepatic encephalopathy (HCC) 07/20/2018   Frequent PVCs 06/13/2018   Portal hypertension with esophageal varices (HCC) 06/12/2018   Aortic ejection murmur 05/29/2018   Dizziness 05/29/2018   Hyperlipidemia, mixed 05/29/2018   Palpitations 05/29/2018   Controlled type 2 diabetes mellitus without complication, without  long-term current use of insulin  (HCC) 05/17/2018   Abdominal pain, diffuse 12/14/2017   Gastroesophageal reflux disease without esophagitis 12/14/2017   Pancytopenia (HCC) 12/10/2017   GIB (gastrointestinal bleeding) 11/21/2017    ONSET DATE: Left shoulder injury September 2025; Imbalance Mid 2024 (gradual progressive onset)    REFERRING DIAG: Left shoulder supraspinatus tear; neuropathy imbalance   THERAPY DIAG:  Muscle weakness (generalized)  Difficulty in walking, not elsewhere classified  Imbalance  Chronic left shoulder pain  Rationale for Evaluation and Treatment: Rehabilitation   SUBJECTIVE:                                                                                                                                                                                             SUBJECTIVE STATEMENT: Patient is still going court appearances for her dog attack.   PERTINENT HISTORY:   Keionna Kinnaird is a 68yoF who is referred to OPPT for 2 new problems: pt referred from orthopedics Dr. Marchia regarding a fall in Sept 2024 c subsequent shoulder pain, MRI findings later revealing supraspinatus partial thickness tear. The second referral for imbalance, gait instability comes from PCP Dr. Valora after new onset falls x3-4 since Sept 2024- pt referred to Dr. Lane neurology late 2025 where she is found to have BLE neuropathy from knees down to toes. At baseline pt is fully independent, AMB freely in the community, used to walk her late dog regularly (traumatic passing in Oct 2025).   PROBLEM 1: Shoulder pain/supraspinatus tear Pt sustained a fall in Sept 2024 while stepping onto a bus landing on left shoulder, persistent pain in Left shoulder/forearm, thumb, ultimately seen first for this by PCP, then by orthopedics Dr. Kathlynn 04/17/24, xray showing slight A/C joint separation and exam suggestive of rotator cuff injury, subsequent MRI 04/21/24 revealing of supraspinatus  tendinopathy/partial tear, moderate biceps tendinopathy. Ortho recommended 4 weeks of PT at that time. Pt started OPPT here on 05/16/24, initial Quickdash 88.6%. Second fall on 06/29/24 landing on left shoulder again. PT recommended ortho FU and pt took 3 weeks off from PT, returning on 07/23/24 after orthopedic clearance and reassuring imaging/corticosteroid Rx. Pt felts that she was making progress with shoulder until she fell, and desired to regain  these improvements. 2nd week November 2025, pt's dog was attacked/killed by a pit bull and pt hurt her Left shoulder again while trying to separate the dogs.    PROBLEM 2: History of falls/neuropathy  First fall in September 2024, then second fall on 07/02/24 landing on left shoulder again, FU with PCP on 9/18 asking for PT referral for balance issues >1 year. Pt told PCP and hepatology about occasional dizziness, particularly if standing to quickly, however no falls occurring during these episodes. PCP also recommending neurology consult. Pt returned to PT 07/23/24 with new consult from PCP, TUG: 14.51sec, 29.81sec, : 11.69sec, : 1167ft, ABC scale: 24.38%. She experiences occasional imbalance, tripping over her feet, and has a fear of escalators. She sometimes feels dizzy or lightheaded, particularly when standing up quickly. She has a history of diabetes, with a well-controlled A1c of 5.4 as of last A1C 05/2023.  PAIN:  Are you having pain? 2/10 at rest, left lateral shoulder (worse with A/ROM or functional use); In past week, highest pain: 8/10 (09/26/24)    PRECAUTIONS: None  WEIGHT BEARING RESTRICTIONS: No  FALLS HISTORY:  2 falls since Sept 2024; once while stepping onto an airport shuttle bus, fell backwards and landed on Left shoudler; 2nd when stepping down a curb unaware of its presence and falling onto left shoulder.   LIVING ENVIRONMENT: Lives with: lives alone, neighbors able to stop by if she needs them Lives in: House/apartment Stairs:  Yes: Internal: 14 steps; railing on left going up Has following equipment at home: Shower bench   OCCUPATION: Retired- spends her days watching tv   PLOF: Independent  PATIENT GOALS:   1. Shoulder: have less pain in general, be able to freely use BUE for tasks like opening jars   2. Balance: avoid any additional falls, be more confident with curbs, steps  OBJECTIVE:  Note: Objective measures were completed at Evaluation unless otherwise noted.  DIAGNOSTIC FINDINGS:  07/12/24:  Note from Dr. Marchia, [facility]: MRI of the left shoulder was reviewed, showing a high-grade partial tear of the supraspinatus tendon, measuring 3 to 6 millimeters. Independent interpretation performed by me confirms the partial tear without full detachment.(...)  I recommended non-operative management at this time. A prednisone taper was prescribed to manage inflammation and pain, with instructions to monitor blood sugar levels due to her diabetes. If symptoms do not improve, a steroid injection may be considered. The patient was encouraged to return to therapy and was provided with a new referral.  08/26/24 Note from Dr. 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 Referral to PT for gait and balance.  COGNITION: Overall cognitive status: WNL  VITAL SIGNS:  09/26/24:  -132/82mmHg 69bpm seated  -132/8mmHg 74bpm standing (no dizziness symptoms)    EDEMA:  Ankle edema bilat, focal to the ATFL area, pt reports as chronic.   ROM ASSESSMENT: *LE not assessed at evaluation, however pt remarks on weakness/pain with seated Right hip flexion needed for car transfers.   -pt demonstrates standing: hip flexion: fatigue/pain on right 1x10 bilat c chair support shows impaired excursion of height on both sides and progressive  loss of height over 8 reps on right side.  *warrants closer assessment of Rt hip to rule out joint disease, neurological weakness, etc. Also reports intermittent knee giving way.   -Rt knee with mild degenerative valgus,  however pt denies typical pain issues.   UPPER EXTREMITY ROM:     Active ROM Right 05/16/24 Left 05/16/24 Right 10/02/24 Left 10/02/24  Shoulder flexion 150 65* active, 120 passive 126 deg 81 deg  Shoulder abduction 165 60* active, 100 passive 153 deg 79 deg  Shoulder internal rotation 60 53    Shoulder external rotation 75 50     MMT BLE 09/27/24:  -Ankle DF: 5/5 bilat -Ankle eversion: 4+/5 bilat c soreness on Rt (lateral ankle edema bilat)  -Ankle inversion: 5/5 bilat  -Ankle PF: 30% ROM rise single LLE; 0% ROM rise on RLE; interpretation: severe strength impairment. -Hip ER: 5/5 bilat  -Hip IR: 5/5 bilat   BALANCE SCREENING 09/27/24: -60sec narrow stance firm surface: low sway  -60sec narrow stance foam: heavy sway, no LOB  -normal stance on incline eyes closed: ~10 sec with posterior sway and LOB  -SLS: >10SEC bilat  TRANSFERS: 07/23/24: 5xSTS: 29.81sec   FUNCTIONAL TESTS:  07/23/24 TUG: 14.51 sec 5xSTS: 29.81 sec : 11.69sec : 1180ft  10/02/24:  5xSTS: 16.24 sec : 1189' : 1.04 m/s TUG: 9.99 sec  PATIENT SURVEYS:  05/16/24: Quick DASH: 88.6% 07/23/24: Abc scale 24.38% 10/02/24: ABC Scale: 61.25%                                                                                                                             TREATMENT DATE 11/13/24:      TA- To improve functional movements patterns for everyday tasks  STS hands crossed  2x10.  Ambulate with 3 lb. AW - 300'  6 step crane step up/down 15x each LE with SUE support    NMR: To facilitate reeducation of movement, balance, posture, coordination, and/or proprioception/kinesthetic sense. Standing with CGA next to support surface:  Airex pad: static stand 30 seconds x 2  trials, noticeable trembling of ankles/LE's with fatigue and challenge to maintain stability Airex pad: horizontal head turns 30 seconds scanning room 10x ; cueing for arc of motion  Airex pad: vertical head turns 30 seconds, cueing for arc of motion, noticeable sway with upward gaze increasing demand on ankle righting reaction musculature Airex pad: one foot on 6 step one foot on airex pad, hold position for 30 seconds, switch legs, 2x each LE; Airex pad with visual scan word game        PATIENT EDUCATION: Education details: Relevance of sway variations in testing, specificity to incline imbalance to neuropathy diagnosis.  Person educated: Product/process Development Scientist method: Discussion  Education comprehension: Good   HOME EXERCISE PROGRAM: Access Code: [email or web address] URL: [email or web address] Date: 05/22/2024 Prepared by: Lonni Gainer   Exercises - Seated Shoulder Flexion Towel Slide at Table Top  - 2 x daily - 7 x weekly - 10 reps - 5 sec hold - Seated Shoulder Abduction Towel Slide at Table Top  - 2 x daily - 7 x weekly - 10 reps - 5 sec hold - Supine Isometric Shoulder Extension with  Towel  - 2 x daily - 7 x weekly - 2 sets - 10 reps  Access Code: [email or web address] URL: [email or web address] Date: 09/26/2024 Prepared by: Peggye Linear  Exercises - Heel Raises with Counter Support  - 1 x daily - 7 x weekly - 3 sets - 15 reps - Standing March with Counter Support  - 1 x daily - 7 x weekly - 3 sets - 10 reps  GOALS: Goals reviewed with patient? Yes  SHORT TERM GOALS: Target date: 11/18/24  Patient will decrease Quick DASH score to less than 25% demonstrating reduced self-reported upper extremity disability. Baseline: 88.6 %; 07/23/24: 59.1% Goal status: NEW   2.  Patient will demonstrate L shoulder AROM in flexion and ABD > 90 degrees for improved ability to perform overhead activities of ADL and home care. Baseline: flexion- 65 active, abduction- 60 active;  07/02/24: Flexion: 54 deg; Abduction: 48 deg Goal status: NEW  3.  Pt to report independence and compliance with basic HEP for Left shoulder strength, pain management, and balance performance.  Baseline: HEP in place, compliance to follow Goal status: NEW  4.  Pt to demonstrate 5xSTS <18sec and >1274ft to improve baseline cardiorespiratory function and LE power closer to population norm values in order to maximize reaction time and strength for improved LOB recovery and cognitive wellness.  Baseline: 5xSTS: 29.81 sec, : 1172ft Goal status: NEW  5.  Pt to improve balance confidence AEB >50% improvement on ABC Scale. Baseline: 24.38%  Goal status: NEW  LONG TERM GOALS: Target date: 12/20/23  Pt to demonstrate 5/5 strength in left shoulder extension, ABDCT, ER, IR, and 4/5 left shoulder flexion <3/10 pain.  Baseline:  Goal status: INITIAL  2.  Patient will demonstrate L shoulder AROM in flexion and ABD > 125 degrees for improved ability to perform overhead activities of ADL and home care. Baseline: flexion- 65 active, abduction- 60 active; 07/02/24: Flexion: 54 deg; Abduction: 48 deg Goal status: NEW  3.  Pt to report independence and compliance with advanced, gym or home based exercise regimen for BUE strength, BLE strength, and cardio fitness for continued progress prior to and post discharge.  Baseline: HEP in place, compliance to follow Goal status: NEW Baseline:  Goal status: INITIAL  4.  Pt to demonstrate 5xSTS <12sec and >1590ft to improve baseline cardiorespiratory function and LE power closer to population norm values in order to maximize reaction time and strength for improved LOB recovery and cognitive wellness.  Baseline: 5xSTS: 29.81 sec, : 1157ft Goal status: NEW  5.  Pt to demonstrate ability to perform eyes closed narrow stance balance on foam surface at modI level 3x60sec and eyes closed firm surface inclined stance 3x30sec at modI level.  Baseline:  minguard to minA and <10sec respectively;  Goal status: INITIAL  ASSESSMENT:  CLINICAL IMPRESSION: The patient is a female who is working on improving her balance and demonstrated good engagement in both functional strengthening and neuromuscular reeducation tasks today. She tolerated sit to stand training with arms crossed and ambulated with ankle weights for three hundred feet, showing improved endurance and stability. Step training on a six inch step further challenged her lower extremity strength and coordination. During neuromuscular reeducation, the patient performed multiple balance tasks on an Airex pad including static standing, head turns, and staggered stance positions. She displayed increased ankle and lower extremity trembling with fatigue and occasional sway, especially with upward gaze, indicating ongoing deficits in balance strategies and proprioceptive control. Continued physical therapy is  beneficial to improve balance reactions, lower extremity stability, and overall functional confidence during daily activities. Ongoing skilled intervention will help refine postural control, reduce risk of falls, and support safe progression toward more advanced balance and mobility tasks.  OBJECTIVE IMPAIRMENTS: Decreased knowledge of condition, decreased use of DME, decreased mobility, difficulty walking, decreased strength, decreased ROM. ACTIVITY LIMITATIONS: Lifting, standing, walking, squatting, transfers, locomotion level PARTICIPATION LIMITATIONS: Cleaning, laundry, interpersonal relationships, driving, yardwork, community activity.  PERSONAL FACTORS: Age, behavior pattern, education, past/current experiences, transportation, profession  are also affecting patient's functional outcome.  REHAB POTENTIAL: Good CLINICAL DECISION MAKING: Medium  EVALUATION COMPLEXITY: Moderate    PLAN:  PT FREQUENCY: 1-2x/week   PT DURATION: 12 weeks   PLANNED INTERVENTIONS: 97164- PT Re-evaluation,  97750- Physical Performance Testing, 97110-Therapeutic exercises, 97530- Therapeutic activity, 97112- Neuromuscular re-education, 97535- Self Care, [id number]- Manual therapy, [phone]- Gait training, [phone]- Electrical stimulation (unattended), [phone]- Electrical stimulation (manual), Patient/Family education, Balance training, Stair training, Joint mobilization, Joint manipulation, Cryotherapy, and Moist heat  PLAN FOR NEXT SESSION:  Continue with balance training Continue with UE exercises to improve overall ROM and mobility, along with strengthening FU on New HEP items from last visit   Fotini Lemus  Leopoldo, PT, DPT Physical Therapist - [facility]  Outpatient Physical Therapy- Main Campus [phone]      1:59 PM, 11/13/24    "

## 2024-11-13 ENCOUNTER — Ambulatory Visit

## 2024-11-13 DIAGNOSIS — M6281 Muscle weakness (generalized): Secondary | ICD-10-CM

## 2024-11-13 DIAGNOSIS — G8929 Other chronic pain: Secondary | ICD-10-CM

## 2024-11-13 DIAGNOSIS — R2681 Unsteadiness on feet: Secondary | ICD-10-CM | POA: Diagnosis not present

## 2024-11-13 DIAGNOSIS — R262 Difficulty in walking, not elsewhere classified: Secondary | ICD-10-CM

## 2024-11-13 DIAGNOSIS — R2689 Other abnormalities of gait and mobility: Secondary | ICD-10-CM

## 2024-11-18 ENCOUNTER — Encounter: Admitting: Dermatology

## 2024-11-18 ENCOUNTER — Ambulatory Visit: Admitting: Physical Therapy

## 2024-11-20 ENCOUNTER — Ambulatory Visit

## 2024-11-20 DIAGNOSIS — R2681 Unsteadiness on feet: Secondary | ICD-10-CM | POA: Diagnosis not present

## 2024-11-20 DIAGNOSIS — M6281 Muscle weakness (generalized): Secondary | ICD-10-CM

## 2024-11-20 NOTE — Therapy (Signed)
 " OUTPATIENT PHYSICAL THERAPY TREATMENT  Patient Name: Roberta Lane MRN: [id number] DOB:1956/03/18, 69 y.o., female Today's Date: 11/20/2024  PCP: Roberta Lynwood FALCON, MD  REFERRING PROVIDER: Valora Lynwood FALCON, MD   END OF SESSION:  PT End of Session - 11/20/24 1400     Visit Number 8    Number of Visits 24    Date for Recertification  12/19/24    Authorization Type Medicare/ BCBS secondary    Authorization Time Period 09/26/24-12/19/24    Progress Note Due on Visit 10    PT Start Time 1316    PT Stop Time 1358    PT Time Calculation (min) 42 min    Equipment Utilized During Treatment Gait belt    Activity Tolerance Patient tolerated treatment well;No increased pain    Behavior During Therapy WFL for tasks assessed/performed               Past Medical History:  Diagnosis Date   Abdominal hernia    Allergic genetic state    Aortic ejection murmur    Ascites    Chronic hepatitis C (HCC)    Depression    Diabetes mellitus without complication (HCC)    Dysrhythmia    GERD (gastroesophageal reflux disease)    GIB (gastrointestinal bleeding)    Headache    Hepatic cirrhosis (HCC)    Hepatitis C    Hernia, abdominal    Hyperlipidemia    Hypertension    IDA (iron  deficiency anemia)    Inflamed seborrheic keratosis    Interdigital neuroma of foot    Irregular heart rhythm    Liver disease    Multiple renal cysts    Palpitations    Pancytopenia (HCC)    Portal hypertension with esophageal varices (HCC)    Splenomegaly    UTI (urinary tract infection)    Past Surgical History:  Procedure Laterality Date   ABDOMINAL HYSTERECTOMY     CHOLECYSTECTOMY     COLONOSCOPY WITH PROPOFOL  N/A 12/05/2023   Procedure: COLONOSCOPY WITH PROPOFOL ;  Surgeon: Toledo, Roberta POUR, MD;  Location: [facility];  Service: Gastroenterology;  Laterality: N/A;  DM   ESOPHAGOGASTRODUODENOSCOPY N/A 02/17/2021   Procedure: ESOPHAGOGASTRODUODENOSCOPY (EGD);  Surgeon: Toledo, Roberta POUR, MD;   Location: [facility];  Service: Gastroenterology;  Laterality: N/A;   ESOPHAGOGASTRODUODENOSCOPY (EGD) WITH PROPOFOL  N/A 11/23/2017   Procedure: ESOPHAGOGASTRODUODENOSCOPY (EGD) WITH PROPOFOL ;  Surgeon: Toledo, Roberta POUR, MD;  Location: [facility];  Service: Gastroenterology;  Laterality: N/A;   ESOPHAGOGASTRODUODENOSCOPY (EGD) WITH PROPOFOL  N/A 10/23/2018   Procedure: ESOPHAGOGASTRODUODENOSCOPY (EGD) WITH PROPOFOL ;  Surgeon: Toledo, Roberta POUR, MD;  Location: [facility];  Service: Gastroenterology;  Laterality: N/A;   ESOPHAGOGASTRODUODENOSCOPY (EGD) WITH PROPOFOL  N/A 10/10/2019   Procedure: ESOPHAGOGASTRODUODENOSCOPY (EGD) WITH PROPOFOL ;  Surgeon: Toledo, Roberta POUR, MD;  Location: [facility];  Service: Gastroenterology;  Laterality: N/A;   ESOPHAGOGASTRODUODENOSCOPY (EGD) WITH PROPOFOL  N/A 11/14/2019   Procedure: ESOPHAGOGASTRODUODENOSCOPY (EGD) WITH PROPOFOL ;  Surgeon: Toledo, Roberta POUR, MD;  Location: [facility];  Service: Gastroenterology;  Laterality: N/A;   ESOPHAGOGASTRODUODENOSCOPY (EGD) WITH PROPOFOL  N/A 12/05/2023   Procedure: ESOPHAGOGASTRODUODENOSCOPY (EGD) WITH PROPOFOL ;  Surgeon: Toledo, Roberta POUR, MD;  Location: [facility];  Service: Gastroenterology;  Laterality: N/A;   right elbow surgery     WISDOM TOOTH EXTRACTION     Patient Active Problem List   Diagnosis Date Noted   Renal mass, left 04/11/2020   Hypertension 03/18/2020   Hepatic cirrhosis due to chronic hepatitis C infection (HCC) 11/28/2019   Chronic pain of  both knees 03/26/2019   Other ascites 03/26/2019   Aortic stenosis, mild 01/07/2019   Iron  deficiency anemia 12/31/2018   Hepatic encephalopathy (HCC) 07/20/2018   Frequent PVCs 06/13/2018   Portal hypertension with esophageal varices (HCC) 06/12/2018   Aortic ejection murmur 05/29/2018   Dizziness 05/29/2018   Hyperlipidemia, mixed 05/29/2018   Palpitations 05/29/2018   Controlled type 2 diabetes mellitus without complication, without  long-term current use of insulin  (HCC) 05/17/2018   Abdominal pain, diffuse 12/14/2017   Gastroesophageal reflux disease without esophagitis 12/14/2017   Pancytopenia (HCC) 12/10/2017   GIB (gastrointestinal bleeding) 11/21/2017    ONSET DATE: Left shoulder injury September 2025; Imbalance Mid 2024 (gradual progressive onset)    REFERRING DIAG: Left shoulder supraspinatus tear; neuropathy imbalance   THERAPY DIAG:  Unsteadiness on feet  Muscle weakness (generalized)  Rationale for Evaluation and Treatment: Rehabilitation   SUBJECTIVE:                                                                                                                                                                                             SUBJECTIVE STATEMENT: Pt reports she's been working on her exercises, shoulder has been bothering her a little bit. Pt reports no stumbles/falls since last visit. No other updates/concerns.  PERTINENT HISTORY:   Roberta Lane is a 68yoF who is referred to OPPT for 2 new problems: pt referred from orthopedics Roberta Lane regarding a fall in Sept 2024 c subsequent shoulder pain, MRI findings later revealing supraspinatus partial thickness tear. The second referral for imbalance, gait instability comes from PCP Dr. Valora after new onset falls x3-4 since Sept 2024- pt referred to [facility]ta Lane neurology late 2025 where she is found to have BLE neuropathy from knees down to toes. At baseline pt is fully independent, AMB freely in the community, used to walk her late dog regularly (traumatic passing in Oct 2025).   PROBLEM 1: Shoulder pain/supraspinatus tear Pt sustained a fall in Sept 2024 while stepping onto a bus landing on left shoulder, persistent pain in Left shoulder/forearm, thumb, ultimately seen first for this by PCP, then by orthopedics Dr. Kathlynn 04/17/24, xray showing slight A/C joint separation and exam suggestive of rotator cuff injury, subsequent MRI 04/21/24  revealing of supraspinatus tendinopathy/partial tear, moderate biceps tendinopathy. Ortho recommended 4 weeks of PT at that time. Pt started OPPT here on 05/16/24, initial Quickdash 88.6%. Second fall on 06/29/24 landing on left shoulder again. PT recommended ortho FU and pt took 3 weeks off from PT, returning on 07/23/24 after orthopedic clearance and reassuring imaging/corticosteroid Rx. Pt felts that she was making progress with shoulder until  she fell, and desired to regain these improvements. 2nd week November 2025, pt's dog was attacked/killed by a pit bull and pt hurt her Left shoulder again while trying to separate the dogs.    PROBLEM 2: History of falls/neuropathy  First fall in September 2024, then second fall on 07/02/24 landing on left shoulder again, FU with PCP on 9/18 asking for PT referral for balance issues >1 year. Pt told PCP and hepatology about occasional dizziness, particularly if standing to quickly, however no falls occurring during these episodes. PCP also recommending neurology consult. Pt returned to PT 07/23/24 with new consult from PCP, TUG: 14.51sec, 29.81sec, : 11.69sec, : 1176ft, ABC scale: 24.38%. She experiences occasional imbalance, tripping over her feet, and has a fear of escalators. She sometimes feels dizzy or lightheaded, particularly when standing up quickly. She has a history of diabetes, with a well-controlled A1c of 5.4 as of last A1C 05/2023.  PAIN:  Are you having pain? 2/10 at rest, left lateral shoulder (worse with A/ROM or functional use); In past week, highest pain: 8/10 (09/26/24)    PRECAUTIONS: None  WEIGHT BEARING RESTRICTIONS: No  FALLS HISTORY:  2 falls since Sept 2024; once while stepping onto an airport shuttle bus, fell backwards and landed on Left shoudler; 2nd when stepping down a curb unaware of its presence and falling onto left shoulder.   LIVING ENVIRONMENT: Lives with: lives alone, neighbors able to stop by if she needs them Lives  in: House/apartment Stairs: Yes: Internal: 14 steps; railing on left going up Has following equipment at home: Shower bench   OCCUPATION: Retired- spends her days watching tv   PLOF: Independent  PATIENT GOALS:   1. Shoulder: have less pain in general, be able to freely use BUE for tasks like opening jars   2. Balance: avoid any additional falls, be more confident with curbs, steps  OBJECTIVE:  Note: Objective measures were completed at Evaluation unless otherwise noted.  DIAGNOSTIC FINDINGS:  07/12/24:  Note from Roberta Lane, [facility]: MRI of the left shoulder was reviewed, showing a high-grade partial tear of the supraspinatus tendon, measuring 3 to 6 millimeters. Independent interpretation performed by me confirms the partial tear without full detachment.(...)  I recommended non-operative management at this time. A prednisone taper was prescribed to manage inflammation and pain, with instructions to monitor blood sugar levels due to her diabetes. If symptoms do not improve, a steroid injection may be considered. The patient was encouraged to return to therapy and was provided with a new referral.  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 Referral to PT for gait and balance.  COGNITION: Overall cognitive status: WNL  VITAL SIGNS:  09/26/24:  -132/35mmHg 69bpm seated  -132/25mmHg 74bpm standing (no dizziness symptoms)    EDEMA:  Ankle edema bilat, focal to the ATFL area, pt reports as chronic.   ROM ASSESSMENT: *LE not assessed at evaluation, however pt remarks on weakness/pain with seated Right hip flexion needed for car transfers.   -pt demonstrates standing: hip flexion: fatigue/pain on right 1x10 bilat c chair support shows impaired excursion of height on  both sides and progressive loss of height over 8 reps on right side.  *warrants closer assessment of Rt hip to rule out joint disease, neurological weakness, etc. Also reports intermittent knee giving way.   -  Rt knee with mild degenerative valgus, however pt denies typical pain issues.   UPPER EXTREMITY ROM:     Active ROM Right 05/16/24 Left 05/16/24 Right 10/02/24 Left 10/02/24  Shoulder flexion 150 65* active, 120 passive 126 deg 81 deg  Shoulder abduction 165 60* active, 100 passive 153 deg 79 deg  Shoulder internal rotation 60 53    Shoulder external rotation 75 50     MMT BLE 09/27/24:  -Ankle DF: 5/5 bilat -Ankle eversion: 4+/5 bilat c soreness on Rt (lateral ankle edema bilat)  -Ankle inversion: 5/5 bilat  -Ankle PF: 30% ROM rise single LLE; 0% ROM rise on RLE; interpretation: severe strength impairment. -Hip ER: 5/5 bilat  -Hip IR: 5/5 bilat   BALANCE SCREENING 09/27/24: -60sec narrow stance firm surface: low sway  -60sec narrow stance foam: heavy sway, no LOB  -normal stance on incline eyes closed: ~10 sec with posterior sway and LOB  -SLS: >10SEC bilat  TRANSFERS: 07/23/24: 5xSTS: 29.81sec   FUNCTIONAL TESTS:  07/23/24 TUG: 14.51 sec 5xSTS: 29.81 sec : 11.69sec : 1137ft  10/02/24:  5xSTS: 16.24 sec : 1189' : 1.04 m/s TUG: 9.99 sec  PATIENT SURVEYS:  05/16/24: Quick DASH: 88.6% 07/23/24: Abc scale 24.38% 10/02/24: ABC Scale: 61.25%                                                                                                                             TREATMENT DATE 11/20/24:     NMR: To facilitate reeducation of movement, balance, posture, coordination, and/or proprioception/kinesthetic sense.  Gait belt donned throughout -   One foot on airex, one on 6 step 2x30 sec each LE --then one round each LE with dual cog task  Airex  step over 15x each LE - LE fatigue with exercise  Cone taps with three cones - x multiple reps each LE -  rates medium +, challenging  Lateral step on and off airex and 6 step with UUE support x multiple reps  FWD/BCKWD and LTL steps over orange hurdle x multiple reps. LTL stepping most difficult, corrects with multiple small steps   Standing on airex NBOS x30 sec -horizontal head turns EO x 2 rounds  -vertical head turns EO x 2 rounds Then repeated above two exercises EC WBOS x 2 rounds  NBOS EO and EC 30 sec of each     PATIENT EDUCATION: Education details: exercises technique/purpose Person educated: Product/process Development Scientist method: Discussion  Education comprehension: Good   HOME EXERCISE PROGRAM: Access Code: [email or web address] URL: [email or web address] Date: 05/22/2024 Prepared by: Lonni Gainer   Exercises - Seated Shoulder Flexion Towel Slide at Table Top  - 2 x daily - 7 x weekly - 10 reps - 5 sec hold - Seated Shoulder Abduction Towel Slide at Table Top  - 2 x daily - 7 x weekly - 10 reps - 5 sec hold - Supine Isometric Shoulder Extension with Towel  - 2 x daily - 7 x  weekly - 2 sets - 10 reps  Access Code: [email or web address] URL: [email or web address] Date: 09/26/2024 Prepared by: Peggye Linear  Exercises - Heel Raises with Counter Support  - 1 x daily - 7 x weekly - 3 sets - 15 reps - Standing March with Counter Support  - 1 x daily - 7 x weekly - 3 sets - 10 reps  GOALS: Goals reviewed with patient? Yes  SHORT TERM GOALS: Target date: 11/18/24  Patient will decrease Quick DASH score to less than 25% demonstrating reduced self-reported upper extremity disability. Baseline: 88.6 %; 07/23/24: 59.1% Goal status: NEW   2.  Patient will demonstrate L shoulder AROM in flexion and ABD > 90 degrees for improved ability to perform overhead activities of ADL and home care. Baseline: flexion- 65 active, abduction- 60 active; 07/02/24: Flexion: 54 deg; Abduction: 48 deg Goal status: NEW  3.  Pt to report independence and compliance with basic HEP for Left  shoulder strength, pain management, and balance performance.  Baseline: HEP in place, compliance to follow Goal status: NEW  4.  Pt to demonstrate 5xSTS <18sec and >1225ft to improve baseline cardiorespiratory function and LE power closer to population norm values in order to maximize reaction time and strength for improved LOB recovery and cognitive wellness.  Baseline: 5xSTS: 29.81 sec, : 1149ft Goal status: NEW  5.  Pt to improve balance confidence AEB >50% improvement on ABC Scale. Baseline: 24.38%  Goal status: NEW  LONG TERM GOALS: Target date: 12/20/23  Pt to demonstrate 5/5 strength in left shoulder extension, ABDCT, ER, IR, and 4/5 left shoulder flexion <3/10 pain.  Baseline:  Goal status: INITIAL  2.  Patient will demonstrate L shoulder AROM in flexion and ABD > 125 degrees for improved ability to perform overhead activities of ADL and home care. Baseline: flexion- 65 active, abduction- 60 active; 07/02/24: Flexion: 54 deg; Abduction: 48 deg Goal status: NEW  3.  Pt to report independence and compliance with advanced, gym or home based exercise regimen for BUE strength, BLE strength, and cardio fitness for continued progress prior to and post discharge.  Baseline: HEP in place, compliance to follow Goal status: NEW Baseline:  Goal status: INITIAL  4.  Pt to demonstrate 5xSTS <12sec and >1512ft to improve baseline cardiorespiratory function and LE power closer to population norm values in order to maximize reaction time and strength for improved LOB recovery and cognitive wellness.  Baseline: 5xSTS: 29.81 sec, : 1143ft Goal status: NEW  5.  Pt to demonstrate ability to perform eyes closed narrow stance balance on foam surface at modI level 3x60sec and eyes closed firm surface inclined stance 3x30sec at modI level.  Baseline: minguard to minA and <10sec respectively;  Goal status: INITIAL  ASSESSMENT:  CLINICAL IMPRESSION: Author continued plan/focus on  balance activities. Pt noted to be challenged by majority of SL-based balance tasks. While pt struggled with these activities, she demonstrated within-session improvement in EC on compliant surface, exhibiting decreased sway with reps.  Continued physical therapy is beneficial to improve balance reactions, lower extremity stability, and overall functional confidence during daily activities. Ongoing skilled intervention will help refine postural control, reduce risk of falls, and support safe progression toward more advanced balance and mobility tasks.  OBJECTIVE IMPAIRMENTS: Decreased knowledge of condition, decreased use of DME, decreased mobility, difficulty walking, decreased strength, decreased ROM. ACTIVITY LIMITATIONS: Lifting, standing, walking, squatting, transfers, locomotion level PARTICIPATION LIMITATIONS: Cleaning, laundry, interpersonal relationships, driving, yardwork, community activity.  PERSONAL FACTORS: Age, behavior pattern,  education, past/current experiences, transportation, profession  are also affecting patient's functional outcome.  REHAB POTENTIAL: Good CLINICAL DECISION MAKING: Medium  EVALUATION COMPLEXITY: Moderate    PLAN:  PT FREQUENCY: 1-2x/week   PT DURATION: 12 weeks   PLANNED INTERVENTIONS: 97164- PT Re-evaluation, 97750- Physical Performance Testing, 97110-Therapeutic exercises, 97530- Therapeutic activity, 97112- Neuromuscular re-education, 97535- Self Care, 02859- Manual therapy, [id number]- Gait training, [phone]- Electrical stimulation (unattended), [phone]- Electrical stimulation (manual), Patient/Family education, Balance training, Stair training, Joint mobilization, Joint manipulation, Cryotherapy, and Moist heat  PLAN FOR NEXT SESSION:  Continue with balance training Continue with UE exercises to improve overall ROM and mobility, along with strengthening FU on New HEP items from last visit  Continue plan  Darryle Patten PT, DPT  Physical Therapist - [facility]  [facility]  Outpatient Physical Therapy- Main [facility] [phone]      2:05 PM, 11/20/24    "

## 2024-11-25 ENCOUNTER — Ambulatory Visit

## 2024-11-27 ENCOUNTER — Ambulatory Visit

## 2024-11-27 DIAGNOSIS — R2689 Other abnormalities of gait and mobility: Secondary | ICD-10-CM

## 2024-11-27 DIAGNOSIS — R2681 Unsteadiness on feet: Secondary | ICD-10-CM

## 2024-11-27 DIAGNOSIS — R262 Difficulty in walking, not elsewhere classified: Secondary | ICD-10-CM

## 2024-11-27 DIAGNOSIS — M6281 Muscle weakness (generalized): Secondary | ICD-10-CM

## 2024-11-27 NOTE — Therapy (Signed)
 " OUTPATIENT PHYSICAL THERAPY TREATMENT  Patient Name: Roberta Lane MRN: [id number] DOB:03-01-1956, 69 y.o., female Today's Date: 11/27/2024  PCP: Valora Lynwood FALCON, MD  REFERRING PROVIDER: Valora Lynwood FALCON, MD   END OF SESSION:  PT End of Session - 11/27/24 1335     Visit Number 9    Number of Visits 24    Date for Recertification  12/19/24    Authorization Type Medicare/ BCBS secondary    Authorization Time Period 09/26/24-12/19/24    Progress Note Due on Visit 10    PT Start Time 1320    PT Stop Time 1400    PT Time Calculation (min) 40 min    Equipment Utilized During Treatment Gait belt    Activity Tolerance Patient tolerated treatment well;No increased pain    Behavior During Therapy WFL for tasks assessed/performed            Past Medical History:  Diagnosis Date   Abdominal hernia    Allergic genetic state    Aortic ejection murmur    Ascites    Chronic hepatitis C (HCC)    Depression    Diabetes mellitus without complication (HCC)    Dysrhythmia    GERD (gastroesophageal reflux disease)    GIB (gastrointestinal bleeding)    Headache    Hepatic cirrhosis (HCC)    Hepatitis C    Hernia, abdominal    Hyperlipidemia    Hypertension    IDA (iron  deficiency anemia)    Inflamed seborrheic keratosis    Interdigital neuroma of foot    Irregular heart rhythm    Liver disease    Multiple renal cysts    Palpitations    Pancytopenia (HCC)    Portal hypertension with esophageal varices (HCC)    Splenomegaly    UTI (urinary tract infection)    Past Surgical History:  Procedure Laterality Date   ABDOMINAL HYSTERECTOMY     CHOLECYSTECTOMY     COLONOSCOPY WITH PROPOFOL  N/A 12/05/2023   Procedure: COLONOSCOPY WITH PROPOFOL ;  Surgeon: Toledo, Ladell POUR, MD;  Location: [facility];  Service: Gastroenterology;  Laterality: N/A;  DM   ESOPHAGOGASTRODUODENOSCOPY N/A 02/17/2021   Procedure: ESOPHAGOGASTRODUODENOSCOPY (EGD);  Surgeon: Toledo, Ladell POUR, MD;   Location: [facility];  Service: Gastroenterology;  Laterality: N/A;   ESOPHAGOGASTRODUODENOSCOPY (EGD) WITH PROPOFOL  N/A 11/23/2017   Procedure: ESOPHAGOGASTRODUODENOSCOPY (EGD) WITH PROPOFOL ;  Surgeon: Toledo, Ladell POUR, MD;  Location: [facility];  Service: Gastroenterology;  Laterality: N/A;   ESOPHAGOGASTRODUODENOSCOPY (EGD) WITH PROPOFOL  N/A 10/23/2018   Procedure: ESOPHAGOGASTRODUODENOSCOPY (EGD) WITH PROPOFOL ;  Surgeon: Toledo, Ladell POUR, MD;  Location: [facility];  Service: Gastroenterology;  Laterality: N/A;   ESOPHAGOGASTRODUODENOSCOPY (EGD) WITH PROPOFOL  N/A 10/10/2019   Procedure: ESOPHAGOGASTRODUODENOSCOPY (EGD) WITH PROPOFOL ;  Surgeon: Toledo, Ladell POUR, MD;  Location: [facility];  Service: Gastroenterology;  Laterality: N/A;   ESOPHAGOGASTRODUODENOSCOPY (EGD) WITH PROPOFOL  N/A 11/14/2019   Procedure: ESOPHAGOGASTRODUODENOSCOPY (EGD) WITH PROPOFOL ;  Surgeon: Toledo, Ladell POUR, MD;  Location: [facility];  Service: Gastroenterology;  Laterality: N/A;   ESOPHAGOGASTRODUODENOSCOPY (EGD) WITH PROPOFOL  N/A 12/05/2023   Procedure: ESOPHAGOGASTRODUODENOSCOPY (EGD) WITH PROPOFOL ;  Surgeon: Toledo, Ladell POUR, MD;  Location: [facility];  Service: Gastroenterology;  Laterality: N/A;   right elbow surgery     WISDOM TOOTH EXTRACTION     Patient Active Problem List   Diagnosis Date Noted   Renal mass, left 04/11/2020   Hypertension 03/18/2020   Hepatic cirrhosis due to chronic hepatitis C infection (HCC) 11/28/2019   Chronic pain of both knees 03/26/2019  Other ascites 03/26/2019   Aortic stenosis, mild 01/07/2019   Iron  deficiency anemia 12/31/2018   Hepatic encephalopathy (HCC) 07/20/2018   Frequent PVCs 06/13/2018   Portal hypertension with esophageal varices (HCC) 06/12/2018   Aortic ejection murmur 05/29/2018   Dizziness 05/29/2018   Hyperlipidemia, mixed 05/29/2018   Palpitations 05/29/2018   Controlled type 2 diabetes mellitus without complication, without  long-term current use of insulin  (HCC) 05/17/2018   Abdominal pain, diffuse 12/14/2017   Gastroesophageal reflux disease without esophagitis 12/14/2017   Pancytopenia (HCC) 12/10/2017   GIB (gastrointestinal bleeding) 11/21/2017    ONSET DATE: Left shoulder injury September 2025; Imbalance Mid 2024 (gradual progressive onset)    REFERRING DIAG: Left shoulder supraspinatus tear; neuropathy imbalance   THERAPY DIAG:  Unsteadiness on feet  Muscle weakness (generalized)  Difficulty in walking, not elsewhere classified  Imbalance  Rationale for Evaluation and Treatment: Rehabilitation   SUBJECTIVE:                                                                                                                                                                                             SUBJECTIVE STATEMENT: Pt doing well in general. Still very motivated to improved.   PERTINENT HISTORY:   Roberta Lane is a 68yoF who is referred to OPPT for 2 new problems: pt referred from orthopedics Dr. Marchia regarding a fall in Sept 2024 c subsequent shoulder pain, MRI findings later revealing supraspinatus partial thickness tear. The second referral for imbalance, gait instability comes from PCP Dr. Valora after new onsfet falls x3-4 since Sept 2024- pt referred to Dr. Lane neurology late 2025 where she is found to have BLE neuropathy from knees down to toes. At baseline pt is fully independent, AMB freely in the community, used to walk her late dog regularly (traumatic passing in Oct 2025).   PROBLEM 1: Shoulder pain/supraspinatus tear Pt sustained a fall in Sept 2024 while stepping onto a bus landing on left shoulder, persistent pain in Left shoulder/forearm, thumb, ultimately seen first for this by PCP, then by orthopedics Dr. Kathlynn 04/17/24, xray showing slight A/C joint separation and exam suggestive of rotator cuff injury, subsequent MRI 04/21/24 revealing of supraspinatus  tendinopathy/partial tear, moderate biceps tendinopathy. Ortho recommended 4 weeks of PT at that time. Pt started OPPT here on 05/16/24, initial Quickdash 88.6%. Second fall on 06/29/24 landing on left shoulder again. PT recommended ortho FU and pt took 3 weeks off from PT, returning on 07/23/24 after orthopedic clearance and reassuring imaging/corticosteroid Rx. Pt felts that she was making progress with shoulder until she fell, and desired to regain these improvements. 2nd week November  2025, pt's dog was attacked/killed by a pit bull and pt hurt her Left shoulder again while trying to separate the dogs.    PROBLEM 2: History of falls/neuropathy  First fall in September 2024, then second fall on 07/02/24 landing on left shoulder again, FU with PCP on 9/18 asking for PT referral for balance issues >1 year. Pt told PCP and hepatology about occasional dizziness, particularly if standing to quickly, however no falls occurring during these episodes. PCP also recommending neurology consult. Pt returned to PT 07/23/24 with new consult from PCP, TUG: 14.51sec, 29.81sec, : 11.69sec, : 1167ft, ABC scale: 24.38%. She experiences occasional imbalance, tripping over her feet, and has a fear of escalators. She sometimes feels dizzy or lightheaded, particularly when standing up quickly. She has a history of diabetes, with a well-controlled A1c of 5.4 as of last A1C 05/2023.  PAIN:  Are you having pain? 4/10 at rest, left lateral shoulder (worse with A/ROM or functional use); In past week, highest pain: 8/10 (09/26/24)    PRECAUTIONS: None  WEIGHT BEARING RESTRICTIONS: No  FALLS HISTORY:  2 falls since Sept 2024; once while stepping onto an airport shuttle bus, fell backwards and landed on Left shoudler; 2nd when stepping down a curb unaware of its presence and falling onto left shoulder.   LIVING ENVIRONMENT: Lives with: lives alone, neighbors able to stop by if she needs them Lives in: House/apartment Stairs:  Yes: Internal: 14 steps; railing on left going up Has following equipment at home: Shower bench   OCCUPATION: Retired- spends her days watching tv   PLOF: Independent  PATIENT GOALS:   1. Shoulder: have less pain in general, be able to freely use BUE for tasks like opening jars   2. Balance: avoid any additional falls, be more confident with curbs, steps  OBJECTIVE:  Note: Objective measures were completed at Evaluation unless otherwise noted.  DIAGNOSTIC FINDINGS:  07/12/24:  Note from Dr. Marchia, [facility]: MRI of the left shoulder was reviewed, showing a high-grade partial tear of the supraspinatus tendon, measuring 3 to 6 millimeters. Independent interpretation performed by me confirms the partial tear without full detachment.(...)  I recommended non-operative management at this time. A prednisone taper was prescribed to manage inflammation and pain, with instructions to monitor blood sugar levels due to her diabetes. If symptoms do not improve, a steroid injection may be considered. The patient was encouraged to return to therapy and was provided with a new referral.  08/26/24 Note from Dr. 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 Referral to PT for gait and balance.  COGNITION: Overall cognitive status: WNL  VITAL SIGNS:  09/26/24:  -132/61mmHg 69bpm seated  -132/87mmHg 74bpm standing (no dizziness symptoms)    EDEMA:  Ankle edema bilat, focal to the ATFL area, pt reports as chronic.   ROM ASSESSMENT: *LE not assessed at evaluation, however pt remarks on weakness/pain with seated Right hip flexion needed for car transfers.   -pt demonstrates standing: hip flexion: fatigue/pain on right 1x10 bilat c chair support shows impaired excursion of height on both sides and progressive  loss of height over 8 reps on right side.  *warrants closer assessment of Rt hip to rule out joint disease, neurological weakness, etc. Also reports intermittent knee giving way.   -Rt knee with mild degenerative valgus, however pt denies typical pain  issues.   UPPER EXTREMITY ROM:     Active ROM Right 05/16/24 Left 05/16/24 Right 10/02/24 Left 10/02/24  Shoulder flexion 150 65* active, 120 passive 126 deg 81 deg  Shoulder abduction 165 60* active, 100 passive 153 deg 79 deg  Shoulder internal rotation 60 53    Shoulder external rotation 75 50     MMT BLE 09/27/24:  -Ankle DF: 5/5 bilat -Ankle eversion: 4+/5 bilat c soreness on Rt (lateral ankle edema bilat)  -Ankle inversion: 5/5 bilat  -Ankle PF: 30% ROM rise single LLE; 0% ROM rise on RLE; interpretation: severe strength impairment. -Hip ER: 5/5 bilat  -Hip IR: 5/5 bilat   BALANCE SCREENING 09/27/24: -60sec narrow stance firm surface: low sway  -60sec narrow stance foam: heavy sway, no LOB  -normal stance on incline eyes closed: ~10 sec with posterior sway and LOB  -SLS: >10SEC bilat  TRANSFERS: 07/23/24: 5xSTS: 29.81sec   FUNCTIONAL TESTS:  07/23/24 TUG: 14.51 sec 5xSTS: 29.81 sec : 11.69sec : 1143ft  10/02/24:  5xSTS: 16.24 sec : 1189' : 1.04 m/s TUG: 9.99 sec  PATIENT SURVEYS:  05/16/24: Quick DASH: 88.6% 07/23/24: Abc scale 24.38% 10/02/24: ABC Scale: 61.25%                                                                                                                             TREATMENT DATE 11/27/24:    -Normal stance on airex x2 minutes -Narrow stance on airex x2 minutes -Lateral step up/step down 1x25 bilat on airex step (supervision level)  -forward curb step up, step down (6) 30x -standing on airex, alternating hedgehog taps x5 minutes    PATIENT EDUCATION: Education details: exercises technique/purpose Person educated: Animal Nutritionist  Education method: Discussion  Education  comprehension: Good   HOME EXERCISE PROGRAM: Access Code: [email or web address] URL: [email or web address] Date: 05/22/2024 Prepared by: Lonni Gainer   Exercises - Seated Shoulder Flexion Towel Slide at Table Top  - 2 x daily - 7 x weekly - 10 reps - 5 sec hold - Seated Shoulder Abduction Towel Slide at Table Top  - 2 x daily - 7 x weekly - 10 reps - 5 sec hold - Supine Isometric Shoulder Extension with Towel  - 2 x daily - 7 x weekly - 2 sets - 10 reps  Access Code: [email or web address] URL: [email or web address] Date: 09/26/2024 Prepared by: Peggye Linear  Exercises - Heel Raises with Counter Support  - 1 x daily - 7 x weekly - 3 sets - 15 reps - Standing March with Counter Support  - 1 x daily - 7 x weekly - 3 sets - 10 reps  GOALS: Goals reviewed with patient? Yes  SHORT TERM GOALS: Target date: 11/18/24  Patient will decrease Quick DASH score to less than 25% demonstrating reduced self-reported upper extremity disability. Baseline: 88.6 %; 07/23/24: 59.1% Goal status: NEW   2.  Patient will demonstrate L shoulder AROM in flexion and ABD > 90 degrees for improved ability  to perform overhead activities of ADL and home care. Baseline: flexion- 65 active, abduction- 60 active; 07/02/24: Flexion: 54 deg; Abduction: 48 deg Goal status: NEW  3.  Pt to report independence and compliance with basic HEP for Left shoulder strength, pain management, and balance performance.  Baseline: HEP in place, compliance to follow Goal status: NEW  4.  Pt to demonstrate 5xSTS <18sec and >1211ft to improve baseline cardiorespiratory function and LE power closer to population norm values in order to maximize reaction time and strength for improved LOB recovery and cognitive wellness.  Baseline: 5xSTS: 29.81 sec, : 1112ft Goal status: NEW  5.  Pt to improve balance confidence AEB >50% improvement on ABC Scale. Baseline: 24.38%  Goal status: NEW  LONG TERM GOALS: Target date:  12/20/23  Pt to demonstrate 5/5 strength in left shoulder extension, ABDCT, ER, IR, and 4/5 left shoulder flexion <3/10 pain.  Baseline:  Goal status: INITIAL  2.  Patient will demonstrate L shoulder AROM in flexion and ABD > 125 degrees for improved ability to perform overhead activities of ADL and home care. Baseline: flexion- 65 active, abduction- 60 active; 07/02/24: Flexion: 54 deg; Abduction: 48 deg Goal status: NEW  3.  Pt to report independence and compliance with advanced, gym or home based exercise regimen for BUE strength, BLE strength, and cardio fitness for continued progress prior to and post discharge.  Baseline: HEP in place, compliance to follow Goal status: NEW Baseline:  Goal status: INITIAL  4.  Pt to demonstrate 5xSTS <12sec and >1548ft to improve baseline cardiorespiratory function and LE power closer to population norm values in order to maximize reaction time and strength for improved LOB recovery and cognitive wellness.  Baseline: 5xSTS: 29.81 sec, : 1196ft Goal status: NEW  5.  Pt to demonstrate ability to perform eyes closed narrow stance balance on foam surface at modI level 3x60sec and eyes closed firm surface inclined stance 3x30sec at modI level.  Baseline: minguard to minA and <10sec respectively;  Goal status: INITIAL  ASSESSMENT:  CLINICAL IMPRESSION: Author continued plan/focus on balance activities. Pt noted to be challenged by majority of SL-based balance tasks. Pt feel sshe is steadinly making progress. Ongoing skilled intervention will help refine postural control, reduce risk of falls, and support safe progression toward more advanced balance and mobility tasks.  OBJECTIVE IMPAIRMENTS: Decreased knowledge of condition, decreased use of DME, decreased mobility, difficulty walking, decreased strength, decreased ROM. ACTIVITY LIMITATIONS: Lifting, standing, walking, squatting, transfers, locomotion level PARTICIPATION LIMITATIONS: Cleaning,  laundry, interpersonal relationships, driving, yardwork, community activity.  PERSONAL FACTORS: Age, behavior pattern, education, past/current experiences, transportation, profession  are also affecting patient's functional outcome.  REHAB POTENTIAL: Good CLINICAL DECISION MAKING: Medium  EVALUATION COMPLEXITY: Moderate    PLAN:  PT FREQUENCY: 1-2x/week   PT DURATION: 12 weeks   PLANNED INTERVENTIONS: 97164- PT Re-evaluation, 97750- Physical Performance Testing, 97110-Therapeutic exercises, 97530- Therapeutic activity, 97112- Neuromuscular re-education, 97535- Self Care, [id number]- Manual therapy, [phone]- Gait training, [phone]- Electrical stimulation (unattended), [phone]- Electrical stimulation (manual), Patient/Family education, Balance training, Stair training, Joint mobilization, Joint manipulation, Cryotherapy, and Moist heat  PLAN FOR NEXT SESSION:  Continue with balance training Continue with UE exercises to improve overall ROM and mobility, along with strengthening FU on New HEP items from last visit  Continue plan  1:37 PM, 11/27/24 Peggye JAYSON Linear, PT, DPT Physical Therapist - [facility]  Outpatient Physical Therapy- Main Campus [phone]       "

## 2024-12-02 ENCOUNTER — Encounter: Admitting: Dermatology

## 2024-12-02 ENCOUNTER — Ambulatory Visit

## 2024-12-03 ENCOUNTER — Ambulatory Visit

## 2024-12-04 ENCOUNTER — Ambulatory Visit

## 2024-12-09 ENCOUNTER — Ambulatory Visit: Admitting: Physical Therapy

## 2024-12-10 ENCOUNTER — Ambulatory Visit

## 2024-12-11 ENCOUNTER — Ambulatory Visit: Admitting: Physical Therapy

## 2024-12-16 ENCOUNTER — Ambulatory Visit: Admitting: Physical Therapy

## 2024-12-17 ENCOUNTER — Ambulatory Visit

## 2024-12-18 ENCOUNTER — Ambulatory Visit

## 2024-12-23 ENCOUNTER — Ambulatory Visit: Admitting: Physical Therapy

## 2024-12-24 ENCOUNTER — Ambulatory Visit

## 2024-12-25 ENCOUNTER — Ambulatory Visit

## 2024-12-30 ENCOUNTER — Ambulatory Visit: Admitting: Physical Therapy

## 2024-12-31 ENCOUNTER — Ambulatory Visit

## 2025-01-01 ENCOUNTER — Ambulatory Visit

## 2025-01-06 ENCOUNTER — Ambulatory Visit: Admitting: Physical Therapy

## 2025-01-07 ENCOUNTER — Ambulatory Visit

## 2025-01-08 ENCOUNTER — Ambulatory Visit

## 2025-02-05 ENCOUNTER — Other Ambulatory Visit

## 2025-02-13 ENCOUNTER — Ambulatory Visit: Admitting: Oncology
